# Patient Record
Sex: Female | Born: 1960 | Race: Black or African American | Hispanic: No | Marital: Single | Smoking: Never smoker
Health system: Southern US, Community
[De-identification: ages and names within clinical notes are randomized; demographics above are authoritative.]

## PROBLEM LIST (undated history)

## (undated) DIAGNOSIS — M5090 Cervical disc disorder, unspecified, unspecified cervical region: Secondary | ICD-10-CM

## (undated) DIAGNOSIS — M519 Unspecified thoracic, thoracolumbar and lumbosacral intervertebral disc disorder: Secondary | ICD-10-CM

## (undated) DIAGNOSIS — D649 Anemia, unspecified: Secondary | ICD-10-CM

## (undated) DIAGNOSIS — N185 Chronic kidney disease, stage 5: Secondary | ICD-10-CM

## (undated) DIAGNOSIS — Z992 Dependence on renal dialysis: Secondary | ICD-10-CM

## (undated) DIAGNOSIS — B182 Chronic viral hepatitis C: Secondary | ICD-10-CM

## (undated) DIAGNOSIS — I10 Essential (primary) hypertension: Secondary | ICD-10-CM

## (undated) DIAGNOSIS — N289 Disorder of kidney and ureter, unspecified: Secondary | ICD-10-CM

## (undated) HISTORY — PX: TUBAL LIGATION: SHX77

## (undated) HISTORY — DX: Chronic kidney disease, stage 5: N18.5

## (undated) HISTORY — DX: Cervical disc disorder, unspecified, unspecified cervical region: M50.90

## (undated) HISTORY — DX: Dependence on renal dialysis: Z99.2

## (undated) HISTORY — DX: Unspecified thoracic, thoracolumbar and lumbosacral intervertebral disc disorder: M51.9

---

## 1997-11-03 ENCOUNTER — Other Ambulatory Visit: Admission: RE | Admit: 1997-11-03 | Discharge: 1997-11-03 | Payer: Self-pay | Admitting: *Deleted

## 1998-05-09 ENCOUNTER — Other Ambulatory Visit: Admission: RE | Admit: 1998-05-09 | Discharge: 1998-05-09 | Payer: Self-pay | Admitting: Internal Medicine

## 1999-07-16 ENCOUNTER — Emergency Department (HOSPITAL_COMMUNITY): Admission: EM | Admit: 1999-07-16 | Discharge: 1999-07-16 | Payer: Self-pay | Admitting: Emergency Medicine

## 2000-05-07 ENCOUNTER — Inpatient Hospital Stay (HOSPITAL_COMMUNITY): Admission: AD | Admit: 2000-05-07 | Discharge: 2000-05-07 | Payer: Self-pay | Admitting: *Deleted

## 2001-03-02 ENCOUNTER — Emergency Department (HOSPITAL_COMMUNITY): Admission: EM | Admit: 2001-03-02 | Discharge: 2001-03-02 | Payer: Self-pay | Admitting: Emergency Medicine

## 2003-07-30 ENCOUNTER — Emergency Department (HOSPITAL_COMMUNITY): Admission: AD | Admit: 2003-07-30 | Discharge: 2003-07-30 | Payer: Self-pay | Admitting: Family Medicine

## 2003-08-02 ENCOUNTER — Emergency Department (HOSPITAL_COMMUNITY): Admission: AD | Admit: 2003-08-02 | Discharge: 2003-08-02 | Payer: Self-pay | Admitting: Family Medicine

## 2003-08-23 ENCOUNTER — Emergency Department (HOSPITAL_COMMUNITY): Admission: EM | Admit: 2003-08-23 | Discharge: 2003-08-23 | Payer: Self-pay | Admitting: Family Medicine

## 2004-03-10 ENCOUNTER — Emergency Department (HOSPITAL_COMMUNITY): Admission: EM | Admit: 2004-03-10 | Discharge: 2004-03-10 | Payer: Self-pay | Admitting: Emergency Medicine

## 2005-02-02 ENCOUNTER — Emergency Department (HOSPITAL_COMMUNITY): Admission: EM | Admit: 2005-02-02 | Discharge: 2005-02-02 | Payer: Self-pay | Admitting: Family Medicine

## 2005-03-01 ENCOUNTER — Encounter: Admission: RE | Admit: 2005-03-01 | Discharge: 2005-03-01 | Payer: Self-pay | Admitting: Nephrology

## 2005-03-01 ENCOUNTER — Other Ambulatory Visit: Admission: RE | Admit: 2005-03-01 | Discharge: 2005-03-01 | Payer: Self-pay | Admitting: Nephrology

## 2005-03-01 ENCOUNTER — Other Ambulatory Visit: Admission: RE | Admit: 2005-03-01 | Discharge: 2005-03-01 | Payer: Self-pay | Admitting: Cardiology

## 2005-07-26 ENCOUNTER — Encounter: Admission: RE | Admit: 2005-07-26 | Discharge: 2005-07-26 | Payer: Self-pay | Admitting: Nephrology

## 2005-08-02 ENCOUNTER — Encounter: Admission: RE | Admit: 2005-08-02 | Discharge: 2005-08-02 | Payer: Self-pay | Admitting: Nephrology

## 2006-04-10 ENCOUNTER — Encounter: Admission: RE | Admit: 2006-04-10 | Discharge: 2006-04-10 | Payer: Self-pay | Admitting: Internal Medicine

## 2006-04-24 ENCOUNTER — Encounter: Admission: RE | Admit: 2006-04-24 | Discharge: 2006-04-24 | Payer: Self-pay | Admitting: Internal Medicine

## 2007-02-25 ENCOUNTER — Encounter: Admission: RE | Admit: 2007-02-25 | Discharge: 2007-02-25 | Payer: Self-pay | Admitting: Internal Medicine

## 2007-03-08 ENCOUNTER — Emergency Department (HOSPITAL_COMMUNITY): Admission: EM | Admit: 2007-03-08 | Discharge: 2007-03-08 | Payer: Self-pay | Admitting: Family Medicine

## 2007-03-21 ENCOUNTER — Emergency Department (HOSPITAL_COMMUNITY): Admission: EM | Admit: 2007-03-21 | Discharge: 2007-03-21 | Payer: Self-pay | Admitting: Family Medicine

## 2007-09-22 ENCOUNTER — Emergency Department (HOSPITAL_COMMUNITY): Admission: EM | Admit: 2007-09-22 | Discharge: 2007-09-22 | Payer: Self-pay | Admitting: Family Medicine

## 2008-05-13 ENCOUNTER — Encounter: Admission: RE | Admit: 2008-05-13 | Discharge: 2008-05-13 | Payer: Self-pay | Admitting: Internal Medicine

## 2008-06-29 ENCOUNTER — Ambulatory Visit (HOSPITAL_COMMUNITY): Admission: RE | Admit: 2008-06-29 | Discharge: 2008-06-29 | Payer: Self-pay | Admitting: Obstetrics & Gynecology

## 2008-11-06 ENCOUNTER — Emergency Department (HOSPITAL_COMMUNITY): Admission: EM | Admit: 2008-11-06 | Discharge: 2008-11-06 | Payer: Self-pay | Admitting: Emergency Medicine

## 2008-11-30 ENCOUNTER — Encounter: Admission: RE | Admit: 2008-11-30 | Discharge: 2008-11-30 | Payer: Self-pay | Admitting: Internal Medicine

## 2009-06-03 ENCOUNTER — Encounter: Admission: RE | Admit: 2009-06-03 | Discharge: 2009-06-03 | Payer: Self-pay | Admitting: Internal Medicine

## 2010-04-21 ENCOUNTER — Encounter: Admission: RE | Admit: 2010-04-21 | Discharge: 2010-04-21 | Payer: Self-pay | Admitting: Family Medicine

## 2010-08-11 ENCOUNTER — Encounter
Admission: RE | Admit: 2010-08-11 | Discharge: 2010-08-11 | Payer: Self-pay | Source: Home / Self Care | Attending: Family Medicine | Admitting: Family Medicine

## 2010-08-13 ENCOUNTER — Encounter: Payer: Self-pay | Admitting: Internal Medicine

## 2010-08-31 ENCOUNTER — Ambulatory Visit: Payer: Self-pay | Admitting: Gastroenterology

## 2010-08-31 DIAGNOSIS — B182 Chronic viral hepatitis C: Secondary | ICD-10-CM

## 2010-09-01 ENCOUNTER — Other Ambulatory Visit: Payer: Self-pay | Admitting: Gastroenterology

## 2010-09-01 DIAGNOSIS — B1920 Unspecified viral hepatitis C without hepatic coma: Secondary | ICD-10-CM

## 2010-09-18 ENCOUNTER — Ambulatory Visit (HOSPITAL_COMMUNITY)
Admission: RE | Admit: 2010-09-18 | Discharge: 2010-09-18 | Disposition: A | Discharge status: Home / Self Care | Payer: Medicaid Other | Source: Ambulatory Visit | Attending: Gastroenterology | Admitting: Gastroenterology

## 2010-09-18 ENCOUNTER — Other Ambulatory Visit: Payer: Self-pay | Admitting: Interventional Radiology

## 2010-09-18 ENCOUNTER — Ambulatory Visit (HOSPITAL_COMMUNITY): Payer: Medicaid Other | Attending: Gastroenterology

## 2010-09-18 DIAGNOSIS — B1920 Unspecified viral hepatitis C without hepatic coma: Secondary | ICD-10-CM

## 2010-09-18 DIAGNOSIS — B182 Chronic viral hepatitis C: Secondary | ICD-10-CM | POA: Insufficient documentation

## 2010-09-18 LAB — CBC
HCT: 30.3 % — ABNORMAL LOW (ref 36.0–46.0)
MCH: 25.1 pg — ABNORMAL LOW (ref 26.0–34.0)
Platelets: 491 10*3/uL — ABNORMAL HIGH (ref 150–400)
RBC: 3.71 MIL/uL — ABNORMAL LOW (ref 3.87–5.11)
WBC: 7.5 10*3/uL (ref 4.0–10.5)

## 2010-09-18 LAB — PROTIME-INR: INR: 0.95 (ref 0.00–1.49)

## 2010-11-11 ENCOUNTER — Emergency Department (HOSPITAL_COMMUNITY): Admission: EM | Admit: 2010-11-11 | Payer: Self-pay | Source: Home / Self Care

## 2010-11-12 ENCOUNTER — Emergency Department (HOSPITAL_COMMUNITY)
Admission: EM | Admit: 2010-11-12 | Discharge: 2010-11-12 | Disposition: A | Discharge status: Home / Self Care | Payer: Medicaid Other | Attending: Emergency Medicine | Admitting: Emergency Medicine

## 2010-11-12 DIAGNOSIS — R3589 Other polyuria: Secondary | ICD-10-CM | POA: Insufficient documentation

## 2010-11-12 DIAGNOSIS — R631 Polydipsia: Secondary | ICD-10-CM | POA: Insufficient documentation

## 2010-11-12 DIAGNOSIS — H538 Other visual disturbances: Secondary | ICD-10-CM | POA: Insufficient documentation

## 2010-11-12 DIAGNOSIS — R358 Other polyuria: Secondary | ICD-10-CM | POA: Insufficient documentation

## 2010-11-12 DIAGNOSIS — I10 Essential (primary) hypertension: Secondary | ICD-10-CM | POA: Insufficient documentation

## 2010-11-12 DIAGNOSIS — R5383 Other fatigue: Secondary | ICD-10-CM | POA: Insufficient documentation

## 2010-11-12 DIAGNOSIS — E119 Type 2 diabetes mellitus without complications: Secondary | ICD-10-CM | POA: Insufficient documentation

## 2010-11-12 DIAGNOSIS — B1920 Unspecified viral hepatitis C without hepatic coma: Secondary | ICD-10-CM | POA: Insufficient documentation

## 2010-11-12 DIAGNOSIS — R5381 Other malaise: Secondary | ICD-10-CM | POA: Insufficient documentation

## 2010-11-12 LAB — COMPREHENSIVE METABOLIC PANEL
ALT: 44 U/L — ABNORMAL HIGH (ref 0–35)
AST: 48 U/L — ABNORMAL HIGH (ref 0–37)
Alkaline Phosphatase: 65 U/L (ref 39–117)
CO2: 25 mEq/L (ref 19–32)
Calcium: 9.5 mg/dL (ref 8.4–10.5)
Chloride: 91 mEq/L — ABNORMAL LOW (ref 96–112)
GFR calc Af Amer: 48 mL/min — ABNORMAL LOW (ref >60)
GFR calc non Af Amer: 39 mL/min — ABNORMAL LOW (ref >60)
Glucose, Bld: 734 mg/dL (ref 70–99)
Potassium: 3.8 mEq/L (ref 3.5–5.1)
Sodium: 128 mEq/L — ABNORMAL LOW (ref 135–145)

## 2010-11-12 LAB — URINALYSIS, ROUTINE W REFLEX MICROSCOPIC
Glucose, UA: >1000 mg/dL — AB
Leukocytes, UA: NEGATIVE
Protein, ur: 100 mg/dL — AB
pH: 6 (ref 5.0–8.0)

## 2010-11-12 LAB — DIFFERENTIAL
Basophils Absolute: 0.1 10*3/uL (ref 0.0–0.1)
Basophils Relative: 1 % (ref 0–1)
Lymphocytes Relative: 33 % (ref 12–46)
Monocytes Absolute: 0.5 10*3/uL (ref 0.1–1.0)
Neutro Abs: 3.5 10*3/uL (ref 1.7–7.7)
Neutrophils Relative %: 53 % (ref 43–77)

## 2010-11-12 LAB — GLUCOSE, CAPILLARY
Glucose-Capillary: 384 mg/dL — ABNORMAL HIGH (ref 70–99)
Glucose-Capillary: 303 mg/dL — ABNORMAL HIGH (ref 70–99)

## 2010-11-12 LAB — BLOOD GAS, VENOUS
Drawn by: 33686
O2 Saturation: 72.2 %
TCO2: 23.7 mmol/L (ref 0–100)
pCO2, Ven: 48 mmHg (ref 45.0–50.0)
pH, Ven: 7.348 — ABNORMAL HIGH (ref 7.250–7.300)

## 2010-11-12 LAB — CBC
HCT: 34.5 % — ABNORMAL LOW (ref 36.0–46.0)
Hemoglobin: 11.1 g/dL — ABNORMAL LOW (ref 12.0–15.0)
MCHC: 32.2 g/dL (ref 30.0–36.0)
RBC: 4.32 MIL/uL (ref 3.87–5.11)
WBC: 6.6 10*3/uL (ref 4.0–10.5)

## 2010-11-12 LAB — URINE MICROSCOPIC-ADD ON

## 2010-11-16 ENCOUNTER — Emergency Department (HOSPITAL_COMMUNITY)
Admission: EM | Admit: 2010-11-16 | Discharge: 2010-11-16 | Disposition: A | Discharge status: Home / Self Care | Payer: Medicaid Other | Attending: Emergency Medicine | Admitting: Emergency Medicine

## 2010-11-16 DIAGNOSIS — E119 Type 2 diabetes mellitus without complications: Secondary | ICD-10-CM | POA: Insufficient documentation

## 2010-11-16 DIAGNOSIS — I10 Essential (primary) hypertension: Secondary | ICD-10-CM | POA: Insufficient documentation

## 2010-11-16 DIAGNOSIS — Z8619 Personal history of other infectious and parasitic diseases: Secondary | ICD-10-CM | POA: Insufficient documentation

## 2010-11-16 LAB — BASIC METABOLIC PANEL
CO2: 27 mEq/L (ref 19–32)
Calcium: 9.7 mg/dL (ref 8.4–10.5)
Chloride: 90 mEq/L — ABNORMAL LOW (ref 96–112)
Potassium: 5.1 mEq/L (ref 3.5–5.1)
Sodium: 127 mEq/L — ABNORMAL LOW (ref 135–145)

## 2010-11-16 LAB — HEPATIC FUNCTION PANEL
Albumin: 3.4 g/dL — ABNORMAL LOW (ref 3.5–5.2)
Alkaline Phosphatase: 56 U/L (ref 39–117)
Bilirubin, Direct: <0.1 mg/dL (ref 0.0–0.3)
Total Bilirubin: 0.4 mg/dL (ref 0.3–1.2)

## 2010-11-16 LAB — GLUCOSE, CAPILLARY
Glucose-Capillary: >600 mg/dL (ref 70–99)
Glucose-Capillary: 422 mg/dL — ABNORMAL HIGH (ref 70–99)

## 2010-11-16 LAB — URINALYSIS, ROUTINE W REFLEX MICROSCOPIC
Hgb urine dipstick: NEGATIVE
Specific Gravity, Urine: 1.028 (ref 1.005–1.030)

## 2010-11-16 LAB — URINE MICROSCOPIC-ADD ON

## 2011-03-22 ENCOUNTER — Ambulatory Visit (INDEPENDENT_AMBULATORY_CARE_PROVIDER_SITE_OTHER): Payer: Self-pay | Admitting: Gastroenterology

## 2011-03-22 VITALS — BP 138/94 | HR 89 | Temp 99.5°F | Ht 64.0 in | Wt 166.0 lb

## 2011-03-22 DIAGNOSIS — B182 Chronic viral hepatitis C: Secondary | ICD-10-CM

## 2011-04-05 NOTE — Progress Notes (Signed)
NAMEMarland Kitchen  Maureen, Barnes  MR#:  [id number]      DATE:  03/22/2011  DOB:  10/18/1960    cc: Consulting Physician:  [facility], [address], [place], [place] [address], Fax [phone]  Primary Care Physician:  Wenda Low, MD, [facility] at [place], [address], [address], [place], [place] [address], [place] [phone]  Referring Physician:  Vicenta Aly, [place], [facility], [address], [place], [place], [place] [address], Fax [phone]    REASON FOR VISIT:  Follow up of genotype 1a hepatitis C.   History:  The patient returns today accompanied by her son. It will be recalled that when I saw her on 08/31/2010, she had genotype 1a hepatitis C. Biopsy on 09/18/2010, showed grade 2, stage I disease. Treatment had  been delayed because of a finding of a normocytic but hypochromic anemia. I suggested this be investigated by her primary physician at the time, Vicenta Aly. Today, the patient reports this was never  done. She is now switched to, Dr. Lysle Rubens at [facility]. She brings with her today a patient encounter summary from 03/06/2011, at which time CBC was checked, and she was suppose to be on iron 325 mg p.o. b.i.d. at the time. We do not have that CBC as of yet. In terms of the etiology, the patient acknowledges a history of menorrhagia. She is due to be seen at the [facility] for her gynecologic care at which time she can discuss the menorrhagia with them.  There are currently no symptoms referable to her history of hepatitis C. There are no symptoms to suggest cryoglobulin mediated or decompensated liver disease.   PAST MEDICAL HISTORY:  Other than the anemia, the patient reports that she has been diagnosed with diabetes since last being seen. She has been started on Lantus  insulin. She reports she checks her blood sugars twice daily and they range between 80 and 100 fasting.    CURRENT MEDICATIONS:  Nasonex 2 sprays in each nostril once daily, Clarinex 5 mg p.o. daily, Lantus insulin 25-50 units subcu daily depending on blood sugars, Diovan HCT 320/25 mg p.o. daily, amlodipine 10 mg p.o. daily,  cyclobenzaprine 5 mg p.o. at bedtime, iron 325 mg (365 mg elemental iron) b.i.d., ibuprofen 200 mg p.o. q. 6 hours p.r.n.   ALLERGIES:  Iodine and shellfish with shellfish causing hives.   HABITS:  Smoking, quit over 2-1/2 years ago. Alcohol denies interval consumption.   REVIEW OF SYSTEMS:  All 10 systems reviewed today with the patient and they are negative other which mentioned above. Her CES-D was 34.   PHYSICAL EXAMINATION:  Constitutional: Well-appearing. Vital signs: Height 64 inches, weight 166 pounds, down from 180 pounds on 08/31/2010. Blood pressure 138/94, pulse of 89, temperature 99.5 Fahrenheit.  Ears, nose, mouth and  throat:  Unremarkable oropharynx.  No thyromegaly or neck masses.  Chest:  Resonant to percussion.  Clear to auscultation.  Cardiovascular:  Heart sounds normal S1, S2 without murmurs or rubs.   There is no peripheral edema.  Abdominal:  Normal bowel sounds.  No masses or tenderness.  I could not appreciate a liver edge or spleen tip.  I could not appreciate any hernias. There was slight amount of  right upper quadrant tenderness. There was no rebound or guarding, and Murphy sign was negative.  Lymphatics:  No cervical or inguinal lymphadenopathy.  Central Nervous System:  No asterixis or focal  neurologic findings.  Dermatologic:  Anicteric without palmar erythema or spider angiomata.  Eyes:  Anicteric sclerae.  Pupils are equal and reactive to light.   LABORATORY STUDIES:  From 03/06/2011, faxed after clinic appointment showed a white count of 5.9, hemoglobin 9.6, MCV 87.2, MCH 28, MCHC 32, and a platelet count of 381, creatinine was 1.17, albumin 3.7. ALT 246, AST 57, ALP  41, total bilirubin 0.3. Her triglycerides were 113. Urine  microalbumin to creatinine ratio was 1100.   ASSESSMENT:  The patient is a 50 year old woman with a history of genotype 1a hepatitis C with a biopsy on 09/18/2010, showing grade 2, stage I disease. She is a good candidate for treatment with the exception of  the anemia, which is now more of a normocytic normochromic anemia, though she reports a history of menorrhagia.  I cannot treat her for hepatitis C without  resolution of this because there is a risk of profound anemia from the antivirals that I will be using to treat her hepatitis C particular the protease inhibitor. If the evaluation by gynecology turns out to  be negative she will need a colonoscopy. I do not do colonoscopies in [place] as I only see viral hepatitis patients in [place]. She would need to be referred to [facility] for an endoscopy or I can do  this done in [facility], if requested by her primary physician.  In my discussion today with the patient, I have discussed her previous lab results including her biopsy findings. We discussed the implications of this. We then discussed treatment with triple therapy  of PEG interferon, ribavirin, and a protease inhibitor. I have explained to her why I would need to have her anemia resolved before we can go forward. She understood this. I have also explained to her  that if she sees gynecology and there is a discussion about using hormonal therapy to manipulate her menstrual bleeding, there would be no restriction on what they can choose based on her liver disease.   PLAN:  1. She is to return in approximately 2-4 month's time in follow up to track her progress in terms of her other comorbidities, and see if we can consider treating her for hepatitis C. 2. She will need hepatitis A vaccination through her primary. 3. Hepatitis B immune.            Marty Heck, MD   [phone]  D:  Thu Aug 30 18:30:47 2012 ; T:  Thu Aug 30 21:23:36 2012  Job #:  [id number]

## 2011-05-03 ENCOUNTER — Other Ambulatory Visit (HOSPITAL_COMMUNITY): Payer: Self-pay | Admitting: Internal Medicine

## 2011-05-03 DIAGNOSIS — R51 Headache: Secondary | ICD-10-CM

## 2011-05-03 DIAGNOSIS — J3489 Other specified disorders of nose and nasal sinuses: Secondary | ICD-10-CM

## 2011-05-04 ENCOUNTER — Ambulatory Visit (HOSPITAL_COMMUNITY)
Admission: RE | Admit: 2011-05-04 | Discharge: 2011-05-04 | Disposition: A | Discharge status: Home / Self Care | Payer: Self-pay | Source: Ambulatory Visit | Attending: Internal Medicine | Admitting: Internal Medicine

## 2011-05-04 DIAGNOSIS — R51 Headache: Secondary | ICD-10-CM | POA: Insufficient documentation

## 2011-05-04 DIAGNOSIS — J3489 Other specified disorders of nose and nasal sinuses: Secondary | ICD-10-CM

## 2011-05-04 LAB — WET PREP, GENITAL
Trich, Wet Prep: NONE SEEN
Yeast Wet Prep HPF POC: NONE SEEN

## 2011-05-04 LAB — POCT URINALYSIS DIP (DEVICE)
Glucose, UA: NEGATIVE
Ketones, ur: NEGATIVE
Operator id: 282151
Specific Gravity, Urine: >=1.03

## 2011-05-04 LAB — POCT PREGNANCY, URINE
Operator id: 282151
Preg Test, Ur: NEGATIVE

## 2011-05-04 LAB — URINE CULTURE: Culture: NO GROWTH

## 2011-05-04 LAB — GC/CHLAMYDIA PROBE AMP, GENITAL: GC Probe Amp, Genital: NEGATIVE

## 2011-06-28 ENCOUNTER — Ambulatory Visit (INDEPENDENT_AMBULATORY_CARE_PROVIDER_SITE_OTHER): Payer: Self-pay | Admitting: Gastroenterology

## 2011-06-28 DIAGNOSIS — B182 Chronic viral hepatitis C: Secondary | ICD-10-CM

## 2011-06-28 MED ORDER — BOCEPREVIR 200 MG PO CAPS: 800.0000 mg | ORAL_CAPSULE | Freq: Three times a day (TID) | ORAL | Status: DC

## 2011-06-29 LAB — HEPATIC FUNCTION PANEL
Albumin: 3.6 g/dL (ref 3.5–5.2)
Total Bilirubin: 0.3 mg/dL (ref 0.3–1.2)
Total Protein: 6.9 g/dL (ref 6.0–8.3)

## 2011-06-29 LAB — CBC WITH DIFFERENTIAL/PLATELET
Basophils Absolute: 0.1 10*3/uL (ref 0.0–0.1)
Basophils Relative: 1 % (ref 0–1)
HCT: 36.4 % (ref 36.0–46.0)
Hemoglobin: 11.5 g/dL — ABNORMAL LOW (ref 12.0–15.0)
Lymphocytes Relative: 31 % (ref 12–46)
MCHC: 31.6 g/dL (ref 30.0–36.0)
Monocytes Absolute: 0.5 10*3/uL (ref 0.1–1.0)
Monocytes Relative: 7 % (ref 3–12)
Neutro Abs: 4 10*3/uL (ref 1.7–7.7)
Neutrophils Relative %: 53 % (ref 43–77)
RDW: 15.2 % (ref 11.5–15.5)
WBC: 7.5 10*3/uL (ref 4.0–10.5)

## 2011-07-01 ENCOUNTER — Emergency Department (HOSPITAL_COMMUNITY)
Admission: EM | Admit: 2011-07-01 | Discharge: 2011-07-01 | Disposition: A | Discharge status: Home / Self Care | Payer: Self-pay | Source: Home / Self Care | Attending: Family Medicine | Admitting: Family Medicine

## 2011-07-01 ENCOUNTER — Encounter: Payer: Self-pay | Admitting: Cardiology

## 2011-07-01 DIAGNOSIS — B9689 Other specified bacterial agents as the cause of diseases classified elsewhere: Secondary | ICD-10-CM

## 2011-07-01 DIAGNOSIS — N760 Acute vaginitis: Secondary | ICD-10-CM

## 2011-07-01 DIAGNOSIS — A499 Bacterial infection, unspecified: Secondary | ICD-10-CM

## 2011-07-01 HISTORY — DX: Chronic viral hepatitis C: B18.2

## 2011-07-01 HISTORY — DX: Anemia, unspecified: D64.9

## 2011-07-01 HISTORY — DX: Essential (primary) hypertension: I10

## 2011-07-01 LAB — WET PREP, GENITAL
Trich, Wet Prep: NONE SEEN
Yeast Wet Prep HPF POC: NONE SEEN

## 2011-07-01 LAB — POCT URINALYSIS DIP (DEVICE)
Bilirubin Urine: NEGATIVE
Ketones, ur: NEGATIVE mg/dL
Leukocytes, UA: NEGATIVE
Specific Gravity, Urine: 1.025 (ref 1.005–1.030)

## 2011-07-01 MED ORDER — METRONIDAZOLE 0.75 % VA GEL: VAGINAL | Status: DC

## 2011-07-01 NOTE — ED Notes (Signed)
Pt reports frequency, urgency and burning on urination for the past 3 weeks. Denies fever. Denies nausea/vomiting. Lower abd pain. Vaginal discharge yellow/brown milky type.

## 2011-07-01 NOTE — ED Provider Notes (Signed)
History     CSN: [id number] Arrival date & time: 07/01/2011 12:40 PM   First MD Initiated Contact with Patient 07/01/11 1158      Chief Complaint  Patient presents with  . Urinary Tract Infection    (Consider location/radiation/quality/duration/timing/severity/associated sxs/prior treatment) Patient is a 50 y.o. female presenting with frequency.  Urinary Frequency This is a new problem. The current episode started more than 1 week ago. The problem occurs constantly. The problem has not changed since onset.Associated symptoms comments: Vag disch.    Past Medical History  Diagnosis Date  . Diabetes mellitus   . Hypertension   . Anemia   . Hep C w/o coma, chronic     History reviewed. No pertinent past surgical history.  History reviewed. No pertinent family history.  History  Substance Use Topics  . Smoking status: Never Smoker   . Smokeless tobacco: Not on file  . Alcohol Use: No    OB History    Grav Para Term Preterm Abortions TAB SAB Ect Mult Living                  Review of Systems  Constitutional: Negative.   Gastrointestinal: Negative.   Genitourinary: Positive for dysuria, urgency, frequency and vaginal discharge.    Allergies  Iodine  Home Medications   Current Outpatient Rx  Name Route Sig Dispense Refill  . AMLODIPINE BESYLATE 10 MG PO TABS Oral Take 10 mg by mouth daily.      Marland Kitchen LANTUS SOLOSTAR [place] Subcutaneous Inject 31 Units into the skin daily.     Marland Kitchen LORATADINE 10 MG PO TABS Oral Take 10 mg by mouth daily.      Marland Kitchen VALSARTAN-HYDROCHLOROTHIAZIDE 320-25 MG PO TABS Oral Take 1 tablet by mouth daily.      Marland Kitchen BOCEPREVIR 200 MG PO CAPS Oral Take 800 mg by mouth every 8 (eight) hours. 360 capsule 7  . METRONIDAZOLE A999333 % VA GEL  1 applic intravag qhs for 5 nights 70 g 0    BP 137/90  Pulse 89  Temp(Src) 98.1 F (36.7 C) (Oral)  Resp 18  SpO2 98%  LMP 06/01/2011  Physical Exam  Nursing note and vitals reviewed. Constitutional: She appears  well-developed and well-nourished.  Abdominal: Soft. Bowel sounds are normal.  Genitourinary: Uterus is deviated. Uterus is not tender. Cervix exhibits discharge. Cervix exhibits no motion tenderness. Right adnexum displays no mass and no tenderness. Left adnexum displays no mass and no tenderness. No erythema, tenderness or bleeding around the vagina. No signs of injury around the vagina. Vaginal discharge found.    ED Course  Procedures (including critical care time)  Labs Reviewed  POCT URINALYSIS DIP (DEVICE) - Abnormal; Notable for the following:    Protein, ur 100 (*)    All other components within normal limits  POCT URINALYSIS DIPSTICK   No results found.   1. Bacterial vaginosis       MDM  U/a neg.        Pauline Good, MD 07/01/11 [phone]

## 2011-07-02 LAB — GC/CHLAMYDIA PROBE AMP, GENITAL
Chlamydia, DNA Probe: NEGATIVE
GC Probe Amp, Genital: NEGATIVE

## 2011-07-02 NOTE — ED Notes (Signed)
Labs and medications for 12/9 reviewed. Pt. adequately treated with Metrogel for clue cells. GC/Chlamydia pending. Roselyn Meier 07/02/2011

## 2011-07-03 NOTE — ED Notes (Signed)
GC/Chlamydia neg.  No further action needed. Maureen Barnes 07/03/2011

## 2011-07-04 LAB — HEPATITIS C RNA QUANTITATIVE
HCV Quantitative Log: 5.87 {Log} — ABNORMAL HIGH (ref <1.63)
HCV Quantitative: 736000 IU/mL — ABNORMAL HIGH (ref <43)

## 2011-07-05 NOTE — Progress Notes (Signed)
NAMEMarland Kitchen  Maureen, Barnes  MR#:  [id number]      DATE:  06/28/2011  DOB:  12-28-1960    cc: Consulting Physician:  [facility], [address], [place], [place] [address], Fax [phone] Primary Care Physician:  Namon Cirri, MD, [facility] at [place], [address], [place], [place], [place] [address], [place] [phone] Referring Physician:  Vicenta Aly, [place], [facility], [address], [place], [place], [place] [address], Fax [phone]    REASON FOR VISIT:  Follow up of genotype 1a hepatitis C, IL 28 b CT.   History:  The patient returns today unaccompanied. Since last being seen on 03/22/2011, it will be recalled that treatment for hepatitis C was held up because of the finding of anemia. It was attributed to iron  deficiency from menorrhagia. The patient reports that she is on iron supplement, as well as a multivitamin with improvement in her hemoglobin. She believes that around 06/02/2011, her hemoglobin was  11, although her menorrhagia continues. Otherwise, there are no symptoms referable to her history of hepatitis C nor symptoms to suggest cryoglobulin mediated or decompensated liver disease.   Past MEDICAL HISTORY:  Otherwise significant for type 2 diabetes. She is maintained on Lantus insulin. Checks her blood sugars twice daily. Her blood sugar usually range between 800 to 100 first thing in the morning.   CURRENT MEDICATIONS:  Lantus insulin 31 units subcu a.m., Diovan/hydrochlorothiazide 320/25 mg daily, amlodipine 10 mg daily, cyclobenzaprine 5 mg at bedtime,  iron 325 mg b.i.d., ibuprofen 200 mg q. 6 hours p.r.n., Nasonex to 2 sprays into each nostril daily, Clarinex 5 mg daily.   ALLERGIES:  Iodine and shellfish, which shellfish causes hives.   HABITS:  Smoking, quit over 3 years ago. Alcohol, denies interval consumption.   REVIEW OF SYSTEMS:  All 10 systems reviewed today with the  patient and they are negative other than which is mentioned above. CES-D was 26.   PHYSICAL EXAMINATION:   Constitutional:  Well-appearing without significant peripheral wasting or stigmata of chronic liver disease. Vital signs: Height 64  inches, weight 169 pounds, blood pressure 138/94, pulse of 86, temperature 98.5 Fahrenheit.   Laboratories:  I have not received any lab work since last being seen.   ASSESSMENT:  Patient is a 50 year old woman with history of genotype 1a hepatitis C, IL 28 b CT with a biopsy in February 2005 showing grade 2 stage I disease. I had previously thought she was a good candidate for  treatment with exception of anemia. If this has truly resolved, we can proceed with treatment. Perhaps though if her hemoglobin is around 11, boceprevir would be a better choice because of less anemia.  In my discussion today with the patient, we discussed her previous lab testing, and her reports that the anemia may have resolved to the point we can start her on treatment. We discussed treatment with  pegylated interferon, ribavirin, and telaprevir versus boceprevir weighing the risks and benefit of each. We discussed the possible use of boceprevir. Because of her lack of insurance, I have explained to  her that she must complete the patient assistance forms for PEG interferon, and ribavirin, and boceprevir and could take over month  for reply from accompanies that adjudicate these applications for free medications.   PLAN:  1. Check CBC, as well as HCV RNA and liver enzymes today. 2. Hepatitis A naive, she should be vaccinated through her primary physician,  and hepatitis B immune. 3. She has completed her sections of the applications for medication assistance for boceprevir, Pegasys, and ribavirin. I will complete my section and fax these off. 4. She was instructed that if approved, once medications are delivered to her home she is to contact us for an appointment for teaching.              Marty Heck, MD  ADDENDUM HgB 11.5   HCV RNA 736000 IU/mL Victrelis approved.  Pegasys/RBV pending.   [place]  D:  Thu Dec 06 16:28:23 2012 ; T:  Thu Dec 06 17:08:16 2012  Job #:  [id number]

## 2011-07-12 ENCOUNTER — Telehealth: Payer: Self-pay | Admitting: Gastroenterology

## 2011-07-12 ENCOUNTER — Ambulatory Visit (INDEPENDENT_AMBULATORY_CARE_PROVIDER_SITE_OTHER): Payer: Self-pay | Admitting: Gastroenterology

## 2011-07-12 DIAGNOSIS — B182 Chronic viral hepatitis C: Secondary | ICD-10-CM

## 2011-07-19 NOTE — Progress Notes (Signed)
NAMEMarland Kitchen  Maureen Barnes, Maureen Barnes  MR#:  [id number]      DATE:  07/12/2011  DOB:  20-Nov-1960    cc: Consulting Physician:  [facility], [address], [place], [place] [address], Fax [phone] Primary Care Physician:  Namon Cirri, MD, [facility] at [place],  [place], [address], [place], [address] [address], [place] [phone] Referring Physician:  Nonda Lou, FNP, [facility], [place], [place], [place],  [address], Fax [phone]    REASON FOR VISIT:  Follow up of genotype 1a hepatitis C, IL 28b CT.   History:  The patient returns today accompanied by her family. She comes in today for teaching to commence on Pegasys, ribavirin, and boceprevir. Since last being seen the patient has not had any new symptoms  referable to her history of hepatitis C. There are no symptoms to suggest cryoglobulin mediated or decompensated liver disease. Her  periods are unchanged from before, which is thought to be the source of her anemia.   PAST MEDICAL HISTORY:  Otherwise significant for type 2 diabetes for which she takes Lantus insulin.    Current medications:  Lantus insulin 31 units subcu q.a.m., Diovan/hydrochlorothiazide 320/25 mg daily, amlodipine 10 mg daily, cyclobenzaprine 5 mg at  bedtime, iron 325 mg b.i.d., ibuprofen 200 mg q. 6 hours p.r.n., Nasonex 2 sprays each nostril daily, Clarinex 5 mg daily.   ALLERGIES:  Iodine and shellfish. Shellfish causes hives.   HABITS:  Smoking, quit over 3 years ago. Alcohol, denies interval consumption.   REVIEW OF SYSTEMS:  All 10 systems reviewed today with the patient and they are negative other than which is mentioned in the chart. CES-D was 25.   PHYSICAL EXAMINATION:   Constitutional:  Well appearing. Vital signs: Height 64 inches, weight 169 pounds. She estimates that without clothes her weight would  be 167 pounds, which is  weight of 75.9 kg. Blood  pressure 126/85, pulse of 89, temperature 98.4 Fahrenheit.   ASSESSMENT:  The patient is a 50 year old woman with history of genotype 1a hepatitis C, IL 28B CT, with a biopsy in February 2005 showing grade 2 stage I disease. She comes in today for teaching to commence treatment. Her start date for Pegasys and ribavirin will be 07/13/2011. She will need to commence boceprevir 4 weeks later. It appears that her menorrhagia induced anemia has resolved to a significant degree, so I do think it would be safe to start treating her.  In my discussion today with the patient, I demonstrated the use of the Pegasys preloaded syringe. I also demonstrated the dosing of ribavirin. We discussed the dosing of boceprevir, as well. I have  given her the [employer] information material on the use of boceprevir. We discussed the side effects of all treatment medications and the management thereof. We discussed hydration and careful use of NSAIDs  for around the time of the injections. The importance of followup for lab testing was also discussed.   PLAN:  1. Start Pegasys and ribavirin tomorrow. For the purposes of treatment, her baseline HCV RNA will be 736,000 international units per mL. 2. I have asked her to increase the iron to t.i.d. from b.i.d. 3. She is hepatitis A naive and should be vaccinated through her primary physician. 4. She is hepatitis B immune. 5. Will see her again in 2 weeks at week 2 of therapy.            Marty Heck,  MD   [phone]  D:  Thu Dec 20 12:40:45 2012 ; T:  Thu Dec 20 15:27:04 2012  Job #:  [id number]

## 2011-07-26 ENCOUNTER — Ambulatory Visit (INDEPENDENT_AMBULATORY_CARE_PROVIDER_SITE_OTHER): Payer: Self-pay | Admitting: Gastroenterology

## 2011-07-26 DIAGNOSIS — B182 Chronic viral hepatitis C: Secondary | ICD-10-CM

## 2011-07-26 NOTE — Progress Notes (Signed)
   cc: Consulting Physician:  [facility], [address], [place], [place] [address], Fax [phone]  Primary Care Physician:  Namon Cirri, MD, [facility] Medicine at [place],  [place], [address], [place], [place] [address], [place] [phone]  Referring Physician:  Nonda Lou, FNP, [facility], [place], [place], [place], [place] [address], Fax [phone]    REASON FOR VISIT:  Follow up of genotype 1a hepatitis C, IL 28b CT, week 2 of therapy with Pegasys/RBV/boceprevir.   History:  The patient returns today unaccompanied.  Her start date for Pegasys and ribavirin was 07/13/11.  She will take her third Pegasys injection tomorrow.  She complains of myalgias and fatigue.   She is using ibuprofen for the myalgias.  Ms. Lavey asks for a letter related to Section 8 housing allowing her to have all her living accommodations on one floor so she does not have to climb steps. There are no symptoms to suggest cryoglobulin mediated or decompensated liver disease.  PAST MEDICAL HISTORY:  Otherwise significant for type 2 diabetes.  She is having episodes of hypoglycemia due to not eating well when she takes her Pegasys.    Current medications:  Lantus insulin 31 units subcu q.a.m., Diovan/hydrochlorothiazide 320/25 mg daily, amlodipine 10 mg daily, cyclobenzaprine 5 mg at bedtime, iron 325 mg t.i.d., ibuprofen 200 mg q. 6 hours p.r.n., Nasonex 2 sprays each nostril daily, Clarinex 5 mg daily, Pegasys 180 mcg s.q. weekly,  ribavirin 600 mg b.i.d.     ALLERGIES:  Iodine and shellfish. Shellfish causes hives.   HABITS:  Smoking, quit over 3 years ago. Alcohol, denies interval consumption.   REVIEW OF SYSTEMS:  All 10 systems reviewed today with the patient and they are negative other than which is mentioned in the chart. CES-D was 21.   PHYSICAL EXAMINATION:   Constitutional:  Well appearing. Vital signs: Height 64  inches, weight 169 pounds,  blood pressure 129/93, pulse of 91, temperature 98.0 Fahrenheit.   ASSESSMENT:  The patient is a 51 year old woman with history of genotype 1a hepatitis C, IL 28B CT, with a biopsy in February 2005 showing grade 2 stage I disease.   Her baseline HCV RNA will be 736,000 IU/mL.  Her start date for Pegasys and ribavirin was 07/13/2011. She will commence boceprevir 4 weeks later. Her myalgias are typical of those from Pegasys.  In my discussion today with the patient, I discussed side effect management, timing of her labs, and timing of starting boceprevir.  PLAN:  1. CBC with diff today. 2. She is hepatitis A naive and should be vaccinated through her primary physician. 3. She is hepatitis B immune. 4. Will see her again in 2 weeks at week 4 of therapy, at which time she will need an HCV RNA and start on boceprevir.            Marty Heck, MD

## 2011-07-27 LAB — CBC WITH DIFFERENTIAL/PLATELET
Basophils Relative: 0 % (ref 0–1)
Eosinophils Absolute: 0.2 10*3/uL (ref 0.0–0.7)
Eosinophils Relative: 5 % (ref 0–5)
Lymphs Abs: 1.3 10*3/uL (ref 0.7–4.0)
MCH: 30.1 pg (ref 26.0–34.0)
MCHC: 32.2 g/dL (ref 30.0–36.0)
MCV: 93.5 fL (ref 78.0–100.0)
Neutrophils Relative %: 39 % — ABNORMAL LOW (ref 43–77)
Platelets: 287 10*3/uL (ref 150–400)
RBC: 3.52 MIL/uL — ABNORMAL LOW (ref 3.87–5.11)
RDW: 14.3 % (ref 11.5–15.5)

## 2011-08-09 ENCOUNTER — Ambulatory Visit: Payer: Self-pay | Admitting: Gastroenterology

## 2011-08-16 ENCOUNTER — Ambulatory Visit (INDEPENDENT_AMBULATORY_CARE_PROVIDER_SITE_OTHER): Payer: Self-pay | Admitting: Gastroenterology

## 2011-08-16 DIAGNOSIS — B182 Chronic viral hepatitis C: Secondary | ICD-10-CM

## 2011-08-17 LAB — CBC WITH DIFFERENTIAL/PLATELET
Basophils Absolute: 0 10*3/uL (ref 0.0–0.1)
Eosinophils Absolute: 0.1 10*3/uL (ref 0.0–0.7)
Eosinophils Relative: 3 % (ref 0–5)
Lymphocytes Relative: 43 % (ref 12–46)
MCH: 30.1 pg (ref 26.0–34.0)
MCV: 100.9 fL — ABNORMAL HIGH (ref 78.0–100.0)
Neutrophils Relative %: 35 % — ABNORMAL LOW (ref 43–77)
Platelets: 414 10*3/uL — ABNORMAL HIGH (ref 150–400)
RDW: 14.9 % (ref 11.5–15.5)
WBC: 3.4 10*3/uL — ABNORMAL LOW (ref 4.0–10.5)

## 2011-08-17 LAB — HEPATIC FUNCTION PANEL
ALT: 24 U/L (ref 0–35)
AST: 34 U/L (ref 0–37)
Bilirubin, Direct: 0.1 mg/dL (ref 0.0–0.3)
Total Protein: 6.7 g/dL (ref 6.0–8.3)

## 2011-08-23 NOTE — Progress Notes (Signed)
NAMEMarland Kitchen  [place], [place]  MR#:  [id number]      DATE:  08/16/2011  DOB:  01-Jun-1961    cc: Consulting Physician: [facility], [address], [place], [place] [address], Fax [phone]  Primary Care Physician: Namon Cirri, MD, [facility] Internal Medicine at [place], [place], [address], [place], [place] [address], [place] [phone]   Referring Physician: Nonda Lou, FNP, [facility], [place], [place], [place], [place] [address], Fax [phone]    REASON FOR VISIT:  Follow up of genotype 1a hepatitis C, IL-28B TT, week 4 of therapy with Pegasys/ribavirin/beceprevir.   History:  The patient returns today unaccompanied. Her start date for Pegasys and ribavirin was 07/13/2011. She will take her fifth injection of Pegasys on Monday, having switched her dosing to Monday. She has no  specific complaints today. She has received her boceprevir and will start that when she starts her fifth injection of Pegasys with  ribavirin. There are no symptoms to suggest cryoglobulin mediated or decompensated liver disease.   Past medical history:  Type 2 diabetes. She reports no problems with her blood sugars and they range between 80 and 100. The patient has recently had an upper respiratory tract infection for which she was seen by her primary  physician, treated with a combination of doxycycline. When this did not improve, she was given Mucinex and amoxicillin. She has yet to start amoxicillin.    CURRENT MEDICATIONS:  Lantus insulin 31 units subcu q.a.m., Diovan/hydrochlorothiazide 320/25 mg daily, amlodipine 10 mg daily, cyclobenzaprine 5 mg at bedtime, iron 325 mg t.i.d., ibuprofen 200 mg q. 6 hours p.r.n.,  Nasonex 2 inhalations to each nostril daily, Mucinex currently and amoxicillin due to start soon, Pegasys 180 mcg subcu weekly, ribavirin 600 mg b.i.d.   ALLERGIES:  Iodine and shellfish, with shellfish causing hives.    HABITS:  Smoking, quit over 3 years ago. Alcohol, denies interval consumption.   REVIEW OF SYSTEMS:  All 10 systems reviewed today with the patient and they are negative other than which is mentioned above. CES-D was 28.    PHYSICAL EXAMINATION:   Constitutional:  Well-appearing. Vital signs: Height 64 inches, weight 170 pounds, blood pressure 145/100, pulse of 89, temperature 98.2 Fahrenheit.   ASSESSMENT:  The patient is a 51 year old woman with a history of genotype 1a hepatitis C, IL-28 B CT with a biopsy in February 2005, showing grade 2 stage I disease. Her baseline HCV RNA was 736,000 international  units per mL. Her start date for Pegasys and ribavirin was 07/13/2011. She will commence boceprevir on 08/20/2011. She is due for viral load at 4 weeks into therapy.  Today we discussed the timing of the introduction of boceprevir, and the importance of getting an HCV RNA today. We also discussed side effect management of the boceprevir should she develop the rash that  is usually attributed to this. We discussed the use of topical steroids and over the counter steroids, as well as antihistamines.   plan:  1. CBC and liver enzymes today. 2. Week 4 HCV RNA today. 3. She is hepatitis A naive and should be vaccinated through her primary physician. 4. Hepatitis B immune. 5. She will introduce boceprevir on 08/20/2011. 6. She will return in 2 weeks' time for follow up.            Marty Heck, MD   Addendum: Week 4 HCV RNA [id number] U/mL  Baseline 736000 IU/mL (2.5 log decline)  HgB 9.8 will hold on dose reduction of ribavirin until well into boceprevir.    [place]  D:  Thu Jan 24 18:52:39 2013 ; T:  Thu Jan 24 20:58:03 2013  Job #:  [id number]

## 2011-08-30 ENCOUNTER — Ambulatory Visit (INDEPENDENT_AMBULATORY_CARE_PROVIDER_SITE_OTHER): Payer: Self-pay | Admitting: Gastroenterology

## 2011-08-30 DIAGNOSIS — B182 Chronic viral hepatitis C: Secondary | ICD-10-CM

## 2011-09-06 NOTE — Progress Notes (Signed)
NAME:  Maureen Barnes, Maureen Barnes  MR#:  [id number]      DATE:  08/30/2011  DOB:  11/15/1960    cc: Maureen Cirri, MD, [facility] at [place], [address] [place], [place], [place], [address], fax number [phone]. [facility], [address], [place], [place], [address], fax number [phone]. Nonda Lou FNP, [facility], [address] [place], [place], [place] [address], fax number [phone].    Reason for followup:  Follow up of genotype 1A hepatitis C, IL 28 B TT, week 8 of treatment with Pegasys, ribavirin and boceprevir.   History:  The patient returns today unaccompanied. Her start date for Pegasys ribavirin was 07/13/2011. She started on boceprevir around 08/17/2011. There may be some error in the dates because when I recalculated  today, it matches with the report that she is due to take her 8th injection of Pegasys tomorrow. She is complaining of nausea, headaches, fatigue and pruritus with a slight erythematous eruption.  She reports; however, that she is using peppermint tea for the nausea, using NSAIDs for the headaches and using topical over-the-counter  cortisone for the rash and does not want any further pharmacotherapy for these.   PAST MEDICAL HISTORY:  Significant for type 2 diabetes.   CURRENT MEDICATIONS:  Lantus insulin 31 units subcu  in the a.m., Diovan/hydrochlorothiazide 320/25 mg daily, amlodipine 10 mg daily, cyclobenzaprine 5 mg at bedtime, iron 325 mg t.i.d., ibuprofen 20 mg every 6 hours p.r.n.,  Nasonex 2 inhalations each nostril daily, Pegasys 180 mcg subcu weekly, ribavirin 2 600 mg b.i.d., boceprevir 800 mg every 8h.   ALLERGIES:  Iodine and shellfish with the shellfish causing hives.    habits:  Smoking: Quit over 3 years ago. Alcohol: Denies interval consumption.   REVIEW OF SYSTEMS:  All 10 systems reviewed today with [name] and they are negative other than which  was mentioned above.  Her CES-D was 31.   PHYSICAL examination:  Constitutional:   Well appearing. Vital signs: Height is 64, inches, weight 172 pounds up 2 pounds from previously. Blood pressure 129/95, pulse 96, temperature 98.3 Fahrenheit.  Ears, nose, mouth and throat:   Unremarkable oropharynx.  No thyromegaly or neck masses.  Chest:  Resonant to percussion.  Clear to auscultation.  Cardiovascular:  Heart sounds normal S1, S2 without murmurs or rubs.  There is no  peripheral edema.  Abdominal:  Normal bowel sounds.  No masses or tenderness.  I could not appreciate a liver edge or spleen tip.  I could not appreciate any hernias.  Lymphatics:  No cervical or  inguinal lymphadenopathy.  Central Nervous System:  No asterixis or focal neurologic findings.  Dermatologic:  Anicteric without palmar  erythema or spider angiomata.  Eyes:  Anicteric sclerae.  Pupils are equal and reactive to light.   ASSESSMENT:  Patient is a 51 year old woman with history of genotype 1A hepatitis C, IL 28 B CT with a biopsy in 08/2003 showing grade 2, stage I disease. Baseline HCV RNA was 736,000 international units per mL. Her  week 4 HCV RNA had fallen on just Pegasys and ribavirin to 736,000 international units per mL with 2.5 log decline. Her start date for Pegasys and ribavirin was 07/13/2011. She is due to take her 8th  injection tomorrow and she commenced her boceprevir on 08/20/2011.  Today we discussed the timing of her next HCV RNA which should be done next week rather than this  week so that it will not be done prematurely and if still positive compromise her treatment.  In my discussion today with the patient,  I discussed the nature and natural history of HCV.  We discussed the role of a liver biopsy for genotype 1 HCV, if the patient is genotype 1 HCV.  We discussed  treatment with pegylated interferon and ribavirin.  I discussed response rates and our treatment protocol for our clinic.  I reviewed the  specific systems, constitutional, and psychiatric side effects of  therapy.  I emphasized any potential side effects to reflect the patient's past medical history.  I have told the patient that she  must not share objects exposed to blood such as toothbrushes and  razors.  I told the patient that HCV is so rarely sexually transmitted such that it would not require changing sexual practices.  I have discussed the teratogenicity of the ribavirin.     PLAN:  1. CBC and liver enzymes ordered. 2. Week 8 HCV RNA ordered. 3. She will take these labs to [facility] next Friday or Saturday at week 8 to ensure that she has had the maximum amount of interferon and Ribavirin and boceprevir before her next HCV RNA. 4. She is hepatitis B immune. 5. She is hepatitis A naive and she be vaccinated through her primary physician. 6. She will be seen again in approximately 4 weeks' time.            Marty Heck, MD    [phone]  D:  Thu Feb 07 20:38:39 2013 ; T:  Sat Feb 09 11:01:55 2013  Job #:  [id number]

## 2011-09-27 ENCOUNTER — Ambulatory Visit (INDEPENDENT_AMBULATORY_CARE_PROVIDER_SITE_OTHER): Payer: Self-pay | Admitting: Gastroenterology

## 2011-09-27 DIAGNOSIS — B182 Chronic viral hepatitis C: Secondary | ICD-10-CM

## 2011-09-27 LAB — CBC WITH DIFFERENTIAL/PLATELET
Basophils Absolute: 0 10*3/uL (ref 0.0–0.1)
Basophils Relative: 1 % (ref 0–1)
MCHC: 28.3 g/dL — ABNORMAL LOW (ref 30.0–36.0)
Neutro Abs: 1.5 10*3/uL — ABNORMAL LOW (ref 1.7–7.7)
Neutrophils Relative %: 52 % (ref 43–77)
Platelets: 277 10*3/uL (ref 150–400)
RDW: 15.2 % (ref 11.5–15.5)

## 2011-09-27 LAB — TSH: TSH: 2.196 u[IU]/mL (ref 0.350–4.500)

## 2011-09-27 LAB — HEPATIC FUNCTION PANEL
Albumin: 3.4 g/dL — ABNORMAL LOW (ref 3.5–5.2)
Total Protein: 6.6 g/dL (ref 6.0–8.3)

## 2011-09-27 NOTE — Progress Notes (Signed)
NAME: ALLEYA, SLOWIK  MR#: [id number]      DATE: 09/27/2011  DOB: Dec 26, 1960    cc:  Wenda Low, MD, [facility] at [place], [address] [place], [place], [place], [address], fax number [phone].   [facility], [address], [address], [place], [place], [address], fax number [phone].   Nonda Lou FNP, [facility], [address], [place], [place], [place] [address], fax number [phone].    REASON FOR FOLLOWUP:  Follow up of genotype 1A hepatitis C, IL 28 B TT, week 10 of treatment with Pegasys, ribavirin and boceprevir.   HISTORY:  The patient returns today accompanied by her significant other. Her start date for Pegasys ribavirin was 07/13/2011. She started on boceprevir around 08/17/2011.  She now admits that she may miss half the night doses of boceprevir because she sleeps through the dosing time at least 3 of 7 days each week, compromising her treatment.  She also did not get labs done at week 8 as ordered because her [place] had lapsed and is now renewed.  The labs were done yesterday so I do not have a week 8 viral load.  She complains of pruritis and a rash.  Claritin 10 mg prn is not that helpful for the itching and the topical over-the-counter cortisone is not that helpful for the pruritis.  PAST MEDICAL HISTORY:  Significant for type 2 diabetes.   CURRENT MEDICATIONS:  Lantus insulin 31 units subcu in the a.m., Diovan/hydrochlorothiazide 320/25 mg daily, amlodipine 10 mg daily, cyclobenzaprine 5 mg at bedtime, iron 325 mg t.i.d., ibuprofen 200 mg every 6 hours p.r.n.,  Nasonex 2 inhalations each nostril daily, Pegasys 180 mcg subcu weekly, ribavirin 600 mg b.i.d., boceprevir 800 mg every 8h.   ALLERGIES:  Iodine and shellfish with the shellfish causing hives.   HABITS:  Smoking: Quit over 3 years ago. Alcohol: Denies interval consumption.   REVIEW OF SYSTEMS:  All 10 systems reviewed  today with [name] and they are negative other than which was mentioned above. Her CES-D was 31.   PHYSICAL EXAMINATION:  Constitutional: Well appearing. Vital signs: Height is 64, inches, weight 174. Blood pressure 151/95, pulse 95, temperature 98.0 Fahrenheit.    ASSESSMENT:  Patient is a 51 year old woman with history of genotype 1A hepatitis C, IL 28 B CT with a biopsy in 08/2003 showing grade 2, stage I disease. Baseline HCV RNA was 736,000 international units per mL.  Her start date for Pegasys and ribavirin was 07/13/2011, putting her at week 10.  Her week 4 HCV RNA had fallen on just Pegasys and ribavirin to 736,000 international units per mL with 2.5 log decline.  Her week 8 viral load was not done, but was done yesterday so this is a week 10 viral load.  Furthermore, she is underdosing her boceprevir, which puts her at risk to resistance to protease inhibitors and failure to respond to treatment.  Because she underdosed her boceprevir and there is no week 8 HCV RNA, I think she should not be offered Response Guided Therapy.  Today I emphasized the risk of nonresponse and resistance and warned her that I may need to stop therapy if her viral load at week 12 was over 100 IU/mL.  We discussed taking more Claritin and Benadryl 25 - 50 mg at HS for night time pruritis.  PLAN:  1. Await yesterday's HCV RNA to determine if she can benefit from response guided therapy. 2. No  labs today as just done yesterday. 3. She is hepatitis B immune. 4. She is hepatitis A naive and she will need to be vaccinated through her primary physician. 5. I told her to start dosing her boceprevir at 6 AM so she would not miss her late PM dose. 6. Reduce her ribavirin to 400 mg BID for anemia noted on yesterday's labs 7. She will be seen again in approximately 2 weeks' time for a week 12 HCV RNA.            Marty Heck, MD    ADDENDUM HCV RNA Not Detected (week 10)

## 2011-09-27 NOTE — Patient Instructions (Signed)
First, if the itching is really bad, then take 2 Claritins at a time Second, at night, take a Benadryl 25 to 50 mg before bed for the itching.  This will sedate you. Third, reduce the ribavirin to 800 mg a day (2 pills twice a day), BUT YOU MUST TAKE EACH DOSE OF VICTRELIS before doing so.

## 2011-09-28 LAB — HEPATITIS C RNA QUANTITATIVE

## 2011-10-11 ENCOUNTER — Ambulatory Visit (INDEPENDENT_AMBULATORY_CARE_PROVIDER_SITE_OTHER): Payer: Self-pay | Admitting: Gastroenterology

## 2011-10-11 DIAGNOSIS — B182 Chronic viral hepatitis C: Secondary | ICD-10-CM

## 2011-10-11 MED ORDER — FEXOFENADINE HCL 180 MG PO TABS: 180.0000 mg | ORAL_TABLET | Freq: Every day | ORAL | Status: DC

## 2011-10-11 MED ORDER — TRIAMCINOLONE ACETONIDE 0.5 % EX OINT: TOPICAL_OINTMENT | Freq: Two times a day (BID) | CUTANEOUS | Status: DC

## 2011-10-11 MED ORDER — FEXOFENADINE HCL 180 MG PO TABS: 180.0000 mg | ORAL_TABLET | Freq: Every day | ORAL | Status: AC

## 2011-10-11 NOTE — Progress Notes (Signed)
NAME: Maureen Barnes, Maureen Barnes  MR#: [id number]      DATE: 10/11/2011  DOB: [birth year]    cc:  Maureen Low, MD, [facility] at [place], [address] [place], [place], [place], [address], fax number [phone].   [facility], [address], [address], [place], [place], [address], fax number [phone].   Nonda Lou FNP, [facility], [address] [place], [place], [place] [address], fax number [phone].    REASON FOR FOLLOWUP:  Follow up of genotype 1A hepatitis C, IL 28 B TT, week 12 of treatment with Pegasys, ribavirin and boceprevir.   HISTORY:  The patient returns today accompanied by her significant other. Her start date for Pegasys ribavirin was 07/13/2011. She started on boceprevir around 08/17/2011. At her last visit she admited that she may have missed half the night doses of boceprevir because she slept through the dosing time at least 3 of 7 days each week, compromising her treatment.  Maureen Barnes now reports compliance with all her antivirals. She also did not get labs done at week 8 as ordered because her [place] had lapsed and is now renewed.  So there will not be a week 8 viral load but her week 10 viral load was undetectable.  She continues to complain about pruritis and rash.  She thought the Benadryl was too sedating, though effective.  OTC cortisone worked for some degree for the rash but not for the pruritis.  Claritin had a decongestant that she did not like.  PAST MEDICAL HISTORY:  Significant for type 2 diabetes.  She reports that her fasting blood sugars range between 80 and 100.  CURRENT MEDICATIONS:  Lantus insulin 31 units subcu in the a.m., Diovan/hydrochlorothiazide 320/25 mg daily, amlodipine 10 mg daily, cyclobenzaprine 5 mg at bedtime, iron 325 mg t.i.d., ibuprofen 200 mg every 6 hours p.r.n.,  Nasonex 2 inhalations each nostril daily, Pegasys 180 mcg subcu weekly, ribavirin 400 mg b.i.d.  (dose reduced on 09/27/11, at week 10, for anemia), boceprevir 800 mg every 8h.   ALLERGIES:  Iodine and shellfish with the shellfish causing hives.   HABITS:  Smoking: Quit over 3 years ago. Alcohol: Denies interval consumption.   REVIEW OF SYSTEMS:  All 10 systems reviewed today with Maureen. Wynetta Barnes and they are negative other than which was mentioned above. Her CES-D was 31.   PHYSICAL EXAMINATION:  Constitutional: Well appearing. Vital signs: Height is 64, inches, weight 180. Blood pressure 130/88, pulse 108, temperature 99.4 Fahrenheit.   ASSESSMENT:  Patient is a 51 year old woman with history of genotype 1A hepatitis C, IL 28 B CT with a biopsy in 08/2003 showing grade 2, stage I disease. Baseline HCV RNA was 736,000 international units per mL.  Her start date for Pegasys and ribavirin was 07/13/2011, putting her at week 12.  Her week 4 HCV RNA had fallen on just Pegasys and ribavirin to 736,000 international units per mL with 2.5 log decline. Her week 8 viral load was not done, but her week 10 viral load was undetectable. She underdosed her boceprevir, which puts her at risk to resistance to protease inhibitors and failure to respond to treatment.  I think I may reconsider my last statement about RGT, especially if she is not tolerating therapy, but I would like to keep the ribavirin at as high a dose as possible.   We discussed her treatment results and side effect management. She agreed to try Allegra and triamcinolone 0.5% topically.  PLAN:  1. She is hepatitis B immune. 2. She is hepatitis A naive and she will need to be vaccinated through her primary physician. 3. Continue ribavirin to 400 mg BID for anemia. 4. CBC today 5. Allegra 180 mg daily 30 days 4 refills and Triamcinolone 0.5% topically BID 30 gm 4 refills. 6. She will be seen again in approximately 4 weeks.  Marty Heck, MD  ADDENDUM Week 12 HCV No detectable level of HCV RNA.

## 2011-10-12 LAB — CBC WITH DIFFERENTIAL/PLATELET
Basophils Absolute: 0 10*3/uL (ref 0.0–0.1)
Basophils Relative: 1 % (ref 0–1)
Hemoglobin: 9.4 g/dL — ABNORMAL LOW (ref 12.0–15.0)
MCHC: 30.2 g/dL (ref 30.0–36.0)
Monocytes Relative: 9 % (ref 3–12)
Neutro Abs: 0.9 10*3/uL — ABNORMAL LOW (ref 1.7–7.7)
Neutrophils Relative %: 23 % — ABNORMAL LOW (ref 43–77)

## 2011-10-12 LAB — HEPATIC FUNCTION PANEL
ALT: 23 U/L (ref 0–35)
Bilirubin, Direct: <0.1 mg/dL (ref 0.0–0.3)
Total Bilirubin: 0.4 mg/dL (ref 0.3–1.2)

## 2011-10-16 LAB — HEPATITIS C RNA QUANTITATIVE: HCV Quantitative: NOT DETECTED IU/mL (ref <43)

## 2011-11-01 ENCOUNTER — Ambulatory Visit: Payer: Self-pay | Admitting: Gastroenterology

## 2011-11-08 ENCOUNTER — Ambulatory Visit (INDEPENDENT_AMBULATORY_CARE_PROVIDER_SITE_OTHER): Payer: Self-pay | Admitting: Gastroenterology

## 2011-11-08 DIAGNOSIS — B182 Chronic viral hepatitis C: Secondary | ICD-10-CM

## 2011-11-08 LAB — HEPATIC FUNCTION PANEL
ALT: 26 U/L (ref 0–35)
Albumin: 3.5 g/dL (ref 3.5–5.2)
Alkaline Phosphatase: 66 U/L (ref 39–117)
Total Protein: 6.8 g/dL (ref 6.0–8.3)

## 2011-11-09 LAB — CBC WITH DIFFERENTIAL/PLATELET
Eosinophils Absolute: 0.1 10*3/uL (ref 0.0–0.7)
Eosinophils Relative: 3 % (ref 0–5)
HCT: 33.2 % — ABNORMAL LOW (ref 36.0–46.0)
Lymphocytes Relative: 35 % (ref 12–46)
Lymphs Abs: 0.8 10*3/uL (ref 0.7–4.0)
MCH: 32.3 pg (ref 26.0–34.0)
MCV: 107.1 fL — ABNORMAL HIGH (ref 78.0–100.0)
Monocytes Absolute: 0.4 10*3/uL (ref 0.1–1.0)
RDW: 14.2 % (ref 11.5–15.5)
WBC: 2.4 10*3/uL — ABNORMAL LOW (ref 4.0–10.5)

## 2011-11-15 NOTE — Progress Notes (Signed)
NAMEMarland Barnes  PHILIPPINE, NEWVINE  MR#:  [id number]      DATE:  11/08/2011  DOB:  27-Jan-1961    cc: Consulting Physician: [facility], [address], [address], [place], [place] [address], Fax [phone]  Primary Care Physician: Wenda Low, MD, [facility] at [place], [address], [place], [place], [place] [address], [place] [phone]  Referring Physician: Vicenta Aly, [place], [facility], [address] [place], [place],  [address], Fax [phone]    REASON FOR VISIT:  Follow up of genotype 1a hepatitis C, IL28B TT, week 16 of Pegasys, ribavirin and boceprevir.   HISTORY:  The patient returns accompanied by her significant other. Her start date for Pegasys and ribavirin was 07/13/2011. She started on boceprevir around 08/17/2011. There has been some compromises in the afternoon dosing of boceprevir at times, which she admitted to at the last clinic appointment because she would sleep at least 3 of the 7 days of the week.  Before last appointment, she had slept through some of the doses of her boceprevir earlier on. Today, she reports that 5 out of 7 days a week she has difficulty with fatigue and it is particularly worse the day after each injection of interferon. The rash she experienced in the past is better on triamcinolone.  She thinks the Allegra that she is taking also for the pruritus is causing daily headaches. There are no new symptoms referable to her history of hepatitis C or treatment.   PAST MEDICAL HISTORY:  Significant for type 2 diabetes.   CURRENT MEDICATIONS:  1. Lantus insulin 31 units subcu in the a.m.  2. Diovan/hydrochlorothiazide 320/25 mg daily.  3. Amlodipine 10 mg daily.  4. Cyclobenzaprine 5 mg at bedtime.  5. Iron 325 t.i.d.  6. Ibuprofen 200 mg every 6 hours p.r.n.  7. Nasonex 2 inhalations to each nostril daily.  8. Pegasys 180 mcg subcu weekly. 9. Ribavirin 400 mg b.i.d. 10. Boceprevir  dose reduced on 09/27/2011 at week 10 for anemia, 800 mg q.8 hours.  11. Triamcinolone 0.5% topically b.i.d. 12. Allegra 180 mg daily p.r.n.   ALLERGIES:  Iodine and shellfish with shellfish causing hives.   HABITS:  Smoking, quit over 3 years ago. Alcohol denies interval consumption.   REVIEW OF SYSTEMS:  All 10 systems reviewed today with the patient and they are negative other than which is mentioned above. CES-D was 26.   PHYSICAL EXAMINATION:  Constitutional: Well appearing. Vital Signs: Height 64 inches, weight 176 pounds, blood pressure 139/94, pulse 101, temperature 98.7 Fahrenheit.   ASSESSMENT:  The patient is a 51 year old woman with history of genotype 1a hepatitis C, IL28B CT, with biopsy in February 2005 showing grade 2 stage I disease. Her baseline HCV RNA was 736,000 international units per mL. Her start date for Pegasys and ribavirin was 07/13/2011, putting her at week 16 with her seventeenth injection of Pegasys tomorrow. Her week 4 HCV RNA on Pegasys and ribavirin alone had fallen to 736,000 international units per mL with a 2.45 log decline. Her week 8 viral load was not done on time, but at week 10 was undetectable though under dosing of boceprevir, week 12 HCV RNA was undetectable.   I think given the response, despite dose reduction of boceprevir and the timing of the week 8 HCV RNA, I think it would be reasonable to stop therapy at week 28 provided week 24 HCV RNA is undetectable in keeping with response guided rules.  This is particularly true because she is having a difficult time with therapy. In terms of her side effects, I could reduce the interferon to 135 from 180 mcg weekly, but I do not want to compromise her response. Furthermore, I could reduce her ribavirin to lower than 400 mg b.i.d., but again I do not want to compromise response to therapy if we are going to use shorter duration.   In my discussion today with the patient and her significant other, I  discussed her week 12 HCV RNA and its implications. We discussed possibly doing response guided therapy to stop at week 28. I have warned her of the possibility of relapse, particularly because of her under dosing the boceprevir at times, as well as the timing of the HCV RNA at week 10 rather week 8.   However, I explained to her that the side effects she is experiencing related to her hepatitis C therapy may make it advantageous to stick to the response guided therapy rules. She was very much in favor of stopping treatment if she could.   plan:   1. Hepatitis B immune.  2. Will require hepatitis A vaccination through her primary.  3. Continue with the ribavirin 400 mg b.i.d. for anemia.  4. Continue with Allegra and triamcinolone.  5. CBC and liver enzymes today.  6. She will return in 4 weeks' time.               Marty Heck, MD   [phone]  D:  Thu Apr 18 18:26:55 2013 ; T:  Thu Apr 18 23:11:50 2013  Job #:  [id number]

## 2011-11-29 ENCOUNTER — Encounter (HOSPITAL_COMMUNITY): Payer: Self-pay | Admitting: Family Medicine

## 2011-11-29 ENCOUNTER — Emergency Department (HOSPITAL_COMMUNITY)
Admission: EM | Admit: 2011-11-29 | Discharge: 2011-11-29 | Disposition: A | Discharge status: Home / Self Care | Payer: Self-pay | Attending: Emergency Medicine | Admitting: Emergency Medicine

## 2011-11-29 DIAGNOSIS — E119 Type 2 diabetes mellitus without complications: Secondary | ICD-10-CM | POA: Insufficient documentation

## 2011-11-29 DIAGNOSIS — G501 Atypical facial pain: Secondary | ICD-10-CM | POA: Insufficient documentation

## 2011-11-29 DIAGNOSIS — Z8619 Personal history of other infectious and parasitic diseases: Secondary | ICD-10-CM | POA: Insufficient documentation

## 2011-11-29 DIAGNOSIS — I10 Essential (primary) hypertension: Secondary | ICD-10-CM | POA: Insufficient documentation

## 2011-11-29 DIAGNOSIS — R6884 Jaw pain: Secondary | ICD-10-CM | POA: Insufficient documentation

## 2011-11-29 DIAGNOSIS — R079 Chest pain, unspecified: Secondary | ICD-10-CM | POA: Insufficient documentation

## 2011-11-29 LAB — DIFFERENTIAL
Basophils Absolute: 0 10*3/uL (ref 0.0–0.1)
Eosinophils Absolute: 0 10*3/uL (ref 0.0–0.7)
Lymphocytes Relative: 42 % (ref 12–46)
Monocytes Relative: 17 % — ABNORMAL HIGH (ref 3–12)
Neutro Abs: 0.9 10*3/uL — ABNORMAL LOW (ref 1.7–7.7)
Neutrophils Relative %: 39 % — ABNORMAL LOW (ref 43–77)

## 2011-11-29 LAB — CBC
MCHC: 31.7 g/dL (ref 30.0–36.0)
Platelets: 184 10*3/uL (ref 150–400)
RDW: 13.4 % (ref 11.5–15.5)
WBC: 2.3 10*3/uL — ABNORMAL LOW (ref 4.0–10.5)

## 2011-11-29 LAB — PATHOLOGIST SMEAR REVIEW

## 2011-11-29 MED ORDER — OXYCODONE HCL 5 MG PO TABS
5.0000 mg | ORAL_TABLET | Freq: Once | ORAL | Status: AC
  Administered 2011-11-29: 5 mg via ORAL
  Filled 2011-11-29: qty 1

## 2011-11-29 MED ORDER — OXYCODONE HCL 5 MG PO TABS: 5.0000 mg | ORAL_TABLET | ORAL | Status: AC | PRN

## 2011-11-29 MED ORDER — ACETAMINOPHEN 325 MG PO TABS
650.0000 mg | ORAL_TABLET | Freq: Once | ORAL | Status: AC
  Administered 2011-11-29: 650 mg via ORAL
  Filled 2011-11-29: qty 2

## 2011-11-29 NOTE — ED Notes (Signed)
Patient states she woke up with right facial pain. States pain radiates to right jaw and right shoulder. Denies numbness or tingling.

## 2011-11-29 NOTE — Discharge Instructions (Signed)
Pain of Unknown Etiology (Pain Without a Known Cause) You have come to your caregiver because of pain. Pain can occur in any part of the body. Often there is not a definite cause. If your laboratory (blood or urine) work was normal and x-rays or other studies were normal, your caregiver may treat you without knowing the cause of the pain. An example of this is the headache. Most headaches are diagnosed by taking a history. This means your caregiver asks you questions about your headaches. Your caregiver determines a treatment based on your answers. Usually testing done for headaches is normal. Often testing is not done unless there is no response to medications. Regardless of where your pain is located today, you can be given medications to make you comfortable. If no physical cause of pain can be found, most cases of pain will gradually leave as suddenly as they came.  If you have a painful condition and no reason can be found for the pain, It is importantthat you follow up with your caregiver. If the pain becomes worse or does not go away, it may be necessary to repeat tests and look further for a possible cause.  Only take over-the-counter or prescription medicines for pain, discomfort, or fever as directed by your caregiver.   For the protection of your privacy, test results can not be given over the phone. Make sure you receive the results of your test. Ask as to how these results are to be obtained if you have not been informed. It is your responsibility to obtain your test results.   You may continue all activities unless the activities cause more pain. When the pain lessens, it is important to gradually resume normal activities. Resume activities by beginning slowly and gradually increasing the intensity and duration of the activities or exercise. During periods of severe pain, bed-rest may be helpful. Lay or sit in any position that is comfortable.   Ice used for acute (sudden) conditions may be  effective. Use a large plastic bag filled with ice and wrapped in a towel. This may provide pain relief.   See your caregiver for continued problems. They can help or refer you for exercises or physical therapy if necessary.  If you were given medications for your condition, do not drive, operate machinery or power tools, or sign legal documents for 24 hours. Do not drink alcohol, take sleeping pills, or take other medications that may interfere with treatment. See your caregiver immediately if you have pain that is becoming worse and not relieved by medications. Document Released: 04/03/2001 Document Revised: 06/28/2011 Document Reviewed: 07/09/2005 [facility] Patient Information 2012 [place]. No definitive cause of your pain has been identified today.  Her white count is stable for you at 2.3, indicating no infection, your EKG, is normal.  Please followup with your primary care physician.  You've also been referred to a dentist for further evaluation of your facial/jaw pain

## 2011-11-29 NOTE — ED Notes (Signed)
Significant other states that he has noticed that patient's speech is slurred and that is walking slower than usual.

## 2011-11-29 NOTE — ED Notes (Signed)
Manuela Neptune, NP at bedside.

## 2011-11-29 NOTE — ED Provider Notes (Signed)
History     CSN: [id number]  Arrival date & time 11/29/11  0011   First MD Initiated Contact with Patient 11/29/11 0138      Chief Complaint  Patient presents with  . Facial Pain    (Consider location/radiation/quality/duration/timing/severity/associated sxs/prior treatment) HPI Comments: On May 8.  Patient woke with slight left-sided chest pressure that is worse when she is sitting up.  Results when she lays down.  Is not reproducible by palpation.  It has lasted throughout the day.  Tonight she woke up from sleep with right cheek and jaw pain.  That does not radiate.  Denies toothache ear pain, rhinitis, sinus pressure  The history is provided by the patient.    Past Medical History  Diagnosis Date  . Diabetes mellitus   . Hypertension   . Anemia   . Hep C w/o coma, chronic     Past Surgical History  Procedure Date  . Tubal ligation     No family history on file.  History  Substance Use Topics  . Smoking status: Never Smoker   . Smokeless tobacco: Not on file  . Alcohol Use: No    OB History    Grav Para Term Preterm Abortions TAB SAB Ect Mult Living                  Review of Systems  Constitutional: Negative for fever and chills.  HENT: Negative for neck pain and neck stiffness.   Eyes: Negative for pain and visual disturbance.  Respiratory: Positive for chest tightness.   Cardiovascular: Negative for palpitations and leg swelling.  Gastrointestinal: Negative for nausea and vomiting.    Allergies  Iodine; Peanut-containing drug products; and Shellfish allergy  Home Medications   Current Outpatient Rx  Name Route Sig Dispense Refill  . AMLODIPINE BESYLATE 10 MG PO TABS Oral Take 10 mg by mouth daily.      Marland Kitchen BOCEPREVIR 200 MG PO CAPS Oral Take 800 mg by mouth every 8 (eight) hours. 360 capsule 7  . FEXOFENADINE HCL 180 MG PO TABS Oral Take 1 tablet (180 mg total) by mouth daily. 30 tablet 4  . LANTUS SOLOSTAR [place] Subcutaneous Inject 31 Units into the  skin daily.     . TRIAMCINOLONE ACETONIDE 0.5 % EX OINT Topical Apply topically 2 (two) times daily. 30 g 4  . VALSARTAN-HYDROCHLOROTHIAZIDE 320-25 MG PO TABS Oral Take 1 tablet by mouth daily.      . OXYCODONE HCL 5 MG PO TABS Oral Take 1 tablet (5 mg total) by mouth every 4 (four) hours as needed for pain. 15 tablet 0    BP 131/80  Pulse 100  Temp(Src) 98.2 F (36.8 C) (Oral)  Resp 17  Ht 5\' 3"  (1.6 m)  Wt 176 lb (79.833 kg)  BMI 31.18 kg/m2  SpO2 98%  LMP 07/10/2011  Physical Exam  Constitutional: She appears well-developed.  HENT:  Head: Normocephalic.  Right Ear: Tympanic membrane, external ear and ear canal normal. No swelling or tenderness. No mastoid tenderness.  Left Ear: Tympanic membrane, external ear and ear canal normal. No swelling or tenderness. No mastoid tenderness.       No evidence of and caries gum swelling.  Gingivitis  Eyes: Pupils are equal, round, and reactive to light.  Neck: Normal range of motion.  Cardiovascular: Normal rate.   Pulmonary/Chest: Effort normal.  Abdominal: Soft.  Musculoskeletal: Normal range of motion.  Neurological: She is alert.  Skin: Skin is warm.  ED Course  Procedures (including critical care time)  Labs Reviewed  CBC - Abnormal; Notable for the following:    WBC 2.3 (*)    RBC 2.60 (*)    Hemoglobin 8.5 (*) REPEATED TO VERIFY   HCT 26.8 (*)    MCV 103.1 (*)    All other components within normal limits  DIFFERENTIAL - Abnormal; Notable for the following:    Neutrophils Relative 39 (*)    Monocytes Relative 17 (*)    Neutro Abs 0.9 (*)    All other components within normal limits  PATHOLOGIST SMEAR REVIEW   No results found.   1. Facial pain, atypical     ED ECG REPORT   Date: 11/29/2011  EKG Time: 4:30 AM  Rate: 96  Rhythm: normal sinus rhythm,   there are no previous tracings available for comparison  Axis: normal  Intervals:low voltage in frontal leads, borderline R wave progression   ST&T  Change:none  Narrative Interpretation: borderline EKG   WHITE count is 2.3, but this is been typical for this patient for the past 4 months.  She is followed by ID for hepatitis C            MDM  Dental pain , will refer patient to dentist and follow up with her primary care physician        Garald Balding, NP 11/29/11 0430  Garald Balding, NP 11/29/11 0430

## 2011-11-30 LAB — POCT I-STAT, CHEM 8
Calcium, Ion: 1.17 mmol/L (ref 1.12–1.32)
Chloride: 105 mEq/L (ref 96–112)
Glucose, Bld: 79 mg/dL (ref 70–99)
HCT: 26 % — ABNORMAL LOW (ref 36.0–46.0)
TCO2: 26 mmol/L (ref 0–100)

## 2011-11-30 LAB — POCT I-STAT TROPONIN I: Troponin i, poc: 0.01 ng/mL (ref 0.00–0.08)

## 2011-11-30 NOTE — ED Provider Notes (Signed)
Medical screening examination/treatment/procedure(s) were performed by non-physician practitioner and as supervising physician I was immediately available for consultation/collaboration.  Carmin Muskrat, MD 11/30/11 [phone]

## 2011-12-06 ENCOUNTER — Ambulatory Visit: Payer: Self-pay | Admitting: Gastroenterology

## 2011-12-06 NOTE — Progress Notes (Signed)
Lab draw visit approx week 20.

## 2011-12-13 ENCOUNTER — Ambulatory Visit: Payer: Self-pay | Admitting: Gastroenterology

## 2011-12-13 ENCOUNTER — Ambulatory Visit (INDEPENDENT_AMBULATORY_CARE_PROVIDER_SITE_OTHER): Payer: Self-pay | Admitting: Gastroenterology

## 2011-12-13 DIAGNOSIS — B182 Chronic viral hepatitis C: Secondary | ICD-10-CM

## 2011-12-14 LAB — CBC WITH DIFFERENTIAL/PLATELET
Basophils Absolute: 0 10*3/uL (ref 0.0–0.1)
Eosinophils Absolute: 0.1 10*3/uL (ref 0.0–0.7)
Eosinophils Relative: 3 % (ref 0–5)
MCH: 32.9 pg (ref 26.0–34.0)
MCV: 100.3 fL — ABNORMAL HIGH (ref 78.0–100.0)
Neutrophils Relative %: 32 % — ABNORMAL LOW (ref 43–77)
Platelets: 188 10*3/uL (ref 150–400)
RDW: 13.6 % (ref 11.5–15.5)
WBC: 1.9 10*3/uL — ABNORMAL LOW (ref 4.0–10.5)

## 2011-12-14 LAB — HEPATIC FUNCTION PANEL
AST: 50 U/L — ABNORMAL HIGH (ref 0–37)
Albumin: 3.5 g/dL (ref 3.5–5.2)
Alkaline Phosphatase: 61 U/L (ref 39–117)
Total Bilirubin: 0.3 mg/dL (ref 0.3–1.2)
Total Protein: 7 g/dL (ref 6.0–8.3)

## 2011-12-14 LAB — PATHOLOGIST SMEAR REVIEW

## 2011-12-17 LAB — HEPATITIS C RNA QUANTITATIVE: HCV Quantitative: NOT DETECTED IU/mL (ref <43)

## 2011-12-20 ENCOUNTER — Encounter: Payer: Self-pay | Admitting: Gastroenterology

## 2011-12-20 NOTE — Progress Notes (Signed)
NAMEMarland Kitchen  Maureen Barnes, Maureen Barnes  MR#:  [id number]      DATE:  12/13/2011  DOB:  Jan 03, 1961    cc: Consulting Physician: [facility], [address], [address], [place], [place] [address], Fax [phone]   Primary Care Physician: Wenda Low, MD, [facility] at [place], [place], [place], [place], [place] [address], [place] [phone]   Referring Physician: Vicenta Aly, [place], [facility], [address] [place], [place], [place] [address], Fax [phone]     REASON FOR VISIT:  Follow up genotype 1a hepatitis C, IL28B TT, week 21 of Pegasys, ribavirin, and boceprevir.   HISTORY:  The patient returns today unaccompanied. Her start date for Pegasys and ribavirin was 07/13/2011, her start date for boceprevir was 08/17/2011. This puts her at approximately week 21 of therapy with her twenty-second injection of interferon due tomorrow. The patient  complains of some hayfever and sinus congestion. She also complains of hot and cold flashes and irregular menstrual cycles believing that she is undergoing menopause. Otherwise, there are no symptoms directly referable to her treatment for hepatitis C.   PAST MEDICAL HISTORY:  Significant for type 2 diabetes. She reported a recent episode of facial pain whereupon she was seen in the ED.  She may have been told then to reduce the ibuprofen because of a complaint of dyspepsia, though this was not documented in the ED note.  CURRENT MEDICATIONS:  1. Omeprazole 20 mg p.o. daily for 14 days given by her primary physician for complaints of epigastric discomfort.  2. Lantus insulin 31 units subcu a.m.  3. Diovan/hydrochlorothiazide 320/25 mg daily.  4. Amlodipine 10 mg daily.  5. Cyclobenzaprine 5 mg at bedtime.  6. Iron 325 t.i.d.  7. Ibuprofen 200 mg every 6 hours p.r.n., but she was advised to reduce this because of complaints of gastrointestinal upset recently.  8. Nasonex 2 inhalations to  each nostril daily.  9. Pegasys 180 mcg subcu weekly.  10. Ribavirin 400 mg b.i.d., dose reduced for anemia.   11. Boceprevir 800 mg every 8 hours.  12. Triamcinolone 0.5% topically b.i.d.   ALLERGIES: IODINE, SHELLFISH with SHELLFISH causing HIVES.   HABITS:  Smoking, quit over 3 years ago. Alcohol denies interval consumption.   REVIEW OF SYSTEMS:  All 10 systems reviewed today with the patient and they are negative other than which was mentioned above. CES-D was 37.   PHYSICAL EXAMINATION:  Constitutional: Well appearing. Vital signs: Height 64 inches, weight 175 pounds, blood pressure 142/89, pulse 100, temperature 96.8 Fahrenheit.   ASSESSMENT:  The patient is a 51 year old woman with history of genotype 1a hepatitis C, IL28B CT, with liver biopsy in February 2005, showing grade 2 stage I disease. Her baseline HCV RNA was 736,000 international units per mL. Her start date for Pegasys and ribavirin was 07/13/2011, putting her at week 21 of therapy with her twenty second injection of Pegasys tomorrow. A week 4 HCV RNA on Pegasys and ribavirin alone had fallen by a factor of 2.45 log. Her week 8 viral load was not done on time, but at week 10 was undetectable as was her week 12 HCV RNA. In 2 weeks' time, she will be due for week 24 HCV RNA. If she maintains negative at week 24 HCV RNA, then we can stop at week 28. She is having typical side effects and tolerating therapy reasonably well even with the need for dose reduction in the ribavirin because of anemia.  In my discussion today with the patient, we discussed her progress to date and symptom management.   PLAN:  1. Hepatitis B immune.  2. Requires hepatitis A vaccination through primary.  3. Ribavirin 400 mg b.i.d. for anemia continues.  4. CBC today.  5. Will draw her HCV RNA today at week 22, save her coming back in 2 weeks' time for another appointment.  6. Return in approximately 6 weeks' time, which would then be the end of  treatment, assuming week 22 HCV RNA is negative.              Marty Heck, MD   ADDENDUM Week 22 HCV RNA Not Detected.  HgB 9.9.  [phone]  D:  Thu May 23 19:42:28 2013 ; T:  Thu May 23 22:56:24 2013  Job #:  [id number]

## 2012-01-07 ENCOUNTER — Other Ambulatory Visit (HOSPITAL_COMMUNITY)
Admission: RE | Admit: 2012-01-07 | Discharge: 2012-01-07 | Disposition: A | Discharge status: Home / Self Care | Payer: Self-pay | Source: Ambulatory Visit | Attending: Internal Medicine | Admitting: Internal Medicine

## 2012-01-07 ENCOUNTER — Other Ambulatory Visit (HOSPITAL_COMMUNITY): Payer: Self-pay | Admitting: Internal Medicine

## 2012-01-07 ENCOUNTER — Other Ambulatory Visit: Payer: Self-pay | Admitting: Internal Medicine

## 2012-01-07 DIAGNOSIS — Z1151 Encounter for screening for human papillomavirus (HPV): Secondary | ICD-10-CM | POA: Insufficient documentation

## 2012-01-07 DIAGNOSIS — Z1231 Encounter for screening mammogram for malignant neoplasm of breast: Secondary | ICD-10-CM

## 2012-01-07 DIAGNOSIS — Z01419 Encounter for gynecological examination (general) (routine) without abnormal findings: Secondary | ICD-10-CM | POA: Insufficient documentation

## 2012-01-17 ENCOUNTER — Ambulatory Visit (INDEPENDENT_AMBULATORY_CARE_PROVIDER_SITE_OTHER): Payer: Self-pay | Admitting: Gastroenterology

## 2012-01-17 DIAGNOSIS — B182 Chronic viral hepatitis C: Secondary | ICD-10-CM

## 2012-01-30 ENCOUNTER — Ambulatory Visit (HOSPITAL_COMMUNITY): Payer: Self-pay

## 2012-02-07 NOTE — Progress Notes (Signed)
NAMEMarland Kitchen  Maureen Barnes, HENSEN  MR#:  [id number]      DATE:  01/17/2012  DOB:  Sep 07, 1960    cc: Consulting Physician:  [facility], [address], [place], [place] [address], [place] [phone] Primary Care Physician: Benita Stabile, MD, [facility] at [place], [place], [address], [place], [place] [address], [place] [phone]  Referring Physician: Vicenta Aly, [place], [facility], [address], [place], [place], [place] [address], Fax [phone]    REASON FOR VISIT:  Followup genotype 1a hepatitis C, IL28B TT, week 27 of Pegasys, ribavirin, and boceprevir.   HISTORY:  The patient returns today accompanied by her significant other. Her start date for Pegasys and ribavirin was 07/13/2011, and her start date for boceprevir was 08/17/2011. She is approximately week 27 of therapy with her twenty eighth injection tomorrow. She is feeling well without any complaints of skin rash. There are no psychiatric side effects that she complains about today. She mentions angular stomatitis and asked about what she can place on her lips for this problem.   PAST MEDICAL HISTORY:  Significant for type 2 diabetes. She reports good control of her blood sugars.   CURRENT MEDICATIONS:  1. Lantus insulin 31 units subcutaneously q.a.m.  2. Amlodipine 10 mg daily.  3. Cyclobenzaprine 5 mg at bedtime.  4. Iron 325 t.i.d.  5. Nasonex 2 inhalations to each nostril daily.  6. Pegasys 180 mcg weekly.  7. Ribavirin 400 mg b.i.d. dose reduced for anemia. 8. Boceprevir 800 mg every 8 hours.  9. Triamcinolone 0.5% topically b.i.d. p.r.n.  10. Benicar HCT (olmesartan) dose unknown.   ALLERGIES: 1. IODINE.  2. Shellfish causing hives.   habits:  Smoking, quit over 3 years ago. Alcohol denies interval consumption.    REVIEW OF SYSTEMS:  All 10 systems reviewed today with the patient and they are negative other than which is mentioned above. Her  CES-D was 16.   PHYSICAL EXAMINATION:  Constitutional: Well appearing. Vital Signs: Height 64 inches, weight 172 pounds, blood pressure 107/76, pulse 91, temperature 99.6 Fahrenheit.   assessment:  The patient is a 51 year old woman with history of genotype 1a hepatitis C, IL28B CT, liver biopsy on 09/18/2010, showing grade 2 stage I disease. Her baseline HCV RNA was 736 international units per mL. Her start date for Pegasys, ribavirin was 07/13/2011, putting her at week 27 with her twenty eighth injection of Pegasys tomorrow. Her week 4 HCV RNA on Pegasys and ribavirin had fallen by a factor of 2.45 log. Her week 8 viral load was not done on time, but at week 10 was undetectable. Her week 12, HCV RNA was also undetectable. Her week 22 HCV RNA, as a proxy for week 24, was undetectable. She would qualify for truncated therapy to end at 28 weeks considering her negative RNA and her lack of fibrosis on her biopsy. She is very pleased.   Today we discussed the early discontinuation based on response guided therapy. She is very pleased to do so. We discussed followup. To be more specific, we agreed that she would return in 6 months' time.   PLAN:  1. The patient will discontinue her ribavirin and boceprevir. She will take her twenty eighth shot tomorrow.  2. She will continue with her Pegasys and ribavirin for another 7 days and then discontinue.  3. She will return in 6 months' time for her week 24 off therapy viral load, which will correspond to assessment of  sustained virological response.  4. Hepatitis B immune.  5. Hepatitis A vaccination acquired through her primary.               Marty Heck, MD   [phone]  D:  Thu Jun 27 17:44:38 2013 ; TClaybon Jabs 2013  Job #:  [id number]

## 2012-02-14 ENCOUNTER — Ambulatory Visit (HOSPITAL_COMMUNITY)
Admission: RE | Admit: 2012-02-14 | Discharge: 2012-02-14 | Disposition: A | Discharge status: Home / Self Care | Payer: Self-pay | Source: Ambulatory Visit | Attending: Internal Medicine | Admitting: Internal Medicine

## 2012-02-14 DIAGNOSIS — Z1231 Encounter for screening mammogram for malignant neoplasm of breast: Secondary | ICD-10-CM | POA: Insufficient documentation

## 2012-05-15 ENCOUNTER — Encounter: Payer: Self-pay | Admitting: Family Medicine

## 2012-05-15 ENCOUNTER — Ambulatory Visit (INDEPENDENT_AMBULATORY_CARE_PROVIDER_SITE_OTHER): Payer: Self-pay | Admitting: Family Medicine

## 2012-05-15 VITALS — BP 125/87 | HR 85 | Temp 98.7°F | Ht 62.0 in | Wt 173.6 lb

## 2012-05-15 DIAGNOSIS — I10 Essential (primary) hypertension: Secondary | ICD-10-CM | POA: Insufficient documentation

## 2012-05-15 DIAGNOSIS — D649 Anemia, unspecified: Secondary | ICD-10-CM

## 2012-05-15 DIAGNOSIS — N951 Menopausal and female climacteric states: Secondary | ICD-10-CM

## 2012-05-15 DIAGNOSIS — B182 Chronic viral hepatitis C: Secondary | ICD-10-CM

## 2012-05-15 DIAGNOSIS — E119 Type 2 diabetes mellitus without complications: Secondary | ICD-10-CM

## 2012-05-15 DIAGNOSIS — Z794 Long term (current) use of insulin: Secondary | ICD-10-CM | POA: Insufficient documentation

## 2012-05-15 MED ORDER — GABAPENTIN 300 MG PO CAPS: 300.0000 mg | ORAL_CAPSULE | Freq: Every day | ORAL | Status: DC

## 2012-05-15 NOTE — Patient Instructions (Signed)
Take Gabapentin 300 mg (1 capsule) by mouth at bedtime. If still having symptoms after 4 days, increase dose to 2 capsules (600 mg) at bedtime. Follow up in 1 month.  Menopause Menopause is the normal time of life when menstrual periods stop completely. Menopause is complete when you have missed 12 consecutive menstrual periods. It usually occurs between the ages of 49 to 71, with an average age of 37. Very rarely does a woman develop menopause before 50 years old. At menopause, your ovaries stop producing the female hormones, estrogen and progesterone. This can cause undesirable symptoms and also affect your health. Sometimes the symptoms may occur 4 to 5 years before the menopause begins. There is no relationship between menopause and:  Oral contraceptives.  Number of children you had.  Race.  The age your menstrual periods started (menarche). Heavy smokers and very thin women may develop menopause earlier in life. CAUSES  The ovaries stop producing the female hormones estrogen and progesterone.  Other causes include:  Surgery to remove both ovaries.  The ovaries stop functioning for no known reason.  Tumors of the pituitary gland in the brain.  Medical disease that affects the ovaries and hormone production.  Radiation treatment to the abdomen or pelvis.  Chemotherapy that affects the ovaries. SYMPTOMS   Hot flashes.  Night sweats.  Decrease in sex drive.  Vaginal dryness and thinning of the vagina causing painful intercourse.  Dryness of the skin and developing wrinkles.  Headaches.  Tiredness.  Irritability.  Memory problems.  Weight gain.  Bladder infections.  Hair growth of the face and chest.  Infertility. More serious symptoms include:  Loss of bone (osteoporosis) causing breaks (fractures).  Depression.  Hardening and narrowing of the arteries (atherosclerosis) causing heart attacks and strokes. DIAGNOSIS   When the menstrual periods have  stopped for 12 straight months.  Physical exam.  Hormone studies of the blood. TREATMENT  There are many treatment choices and nearly as many questions about them. The decisions to treat or not to treat menopausal changes is an individual choice made with your caregiver. Your caregiver can discuss the treatments with you. Together, you can decide which treatment will work best for you. Your treatment choices may include:   Hormone therapy (estorgen and progesterone).  Non-hormonal medications.  Treating the individual symptoms with medication (for example antidepressants for depression).  Herbal medications that may help specific symptoms.  Counseling by a psychiatrist or psychologist.  Group therapy.  Lifestyle changes including:  Eating healthy.  Regular exercise.  Limiting caffeine and alcohol.  Stress management and meditation.  No treatment. HOME CARE INSTRUCTIONS   Take the medication your caregiver gives you as directed.  Get plenty of sleep and rest.  Exercise regularly.  Eat a diet that contains calcium (good for the bones) and soy products (acts like estrogen hormone).  Avoid alcoholic beverages.  Do not smoke.  If you have hot flashes, dress in layers.  Take supplements, calcium and vitamin D to strengthen bones.  You can use over-the-counter lubricants or moisturizers for vaginal dryness.  Group therapy is sometimes very helpful.  Acupuncture may be helpful in some cases. SEEK MEDICAL CARE IF:   You are not sure you are in menopause.  You are having menopausal symptoms and need advice and treatment.  You are still having menstrual periods after age 69.  You have pain with intercourse.  Menopause is complete (no menstrual period for 12 months) and you develop vaginal bleeding.  You need  a referral to a specialist (gynecologist, psychiatrist or psychologist) for treatment. SEEK IMMEDIATE MEDICAL CARE IF:   You have severe  depression.  You have excessive vaginal bleeding.  You fell and think you have a broken bone.  You have pain when you urinate.  You develop leg or chest pain.  You have a fast pounding heart beat (palpitations).  You have severe headaches.  You develop vision problems.  You feel a lump in your breast.  You have abdominal pain or severe indigestion. Document Released: 09/29/2003 Document Revised: 10/01/2011 Document Reviewed: 05/06/2008 [facility] Patient Information 2013 [place].   Perimenopause Perimenopause is the time when your body begins to move into the menopause (no menstrual period for 12 straight months). It is a natural process. Perimenopause can begin 2 to 8 years before the menopause and usually lasts for one year after the menopause. During this time, your ovaries may or may not produce an egg. The ovaries vary in their production of estrogen and progesterone hormones each month. This can cause irregular menstrual periods, difficulty in getting pregnant, vaginal bleeding between periods and uncomfortable symptoms. CAUSES  Irregular production of the ovarian hormones, estrogen and progesterone, and not ovulating every month.  Other causes include:  Tumor of the pituitary gland in the brain.  Medical disease that affects the ovaries.  Radiation treatment.  Chemotherapy.  Unknown causes.  Heavy smoking and excessive alcohol intake can bring on perimenopause sooner. SYMPTOMS   Hot flashes.  Night sweats.  Irregular menstrual periods.  Decrease sex drive.  Vaginal dryness.  Headaches.  Mood swings.  Depression.  Memory problems.  Irritability.  Tiredness.  Weight gain.  Trouble getting pregnant.  The beginning of losing bone cells (osteoporosis).  The beginning of hardening of the arteries (atherosclerosis). DIAGNOSIS  Your caregiver will make a diagnosis by analyzing your age, menstrual history and your symptoms. They will do a  physical exam noting any changes in your body, especially your female organs. Female hormone tests may or may not be helpful depending on the amount and when you produce the female hormones. However, other hormone tests may be helpful (ex. thyroid hormone) to rule out other problems. TREATMENT  The decision to treat during the perimenopause should be made by you and your caregiver depending on how the symptoms are affecting you and your life style. There are various treatments available such as:  Treating individual symptoms with a specific medication for that symptom (ex. tranquilizer for depression).  Herbal medications that can help specific symptoms.  Counseling.  Group therapy.  No treatment. HOME CARE INSTRUCTIONS   Before seeing your caregiver, make a list of your menstrual periods (when the occur, how heavy they are, how long between periods and how long they last), your symptoms and when they started.  Take the medication as recommended by your caregiver.  Sleep and rest.  Exercise.  Eat a diet that contains calcium (good for your bones) and soy (acts like estrogen hormone).  Do not smoke.  Avoid alcoholic beverages.  Taking vitamin E may help in certain cases.  Take calcium and vitamin D supplements to help prevent bone loss.  Group therapy is sometimes helpful.  Acupuncture may help in some cases. SEEK MEDICAL CARE IF:   You have any of the above and want to know if it is perimenopause.  You want advice and treatment for any of your symptoms mentioned above.  You need a referral to a specialist (gynecologist, psychiatrist or psychologist). SEEK IMMEDIATE  MEDICAL CARE IF:   You have vaginal bleeding.  Your period lasts longer than 8 days.  You periods are recurring sooner than 21 days.  You have bleeding after intercourse.  You have severe depression.  You have pain when you urinate.  You have severe headaches.  You develop vision  problems. Document Released: 08/16/2004 Document Revised: 10/01/2011 Document Reviewed: 05/06/2008 [facility] Patient Information 2013 [place].

## 2012-05-15 NOTE — Progress Notes (Signed)
Here today referred from [place], c/o no bleeding at all since January, states feels like sometimes period is going to come but doesn't- has cramps, etc. C/o night sweats, hot flashes since January- states can't sleep good because the symptoms wake her up. States before that last period was having monthly periods that lasted 7 -10 days with heavy bleeding.

## 2012-05-15 NOTE — Progress Notes (Signed)
Subjective:    Patient ID: Maureen Barnes, female    DOB: 1961/05/01, 51 y.o.   MRN: [id number]  HPI Pt is a 51 y.o. [id number] who has had amenorrhea for 10 months (LMP December 2012). In January, she started having hot flashes. She wakes up several times a night sweating and is not sleeping well. She has hot flashes during the day too. The lack of sleep and discomfort are interfering with concentration, work function, and general well-being. Last pap smear June of this year, normal. Mammogram in July, normal. Denies mood swings or vaginal dryness.  PMH Past Medical History  Diagnosis Date  . Diabetes mellitus   . Hypertension   . Anemia   . Hep C w/o coma, chronic   Has just finished treatment for HepC with undetectable viral load.  No personal history of cancer.  No history or family history of blood clots.  Past Surgical History  Procedure Date  . Tubal ligation    History   Social History  . Marital Status: Single    Spouse Name: N/A    Number of Children: N/A  . Years of Education: N/A   Occupational History  . Not on file.   Social History Main Topics  . Smoking status: Never Smoker   . Smokeless tobacco: Never Used  . Alcohol Use: No  . Drug Use: No  . Sexually Active: Yes    Birth Control/ Protection: Surgical   Other Topics Concern  . Not on file   Social History Narrative  . No narrative on file    Review of Systems  Constitutional: Negative for fever, chills and unexpected weight change.  Eyes: Negative for visual disturbance.  Respiratory: Negative for cough, chest tightness and shortness of breath.   Cardiovascular: Negative for chest pain and palpitations.  Gastrointestinal: Negative for blood in stool.  Genitourinary: Negative for dysuria, urgency, vaginal bleeding, vaginal discharge, difficulty urinating, vaginal pain, pelvic pain and dyspareunia.  Neurological: Negative for dizziness and headaches.  Psychiatric/Behavioral: Positive for  disturbed wake/sleep cycle and decreased concentration. Negative for behavioral problems, confusion and dysphoric mood.       Objective:   Physical Exam  Constitutional: She is oriented to person, place, and time. She appears well-developed and well-nourished. No distress.  HENT:  Head: Normocephalic and atraumatic.  Eyes: Conjunctivae normal and EOM are normal.  Neck: Normal range of motion. Neck supple. No tracheal deviation present. No thyromegaly present.  Cardiovascular: Normal rate, regular rhythm and normal heart sounds.   No murmur heard. Pulmonary/Chest: Effort normal and breath sounds normal. No respiratory distress. She has no wheezes.  Abdominal: Soft. Bowel sounds are normal. She exhibits no distension. There is no tenderness. There is no rebound and no guarding.  Genitourinary: No vaginal discharge found.       Normal external genitalia, normal vagina, no CMT, no adnexal tenderness or masses, uterus non-tender, normal size, not fixed or deviated.  Musculoskeletal: She exhibits no edema and no tenderness.  Neurological: She is alert and oriented to person, place, and time.  Skin: Skin is warm and dry.  Psychiatric: She has a normal mood and affect.    Filed Vitals:   05/15/12 1258  BP: 125/87  Pulse: 85  Temp: 98.7 F (37.1 C)      Assessment & Plan:  51 y.o. perimenopausal female with hot flashes. - given cardiovascular risk factors, will avoid estrogen for now. - Trial of gabapentin, discussed need to titrate dose and frequency. No dose  adjustment needed for hepatic impairment, not hepatically metabolized. - If not successful after good trial, consider effexor. - Follow up in 1 month.  Martha Clan, MD

## 2012-06-16 ENCOUNTER — Ambulatory Visit (INDEPENDENT_AMBULATORY_CARE_PROVIDER_SITE_OTHER): Payer: Self-pay | Admitting: Obstetrics and Gynecology

## 2012-06-16 VITALS — BP 139/89 | HR 80 | Temp 97.8°F | Ht 63.0 in | Wt 177.0 lb

## 2012-06-16 DIAGNOSIS — N951 Menopausal and female climacteric states: Secondary | ICD-10-CM

## 2012-06-16 NOTE — Patient Instructions (Signed)
Perimenopause Perimenopause is the time when your body begins to move into the menopause (no menstrual period for 12 straight months). It is a natural process. Perimenopause can begin 2 to 8 years before the menopause and usually lasts for one year after the menopause. During this time, your ovaries may or may not produce an egg. The ovaries vary in their production of estrogen and progesterone hormones each month. This can cause irregular menstrual periods, difficulty in getting pregnant, vaginal bleeding between periods and uncomfortable symptoms. CAUSES  Irregular production of the ovarian hormones, estrogen and progesterone, and not ovulating every month.  Other causes include:  Tumor of the pituitary gland in the brain.  Medical disease that affects the ovaries.  Radiation treatment.  Chemotherapy.  Unknown causes.  Heavy smoking and excessive alcohol intake can bring on perimenopause sooner. SYMPTOMS   Hot flashes.  Night sweats.  Irregular menstrual periods.  Decrease sex drive.  Vaginal dryness.  Headaches.  Mood swings.  Depression.  Memory problems.  Irritability.  Tiredness.  Weight gain.  Trouble getting pregnant.  The beginning of losing bone cells (osteoporosis).  The beginning of hardening of the arteries (atherosclerosis). DIAGNOSIS  Your caregiver will make a diagnosis by analyzing your age, menstrual history and your symptoms. They will do a physical exam noting any changes in your body, especially your female organs. Female hormone tests may or may not be helpful depending on the amount and when you produce the female hormones. However, other hormone tests may be helpful (ex. thyroid hormone) to rule out other problems. TREATMENT  The decision to treat during the perimenopause should be made by you and your caregiver depending on how the symptoms are affecting you and your life style. There are various treatments available such as:  Treating  individual symptoms with a specific medication for that symptom (ex. tranquilizer for depression).  Herbal medications that can help specific symptoms.  Counseling.  Group therapy.  No treatment. HOME CARE INSTRUCTIONS   Before seeing your caregiver, make a list of your menstrual periods (when the occur, how heavy they are, how long between periods and how long they last), your symptoms and when they started.  Take the medication as recommended by your caregiver.  Sleep and rest.  Exercise.  Eat a diet that contains calcium (good for your bones) and soy (acts like estrogen hormone).  Do not smoke.  Avoid alcoholic beverages.  Taking vitamin E may help in certain cases.  Take calcium and vitamin D supplements to help prevent bone loss.  Group therapy is sometimes helpful.  Acupuncture may help in some cases. SEEK MEDICAL CARE IF:   You have any of the above and want to know if it is perimenopause.  You want advice and treatment for any of your symptoms mentioned above.  You need a referral to a specialist (gynecologist, psychiatrist or psychologist). SEEK IMMEDIATE MEDICAL CARE IF:   You have vaginal bleeding.  Your period lasts longer than 8 days.  You periods are recurring sooner than 21 days.  You have bleeding after intercourse.  You have severe depression.  You have pain when you urinate.  You have severe headaches.  You develop vision problems. Document Released: 08/16/2004 Document Revised: 10/01/2011 Document Reviewed: 05/06/2008 [facility] Patient Information 2013 [place].

## 2012-06-16 NOTE — Progress Notes (Signed)
CC: Follow-up    None     HPI Maureen Barnes is a 51 y.o. [phone]  Who is here for followup visit after being started on Neurontin 1 month ago for vasomotor symptoms. She did not get the desired effect with 300 mg at at bedtime analysis taking 600 mg at bedtime. This is helping her with her and insomnia and wakefulness however she still is having hot flashes. She is concerned about taking that medication during the day because she has to work and take classes. She has been amenorrheic for almost 11 months.  Past Medical History  Diagnosis Date  . Diabetes mellitus   . Hypertension   . Anemia   . Hep C w/o coma, chronic     OB History    Grav Para Term Preterm Abortions TAB SAB Ect Mult Living   3 3 3       3      # Outc Date GA Lbr Len/2nd Wgt Sex Del Anes PTL Lv   1 TRM 1987     CS      2 TRM 1990     SVD      3 TRM 1993     SVD         Past Surgical History  Procedure Date  . Tubal ligation     History   Social History  . Marital Status: Single    Spouse Name: N/A    Number of Children: N/A  . Years of Education: N/A   Occupational History  . Not on file.   Social History Main Topics  . Smoking status: Never Smoker   . Smokeless tobacco: Never Used  . Alcohol Use: No  . Drug Use: No  . Sexually Active: Yes    Birth Control/ Protection: Surgical   Other Topics Concern  . Not on file   Social History Narrative  . No narrative on file    Current Outpatient Prescriptions on File Prior to Visit  Medication Sig Dispense Refill  . amLODipine (NORVASC) 10 MG tablet Take 10 mg by mouth daily.        . Boceprevir 200 MG CAPS Take 800 mg by mouth every 8 (eight) hours.  360 capsule  7  . gabapentin (NEURONTIN) 300 MG capsule Take 1 capsule (300 mg total) by mouth at bedtime. Take 1 capsule by mouth at bedtime (300 mg) for four days. If still having symptoms, increase to 2 capsules (600 mg) by mouth at bedtime.  60 capsule  2  . Insulin Glargine (LANTUS  SOLOSTAR [place]) Inject 31 Units into the skin daily.       Marland Kitchen triamcinolone ointment (KENALOG) 0.5 % Apply topically 2 (two) times daily.  30 g  4  . valsartan-hydrochlorothiazide (DIOVAN-HCT) 320-25 MG per tablet Take 1 tablet by mouth daily.        . fexofenadine (ALLEGRA) 180 MG tablet Take 1 tablet (180 mg total) by mouth daily.  30 tablet  4  . [DISCONTINUED] loratadine (CLARITIN) 10 MG tablet Take 10 mg by mouth daily.          Allergies  Allergen Reactions  . Iodine Itching and Swelling    Shellfish-lips swell itching.  . Peanut-Containing Drug Products Itching and Swelling  . Shellfish Allergy Itching and Swelling    ROS Pertinent items in HPI. Of note she denies vaginal dryness or mood swings.  PHYSICAL EXAM Filed Vitals:   06/16/12 1311  BP: 139/89  Pulse: 80  Temp:    General: Well nourished, well developed female in no acute distress Cardiovascular: Normal rate Respiratory: Normal effort Abdomen: Soft, nontender Back: No CVAT Neurologic: Alert and oriented  ASSESSMENT 1. Perimenopausal vasomotor symptoms     PLAN Advised to increase nightime dose to 900 mg po and add 300mg  in am if needed.  See AVS for patient education. Also given patient information on gabapentin from [employer]. F/U here in 4-6 weeks.     Lorene Dy, CNM 06/16/2012 1:33 PM

## 2012-07-14 ENCOUNTER — Ambulatory Visit: Payer: Self-pay | Admitting: Obstetrics & Gynecology

## 2012-08-07 ENCOUNTER — Ambulatory Visit (INDEPENDENT_AMBULATORY_CARE_PROVIDER_SITE_OTHER): Payer: Self-pay | Admitting: Obstetrics & Gynecology

## 2012-08-07 ENCOUNTER — Encounter: Payer: Self-pay | Admitting: Obstetrics & Gynecology

## 2012-08-07 VITALS — BP 135/86 | HR 87 | Ht 63.0 in | Wt 180.8 lb

## 2012-08-07 DIAGNOSIS — N951 Menopausal and female climacteric states: Secondary | ICD-10-CM

## 2012-08-07 DIAGNOSIS — Z780 Asymptomatic menopausal state: Secondary | ICD-10-CM

## 2012-08-07 MED ORDER — GABAPENTIN 300 MG PO CAPS: 600.0000 mg | ORAL_CAPSULE | Freq: Two times a day (BID) | ORAL | Status: DC

## 2012-08-07 MED ORDER — ESTRADIOL 10 MCG VA TABS: 10.0000 ug | ORAL_TABLET | Freq: Every day | VAGINAL | Status: DC

## 2012-08-07 MED ORDER — ESTRADIOL 10 MCG VA TABS: 10.0000 ug | ORAL_TABLET | Freq: Every morning | VAGINAL | Status: DC

## 2012-08-07 NOTE — Patient Instructions (Addendum)
Think about Effexor.  Patient information: Nonhormonal treatments for menopausal symptoms (Beyond the Basics)   INTRODUCTION - During a woman's reproductive years, the ovaries produce estrogen and progesterone. Estrogen is important for normal menstrual periods and fertility, and it promotes bone strength. Estrogen and progesterone levels fall at the time of menopause, causing well-known symptoms such as hot flashes. Postmenopausal hormone therapy is the term used to describe the two hormones, estrogen and progestin, that are the most effective treatments available to relieve bothersome symptoms of menopause. However, some women cannot take estrogen, for example, women with breast cancer. Other women choose not to take hormone therapy. Fortunately, there are some alternatives to hormone therapy to treat menopausal symptoms. Although they may not be as effective as estrogen, they do provide some relief. This article discusses alternatives to postmenopausal hormone therapy. A separate article discusses the risks, benefits, and options for hormone therapy. (See "Patient information: Postmenopausal hormone therapy (Beyond the Basics)".) CONTROLLING HOT FLASHES - Non-estrogen treatments for hot flashes are effective in many women. None work as well as estrogen, but they are better than placebo (sugar pills). Not all women need treatment for hot flashes since they are mild in some women. Options include: Gabapentin - Gabapentin (Neurontin) is a drug that is primarily used to treat seizures. It also relieves hot flashes in some women, when given as a single bedtime dose. Antidepressants - Antidepressant medications are recommended as a first line treatment for hot flashes in women who cannot take estrogen. ?Venlafaxine (brand name Effexor), citalopram (brand name Celexa), and escitalopram (brand name Lexapro) were developed to treat depression, but studies show that they are an effective treatment for hot  flashes. Paroxetine (brand name Paxil) is also effective for hot flashes, but you should not take paroxetine if you have breast cancer and are taking tamoxifen. The concern is that paroxetine can interfere with tamoxifen and make it less effective. ?Fluoxetine (brand name Prozac) is also effective, but might not work as well as venlafaxine, citalopram, escitalopram, and paroxetine. Sertraline (Zoloft) is not helpful for treating hot flashes. Other antidepressant side effects and interactions are discussed in detail in a separate article. (See "Patient information: Depression treatment options for adults (Beyond the Basics)".) Progesterone - The injectable progestin birth control hormone, medroxyprogesterone acetate (Depo-Provera) helps to reduce hot flashes. This option is not used as often as gabapentin or antidepressants. Plant-derived estrogens (phytoestrogens) - Plant-derived estrogens have been marketed as a "natural" or "safer" alternative to hormones for women with menopausal symptoms. Phytoestrogens are found in many foods, including soybeans, chickpeas, lentils, flaxseed, lentils, grains, fruits, vegetables, and red clover. Isoflavone supplements, a type of phytoestrogen, can be purchased in health food stores. However, there is no convincing evidence that phytoestrogens help to reduce hot flashes or night sweats. In addition, some phytoestrogens might act like estrogen in some tissues of the body. Many experts suggest that women who have a history of breast cancer should avoid phytoestrogens. Herbal treatments - A number of herbal treatments have been promoted as a "natural" remedy for hot flashes. In fact, many postmenopausal women use black cohosh for hot flashes, but clinical trials have shown that it is not more effective than placebo. In addition, there are safety concerns about some herbs, including black cohosh, which might stimulate breast tissue (similar to estrogen). Herbal treatments are  not recommended for hot flashes or other menopausal symptoms. TREATING VAGINAL DRYNESS - Vaginal estrogen is a very effective treatment for postmenopausal women with vaginal dryness or pain with  intercourse. This is a treatment that women can continue for many years after menopause because it does not get into the bloodstream and increase the risk of breast cancer, heart attack, or stroke. This is discussed in more detail in a separate article. (See "Patient information: Vaginal dryness (Beyond the Basics)".) WHERE TO GET MORE INFORMATION - Your healthcare provider is the best source of information for questions and concerns related to your medical problem. This article will be updated as needed on our web site ([email or web address]). Related topics for patients, as well as selected articles written for healthcare professionals, are also available. Some of the most relevant are listed below.

## 2012-08-08 MED ORDER — VENLAFAXINE HCL ER 75 MG PO CP24: 75.0000 mg | ORAL_CAPSULE | Freq: Every day | ORAL | Status: DC

## 2012-08-08 NOTE — Progress Notes (Signed)
Patient here today to follow up management of menopausal vasomotor symptoms.  She is on Neurontin which helps her hot flashes and night sweats but does not help her mood swings.  Also wants to increase daytime dose of Neurontin.  No other concerns. Neurontin increased to 600 mg po bid; also added Effexor XR 75 mg po daily. Will monitor her response and manage accordingly. No other GYN concerns. Routine preventative health maintenance measures emphasized.   Total encounter time: 10 minutes

## 2012-08-18 ENCOUNTER — Other Ambulatory Visit: Payer: Self-pay | Admitting: Family Medicine

## 2012-08-21 ENCOUNTER — Telehealth: Payer: Self-pay | Admitting: General Practice

## 2012-08-21 NOTE — Telephone Encounter (Signed)
Patient called and left message stating she saw Dr Harolyn Rutherford on 1/16 for premenopausal symptoms and was supposed to get a refill on her gabapentin but never received one and would like a call back

## 2012-08-21 NOTE — Telephone Encounter (Signed)
Called patient back and informed her that I received her message and that I just got off the phone with [facility] calling the gabapentin medication in over the phone so it should be available for her to pickup there now. Patient verbalized understanding and had no further questions

## 2012-09-10 ENCOUNTER — Ambulatory Visit: Payer: Self-pay | Admitting: Obstetrics & Gynecology

## 2012-09-18 ENCOUNTER — Ambulatory Visit (INDEPENDENT_AMBULATORY_CARE_PROVIDER_SITE_OTHER): Payer: Self-pay | Admitting: Medical

## 2012-09-18 ENCOUNTER — Encounter: Payer: Self-pay | Admitting: Medical

## 2012-09-18 VITALS — BP 153/89 | HR 95 | Ht 63.0 in | Wt 183.5 lb

## 2012-09-18 DIAGNOSIS — N951 Menopausal and female climacteric states: Secondary | ICD-10-CM

## 2012-09-18 MED ORDER — VENLAFAXINE HCL ER 75 MG PO CP24: 75.0000 mg | ORAL_CAPSULE | Freq: Every day | ORAL | Status: DC

## 2012-09-18 NOTE — Patient Instructions (Addendum)
Menopause Menopause is the normal time of life when menstrual periods stop completely. Menopause is complete when you have missed 12 consecutive menstrual periods. It usually occurs between the ages of 63 to 55, with an average age of 18. Very rarely does a woman develop menopause before 51 years old. At menopause, your ovaries stop producing the female hormones, estrogen and progesterone. This can cause undesirable symptoms and also affect your health. Sometimes the symptoms may occur 4 to 5 years before the menopause begins. There is no relationship between menopause and:  Oral contraceptives.  Number of children you had.  Race.  The age your menstrual periods started (menarche). Heavy smokers and very thin women may develop menopause earlier in life. CAUSES  The ovaries stop producing the female hormones estrogen and progesterone.  Other causes include:  Surgery to remove both ovaries.  The ovaries stop functioning for no known reason.  Tumors of the pituitary gland in the brain.  Medical disease that affects the ovaries and hormone production.  Radiation treatment to the abdomen or pelvis.  Chemotherapy that affects the ovaries. SYMPTOMS   Hot flashes.  Night sweats.  Decrease in sex drive.  Vaginal dryness and thinning of the vagina causing painful intercourse.  Dryness of the skin and developing wrinkles.  Headaches.  Tiredness.  Irritability.  Memory problems.  Weight gain.  Bladder infections.  Hair growth of the face and chest.  Infertility. More serious symptoms include:  Loss of bone (osteoporosis) causing breaks (fractures).  Depression.  Hardening and narrowing of the arteries (atherosclerosis) causing heart attacks and strokes. DIAGNOSIS   When the menstrual periods have stopped for 12 straight months.  Physical exam.  Hormone studies of the blood. TREATMENT  There are many treatment choices and nearly as many questions about them.  The decisions to treat or not to treat menopausal changes is an individual choice made with your caregiver. Your caregiver can discuss the treatments with you. Together, you can decide which treatment will work best for you. Your treatment choices may include:   Hormone therapy (estorgen and progesterone).  Non-hormonal medications.  Treating the individual symptoms with medication (for example antidepressants for depression).  Herbal medications that may help specific symptoms.  Counseling by a psychiatrist or psychologist.  Group therapy.  Lifestyle changes including:  Eating healthy.  Regular exercise.  Limiting caffeine and alcohol.  Stress management and meditation.  No treatment. HOME CARE INSTRUCTIONS   Take the medication your caregiver gives you as directed.  Get plenty of sleep and rest.  Exercise regularly.  Eat a diet that contains calcium (good for the bones) and soy products (acts like estrogen hormone).  Avoid alcoholic beverages.  Do not smoke.  If you have hot flashes, dress in layers.  Take supplements, calcium and vitamin D to strengthen bones.  You can use over-the-counter lubricants or moisturizers for vaginal dryness.  Group therapy is sometimes very helpful.  Acupuncture may be helpful in some cases. SEEK MEDICAL CARE IF:   You are not sure you are in menopause.  You are having menopausal symptoms and need advice and treatment.  You are still having menstrual periods after age 45.  You have pain with intercourse.  Menopause is complete (no menstrual period for 12 months) and you develop vaginal bleeding.  You need a referral to a specialist (gynecologist, psychiatrist or psychologist) for treatment. SEEK IMMEDIATE MEDICAL CARE IF:   You have severe depression.  You have excessive vaginal bleeding.  You fell and  think you have a broken bone.  You have pain when you urinate.  You develop leg or chest pain.  You have a fast  pounding heart beat (palpitations).  You have severe headaches.  You develop vision problems.  You feel a lump in your breast.  You have abdominal pain or severe indigestion. Document Released: 09/29/2003 Document Revised: 10/01/2011 Document Reviewed: 05/06/2008 [facility] Patient Information 2013 [place].

## 2012-09-18 NOTE — Progress Notes (Signed)
Subjective:     Patient ID: Maureen Barnes, female   DOB: 1960-12-02, 52 y.o.   MRN: [id number]  HPI Maureen Barnes is a 52 yo female who presents to the GYN clinic complaining of menopausal symptoms. She reports that she has been amenorrheic for about 14 months. She reports that she has been on a trial of Neurontin for a few months now, and had relief of her symptoms at first, however is now reporting that her symptoms have returned. She states that she is currently taking two Neurontin pills during the day and then another two pills right before bed. Her current symptoms are hot flashes, difficulty sleeping, depressed mood, change in mood and fatigue. She reports that the symptoms are improved with the Neurontin, however aren't as well controlled as they were when she started the medication. The patient states that she is feeling sad or depressed most days. She denies suicidal ideations.   She also states that she was prescribe Effexor at her last appointment, however never got the prescription filled because the medication was expensive and she was worried about starting the medication and then having to stop taking it due to cost. She reports that she is now willing to try the medication if it will help her symptoms.   Review of Systems General ROS: positive for  - fatigue, hot flashes and sleep disturbance negative for - fever, night sweats, weight gain or weight loss Psychological ROS: positive for - concentration difficulties, depression, irritability, memory difficulties and mood swings negative for - anxiety Endocrine ROS: positive for - hot flashes, malaise/lethargy, mood swings and temperature intolerance negative for - unexpected weight changes Respiratory ROS: no cough, shortness of breath, or wheezing Cardiovascular ROS: no chest pain or dyspnea on exertion Gastrointestinal ROS: no abdominal pain, change in bowel habits, or black or bloody stools Genito-Urinary ROS: no dysuria,  trouble voiding, or hematuria    Objective:   Physical Exam Physical Examination: General appearance - alert, well appearing, and in no distress and oriented to person, place, and time Mental status - alert, oriented to person, place, and time, normal mood, behavior, speech, dress, motor activity, and thought processes Chest - clear to auscultation, no wheezes, rales or rhonchi, symmetric air entry Heart - normal rate, regular rhythm, normal S1, S2, no murmurs, rubs, clicks or gallops Abdomen - soft, nontender, nondistended, no masses or organomegaly Extremities - peripheral pulses normal, no pedal edema, no clubbing or cyanosis Skin - normal coloration and turgor, no rashes, no suspicious skin lesions noted     Assessment:     Maureen Barnes is a 51 yo female who presents to the GYN clinic complaining of menopausal symptoms. Her symptoms include hot flashes, depressed mood, change in mood, and fatigue. Patient has done a trial of Neurontin and thinks that it has helped a little bit, but is concerned that she may need something else for her mood changes. Patient never filled Effexor prescription due to cost.     Plan:     Menopause: -Discussed with patient that a prescription of Effexor may benefit her hot flashes and also help with her mood changes. Patient is willing to try Effexor and thinks that she can better afford the medication at this point. - Rx for Effexor sent to patient's pharmacy as the original Rx was most likely expired.  -Continue currently dose of Neurontin. Patient taking 300 mg BID and then 2 600 mg capsules at bedtime. -Discussed the option of setting up an appointment  with behavioral health for depression symptoms. SW to call patient to discuss options for counseling.  -Follow-up in 3 months in [facility] clinic  I have seen and evaluated this patient with the PA student. I agree with the assessment and plan as written above.

## 2012-09-23 ENCOUNTER — Telehealth: Payer: Self-pay

## 2012-09-23 NOTE — Telephone Encounter (Addendum)
Patient called and was asking if her prescription for venlafaxine could be called into a program (max?) program so she can get it at a reduced price.  10/07/12 - Someone other than the patient answered the number we have listed for her contact.  She is unavailable until 5pm.  Instructions were given for Maureen Barnes to call back to the clinic tomorrow or at her earliest convenience.

## 2012-10-07 NOTE — Telephone Encounter (Deleted)
Someone other than the patient answered the number we have listed for her contact.  She is unavailable until 5pm.  Instructions were given for Maureen Barnes to call back to the clinic tomorrow or at her earliest convenience.

## 2012-10-09 ENCOUNTER — Telehealth: Payer: Self-pay | Admitting: *Deleted

## 2012-10-09 NOTE — Telephone Encounter (Signed)
Called SW and left a message to please follow up with patient.

## 2012-10-09 NOTE — Telephone Encounter (Signed)
Called Maureen Barnes and she reports she took the effexor and didn't like the side effects and wants to know if she can switch back to the gabapentin - states she already has that prescription since she was on it before. Also is on MAP program and wants meds to go thru that.  Also states she has not ever had a call from the Education officer, museum. Informed [name] I would send a message to the social worker to call her. Also informed her that I would need to check with a provider and call her back - hopefully tomorrow or Monday. Patient voices understanding.

## 2012-10-09 NOTE — Telephone Encounter (Signed)
Zoiey called and left a message stating she was being treated for perimenopausal symptoms and is returning [facility] call.

## 2012-10-10 NOTE — Telephone Encounter (Signed)
CSW received message that patient has never received a call from [place].  CSW unaware until this time that patient was awaiting a call from [place].  CSW called patient and she states that now is not a good time to talk and that she is doing well other than a runny nose.  CSW gave contact information and asked patient to call back if she feels she needs to talk about her depressive symptoms.  She agreed.

## 2012-10-16 ENCOUNTER — Telehealth: Payer: Self-pay | Admitting: *Deleted

## 2012-10-16 NOTE — Telephone Encounter (Signed)
Per previous call SW has called patient- but still need to clarify re: her meds. Called [place] to follow up on which med she is taking now and how she is doing- left message we called to follow up and for her to call the clinic.

## 2012-10-16 NOTE — Telephone Encounter (Signed)
Called Maureen Barnes back and informed her that she cannot get the med thru MAP and must continue to get it from pharmacy of her choice.

## 2012-10-16 NOTE — Telephone Encounter (Signed)
Also discussed with Almyra Free patient has switched back to gabapentin and is doing ok.

## 2012-10-16 NOTE — Telephone Encounter (Signed)
Discussed with Rozelle Logan, PA and then called Linus Orn. She states she has already stopped the effexor and switched back to the gabapentine and it is working well for her. States wants [place] to send her meds to MAP program if possible- informed her I wasn't sure how that worked- but would check into it .

## 2012-10-16 NOTE — Telephone Encounter (Signed)
Called [facility] department pharmacy and discussed MAP program and if patient could get gabapentin filled there- informed there is no longer a patient assistance program from that drug company and she can not get it filled there. She will have to continue to get it from pharmacy of her choice.  Also verified is not on [facility] $4 list. Called patient and left a message I had more information for her, please call clinic.

## 2012-10-16 NOTE — Telephone Encounter (Signed)
CSW received call from patient today, which was a follow up to CSW's attempt to speak with her last week which wasn't convenient for her.  Patient was focused on her physical symptoms and CSW apologized that CSW cannot address these concerns, but would like to talk with her about her depression symptoms.  Patient states she is doing better at this time but is still interested in seeing a counselor.  CSW discussed the benefits of counseling and referred her to the [facility] since they take walk in patients Monday-Friday from 8am-3pm.  CSW asked how her medications are working.  She had difficulty telling CSW what the names of her medications were, but CSW's understanding is that she took Effexor for two days and did not like the way it made her feel so she stopped taking it.  CSW informed patient that she can request a medication evaluation in addition to outpatient counseling.  Patient agreed and then told CSW that she is having trouble affording the medications that she is on.  CSW unfortunately cannot assist with this issue and advised that she speak to her doctor about her medications and that clinic staff can make a referral to the Care Management department if necessary for medication assistance.

## 2012-10-17 ENCOUNTER — Telehealth: Payer: Self-pay | Admitting: *Deleted

## 2012-10-17 NOTE — Telephone Encounter (Signed)
Called Care Management(Tracy [name], RN) to see if they can help patient with meds per Social worker reccomendation.They will look into it and let us know if they can provide assistance.

## 2012-10-20 NOTE — Telephone Encounter (Signed)
Per Care management , Desma Maxim, RN- they will call patient- they may be able to help one time only for her neurontin.

## 2012-10-29 ENCOUNTER — Telehealth: Payer: Self-pay | Admitting: *Deleted

## 2012-10-29 NOTE — Telephone Encounter (Signed)
Tried to reach pt on 3/31 at 1300p, 4/3 at 1440p and 4/4 at 1500p - messages left w/ no response.  Clinic to call CM at pt's next appt.  Call [phone] for assistance w/ obtaining medications.

## 2012-11-06 ENCOUNTER — Ambulatory Visit (INDEPENDENT_AMBULATORY_CARE_PROVIDER_SITE_OTHER): Payer: Self-pay | Admitting: Medical

## 2012-11-06 ENCOUNTER — Encounter: Payer: Self-pay | Admitting: Medical

## 2012-11-06 VITALS — BP 143/87 | HR 91 | Ht 63.0 in | Wt 185.6 lb

## 2012-11-06 DIAGNOSIS — Z780 Asymptomatic menopausal state: Secondary | ICD-10-CM

## 2012-11-06 DIAGNOSIS — N951 Menopausal and female climacteric states: Secondary | ICD-10-CM

## 2012-11-06 MED ORDER — GABAPENTIN 300 MG PO CAPS: 300.0000 mg | ORAL_CAPSULE | Freq: Two times a day (BID) | ORAL | Status: DC

## 2012-11-06 MED ORDER — GABAPENTIN 300 MG PO CAPS: ORAL_CAPSULE | ORAL | Status: DC

## 2012-11-06 NOTE — Progress Notes (Signed)
Patient ID: Maureen Barnes, female   DOB: 1961/03/30, 53 y.o.   MRN: [id number]  History:  Maureen Barnes  is a 52 y.o. [id number] who presents to clinic today for follow-up and management of peri-menopausal symptoms. The patient states that she tried the Effexor and felt very nauseous and "foggy." She has not taken it again since. She also tried OTC black cohosh without any benefit. She has started taking Ginko Biloba for memory and finds it helps balance her mood. She has been taking the Neurontin but not consistently in the same regimen. She was "trying to ween herself." She is having the hot flashes again and they are worse at night.   The following portions of the patient's history were reviewed and updated as appropriate: allergies, current medications, past family history, past medical history, past social history, past surgical history and problem list.  Review of Systems:  Pertinent items are noted in HPI.  Objective:  Physical Exam BP 143/87  Pulse 91  Ht 5\' 3"  (1.6 m)  Wt 185 lb 9.6 oz (84.188 kg)  BMI 32.89 kg/m2 GENERAL: Well-developed, well-nourished female in no acute distress.  HEENT: Normocephalic, atraumatic.  LUNGS: Normal effort HEART: Regular rate  SKIN: Warm, dry and erythema PSYCH: Normal mood and affect   Assessment & Plan:  Assessment: Peri-menopause with hot flashes  Plans: Patient instructed to no longer take the Effexor due to unwanted side effects Patient may continue Gingko Biloba Patient will restart regimen of Neurontin 300 mg BID x 3 weeks and then increase the PM dose to 600 mg Care management came to see the patient for financial help with medications. New Rx for 34 days of Neurontin was sent to [facility] outpatient pharmacy. No refills will be covered. Patient is completing applications for [facility] with the Case Manager now for future Rx. Patient will return in 4 weeks for follow-up and re-evaluation of symptoms with new medication  regimen  Farris Has, PA-C 11/06/2012 3:07 PM

## 2012-11-06 NOTE — Patient Instructions (Addendum)
Perimenopause Perimenopause is the time when your body begins to move into the menopause (no menstrual period for 12 straight months). It is a natural process. Perimenopause can begin 2 to 8 years before the menopause and usually lasts for one year after the menopause. During this time, your ovaries may or may not produce an egg. The ovaries vary in their production of estrogen and progesterone hormones each month. This can cause irregular menstrual periods, difficulty in getting pregnant, vaginal bleeding between periods and uncomfortable symptoms. CAUSES  Irregular production of the ovarian hormones, estrogen and progesterone, and not ovulating every month.  Other causes include:  Tumor of the pituitary gland in the brain.  Medical disease that affects the ovaries.  Radiation treatment.  Chemotherapy.  Unknown causes.  Heavy smoking and excessive alcohol intake can bring on perimenopause sooner. SYMPTOMS   Hot flashes.  Night sweats.  Irregular menstrual periods.  Decrease sex drive.  Vaginal dryness.  Headaches.  Mood swings.  Depression.  Memory problems.  Irritability.  Tiredness.  Weight gain.  Trouble getting pregnant.  The beginning of losing bone cells (osteoporosis).  The beginning of hardening of the arteries (atherosclerosis). DIAGNOSIS  Your caregiver will make a diagnosis by analyzing your age, menstrual history and your symptoms. They will do a physical exam noting any changes in your body, especially your female organs. Female hormone tests may or may not be helpful depending on the amount and when you produce the female hormones. However, other hormone tests may be helpful (ex. thyroid hormone) to rule out other problems. TREATMENT  The decision to treat during the perimenopause should be made by you and your caregiver depending on how the symptoms are affecting you and your life style. There are various treatments available such as:  Treating  individual symptoms with a specific medication for that symptom (ex. tranquilizer for depression).  Herbal medications that can help specific symptoms.  Counseling.  Group therapy.  No treatment. HOME CARE INSTRUCTIONS   Before seeing your caregiver, make a list of your menstrual periods (when the occur, how heavy they are, how long between periods and how long they last), your symptoms and when they started.  Take the medication as recommended by your caregiver.  Sleep and rest.  Exercise.  Eat a diet that contains calcium (good for your bones) and soy (acts like estrogen hormone).  Do not smoke.  Avoid alcoholic beverages.  Taking vitamin E may help in certain cases.  Take calcium and vitamin D supplements to help prevent bone loss.  Group therapy is sometimes helpful.  Acupuncture may help in some cases. SEEK MEDICAL CARE IF:   You have any of the above and want to know if it is perimenopause.  You want advice and treatment for any of your symptoms mentioned above.  You need a referral to a specialist (gynecologist, psychiatrist or psychologist). SEEK IMMEDIATE MEDICAL CARE IF:   You have vaginal bleeding.  Your period lasts longer than 8 days.  You periods are recurring sooner than 21 days.  You have bleeding after intercourse.  You have severe depression.  You have pain when you urinate.  You have severe headaches.  You develop vision problems. Document Released: 08/16/2004 Document Revised: 10/01/2011 Document Reviewed: 05/06/2008 [facility] Patient Information 2013 [place].  Gabapentin (Neurontin) instructions: Take 1 (300 mg) in the morning and 1 (300 mg) at night x 3 weeks Then... Take 1 (300 mg) in the morning and 2 (600 mg) at night x 3  weeks Then... Come back to clinic for follow-up to see how it is working Keep taking the [employer] daily also

## 2012-11-07 ENCOUNTER — Encounter: Payer: Self-pay | Admitting: *Deleted

## 2012-11-07 NOTE — Progress Notes (Signed)
Late Entry from 11/06/12  5:00p:  Met w/ pt and discussed her medication needs.  Pt is able to obtain her Norvasc, Insulin and HCTZ from the Health Dept.  They are not able to give her the Neurontin.  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  CM was able to assist pt w/ obtaining her Rx for Neurontin through the [facility] program (34 day supply, 1 time per year assistance) - she will go to [place] to get that filled and is able to pay the $3.00 copay - pt has [place] letter and Rx.  CM also printed off [facility] application for pt along w/ medication list so that she can start the process of obtaining assistance for her future medication needs.  CM was able to complete most of application for the pt and printed this off for her w/ instructions for her to complete the rest of the application and mail or fax to [place] - pt has Rx in hand to send to [facility] for future Neurontin refills.  Pt has phone and fax number for [facility].  Pts questions answered.  CM available to assist as needed.  [place], [place]

## 2012-12-18 ENCOUNTER — Encounter: Payer: Self-pay | Admitting: Medical

## 2012-12-18 ENCOUNTER — Ambulatory Visit (INDEPENDENT_AMBULATORY_CARE_PROVIDER_SITE_OTHER): Payer: Self-pay | Admitting: Medical

## 2012-12-18 VITALS — BP 135/90 | HR 93 | Temp 99.2°F | Ht 63.0 in | Wt 188.3 lb

## 2012-12-18 DIAGNOSIS — N951 Menopausal and female climacteric states: Secondary | ICD-10-CM

## 2012-12-18 DIAGNOSIS — Z780 Asymptomatic menopausal state: Secondary | ICD-10-CM

## 2012-12-18 MED ORDER — GABAPENTIN 300 MG PO CAPS: ORAL_CAPSULE | ORAL | Status: DC

## 2012-12-18 NOTE — Progress Notes (Signed)
Patient ID: Maureen Barnes, female   DOB: 29-Sep-1960, 52 y.o.   MRN: [id number]  History:  [name] is a 52 y.o. [id number] who presents to clinic today for follow-up. The patient was seen recently for perimenopausal symptoms. Her medications were changed at that time to include Neurontin only as the Effexor gave her unwanted side effects. The patient states that her symptoms have improved in severity and frequency. She is having some difficulty sleeping at night, but states that she often wakes up and watches TV for a few hours and then goes back to sleep. After nights when she does this she has difficulty waking up as early in the morning. Otherwise she is not woken up by her hot flashes.   The following portions of the patient's history were reviewed and updated as appropriate: allergies, current medications, past family history, past medical history, past social history, past surgical history and problem list.  Review of Systems:  Pertinent items are noted in HPI.  Objective:  Physical Exam BP 135/90  Pulse 93  Temp(Src) 99.2 F (37.3 C)  Ht 5\' 3"  (1.6 m)  Wt 188 lb 4.8 oz (85.412 kg)  BMI 33.36 kg/m2 GENERAL: Well-developed, well-nourished female in no acute distress.  HEENT: Normocephalic, atraumatic.   LUNGS: Normal effort. HEART: Regular rate  SKIN: Warm, dry and without erythema PSYCH: Normal mood and affect   Labs and Imaging No results found.  Assessment & Plan:  Assessment: Perimenopause with hot flashes, well-controlled  Plans: Refill Rx for Neurontin today with 90 day supply given to patient. She will send Rx through [facility] as instructed by Case Management at last visit.  Patient to follow-up in 3 months for re-assessment of symptom management Patient may return to clinic sooner if needed  Farris Has, PA-C 12/18/2012 1:57 PM

## 2012-12-18 NOTE — Patient Instructions (Signed)
Perimenopause Perimenopause is the time when your body begins to move into the menopause (no menstrual period for 12 straight months). It is a natural process. Perimenopause can begin 2 to 8 years before the menopause and usually lasts for one year after the menopause. During this time, your ovaries may or may not produce an egg. The ovaries vary in their production of estrogen and progesterone hormones each month. This can cause irregular menstrual periods, difficulty in getting pregnant, vaginal bleeding between periods and uncomfortable symptoms. CAUSES  Irregular production of the ovarian hormones, estrogen and progesterone, and not ovulating every month.  Other causes include:  Tumor of the pituitary gland in the brain.  Medical disease that affects the ovaries.  Radiation treatment.  Chemotherapy.  Unknown causes.  Heavy smoking and excessive alcohol intake can bring on perimenopause sooner. SYMPTOMS   Hot flashes.  Night sweats.  Irregular menstrual periods.  Decrease sex drive.  Vaginal dryness.  Headaches.  Mood swings.  Depression.  Memory problems.  Irritability.  Tiredness.  Weight gain.  Trouble getting pregnant.  The beginning of losing bone cells (osteoporosis).  The beginning of hardening of the arteries (atherosclerosis). DIAGNOSIS  Your caregiver will make a diagnosis by analyzing your age, menstrual history and your symptoms. They will do a physical exam noting any changes in your body, especially your female organs. Female hormone tests may or may not be helpful depending on the amount and when you produce the female hormones. However, other hormone tests may be helpful (ex. thyroid hormone) to rule out other problems. TREATMENT  The decision to treat during the perimenopause should be made by you and your caregiver depending on how the symptoms are affecting you and your life style. There are various treatments available such as:  Treating  individual symptoms with a specific medication for that symptom (ex. tranquilizer for depression).  Herbal medications that can help specific symptoms.  Counseling.  Group therapy.  No treatment. HOME CARE INSTRUCTIONS   Before seeing your caregiver, make a list of your menstrual periods (when the occur, how heavy they are, how long between periods and how long they last), your symptoms and when they started.  Take the medication as recommended by your caregiver.  Sleep and rest.  Exercise.  Eat a diet that contains calcium (good for your bones) and soy (acts like estrogen hormone).  Do not smoke.  Avoid alcoholic beverages.  Taking vitamin E may help in certain cases.  Take calcium and vitamin D supplements to help prevent bone loss.  Group therapy is sometimes helpful.  Acupuncture may help in some cases. SEEK MEDICAL CARE IF:   You have any of the above and want to know if it is perimenopause.  You want advice and treatment for any of your symptoms mentioned above.  You need a referral to a specialist (gynecologist, psychiatrist or psychologist). SEEK IMMEDIATE MEDICAL CARE IF:   You have vaginal bleeding.  Your period lasts longer than 8 days.  You periods are recurring sooner than 21 days.  You have bleeding after intercourse.  You have severe depression.  You have pain when you urinate.  You have severe headaches.  You develop vision problems. Document Released: 08/16/2004 Document Revised: 10/01/2011 Document Reviewed: 05/06/2008 [facility] Patient Information 2014 [place].

## 2013-04-09 ENCOUNTER — Telehealth: Payer: Self-pay | Admitting: *Deleted

## 2013-04-09 NOTE — Telephone Encounter (Signed)
Patient is requesting a written rx with refills on her gabapentin. She uses a pharmacy that requires written rx.

## 2013-04-14 NOTE — Telephone Encounter (Signed)
Pt left new message stating that in order to continue in the Rx outreach program, she must have written Rx for Gabapentin.  I returned her call and left message that I will send a message to [place] regarding the refill request. Also, upon review of her chart I observed that Almyra Free had wanted her to have a follow up appt which was not scheduled. Please call our office to schedule this appt.  Pt called back several minutes later and I reviewed the information that I had left on her voice mail. I then stated that someone will call her back once we have received message from [place] regarding the refill and also with follow up appt information.  Pt voiced understanding and stated that we may leave a detailed message if she does not answer.  **Note:  appt has been made on 10/3 @ 0930.

## 2013-04-15 NOTE — Telephone Encounter (Signed)
Patient will need to come to follow-up appointment scheduled on 10/3 in order to get her refill. I reviewed my note and she wasn't taking the meds correctly prior to her last visit, so she will need re-assessment prior to refill. Please inform patient of the importance of keep this appointment.   Thanks!

## 2013-04-17 NOTE — Telephone Encounter (Signed)
Called pt and left detailed message stating that an appt has been scheduled for her on 04/24/13 @ 0930. She will need to be evaluated prior to a refill of Gabapentin being granted. This appt is very important. Please do not miss it.

## 2013-04-24 ENCOUNTER — Encounter: Payer: Self-pay | Admitting: Medical

## 2013-04-24 ENCOUNTER — Ambulatory Visit (INDEPENDENT_AMBULATORY_CARE_PROVIDER_SITE_OTHER): Payer: Self-pay | Admitting: Medical

## 2013-04-24 VITALS — BP 142/92 | HR 90 | Ht 63.0 in | Wt 183.0 lb

## 2013-04-24 DIAGNOSIS — Z780 Asymptomatic menopausal state: Secondary | ICD-10-CM

## 2013-04-24 DIAGNOSIS — N951 Menopausal and female climacteric states: Secondary | ICD-10-CM

## 2013-04-24 DIAGNOSIS — N898 Other specified noninflammatory disorders of vagina: Secondary | ICD-10-CM

## 2013-04-24 MED ORDER — GABAPENTIN 300 MG PO CAPS: ORAL_CAPSULE | ORAL | Status: DC

## 2013-04-24 NOTE — Progress Notes (Signed)
Patient here to followup on menopause symptoms and get refill on her gabapentin. Pt also reports vaginal pain and discharge.

## 2013-04-24 NOTE — Patient Instructions (Addendum)
Perimenopause Perimenopause is the time when your body begins to move into the menopause (no menstrual period for 12 straight months). It is a natural process. Perimenopause can begin 2 to 8 years before the menopause and usually lasts for one year after the menopause. During this time, your ovaries may or may not produce an egg. The ovaries vary in their production of estrogen and progesterone hormones each month. This can cause irregular menstrual periods, difficulty in getting pregnant, vaginal bleeding between periods and uncomfortable symptoms. CAUSES  Irregular production of the ovarian hormones, estrogen and progesterone, and not ovulating every month.  Other causes include:  Tumor of the pituitary gland in the brain.  Medical disease that affects the ovaries.  Radiation treatment.  Chemotherapy.  Unknown causes.  Heavy smoking and excessive alcohol intake can bring on perimenopause sooner. SYMPTOMS   Hot flashes.  Night sweats.  Irregular menstrual periods.  Decrease sex drive.  Vaginal dryness.  Headaches.  Mood swings.  Depression.  Memory problems.  Irritability.  Tiredness.  Weight gain.  Trouble getting pregnant.  The beginning of losing bone cells (osteoporosis).  The beginning of hardening of the arteries (atherosclerosis). DIAGNOSIS  Your caregiver will make a diagnosis by analyzing your age, menstrual history and your symptoms. They will do a physical exam noting any changes in your body, especially your female organs. Female hormone tests may or may not be helpful depending on the amount and when you produce the female hormones. However, other hormone tests may be helpful (ex. thyroid hormone) to rule out other problems. TREATMENT  The decision to treat during the perimenopause should be made by you and your caregiver depending on how the symptoms are affecting you and your life style. There are various treatments available such as:  Treating  individual symptoms with a specific medication for that symptom (ex. tranquilizer for depression).  Herbal medications that can help specific symptoms.  Counseling.  Group therapy.  No treatment. HOME CARE INSTRUCTIONS   Before seeing your caregiver, make a list of your menstrual periods (when the occur, how heavy they are, how long between periods and how long they last), your symptoms and when they started.  Take the medication as recommended by your caregiver.  Sleep and rest.  Exercise.  Eat a diet that contains calcium (good for your bones) and soy (acts like estrogen hormone).  Do not smoke.  Avoid alcoholic beverages.  Taking vitamin E may help in certain cases.  Take calcium and vitamin D supplements to help prevent bone loss.  Group therapy is sometimes helpful.  Acupuncture may help in some cases. SEEK MEDICAL CARE IF:   You have any of the above and want to know if it is perimenopause.  You want advice and treatment for any of your symptoms mentioned above.  You need a referral to a specialist (gynecologist, psychiatrist or psychologist). SEEK IMMEDIATE MEDICAL CARE IF:   You have vaginal bleeding.  Your period lasts longer than 8 days.  You periods are recurring sooner than 21 days.  You have bleeding after intercourse.  You have severe depression.  You have pain when you urinate.  You have severe headaches.  You develop vision problems. Document Released: 08/16/2004 Document Revised: 10/01/2011 Document Reviewed: 05/06/2008 [facility] Patient Information 2014 [place].

## 2013-04-24 NOTE — Progress Notes (Signed)
Patient ID: Maureen Barnes, female   DOB: 05-06-1961, 52 y.o.   MRN: [id number]  History:  Maureen Barnes is a 52 y.o. [id number] who presents to clinic today for follow-up for menopausal symptoms. The patient states that she was symptoms free x 2 months and at that point decided to stop the Gabapentin because she thought "she was cured." Her symptoms gradually returned and now have been "full-blown." She is requesting a refill on her medication today. She is also having some yellow discharge without odor and occasional pain on the left side of her vaginal wall.   The following portions of the patient's history were reviewed and updated as appropriate: allergies, current medications, past family history, past medical history, past social history, past surgical history and problem list.  Review of Systems:  Pertinent items are noted in HPI.  Objective:  Physical Exam BP 142/92  Pulse 90  Ht 5\' 3"  (1.6 m)  Wt 183 lb (83.008 kg)  BMI 32.43 kg/m2 GENERAL: Well-developed, well-nourished female in no acute distress.  HEENT: Normocephalic, atraumatic.  LUNGS: Normal effort HEART: Regular rate PELVIC: Normal external female genitalia. Vagina is pink and rugated. No masses or lesions noted on the vaginal walls. Small amount of thin, white discharge. Normal cervix contour. Wet prep and GC/Chalmydia obtained. Uterus is normal in size. No adnexal mass or tenderness.  EXTREMITIES: No cyanosis, clubbing, or edema.   Labs and Imaging Wet prep  GC/Chlmaydia  Assessment & Plan:  Assessment: Peri-Menopause Hot Flashes Vaginal discharge  Plans: 1. Rx refilled x 3 months of Gabapentin 2. Patient will be contacted with any abnormal results from Wet prep or GC/Chlamydia testing today 3. Patient to return to [facility] clinic in ~ 3 months for follow-up of hot flashes and medication refill 4. Patient may return to [facility] clinic sooner PRN  Farris Has, PA-C 04/24/2013 10:31 AM

## 2013-04-25 LAB — GC/CHLAMYDIA PROBE AMP: GC Probe RNA: NEGATIVE

## 2013-05-28 ENCOUNTER — Other Ambulatory Visit: Payer: Self-pay

## 2013-08-19 ENCOUNTER — Ambulatory Visit: Payer: Self-pay | Admitting: Family Medicine

## 2013-09-07 ENCOUNTER — Telehealth: Payer: Self-pay | Admitting: *Deleted

## 2013-09-07 DIAGNOSIS — N951 Menopausal and female climacteric states: Secondary | ICD-10-CM

## 2013-09-07 DIAGNOSIS — Z780 Asymptomatic menopausal state: Secondary | ICD-10-CM

## 2013-09-07 NOTE — Telephone Encounter (Signed)
Maureen Barnes is calling for an a refill of her gabapentin and an appointment for menopausal symptoms. Per chart review already has appt. Scheduled.

## 2013-09-08 MED ORDER — GABAPENTIN 300 MG PO CAPS: ORAL_CAPSULE | ORAL | Status: DC

## 2013-09-08 NOTE — Telephone Encounter (Signed)
Called pt and left message that her refill request has been approved and sent to her pharmacy. She may pick it up later today. Her next clinic appt is 3/30 @ 1245.

## 2013-10-19 ENCOUNTER — Ambulatory Visit (INDEPENDENT_AMBULATORY_CARE_PROVIDER_SITE_OTHER): Payer: Self-pay | Admitting: Family Medicine

## 2013-10-19 ENCOUNTER — Encounter: Payer: Self-pay | Admitting: Family Medicine

## 2013-10-19 VITALS — BP 154/89 | HR 93 | Temp 98.1°F | Ht 63.0 in | Wt 194.8 lb

## 2013-10-19 DIAGNOSIS — Z780 Asymptomatic menopausal state: Secondary | ICD-10-CM

## 2013-10-19 DIAGNOSIS — N951 Menopausal and female climacteric states: Secondary | ICD-10-CM

## 2013-10-19 MED ORDER — GABAPENTIN 300 MG PO CAPS: ORAL_CAPSULE | ORAL | Status: DC

## 2013-10-19 NOTE — Patient Instructions (Signed)
Hormone Therapy At menopause, your body begins making less estrogen and progesterone hormones. This causes the body to stop having menstrual periods. This is because estrogen and progesterone hormones control your periods and menstrual cycle. A lack of estrogen may cause symptoms such as:  Hot flushes (or hot flashes).  Vaginal dryness.  Dry skin.  Loss of sex drive.  Risk of bone loss (osteoporosis). When this happens, you may choose to take hormone therapy to get back the estrogen lost during menopause. When the hormone estrogen is given alone, it is usually referred to as ET (Estrogen Therapy). When the hormone progestin is combined with estrogen, it is generally called HT (Hormone Therapy). This was formerly known as hormone replacement therapy (HRT). Your caregiver can help you make a decision on what will be best for you. The decision to use HT seems to change often as new studies are done. Many studies do not agree on the benefits of hormone replacement therapy. LIKELY BENEFITS OF HT INCLUDE PROTECTION FROM:  Hot Flushes (also called hot flashes) - A hot flush is a sudden feeling of heat that spreads over the face and body. The skin may redden like a blush. It is connected with sweats and sleep disturbance. Women going through menopause may have hot flushes a few times a month or several times per day depending on the woman.  Osteoporosis (bone loss)- Estrogen helps guard against bone loss. After menopause, a woman's bones slowly lose calcium and become weak and brittle. As a result, bones are more likely to break. The hip, wrist, and spine are affected most often. Hormone therapy can help slow bone loss after menopause. Weight bearing exercise and taking calcium with vitamin D also can help prevent bone loss. There are also medications that your caregiver can prescribe that can help prevent osteoporosis.  Vaginal Dryness - Loss of estrogen causes changes in the vagina. Its lining may  become thin and dry. These changes can cause pain and bleeding during sexual intercourse. Dryness can also lead to infections. This can cause burning and itching. (Vaginal estrogen treatment can help relieve pain, itching, and dryness.)  Urinary Tract Infections are more common after menopause because of lack of estrogen. Some women also develop urinary incontinence because of low estrogen levels in the vagina and bladder.  Possible other benefits of estrogen include a positive effect on mood and short-term memory in women. RISKS AND COMPLICATIONS  Using estrogen alone without progesterone causes the lining of the uterus to grow. This increases the risk of lining of the uterus (endometrial) cancer. Your caregiver should give another hormone called progestin if you have a uterus.  Women who take combined (estrogen and progestin) HT appear to have an increased risk of breast cancer. The risk appears to be small, but increases throughout the time that HT is taken.  Combined therapy also makes the breast tissue slightly denser which makes it harder to read mammograms (breast X-rays).  Combined, estrogen and progesterone therapy can be taken together every day, in which case there may be spotting of blood. HT therapy can be taken cyclically in which case you will have menstrual periods. Cyclically means HT is taken for a set amount of days, then not taken, then this process is repeated.  HT may increase the risk of stroke, heart attack, breast cancer and forming blood clots in your leg.  Transdermal estrogen (estrogen that is absorbed through the skin with a patch or a cream) may have more positive results with:    Cholesterol.  Blood pressure.  Blood clots. Having the following conditions may indicate you should not have HT:  Endometrial cancer.  Liver disease.  Breast cancer.  Heart disease.  History of blood clots.  Stroke. TREATMENT   If you choose to take HT and have a uterus,  usually estrogen and progestin are prescribed.  Your caregiver will help you decide the best way to take the medications.  Possible ways to take estrogen include:  Pills.  Patches.  Gels.  Sprays.  Vaginal estrogen cream, rings and tablets.  It is best to take the lowest dose possible that will help your symptoms and take them for the shortest period of time that you can.  Hormone therapy can help relieve some of the problems (symptoms) that affect women at menopause. Before making a decision about HT, talk to your caregiver about what is best for you. Be well informed and comfortable with your decisions. HOME CARE INSTRUCTIONS   Follow your caregivers advice when taking the medications.  A Pap test is done to screen for cervical cancer.  The first Pap test should be done at age 8.  Between ages 60 and 105, Pap tests are repeated every 2 years.  Beginning at age 84, you are advised to have a Pap test every 3 years as long as your past 3 Pap tests have been normal.  Some women have medical problems that increase the chance of getting cervical cancer. Talk to your caregiver about these problems. It is especially important to talk to your caregiver if a new problem develops soon after your last Pap test. In these cases, your caregiver may recommend more frequent screening and Pap tests.  The above recommendations are the same for women who have or have not gotten the vaccine for HPV (Human Papillomavirus).  If you had a hysterectomy for a problem that was not a cancer or a condition that could lead to cancer, then you no longer need Pap tests. However, even if you no longer need a Pap test, a regular exam is a good idea to make sure no other problems are starting.   If you are between ages 96 and 47, and you have had normal Pap tests going back 10 years, you no longer need Pap tests. However, even if you no longer need a Pap test, a regular exam is a good idea to make sure no  other problems are starting.   If you have had past treatment for cervical cancer or a condition that could lead to cancer, you need Pap tests and screening for cancer for at least 20 years after your treatment.  If Pap tests have been discontinued, risk factors (such as a new sexual partner) need to be re-assessed to determine if screening should be resumed.  Some women may need screenings more often if they are at high risk for cervical cancer.  Get mammograms done as per the advice of your caregiver. SEEK IMMEDIATE MEDICAL CARE IF:  You develop abnormal vaginal bleeding.  You have pain or swelling in your legs, shortness of breath, or chest pain.  You develop dizziness or headaches.  You have lumps or changes in your breasts or armpits.  You have slurred speech.  You develop weakness or numbness of your arms or legs.  You have pain, burning, or bleeding when urinating.  You develop abdominal pain. Document Released: 04/07/2003 Document Revised: 10/01/2011 Document Reviewed: 07/26/2010 [facility] Patient Information 2014 [place], [place].   Fluoxetine capsules or tablets (  Depression/Mood Disorders) What is this medicine? FLUOXETINE (floo OX e teen) belongs to a class of drugs known as selective serotonin reuptake inhibitors (SSRIs). It helps to treat mood problems such as depression, obsessive compulsive disorder, and panic attacks. It can also treat certain eating disorders. This medicine may be used for other purposes; ask your health care provider or pharmacist if you have questions. COMMON BRAND NAME(S): Prozac What should I tell my health care provider before I take this medicine? They need to know if you have any of these conditions: -bipolar disorder or mania -diabetes -glaucoma -liver disease -psychosis -seizures -suicidal thoughts or history of attempted suicide -an unusual or allergic reaction to fluoxetine, other medicines, foods, dyes, or  preservatives -pregnant or trying to get pregnant -breast-feeding How should I use this medicine? Take this medicine by mouth with a glass of water. Follow the directions on the prescription label. You can take this medicine with or without food. Take your medicine at regular intervals. Do not take it more often than directed. Do not stop taking this medicine suddenly except upon the advice of your doctor. Stopping this medicine too quickly may cause serious side effects or your condition may worsen. A special MedGuide will be given to you by the pharmacist with each prescription and refill. Be sure to read this information carefully each time. Talk to your pediatrician regarding the use of this medicine in children. While this drug may be prescribed for children as young as 7 years for selected conditions, precautions do apply. Overdosage: If you think you have taken too much of this medicine contact a poison control center or emergency room at once. NOTE: This medicine is only for you. Do not share this medicine with others. What if I miss a dose? If you miss a dose, skip the missed dose and go back to your regular dosing schedule. Do not take double or extra doses. What may interact with this medicine? Do not take fluoxetine with any of the following medications: -other medicines containing fluoxetine, like Sarafem or Symbyax -cisapride -linezolid -MAOIs like Carbex, Eldepryl, Marplan, Nardil, and Parnate -methylene blue (injected into a vein) -pimozide -thioridazine This medicine may also interact with the following medications: -alcohol -aspirin and aspirin-like medicines -carbamazepine -certain medicines for depression, anxiety, or psychotic disturbances -certain medicines for migraine headaches like almotriptan, eletriptan, frovatriptan, naratriptan, rizatriptan, sumatriptan, zolmitriptan -digoxin -diuretics -fentanyl -flecainide -furazolidone -isoniazid -lithium -medicines  for sleep -medicines that treat or prevent blood clots like warfarin, enoxaparin, and dalteparin -NSAIDs, medicines for pain and inflammation, like ibuprofen or naproxen -phenytoin -procarbazine -propafenone -rasagiline -ritonavir -supplements like St. John's wort, kava kava, valerian -tramadol -tryptophan -vinblastine This list may not describe all possible interactions. Give your health care provider a list of all the medicines, herbs, non-prescription drugs, or dietary supplements you use. Also tell them if you smoke, drink alcohol, or use illegal drugs. Some items may interact with your medicine. What should I watch for while using this medicine? Tell your doctor if your symptoms do not get better or if they get worse. Visit your doctor or health care professional for regular checks on your progress. Because it may take several weeks to see the full effects of this medicine, it is important to continue your treatment as prescribed by your doctor. Patients and their families should watch out for new or worsening thoughts of suicide or depression. Also watch out for sudden changes in feelings such as feeling anxious, agitated, panicky, irritable, hostile, aggressive, impulsive, severely restless,  overly excited and hyperactive, or not being able to sleep. If this happens, especially at the beginning of treatment or after a change in dose, call your health care professional. Dennis Bast may get drowsy or dizzy. Do not drive, use machinery, or do anything that needs mental alertness until you know how this medicine affects you. Do not stand or sit up quickly, especially if you are an older patient. This reduces the risk of dizzy or fainting spells. Alcohol may interfere with the effect of this medicine. Avoid alcoholic drinks. Your mouth may get dry. Chewing sugarless gum or sucking hard candy, and drinking plenty of water may help. Contact your doctor if the problem does not go away or is severe. This  medicine may affect blood sugar levels. If you have diabetes, check with your doctor or health care professional before you change your diet or the dose of your diabetic medicine. What side effects may I notice from receiving this medicine? Side effects that you should report to your doctor or health care professional as soon as possible: -allergic reactions like skin rash, itching or hives, swelling of the face, lips, or tongue -breathing problems -confusion -fast or irregular heart rate, palpitations -flu-like fever, chills, cough, muscle or joint aches and pains -seizures -suicidal thoughts or other mood changes -tremors -trouble sleeping -unusual bleeding or bruising -unusually tired or weak -vomiting Side effects that usually do not require medical attention (report to your doctor or health care professional if they continue or are bothersome): -blurred vision -change in sex drive or performance -diarrhea -dry mouth -flushing -headache -increased or decreased appetite -nausea -sweating This list may not describe all possible side effects. Call your doctor for medical advice about side effects. You may report side effects to FDA at 1-800-FDA-1088. Where should I keep my medicine? Keep out of the reach of children. Store at room temperature between 15 and 30 degrees C (59 and 86 degrees F). Throw away any unused medicine after the expiration date. NOTE: This sheet is a summary. It may not cover all possible information. If you have questions about this medicine, talk to your doctor, pharmacist, or health care provider.  2014, [employer]. (2013-01-30 12:48:36)   Menopause Menopause is the normal time of life when menstrual periods stop completely. Menopause is complete when you have missed 12 consecutive menstrual periods. It usually occurs between the ages of 76 years and 75 years. Very rarely does a woman develop menopause before the age of 44 years. At menopause,  your ovaries stop producing the female hormones estrogen and progesterone. This can cause undesirable symptoms and also affect your health. Sometimes the symptoms may occur 4 5 years before the menopause begins. There is no relationship between menopause and:  Oral contraceptives.  Number of children you had.  Race.  The age your menstrual periods started (menarche). Heavy smokers and very thin women may develop menopause earlier in life. CAUSES  The ovaries stop producing the female hormones estrogen and progesterone.  Other causes include:  Surgery to remove both ovaries.  The ovaries stop functioning for no known reason.  Tumors of the pituitary gland in the brain.  Medical disease that affects the ovaries and hormone production.  Radiation treatment to the abdomen or pelvis.  Chemotherapy that affects the ovaries. SYMPTOMS   Hot flashes.  Night sweats.  Decrease in sex drive.  Vaginal dryness and thinning of the vagina causing painful intercourse.  Dryness of the skin and developing wrinkles.  Headaches.  Tiredness.  Irritability.  Memory problems.  Weight gain.  Bladder infections.  Hair growth of the face and chest.  Infertility. More serious symptoms include:  Loss of bone (osteoporosis) causing breaks (fractures).  Depression.  Hardening and narrowing of the arteries (atherosclerosis) causing heart attacks and strokes. DIAGNOSIS   When the menstrual periods have stopped for 12 straight months.  Physical exam.  Hormone studies of the blood. TREATMENT  There are many treatment choices and nearly as many questions about them. The decisions to treat or not to treat menopausal changes is an individual choice made with your health care provider. Your health care provider can discuss the treatments with you. Together, you can decide which treatment will work best for you. Your treatment choices may include:   Hormone therapy (estrogen and  progesterone).  Non-hormonal medicines.  Treating the individual symptoms with medicine (for example antidepressants for depression).  Herbal medicines that may help specific symptoms.  Counseling by a psychiatrist or psychologist.  Group therapy.  Lifestyle changes including:  Eating healthy.  Regular exercise.  Limiting caffeine and alcohol.  Stress management and meditation.  No treatment. HOME CARE INSTRUCTIONS   Take the medicine your health care provider gives you as directed.  Get plenty of sleep and rest.  Exercise regularly.  Eat a diet that contains calcium (good for the bones) and soy products (acts like estrogen hormone).  Avoid alcoholic beverages.  Do not smoke.  If you have hot flashes, dress in layers.  Take supplements, calcium, and vitamin D to strengthen bones.  You can use over-the-counter lubricants or moisturizers for vaginal dryness.  Group therapy is sometimes very helpful.  Acupuncture may be helpful in some cases. SEEK MEDICAL CARE IF:   You are not sure you are in menopause.  You are having menopausal symptoms and need advice and treatment.  You are still having menstrual periods after age 41 years.  You have pain with intercourse.  Menopause is complete (no menstrual period for 12 months) and you develop vaginal bleeding.  You need a referral to a specialist (gynecologist, psychiatrist, or psychologist) for treatment. SEEK IMMEDIATE MEDICAL CARE IF:   You have severe depression.  You have excessive vaginal bleeding.  You fell and think you have a broken bone.  You have pain when you urinate.  You develop leg or chest pain.  You have a fast pounding heart beat (palpitations).  You have severe headaches.  You develop vision problems.  You feel a lump in your breast.  You have abdominal pain or severe indigestion. Document Released: 09/29/2003 Document Revised: 03/11/2013 Document Reviewed: 02/05/2013 [facility]  Patient Information 2014 [place], [place].

## 2013-10-19 NOTE — Progress Notes (Signed)
States here for follow up from last visit for menopausal symptoms.  States wants to discuss possibly weaning/stopping it/alternatives . States had one episode of withdrawal when she ran out of the gabapentin- called and got a refill.States was having anxiety/depression/sort of suicidal/ nausea/ lack of appetite/ restless sleep- states was all better when restarted.

## 2013-10-19 NOTE — Progress Notes (Signed)
Subjective:     Patient ID: SHEYENNE LAMBERTUS, female   DOB: [birth year], 53 y.o.   MRN: [id number]  HPI 53 y.o. F here for discussion of menopause symptoms. Pt reports last period was 2012. Greater than 12 months. Pt reports hot flashes persistently 5-6x/day. Pt reports she is taking the gabapentin 2-3x/day. Pt reports she has been irritable, anxiety, forgetfulness, difficulty focusing. Pt reports that when she stops taking the gabapentin - pt stopped for 3-4 days. Pt had signfiicant recurrance of symptoms. Possible withdrawal of gabapentin  Review of Systems No periods, no complaints of discharge. Pt reports that she will have cramping and spotting 3/year.     Objective:   Physical Exam Filed Vitals:   10/19/13 1303  BP: 154/89  Pulse: 93  Temp: 98.1 F (36.7 C)   VSS - mildly elevated BP HEENT: normocephalic, atraumatic Lungs: CTAB no wrc Heart: RRR no no mgt Abd: NTTP, no organomegaly. Uterus stable.  Ext: No c/c/e    Assessment:     YAVETTE GUTSCH is a 53 y.o. F with perimenopausal symptoms of hot flashes improved with neurontin  #perimenopause sx: continue neurontin as previously Rx. Pt may titrate down and discontinue if patient desires. Discussed SSRI or HRT and pt will consider. May attempt to ween prior to return visit.   #HTN: f/u PCM not severe range.   Fredrik Rigger, MD OB Fellow

## 2013-12-22 ENCOUNTER — Ambulatory Visit: Payer: No Typology Code available for payment source | Attending: Internal Medicine

## 2013-12-22 DIAGNOSIS — M2560 Stiffness of unspecified joint, not elsewhere classified: Secondary | ICD-10-CM | POA: Insufficient documentation

## 2013-12-22 DIAGNOSIS — IMO0001 Reserved for inherently not codable concepts without codable children: Secondary | ICD-10-CM | POA: Insufficient documentation

## 2013-12-22 DIAGNOSIS — R293 Abnormal posture: Secondary | ICD-10-CM | POA: Insufficient documentation

## 2013-12-22 DIAGNOSIS — M2550 Pain in unspecified joint: Secondary | ICD-10-CM | POA: Insufficient documentation

## 2014-01-04 ENCOUNTER — Ambulatory Visit: Payer: No Typology Code available for payment source | Admitting: Rehabilitation

## 2014-01-06 ENCOUNTER — Ambulatory Visit: Payer: No Typology Code available for payment source | Admitting: Rehabilitation

## 2014-01-11 ENCOUNTER — Ambulatory Visit: Payer: No Typology Code available for payment source | Admitting: Rehabilitation

## 2014-01-13 ENCOUNTER — Ambulatory Visit: Payer: No Typology Code available for payment source | Admitting: Rehabilitation

## 2014-01-20 ENCOUNTER — Encounter: Payer: No Typology Code available for payment source | Admitting: Rehabilitation

## 2014-01-25 ENCOUNTER — Ambulatory Visit: Payer: No Typology Code available for payment source | Attending: Internal Medicine | Admitting: Rehabilitation

## 2014-01-25 DIAGNOSIS — M2550 Pain in unspecified joint: Secondary | ICD-10-CM | POA: Insufficient documentation

## 2014-01-25 DIAGNOSIS — IMO0001 Reserved for inherently not codable concepts without codable children: Secondary | ICD-10-CM | POA: Insufficient documentation

## 2014-01-25 DIAGNOSIS — R293 Abnormal posture: Secondary | ICD-10-CM | POA: Insufficient documentation

## 2014-01-25 DIAGNOSIS — M2560 Stiffness of unspecified joint, not elsewhere classified: Secondary | ICD-10-CM | POA: Insufficient documentation

## 2014-01-28 ENCOUNTER — Ambulatory Visit: Payer: No Typology Code available for payment source

## 2014-02-03 ENCOUNTER — Ambulatory Visit (INDEPENDENT_AMBULATORY_CARE_PROVIDER_SITE_OTHER): Payer: No Typology Code available for payment source | Admitting: Obstetrics & Gynecology

## 2014-02-03 ENCOUNTER — Encounter: Payer: Self-pay | Admitting: Obstetrics & Gynecology

## 2014-02-03 VITALS — BP 148/88 | HR 99 | Temp 97.1°F | Ht 65.0 in | Wt 190.6 lb

## 2014-02-03 DIAGNOSIS — N951 Menopausal and female climacteric states: Secondary | ICD-10-CM

## 2014-02-03 DIAGNOSIS — Z780 Asymptomatic menopausal state: Secondary | ICD-10-CM

## 2014-02-03 MED ORDER — GABAPENTIN 100 MG PO CAPS: ORAL_CAPSULE | ORAL | Status: DC

## 2014-02-03 NOTE — Progress Notes (Signed)
   CLINIC ENCOUNTER NOTE  History:  53 y.o. [id number] here today for discussion about weaning of Gabapentin that she uses for vasomotor symptoms.  On 300 mg daily, wants lower dose and wants to taper down slowly over 2-3 months. She is interested in homeopathic remedies, declines any further prescribed medications.  The following portions of the patient's history were reviewed and updated as appropriate: allergies, current medications, past family history, past medical history, past social history, past surgical history and problem list.  Last mammogram was in 02/18/2012 and was normal. Normal pap smear and negative HRHPV in 01/07/2012.   Review of Systems:  Pertinent items are noted in HPI.  Objective:  BP 148/88  Pulse 99  Temp(Src) 97.1 F (36.2 C) (Oral)  Ht 5\' 5"  (1.651 m)  Wt 190 lb 9.6 oz (86.456 kg)  BMI 31.72 kg/m2 Physical Exam deferred until next visit  Assessment & Plan:  Neurontin taper prescribed as requested (200 mg daily x 1 month, then 100 mg daily for one month then off) Cautioned about use of homeopathic remedies. Follow up in 3 months; annual exam to be done then.  Verita Schneiders, MD, [place] Attending [place] for [employer], [place]

## 2014-02-03 NOTE — Patient Instructions (Addendum)
Mammogram was in 02/18/2012 and was normal.  Normal pap smear and negative HRHPV in 01/07/2012.    Menopause and Herbal Products Menopause is the normal time of life when menstrual periods stop completely. Menopause is complete when you have missed 12 consecutive menstrual periods. It usually occurs between the ages of 54 to 108, with an average age of 26. Very rarely does a woman develop menopause before 52 years old. At menopause, your ovaries stop producing the female hormones, estrogen and progesterone. This can cause undesirable symptoms and also affect your health. Sometimes the symptoms can occur 4 to 5 years before the menopause begins. There is no relationship between menopause and:  Oral contraceptives.  Number of children you had.  Race.  The age your menstrual periods started (menarche). Heavy smokers and very thin women may develop menopause earlier in life. Estrogen and progesterone hormone treatment is the usual method of treating menopausal symptoms. However, there are women who should not take hormone treatment. This is true of:   Women that have breast or uterine cancer.  Women who prefer not to take hormones because of certain side effects (abnormal uterine bleeding).  Women who are afraid that hormones may cause breast cancer.  Women who have a history of liver disease, heart disease, stroke, or blood clots. For these women, there are other medications that may help treat their menopausal symptoms. These medications are found in plants and botanical products. They can be found in the form of herbs, teas, oils, tinctures, and pills.  CAUSES:  The ovaries stop producing the female hormones estrogen and progesterone.  Other causes include:  Surgery to remove both ovaries.  The ovaries stop functioning for no know reason.  Tumors of the pituitary gland in the brain.  Medical disease that affects the ovaries and hormone production.  Radiation treatment to the  abdomen or pelvis.  Chemotherapy that affects the ovaries. PHYTOESTROGENS: Phytoestrogens occur naturally in plants and plant products. They act like estrogen in the body. Herbal medications are made from these plants and botanical steroids. There are 3 types of phytoestrogens:  Isoflavones (genistein and daidzein) are found in soy, garbanzo beans, miso and tofu foods.  Ligins are found in the shell of seeds. They are used to make oils like flaxseed oil. The bacteria in your intestine act on these foods to produce the estrogen-like hormones.  Coumestans are estrogen-like. Some of the foods they are found in include sunflower seeds and bean sprouts. CONDITIONS AND THEIR POSSIBLE HERBAL TREATMENT:  Hot flashes and night sweats.  Soy, black cohosh and evening primrose.  Irritability, insomnia, depression and memory problems.  Chasteberry, ginseng, and soy.  St. John's wort may be helpful for depression. However, there is a concern of it causing cataracts of the eye and may have bad effects on other medications. St. John's wort should not be taken for long time and without your caregiver's advice.  Loss of libido and vaginal and skin dryness.  Wild yam and soy.  Prevention of coronary heart disease and osteoporosis.  Soy and Isoflavones. Several studies have shown that some women benefit from herbal medications, but most of the studies have not consistently shown that these supplements are much better than placebo. Other forms of treatment to help women with menopausal symptoms include a balanced diet, rest, exercise, vitamin and calcium (with vitamin D) supplements, acupuncture, and group therapy when necessary. THOSE WHO SHOULD NOT TAKE HERBAL MEDICATIONS INCLUDE:  Women who are planning on getting pregnant unless told  by your caregiver.  Women who are breastfeeding unless told by your caregiver.  Women who are taking other prescription medications unless told by your  caregiver.  Infants, children, and elderly women unless told by your caregiver. Different herbal medications have different and unmeasured amounts of the herbal ingredients. There are no regulations, quality control, and standardization of the ingredients in herbal medications. Therefore, the amount of the ingredient in the medication may vary from one herb, pill, tea, oil or tincture to another. Many herbal medications can cause serious problems and can even have poisonous effects if taken too much or too long. If problems develop, the medication should be stopped and recorded by your caregiver. HOME CARE INSTRUCTIONS  Do not take or give children herbal medications without your caregiver's advice.  Let your caregiver know all the medications you are taking. This includes prescription, over-the-counter, eye drops, and creams.  Do not take herbal medications longer or more than recommended.  Tell your caregiver about any side effects from the medication. SEEK MEDICAL CARE IF:  You develop a fever of 102 F (38.9 C), or as directed by your caregiver.  You feel sick to your stomach (nauseous), vomit, or have diarrhea.  You develop a rash.  You develop abdominal pain.  You develop severe headaches.  You start to have vision problems.  You feel dizzy or faint.  You start to feel numbness in any part of your body.  You start shaking (have convulsions). Document Released: 12/26/2007 Document Revised: 06/25/2012 Document Reviewed: 07/25/2010 [facility] Patient Information 2015 [place], [place]. This information is not intended to replace advice given to you by your health care provider. Make sure you discuss any questions you have with your health care provider.  Menopause Menopause is the normal time of life when menstrual periods stop completely. Menopause is complete when you have missed 12 consecutive menstrual periods. It usually occurs between the ages of 48 years and 61 years. Very  rarely does a woman develop menopause before the age of 62 years. At menopause, your ovaries stop producing the female hormones estrogen and progesterone. This can cause undesirable symptoms and also affect your health. Sometimes the symptoms may occur 4-5 years before the menopause begins. There is no relationship between menopause and:  Oral contraceptives.  Number of children you had.  Race.  The age your menstrual periods started (menarche). Heavy smokers and very thin women may develop menopause earlier in life. CAUSES  The ovaries stop producing the female hormones estrogen and progesterone.  Other causes include:  Surgery to remove both ovaries.  The ovaries stop functioning for no known reason.  Tumors of the pituitary gland in the brain.  Medical disease that affects the ovaries and hormone production.  Radiation treatment to the abdomen or pelvis.  Chemotherapy that affects the ovaries. SYMPTOMS   Hot flashes.  Night sweats.  Decrease in sex drive.  Vaginal dryness and thinning of the vagina causing painful intercourse.  Dryness of the skin and developing wrinkles.  Headaches.  Tiredness.  Irritability.  Memory problems.  Weight gain.  Bladder infections.  Hair growth of the face and chest.  Infertility. More serious symptoms include:  Loss of bone (osteoporosis) causing breaks (fractures).  Depression.  Hardening and narrowing of the arteries (atherosclerosis) causing heart attacks and strokes. DIAGNOSIS   When the menstrual periods have stopped for 12 straight months.  Physical exam.  Hormone studies of the blood. TREATMENT  There are many treatment choices and nearly as many  questions about them. The decisions to treat or not to treat menopausal changes is an individual choice made with your health care provider. Your health care provider can discuss the treatments with you. Together, you can decide which treatment will work best for  you. Your treatment choices may include:   Hormone therapy (estrogen and progesterone).  Non-hormonal medicines.  Treating the individual symptoms with medicine (for example antidepressants for depression).  Herbal medicines that may help specific symptoms.  Counseling by a psychiatrist or psychologist.  Group therapy.  Lifestyle changes including:  Eating healthy.  Regular exercise.  Limiting caffeine and alcohol.  Stress management and meditation.  No treatment. HOME CARE INSTRUCTIONS   Take the medicine your health care provider gives you as directed.  Get plenty of sleep and rest.  Exercise regularly.  Eat a diet that contains calcium (good for the bones) and soy products (acts like estrogen hormone).  Avoid alcoholic beverages.  Do not smoke.  If you have hot flashes, dress in layers.  Take supplements, calcium, and vitamin D to strengthen bones.  You can use over-the-counter lubricants or moisturizers for vaginal dryness.  Group therapy is sometimes very helpful.  Acupuncture may be helpful in some cases. SEEK MEDICAL CARE IF:   You are not sure you are in menopause.  You are having menopausal symptoms and need advice and treatment.  You are still having menstrual periods after age 42 years.  You have pain with intercourse.  Menopause is complete (no menstrual period for 12 months) and you develop vaginal bleeding.  You need a referral to a specialist (gynecologist, psychiatrist, or psychologist) for treatment. SEEK IMMEDIATE MEDICAL CARE IF:   You have severe depression.  You have excessive vaginal bleeding.  You fell and think you have a broken bone.  You have pain when you urinate.  You develop leg or chest pain.  You have a fast pounding heart beat (palpitations).  You have severe headaches.  You develop vision problems.  You feel a lump in your breast.  You have abdominal pain or severe indigestion. Document Released:  09/29/2003 Document Revised: 03/11/2013 Document Reviewed: 02/05/2013 [facility] Patient Information 2015 [place], [place]. This information is not intended to replace advice given to you by your health care provider. Make sure you discuss any questions you have with your health care provider.

## 2014-02-09 ENCOUNTER — Ambulatory Visit: Payer: No Typology Code available for payment source | Admitting: Physical Therapy

## 2014-02-11 ENCOUNTER — Ambulatory Visit: Payer: No Typology Code available for payment source | Admitting: Physical Therapy

## 2014-02-15 ENCOUNTER — Ambulatory Visit: Payer: No Typology Code available for payment source

## 2014-02-17 ENCOUNTER — Ambulatory Visit: Payer: No Typology Code available for payment source

## 2014-02-22 ENCOUNTER — Ambulatory Visit: Payer: No Typology Code available for payment source | Attending: Internal Medicine | Admitting: Physical Therapy

## 2014-02-22 DIAGNOSIS — M2560 Stiffness of unspecified joint, not elsewhere classified: Secondary | ICD-10-CM | POA: Insufficient documentation

## 2014-02-22 DIAGNOSIS — M2550 Pain in unspecified joint: Secondary | ICD-10-CM | POA: Insufficient documentation

## 2014-02-22 DIAGNOSIS — IMO0001 Reserved for inherently not codable concepts without codable children: Secondary | ICD-10-CM | POA: Insufficient documentation

## 2014-02-22 DIAGNOSIS — R293 Abnormal posture: Secondary | ICD-10-CM | POA: Insufficient documentation

## 2014-02-24 ENCOUNTER — Other Ambulatory Visit: Payer: Self-pay | Admitting: Obstetrics & Gynecology

## 2014-02-24 ENCOUNTER — Ambulatory Visit: Payer: No Typology Code available for payment source | Admitting: Rehabilitation

## 2014-02-24 DIAGNOSIS — Z1231 Encounter for screening mammogram for malignant neoplasm of breast: Secondary | ICD-10-CM

## 2014-03-03 ENCOUNTER — Ambulatory Visit: Payer: No Typology Code available for payment source | Admitting: Rehabilitation

## 2014-03-08 ENCOUNTER — Ambulatory Visit: Payer: No Typology Code available for payment source | Admitting: Physical Therapy

## 2014-03-11 ENCOUNTER — Ambulatory Visit (HOSPITAL_COMMUNITY)
Admission: RE | Admit: 2014-03-11 | Discharge: 2014-03-11 | Disposition: A | Discharge status: Home / Self Care | Payer: No Typology Code available for payment source | Source: Ambulatory Visit | Attending: Obstetrics & Gynecology | Admitting: Obstetrics & Gynecology

## 2014-03-11 ENCOUNTER — Ambulatory Visit: Payer: No Typology Code available for payment source | Admitting: Physical Therapy

## 2014-03-11 DIAGNOSIS — Z1231 Encounter for screening mammogram for malignant neoplasm of breast: Secondary | ICD-10-CM

## 2014-03-16 ENCOUNTER — Ambulatory Visit: Payer: No Typology Code available for payment source | Admitting: Physical Therapy

## 2014-03-18 ENCOUNTER — Ambulatory Visit: Payer: No Typology Code available for payment source | Admitting: Physical Therapy

## 2014-03-27 ENCOUNTER — Encounter (HOSPITAL_COMMUNITY): Payer: Self-pay | Admitting: Emergency Medicine

## 2014-03-27 ENCOUNTER — Emergency Department (HOSPITAL_COMMUNITY)
Admission: EM | Admit: 2014-03-27 | Discharge: 2014-03-27 | Disposition: A | Discharge status: Home / Self Care | Payer: No Typology Code available for payment source | Attending: Emergency Medicine | Admitting: Emergency Medicine

## 2014-03-27 DIAGNOSIS — J3489 Other specified disorders of nose and nasal sinuses: Secondary | ICD-10-CM | POA: Insufficient documentation

## 2014-03-27 DIAGNOSIS — I10 Essential (primary) hypertension: Secondary | ICD-10-CM | POA: Insufficient documentation

## 2014-03-27 DIAGNOSIS — E119 Type 2 diabetes mellitus without complications: Secondary | ICD-10-CM | POA: Insufficient documentation

## 2014-03-27 DIAGNOSIS — Z862 Personal history of diseases of the blood and blood-forming organs and certain disorders involving the immune mechanism: Secondary | ICD-10-CM | POA: Insufficient documentation

## 2014-03-27 DIAGNOSIS — B182 Chronic viral hepatitis C: Secondary | ICD-10-CM | POA: Insufficient documentation

## 2014-03-27 DIAGNOSIS — Z79899 Other long term (current) drug therapy: Secondary | ICD-10-CM | POA: Insufficient documentation

## 2014-03-27 MED ORDER — KETOROLAC TROMETHAMINE 60 MG/2ML IM SOLN
60.0000 mg | Freq: Once | INTRAMUSCULAR | Status: AC
  Administered 2014-03-27: 60 mg via INTRAMUSCULAR
  Filled 2014-03-27: qty 2

## 2014-03-27 MED ORDER — NAPROXEN 500 MG PO TABS: 500.0000 mg | ORAL_TABLET | Freq: Two times a day (BID) | ORAL | Status: DC

## 2014-03-27 MED ORDER — OXYCODONE-ACETAMINOPHEN 5-325 MG PO TABS
1.0000 | ORAL_TABLET | Freq: Once | ORAL | Status: AC
  Administered 2014-03-27: 1 via ORAL
  Filled 2014-03-27: qty 1

## 2014-03-27 MED ORDER — HYDROCODONE-ACETAMINOPHEN 5-325 MG PO TABS: 1.0000 | ORAL_TABLET | Freq: Four times a day (QID) | ORAL | Status: DC | PRN

## 2014-03-27 NOTE — ED Notes (Signed)
MD Campos at bedside.  

## 2014-03-27 NOTE — ED Provider Notes (Signed)
CSN: [id number]     Arrival date & time 03/27/14  0906 History   First MD Initiated Contact with Patient 03/27/14 [phone]     Chief Complaint  Patient presents with  . sinus pressure   . Nasal Congestion      The history is provided by the patient.   patient reports sinus pressure the past 2 weeks.  She started on amoxicillin by her doctor approximately 3 days ago.  She states no resolution of her symptoms.  She presents with ongoing sinus pressure and headache.  She denies weakness of her arms or legs.  She tried over-the-counter medications for headache without improvement in her symptoms.  No fevers or chills.  No dental pain.  No difficulty swallowing or breathing.  No change in her vision.    Past Medical History  Diagnosis Date  . Diabetes mellitus   . Hypertension   . Anemia   . Hep C w/o coma, chronic    Past Surgical History  Procedure Laterality Date  . Tubal ligation     Family History  Problem Relation Age of Onset  . Hypertension Mother   . Cancer Mother   . Diabetes Father   . Hypertension Father    History  Substance Use Topics  . Smoking status: Never Smoker   . Smokeless tobacco: Never Used  . Alcohol Use: No   OB History   Grav Para Term Preterm Abortions TAB SAB Ect Mult Living   3 3 3       3      Review of Systems  All other systems reviewed and are negative.     Allergies  Iodine; Peanut-containing drug products; and Shellfish allergy  Home Medications   Prior to Admission medications   Medication Sig Start Date End Date Taking? Authorizing Provider  amLODipine (NORVASC) 10 MG tablet Take 10 mg by mouth daily.      Historical Provider, MD  gabapentin (NEURONTIN) 100 MG capsule Take 1 capsule by mouth two times daily for one month, then one capsule daily for one month, then discontinue 02/03/14   Osborne Oman, MD  HYDROcodone-acetaminophen (NORCO/VICODIN) 5-325 MG per tablet Take 1 tablet by mouth every 6 (six) hours as needed for moderate  pain. 03/27/14   Hoy Morn, MD  Insulin Glargine (LANTUS SOLOSTAR [place]) Inject 31 Units into the skin daily.     Historical Provider, MD  naproxen (NAPROSYN) 500 MG tablet Take 1 tablet (500 mg total) by mouth 2 (two) times daily with a meal. 03/27/14   Hoy Morn, MD  valsartan-hydrochlorothiazide (DIOVAN-HCT) 320-25 MG per tablet Take 1 tablet by mouth daily.      Historical Provider, MD   BP 145/109  Pulse 85  Temp(Src) 97.5 F (36.4 C) (Oral)  Resp 17  SpO2 100% Physical Exam  Nursing note and vitals reviewed. Constitutional: She is oriented to person, place, and time. She appears well-developed and well-nourished. No distress.  HENT:  Head: Normocephalic and atraumatic.  Posterior pharynx is normal. Uvula midnight.  The tonsillar swelling or exudate.  Dentition without significant abnormality  Eyes: EOM are normal.  Neck: Normal range of motion.  Cardiovascular: Normal rate, regular rhythm and normal heart sounds.   Pulmonary/Chest: Effort normal and breath sounds normal.  Abdominal: Soft. She exhibits no distension. There is no tenderness.  Musculoskeletal: Normal range of motion.  Neurological: She is alert and oriented to person, place, and time.  Skin: Skin is warm and dry.  Psychiatric:  She has a normal mood and affect. Judgment normal.    ED Course  Procedures (including critical care time) Labs Review Labs Reviewed - No data to display  Imaging Review No results found.   EKG Interpretation None      MDM   Final diagnoses:  Sinus pressure    May represent sinusitis.  Patient is on antibiotics.  Am not convinced that she's had enough time to say this is treatment failure.  I am not convinced at this time that she is having a secondary complication from sinus disease such as intracranial abscess.  I do not think she needs imaging at this time.  I have asked that she follow up closely with her primary care physician or return to the ER for new or worsening  symptoms.    Hoy Morn, MD 03/27/14 [phone]

## 2014-03-27 NOTE — ED Notes (Addendum)
Pt reports a headache x 2 weeks with what she describes as "nasal passage congestion". Her PCP told her to use nasal spray to help with congestion and she has been taking imitrex and tylenol without relief. She denies cough.

## 2014-03-27 NOTE — ED Notes (Signed)
Per pt, states sinus issues for 2 weeks-pressure and headache-saw PCP and received injection which has not helped

## 2014-03-27 NOTE — Discharge Instructions (Signed)

## 2014-04-14 ENCOUNTER — Telehealth: Payer: Self-pay | Admitting: General Practice

## 2014-04-14 NOTE — Telephone Encounter (Signed)
Patient called and left message stating she would like a refill on her gabapentin 100mg . She was told by Dr Harolyn Rutherford that she can stop her medication on the 17th but since stopping she has had some problems and would like a refill on the medication to last until her November appt with [place]. Called patient, no answer- left message that I am trying to return your phone call, please call us back at the clinics

## 2014-04-15 NOTE — Telephone Encounter (Signed)
Patient returned call. Attempted to call patient back at number left [phone]). No answer. Left message stating we are returning your call, please call clinic.

## 2014-04-19 NOTE — Telephone Encounter (Signed)
Called patient, no answer- left message stating I am trying to return your phone call, please call us back at the clinics if you still need assistance.

## 2014-05-24 ENCOUNTER — Encounter (HOSPITAL_COMMUNITY): Payer: Self-pay | Admitting: Emergency Medicine

## 2014-05-26 ENCOUNTER — Ambulatory Visit (INDEPENDENT_AMBULATORY_CARE_PROVIDER_SITE_OTHER): Payer: Self-pay | Admitting: Obstetrics & Gynecology

## 2014-05-26 ENCOUNTER — Encounter: Payer: Self-pay | Admitting: Obstetrics & Gynecology

## 2014-05-26 VITALS — BP 141/88 | HR 79 | Temp 98.3°F | Resp 20 | Ht 63.0 in | Wt 190.6 lb

## 2014-05-26 DIAGNOSIS — Z1151 Encounter for screening for human papillomavirus (HPV): Secondary | ICD-10-CM

## 2014-05-26 DIAGNOSIS — N959 Unspecified menopausal and perimenopausal disorder: Secondary | ICD-10-CM

## 2014-05-26 DIAGNOSIS — Z01419 Encounter for gynecological examination (general) (routine) without abnormal findings: Secondary | ICD-10-CM

## 2014-05-26 DIAGNOSIS — Z124 Encounter for screening for malignant neoplasm of cervix: Secondary | ICD-10-CM

## 2014-05-26 DIAGNOSIS — N951 Menopausal and female climacteric states: Secondary | ICD-10-CM

## 2014-05-26 DIAGNOSIS — Z780 Asymptomatic menopausal state: Secondary | ICD-10-CM

## 2014-05-26 MED ORDER — GABAPENTIN 100 MG PO CAPS: ORAL_CAPSULE | ORAL | Status: DC

## 2014-05-26 NOTE — Patient Instructions (Signed)
Menopause Menopause is the normal time of life when menstrual periods stop completely. Menopause is complete when you have missed 12 consecutive menstrual periods. It usually occurs between the ages of 48 years and 55 years. Very rarely does a woman develop menopause before the age of 40 years. At menopause, your ovaries stop producing the female hormones estrogen and progesterone. This can cause undesirable symptoms and also affect your health. Sometimes the symptoms may occur 4-5 years before the menopause begins. There is no relationship between menopause and:  Oral contraceptives.  Number of children you had.  Race.  The age your menstrual periods started (menarche). Heavy smokers and very thin women may develop menopause earlier in life. CAUSES  The ovaries stop producing the female hormones estrogen and progesterone.  Other causes include:  Surgery to remove both ovaries.  The ovaries stop functioning for no known reason.  Tumors of the pituitary gland in the brain.  Medical disease that affects the ovaries and hormone production.  Radiation treatment to the abdomen or pelvis.  Chemotherapy that affects the ovaries. SYMPTOMS   Hot flashes.  Night sweats.  Decrease in sex drive.  Vaginal dryness and thinning of the vagina causing painful intercourse.  Dryness of the skin and developing wrinkles.  Headaches.  Tiredness.  Irritability.  Memory problems.  Weight gain.  Bladder infections.  Hair growth of the face and chest.  Infertility. More serious symptoms include:  Loss of bone (osteoporosis) causing breaks (fractures).  Depression.  Hardening and narrowing of the arteries (atherosclerosis) causing heart attacks and strokes. DIAGNOSIS   When the menstrual periods have stopped for 12 straight months.  Physical exam.  Hormone studies of the blood. TREATMENT  There are many treatment choices and nearly as many questions about them. The  decisions to treat or not to treat menopausal changes is an individual choice made with your health care provider. Your health care provider can discuss the treatments with you. Together, you can decide which treatment will work best for you. Your treatment choices may include:   Hormone therapy (estrogen and progesterone).  Non-hormonal medicines.  Treating the individual symptoms with medicine (for example antidepressants for depression).  Herbal medicines that may help specific symptoms.  Counseling by a psychiatrist or psychologist.  Group therapy.  Lifestyle changes including:  Eating healthy.  Regular exercise.  Limiting caffeine and alcohol.  Stress management and meditation.  No treatment. HOME CARE INSTRUCTIONS   Take the medicine your health care provider gives you as directed.  Get plenty of sleep and rest.  Exercise regularly.  Eat a diet that contains calcium (good for the bones) and soy products (acts like estrogen hormone).  Avoid alcoholic beverages.  Do not smoke.  If you have hot flashes, dress in layers.  Take supplements, calcium, and vitamin D to strengthen bones.  You can use over-the-counter lubricants or moisturizers for vaginal dryness.  Group therapy is sometimes very helpful.  Acupuncture may be helpful in some cases. SEEK MEDICAL CARE IF:   You are not sure you are in menopause.  You are having menopausal symptoms and need advice and treatment.  You are still having menstrual periods after age 55 years.  You have pain with intercourse.  Menopause is complete (no menstrual period for 12 months) and you develop vaginal bleeding.  You need a referral to a specialist (gynecologist, psychiatrist, or psychologist) for treatment. SEEK IMMEDIATE MEDICAL CARE IF:   You have severe depression.  You have excessive vaginal bleeding.    You fell and think you have a broken bone.  You have pain when you urinate.  You develop leg or  chest pain.  You have a fast pounding heart beat (palpitations).  You have severe headaches.  You develop vision problems.  You feel a lump in your breast.  You have abdominal pain or severe indigestion. Document Released: 09/29/2003 Document Revised: 03/11/2013 Document Reviewed: 02/05/2013 [employer] Patient Information 2015 [employer]. This information is not intended to replace advice given to you by your health care provider. Make sure you discuss any questions you have with your health care provider.  

## 2014-05-26 NOTE — Progress Notes (Signed)
Subjective:     Patient ID: Maureen Barnes, female   DOB: 09-06-1960, 53 y.o.   MRN: [id number]  HPI Pt presents to discuss weaning Gabapentin and to get her annual GYN exam.  She had a mammogram in 03/11/2014.  She reports that she was weaning Gabapetin but, began to have sx and her primary care physician said she was having a side effects from the wean.  She was previously on 300mg  tid and now is on 100mg  daily.       Past Medical History  Diagnosis Date  . Diabetes mellitus   . Hypertension   . Anemia   . Hep C w/o coma, chronic    Past Surgical History  Procedure Laterality Date  . Tubal ligation     Current Outpatient Prescriptions on File Prior to Visit  Medication Sig Dispense Refill  . amLODipine (NORVASC) 10 MG tablet Take 10 mg by mouth daily.      . Insulin Glargine (LANTUS SOLOSTAR [place]) Inject 31 Units into the skin daily.     Marland Kitchen HYDROcodone-acetaminophen (NORCO/VICODIN) 5-325 MG per tablet Take 1 tablet by mouth every 6 (six) hours as needed for moderate pain. 10 tablet 0  . naproxen (NAPROSYN) 500 MG tablet Take 1 tablet (500 mg total) by mouth 2 (two) times daily with a meal. 10 tablet 0  . valsartan-hydrochlorothiazide (DIOVAN-HCT) 320-25 MG per tablet Take 1 tablet by mouth daily.      . [DISCONTINUED] loratadine (CLARITIN) 10 MG tablet Take 10 mg by mouth daily.       No current facility-administered medications on file prior to visit.   Allergies  Allergen Reactions  . Iodine Itching and Swelling    Shellfish-lips swell itching.  . Peanut-Containing Drug Products Itching and Swelling  . Shellfish Allergy Itching and Swelling       Review of Systems     Objective:   Physical Exam BP 141/88 mmHg  Pulse 79  Temp(Src) 98.3 F (36.8 C) (Oral)  Resp 20  Ht 5\' 3"  (1.6 m)  Wt 190 lb 9.6 oz (86.456 kg)  BMI 33.77 kg/m2  LMP 06/26/2011 Lungs: CTA CV: RRR Abd; obese, NT, ND.  Well healed vertical incision GU: EGBUS: no lesions Vagina: no blood in  vault Cervix: no lesion; no mucopurulent d/c Uterus: small, mobile Adnexa: no masses; non tender          Assessment:     Routine GYN exam     Plan:     Pt currently weaning from Gabapentin.  Now at 100mg  daily.  Will try to wean to 100mg  every other day    F/u PAP   Pt given info on Paxil- will read and consider that as an option for treatment

## 2014-05-26 NOTE — Progress Notes (Signed)
Pt states that her LMP was in 2012.  She also states she is still taking Gabapentin and the Rx was renewed by her [facility] physician because of withdrawal symptoms.

## 2014-05-28 LAB — CYTOLOGY - PAP

## 2014-11-02 ENCOUNTER — Other Ambulatory Visit: Payer: Self-pay | Admitting: Nephrology

## 2014-11-02 DIAGNOSIS — N183 Chronic kidney disease, stage 3 (moderate): Secondary | ICD-10-CM

## 2014-11-08 ENCOUNTER — Ambulatory Visit
Admission: RE | Admit: 2014-11-08 | Discharge: 2014-11-08 | Disposition: A | Discharge status: Home / Self Care | Payer: No Typology Code available for payment source | Source: Ambulatory Visit | Attending: Nephrology | Admitting: Nephrology

## 2014-11-08 DIAGNOSIS — N183 Chronic kidney disease, stage 3 (moderate): Secondary | ICD-10-CM

## 2014-11-16 ENCOUNTER — Ambulatory Visit
Admission: RE | Admit: 2014-11-16 | Discharge: 2014-11-16 | Disposition: A | Discharge status: Home / Self Care | Payer: No Typology Code available for payment source | Source: Ambulatory Visit | Attending: Internal Medicine | Admitting: Internal Medicine

## 2014-11-16 ENCOUNTER — Other Ambulatory Visit: Payer: Self-pay | Admitting: Internal Medicine

## 2014-11-16 DIAGNOSIS — M542 Cervicalgia: Secondary | ICD-10-CM

## 2014-11-23 ENCOUNTER — Encounter (HOSPITAL_COMMUNITY): Payer: Self-pay | Admitting: Emergency Medicine

## 2014-11-23 ENCOUNTER — Emergency Department (HOSPITAL_COMMUNITY)
Admission: EM | Admit: 2014-11-23 | Discharge: 2014-11-23 | Disposition: A | Discharge status: Home / Self Care | Payer: No Typology Code available for payment source | Attending: Emergency Medicine | Admitting: Emergency Medicine

## 2014-11-23 DIAGNOSIS — Z794 Long term (current) use of insulin: Secondary | ICD-10-CM | POA: Insufficient documentation

## 2014-11-23 DIAGNOSIS — S46812A Strain of other muscles, fascia and tendons at shoulder and upper arm level, left arm, initial encounter: Secondary | ICD-10-CM

## 2014-11-23 DIAGNOSIS — Z8619 Personal history of other infectious and parasitic diseases: Secondary | ICD-10-CM | POA: Insufficient documentation

## 2014-11-23 DIAGNOSIS — Y92410 Unspecified street and highway as the place of occurrence of the external cause: Secondary | ICD-10-CM | POA: Diagnosis not present

## 2014-11-23 DIAGNOSIS — I10 Essential (primary) hypertension: Secondary | ICD-10-CM | POA: Insufficient documentation

## 2014-11-23 DIAGNOSIS — Z79899 Other long term (current) drug therapy: Secondary | ICD-10-CM | POA: Insufficient documentation

## 2014-11-23 DIAGNOSIS — Z862 Personal history of diseases of the blood and blood-forming organs and certain disorders involving the immune mechanism: Secondary | ICD-10-CM | POA: Insufficient documentation

## 2014-11-23 DIAGNOSIS — Y998 Other external cause status: Secondary | ICD-10-CM | POA: Insufficient documentation

## 2014-11-23 DIAGNOSIS — S4992XA Unspecified injury of left shoulder and upper arm, initial encounter: Secondary | ICD-10-CM | POA: Diagnosis present

## 2014-11-23 DIAGNOSIS — Y9389 Activity, other specified: Secondary | ICD-10-CM | POA: Insufficient documentation

## 2014-11-23 DIAGNOSIS — S199XXA Unspecified injury of neck, initial encounter: Secondary | ICD-10-CM | POA: Insufficient documentation

## 2014-11-23 DIAGNOSIS — E119 Type 2 diabetes mellitus without complications: Secondary | ICD-10-CM | POA: Diagnosis not present

## 2014-11-23 DIAGNOSIS — M25512 Pain in left shoulder: Secondary | ICD-10-CM

## 2014-11-23 DIAGNOSIS — S46912A Strain of unspecified muscle, fascia and tendon at shoulder and upper arm level, left arm, initial encounter: Secondary | ICD-10-CM | POA: Diagnosis not present

## 2014-11-23 MED ORDER — CYCLOBENZAPRINE HCL 10 MG PO TABS: 10.0000 mg | ORAL_TABLET | Freq: Two times a day (BID) | ORAL | Status: DC | PRN

## 2014-11-23 MED ORDER — NAPROXEN 500 MG PO TABS: 500.0000 mg | ORAL_TABLET | Freq: Two times a day (BID) | ORAL | Status: DC

## 2014-11-23 NOTE — Discharge Instructions (Signed)
Naproxen as prescribed for pain. Flexeril for muscle spasms. Ice and heat to the left shoulder and trapezius muscle. Make sure to stretch her shoulder to avoid worsening spasms and stiffness. Follow-up with your primary care doctor for recheck in one week if symptoms continue.  Shoulder Sprain A shoulder sprain is the result of damage to the tough, fiber-like tissues (ligaments) that help hold your shoulder in place. The ligaments may be stretched or torn. Besides the main shoulder joint (the ball and socket), there are several smaller joints that connect the bones in this area. A sprain usually involves one of those joints. Most often it is the acromioclavicular (or AC) joint. That is the joint that connects the collarbone (clavicle) and the shoulder blade (scapula) at the top point of the shoulder blade (acromion). A shoulder sprain is a mild form of what is called a shoulder separation. Recovering from a shoulder sprain may take some time. For some, pain lingers for several months. Most people recover without long term problems. CAUSES   A shoulder sprain is usually caused by some kind of trauma. This might be:  Falling on an outstretched arm.  Being hit hard on the shoulder.  Twisting the arm.  Shoulder sprains are more likely to occur in people who:  Play sports.  Have balance or coordination problems. SYMPTOMS   Pain when you move your shoulder.  Limited ability to move the shoulder.  Swelling and tenderness on top of the shoulder.  Redness or warmth in the shoulder.  Bruising.  A change in the shape of the shoulder. DIAGNOSIS  Your healthcare provider may:  Ask about your symptoms.  Ask about recent activity that might have caused those symptoms.  Examine your shoulder. You may be asked to do simple exercises to test movement. The other shoulder will be examined for comparison.  Order some tests that provide a look inside the body. They can show the extent of the  injury. The tests could include:  X-rays.  CT (computed tomography) scan.  MRI (magnetic resonance imaging) scan. RISKS AND COMPLICATIONS  Loss of full shoulder motion.  Ongoing shoulder pain. TREATMENT  How long it takes to recover from a shoulder sprain depends on how severe it was. Treatment options may include:  Rest. You should not use the arm or shoulder until it heals.  Ice. For 2 or 3 days after the injury, put an ice pack on the shoulder up to 4 times a day. It should stay on for 15 to 20 minutes each time. Wrap the ice in a towel so it does not touch your skin.  Over-the-counter medicine to relieve pain.  A sling or brace. This will keep the arm still while the shoulder is healing.  Physical therapy or rehabilitation exercises. These will help you regain strength and motion. Ask your healthcare provider when it is OK to begin these exercises.  Surgery. The need for surgery is rare with a sprained shoulder, but some people may need surgery to keep the joint in place and reduce pain. HOME CARE INSTRUCTIONS   Ask your healthcare provider about what you should and should not do while your shoulder heals.  Make sure you know how to apply ice to the correct area of your shoulder.  Talk with your healthcare provider about which medications should be used for pain and swelling.  If rehabilitation therapy will be needed, ask your healthcare provider to refer you to a therapist. If it is not recommended, then ask  about at-home exercises. Find out when exercise should begin. SEEK MEDICAL CARE IF:  Your pain, swelling, or redness at the joint increases. SEEK IMMEDIATE MEDICAL CARE IF:   You have a fever.  You cannot move your arm or shoulder. Document Released: 11/25/2008 Document Revised: 10/01/2011 Document Reviewed: 11/25/2008 [facility] Patient Information 2015 [place], [place]. This information is not intended to replace advice given to you by your health care provider.  Make sure you discuss any questions you have with your health care provider.
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
## 2014-12-14 ENCOUNTER — Ambulatory Visit: Payer: No Typology Code available for payment source | Attending: Internal Medicine | Admitting: Physical Therapy

## 2014-12-14 DIAGNOSIS — M5090 Cervical disc disorder, unspecified, unspecified cervical region: Secondary | ICD-10-CM | POA: Insufficient documentation

## 2014-12-14 DIAGNOSIS — M6281 Muscle weakness (generalized): Secondary | ICD-10-CM | POA: Insufficient documentation

## 2014-12-14 DIAGNOSIS — M436 Torticollis: Secondary | ICD-10-CM

## 2014-12-14 NOTE — Patient Instructions (Signed)
Extensors, Supine   Lie supine, head on small, rolled towel. Gently tuck chin and bring toward chest. Hold _5__ seconds. Repeat _5__ times per session. Do __5_ sessions per day.  Copyright  VHI. All rights reserved.  Flexibility: Neck Retraction   Pull head straight back, keeping eyes and jaw level. Repeat __5__ times per set. Do ___1_ sets per session. Do _5___ sessions per day.  http://orth.exer.us/344   Copyright  VHI. All rights reserved.
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
## 2014-12-25 ENCOUNTER — Encounter (HOSPITAL_COMMUNITY): Payer: Self-pay | Admitting: *Deleted

## 2014-12-25 ENCOUNTER — Emergency Department (INDEPENDENT_AMBULATORY_CARE_PROVIDER_SITE_OTHER)
Admission: EM | Admit: 2014-12-25 | Discharge: 2014-12-25 | Disposition: A | Discharge status: Home / Self Care | Payer: No Typology Code available for payment source | Source: Home / Self Care | Attending: Emergency Medicine | Admitting: Emergency Medicine

## 2014-12-25 DIAGNOSIS — S46811A Strain of other muscles, fascia and tendons at shoulder and upper arm level, right arm, initial encounter: Secondary | ICD-10-CM

## 2014-12-25 DIAGNOSIS — S29011A Strain of muscle and tendon of front wall of thorax, initial encounter: Secondary | ICD-10-CM

## 2014-12-25 MED ORDER — TRAMADOL HCL 50 MG PO TABS: 50.0000 mg | ORAL_TABLET | Freq: Four times a day (QID) | ORAL | Status: DC | PRN

## 2014-12-25 MED ORDER — PREDNISONE 20 MG PO TABS: 40.0000 mg | ORAL_TABLET | Freq: Every day | ORAL | Status: DC

## 2014-12-25 NOTE — ED Provider Notes (Signed)
CSN: [id number]     Arrival date & time 12/25/14  [id number] History   First MD Initiated Contact with Patient 12/25/14 0932     Chief Complaint  Patient presents with  . Chest Pain   (Consider location/radiation/quality/duration/timing/severity/associated sxs/prior Treatment) HPI  She is a 54 year old woman here for evaluation of chest pain. Her chest pain started Thursday morning. It is located in the right chest and right shoulder blade. It goes into her shoulder. She denies any pain radiating down her arms. No numbness, tingling, weakness of the upper extremities. She denies any associated shortness of breath, dizziness, diaphoresis. The pain is constant. She also reports feeling stiff. This started after seeing her chiropractor on Wednesday and having to lift a heavy weight. She has tried a Flexeril, Tylenol, and heat without improvement. She states she cannot take NSAIDs due to kidney problems.  Past Medical History  Diagnosis Date  . Diabetes mellitus   . Hypertension   . Anemia   . Hep C w/o coma, chronic    Past Surgical History  Procedure Laterality Date  . Tubal ligation     Family History  Problem Relation Age of Onset  . Hypertension Mother   . Cancer Mother   . Diabetes Father   . Hypertension Father    History  Substance Use Topics  . Smoking status: Never Smoker   . Smokeless tobacco: Never Used  . Alcohol Use: No   OB History    Gravida Para Term Preterm AB TAB SAB Ectopic Multiple Living   3 3 3       3      Review of Systems As in history of present illness Allergies  Iodine; Peanut-containing drug products; and Shellfish allergy  Home Medications   Prior to Admission medications   Medication Sig Start Date End Date Taking? Authorizing Provider  amLODipine (NORVASC) 10 MG tablet Take 10 mg by mouth daily.      Historical Provider, MD  cyclobenzaprine (FLEXERIL) 10 MG tablet Take 1 tablet (10 mg total) by mouth 2 (two) times daily as needed for muscle  spasms. 11/23/14   Tatyana Kirichenko, PA-C  gabapentin (NEURONTIN) 100 MG capsule Take 1 capsule by mouth two times daily for one month, then one capsule daily for one month, then discontinue 05/26/14   Lavonia Drafts, MD  HYDROcodone-acetaminophen (NORCO/VICODIN) 5-325 MG per tablet Take 1 tablet by mouth every 6 (six) hours as needed for moderate pain. 03/27/14   Jola Schmidt, MD  Insulin Glargine (LANTUS SOLOSTAR [place]) Inject 31 Units into the skin daily.     Historical Provider, MD  naproxen (NAPROSYN) 500 MG tablet Take 1 tablet (500 mg total) by mouth 2 (two) times daily. Patient not taking: Reported on 12/14/2014 11/23/14   Jeannett Senior, PA-C  predniSONE (DELTASONE) 20 MG tablet Take 2 tablets (40 mg total) by mouth daily. 12/25/14   Melony Overly, MD  traMADol (ULTRAM) 50 MG tablet Take 1 tablet (50 mg total) by mouth every 6 (six) hours as needed. 12/25/14   Melony Overly, MD  valsartan-hydrochlorothiazide (DIOVAN-HCT) 320-25 MG per tablet Take 1 tablet by mouth daily.      Historical Provider, MD   BP 130/70 mmHg  Pulse 78  Temp(Src) 98.6 F (37 C) (Oral)  Resp 18  SpO2 100%  LMP 06/26/2011 Physical Exam  Constitutional: She is oriented to person, place, and time. She appears well-developed and well-nourished. No distress.  Neck: Normal range of motion.  Cardiovascular: Normal rate, regular  rhythm and normal heart sounds.   No murmur heard. Pulmonary/Chest: Effort normal and breath sounds normal. No respiratory distress. She has no wheezes. She has no rales.   She exhibits tenderness.    Areas of tenderness outlined  Musculoskeletal:  Right shoulder: No erythema or edema. No point tenderness. She has full active range of motion. Positive Hawkins and empty can test.  Neurological: She is alert and oriented to person, place, and time.    ED Course  Procedures (including critical care time) ED ECG REPORT   Date: 12/25/2014  Rate: 81  Rhythm: normal sinus rhythm  QRS  Axis: normal  Intervals: normal  ST/T Wave abnormalities: nonspecific T wave changes  Conduction Disutrbances:none  Narrative Interpretation: NSR with flattened t-waves in V2-V5, unchanged from previous  Old EKG Reviewed: unchanged  I have personally reviewed the EKG tracing and agree with the computerized printout as noted.  Labs Review Labs Reviewed - No data to display  Imaging Review No results found.   MDM   1. Trapezius strain, right, initial encounter   2. Strain of right pectoralis muscle, initial encounter    EKG reviewed and nonischemic. Given muscular tenderness, she likely has some muscular strain secondary to lifting the weights at the chiropractor's office. Conservative management with ice/heat, rest, Flexeril. We'll treat with a short course of prednisone given her report of kidney disease. Discussed that this may temporarily elevate her blood sugars. Tramadol given to use as needed for pain. Follow-up with PCP if no improvement in 1 week.    Melony Overly, MD 12/25/14 1017

## 2014-12-25 NOTE — Discharge Instructions (Signed)
Your pain is likely coming from muscle strain. Keep moving your shoulder, but no lifting. Put ice on the sore areas for 20 minutes, followed by heat for 20 minutes. Do this 3 times a day. Take Flexeril up to 3 times a day as needed for muscle aches. Take prednisone 2 tablets daily for the next 5 days. This is to help with inflammation. Watch your blood sugar while taking this medicine. Use the tramadol every 6-8 hours as needed for pain. Do not drive while taking this medicine. You should start to see improvement in the next week, but this will likely take at least 2 weeks to fully heal. As you start to feel better, you can start doing more with the right arm. Follow-up with your doctor in 1 week if no improvement.

## 2014-12-25 NOTE — ED Notes (Signed)
Pt  Reports   Chest  Pain    That   Developed         2  Days  Ago     Pain is   r  Upper  Chest   And  Is  Worse  On movement   And  posistion         Pt   Is    Sitting  Upright  On  The  Exam table  Speaking in   Complete   sentances      Skin  Is  Warm  And  Dry

## 2014-12-27 ENCOUNTER — Ambulatory Visit: Payer: No Typology Code available for payment source | Attending: Internal Medicine | Admitting: Physical Therapy

## 2014-12-27 DIAGNOSIS — M436 Torticollis: Secondary | ICD-10-CM | POA: Insufficient documentation

## 2014-12-27 DIAGNOSIS — M5090 Cervical disc disorder, unspecified, unspecified cervical region: Secondary | ICD-10-CM | POA: Insufficient documentation

## 2014-12-27 DIAGNOSIS — M6281 Muscle weakness (generalized): Secondary | ICD-10-CM

## 2014-12-27 NOTE — Patient Instructions (Signed)
Head Rotation   Slowly turn head to one side, then the other. Hold _5 10__ seconds.  Use fingers to help support head. Repeat __5_ times each side, alternating. Do 1- 2SHOULDER: Flexion - Supine (Cane)   Hold cane in both hands. Raise arms up overhead. Do not allow back to arch. Hold 0-5___ seconds. 5-10___ reps per set, _1-2__ sets per day, __7Cane Overhead - Supine   Cane series added to home, flexion, horizontal abduction , ER/IR, chest press.      _

## 2014-12-27 NOTE — Therapy (Signed)
[place], [place], [address] Phone: [phone]   Fax:  [phone]  Physical Therapy Treatment  Patient Details  Name: JOICE DONZE MRN: [id number] Date of Birth: 1961/05/07 Referring Provider:  Wenda Low, MD  Encounter Date: 12/27/2014      PT End of Session - 12/27/14 1657    Visit Number 2   Number of Visits 8   Date for PT Re-Evaluation 02/08/15   PT Start Time 1504   PT Stop Time 1620   PT Time Calculation (min) 76 min   Activity Tolerance Patient tolerated treatment well;Patient limited by pain      Past Medical History  Diagnosis Date  . Diabetes mellitus   . Hypertension   . Anemia   . Hep C w/o coma, chronic     Past Surgical History  Procedure Laterality Date  . Tubal ligation      There were no vitals filed for this visit.  Visit Diagnosis:  Stiffness of cervical spine  Muscle weakness      Subjective Assessment - 12/27/14 1505    Subjective pain unchanges, has been doing her home exercises.  Fell asleep doing her home exercises.    Strained Rt pec / scapula after moving a weight at the chiropractor The next day.  Had a EKG ruled out pneumonia. .  Has a new medication PRN for inflamation and pain not sure what the name of it is.     Currently in Pain? Yes   Pain Score 7    Pain Location Chest   Pain Orientation Right   Pain Descriptors / Indicators Stabbing;Aching  tense   Pain Radiating Towards neck, shoulder blades, chest.   Aggravating Factors  looking down,    Pain Relieving Factors hot cold , salonpaa patches ,                         [facility] Adult PT Treatment/Exercise - 12/27/14 1520    Neck Exercises: Seated   Neck Retraction 5 reps  5 second holds.  cues needed for technique.   Cervical Rotation 5 reps  using fingers ,   Neck Exercises: Supine   Cervical Isometrics 5 reps;5 secs  rolled towel behind head   Shoulder Exercises:  Supine   Other Supine Exercises Supine cane series 5 reps each   Moist Heat Therapy   Number Minutes Moist Heat 15 Minutes   Moist Heat Location Cervical   Electrical Stimulation   Electrical Stimulation Location cervical   Electrical Stimulation Action IFC   Electrical Stimulation Parameters 6   Electrical Stimulation Goals Pain      Extra time required for exercises due to patient's guarding and extra care to technique and questions asked.            PT Short Term Goals - 12/14/14 1818    PT SHORT TERM GOAL #1   Title "Demonstrate understanding of proper sitting posture, body mechanics for home chores and be more conscious of position and posture throughout the day.    Time 4   Period Weeks   Status New   PT SHORT TERM GOAL #2   Title Cervical rotation left improved to 30 degrees needed for driving   Time 4   Period Weeks   Status New           PT Long Term Goals - 12/14/14 1819    PT LONG TERM GOAL #1  Title Engineer, mining and verbalize techniques to reduce the risk of re-injury including: lifting, posture, body mechanics   Time 8   Period Weeks   Status New   PT LONG TERM GOAL #2   Title Cervical flexion and extension ROM improved to 45 degrees needed for household chores.   Time 8   Period Weeks   Status New   PT LONG TERM GOAL #3   Title Bilateral shoulder flexion/abduction improved to 158 degrees needed for combing hair and other grooming/dressing tasks.   Time 8   Period Weeks   Status New   PT LONG TERM GOAL #4   Title Patient will be independent in a progressive ROM and strengthening home ex program needed to prevent a further decline in function   Time 8   Period Weeks   Status New   PT LONG TERM GOAL #5   Title Neck Disability Index with a moderate self perceived disability rather than severe.(< or= 48%)   Time 8   Period Weeks   Status New               Plan - 12/27/14 1701    PT Next Visit Plan NDI, Review cane, Neck AROM ,  shoulder table slides   Consulted and Agree with Plan of Care Patient        Problem List Patient Active Problem List   Diagnosis Date Noted  . Hepatitis C, chronic 05/15/2012  . Diabetes mellitus type 2, insulin dependent 05/15/2012  . Hypertension 05/15/2012  . Anemia 05/15/2012  . Hot flushes, perimenopausal 05/15/2012    [facility] 12/27/2014, 5:05 PM  [facility] [address] [place], [place], [address] Phone: [phone]   Fax:  [phone]  Melvenia Needles, PTA 12/27/2014 5:05 PM Phone: [phone] Fax: [phone]

## 2015-01-03 ENCOUNTER — Ambulatory Visit: Payer: No Typology Code available for payment source | Admitting: Physical Therapy

## 2015-01-03 DIAGNOSIS — M6281 Muscle weakness (generalized): Secondary | ICD-10-CM

## 2015-01-03 DIAGNOSIS — M436 Torticollis: Secondary | ICD-10-CM

## 2015-01-03 NOTE — Therapy (Signed)
[place], [place], [address] Phone: [phone]   Fax:  [phone]  Physical Therapy Treatment  Patient Details  Name: Maureen Barnes MRN: [id number] Date of Birth: May 09, 1961 Referring Provider:  Wenda Low, MD  Encounter Date: 01/03/2015      PT End of Session - 01/03/15 1822    Visit Number 3   Date for PT Re-Evaluation 02/08/15   PT Start Time 1430   PT Stop Time 1520   PT Time Calculation (min) 50 min   Activity Tolerance Patient limited by pain;Patient tolerated treatment well   Behavior During Therapy [facility] for tasks assessed/performed      Past Medical History  Diagnosis Date  . Diabetes mellitus   . Hypertension   . Anemia   . Hep C w/o coma, chronic     Past Surgical History  Procedure Laterality Date  . Tubal ligation      There were no vitals filed for this visit.  Visit Diagnosis:  Stiffness of cervical spine  Muscle weakness      Subjective Assessment - 01/03/15 1430    Subjective Motion improved side to side.  Has good and bad days was able to walk for exercise anbd felt better.     Currently in Pain? Yes   Pain Score 6    Pain Location Neck   Pain Orientation Right;Left;Posterior   Pain Descriptors / Indicators Aching  will vary,   Pain Type Chronic pain   Pain Radiating Towards chest   Aggravating Factors  looking down or up   Pain Relieving Factors heat, patches                         [facility] Adult PT Treatment/Exercise - 01/03/15 0001    Neck Exercises: Seated   Other Seated Exercise Neck ROM extension with towel for support 3 reps, painful,  neck flexion AA 3 reps painful.    Rotations more motion  measured.   Other Seated Exercise scapular retractions   Neck Exercises: Supine   Shoulder Flexion 5 reps  narrow grip yellow band. added to home   Moist Heat Therapy   Number Minutes Moist Heat 15 Minutes   Moist Heat Location Cervical    Electrical Stimulation   Electrical Stimulation Location c   Electrical Stimulation Action IFC   Electrical Stimulation Parameters 6   Electrical Stimulation Goals Pain    AROM Neck 15 degrees flexion, 30 degrees extension,  Rotation RT 55, LT 70.            PT Education - 01/03/15 1821    Education provided Yes   Education Details yellow band er   [employer]) Educated Patient   Methods Explanation;Demonstration;Tactile cues;Verbal cues;Handout   Comprehension Verbalized understanding          PT Short Term Goals - 12/14/14 1818    PT SHORT TERM GOAL #1   Title "Demonstrate understanding of proper sitting posture, body mechanics for home chores and be more conscious of position and posture throughout the day.    Time 4   Period Weeks   Status New   PT SHORT TERM GOAL #2   Title Cervical rotation left improved to 30 degrees needed for driving   Time 4   Period Weeks   Status New           PT Long Term Goals - 12/14/14 1819    PT LONG TERM GOAL #  1   Title Engineer, mining and verbalize techniques to reduce the risk of re-injury including: lifting, posture, body mechanics   Time 8   Period Weeks   Status New   PT LONG TERM GOAL #2   Title Cervical flexion and extension ROM improved to 45 degrees needed for household chores.   Time 8   Period Weeks   Status New   PT LONG TERM GOAL #3   Title Bilateral shoulder flexion/abduction improved to 158 degrees needed for combing hair and other grooming/dressing tasks.   Time 8   Period Weeks   Status New   PT LONG TERM GOAL #4   Title Patient will be independent in a progressive ROM and strengthening home ex program needed to prevent a further decline in function   Time 8   Period Weeks   Status New   PT LONG TERM GOAL #5   Title Neck Disability Index with a moderate self perceived disability rather than severe.(< or= 48%)   Time 8   Period Weeks   Status New               Plan - 01/03/15 1823     Clinical Impression Statement Progress toward Rotation goals  Shorter session patient late.   PT Next Visit Plan NDI, Review cane, Neck AROM , shoulder table slides  Try Ranger?   Consulted and Agree with Plan of Care Patient        Problem List Patient Active Problem List   Diagnosis Date Noted  . Hepatitis C, chronic 05/15/2012  . Diabetes mellitus type 2, insulin dependent 05/15/2012  . Hypertension 05/15/2012  . Anemia 05/15/2012  . Hot flushes, perimenopausal 05/15/2012    [facility] 01/03/2015, 6:26 PM  [facility] [address] [place], [place], [address] Phone: [phone]   Fax:  [phone]  Melvenia Needles, PTA 01/03/2015 6:26 PM Phone: [phone] Fax: [phone]

## 2015-01-11 ENCOUNTER — Ambulatory Visit: Payer: No Typology Code available for payment source | Admitting: Physical Therapy

## 2015-01-11 DIAGNOSIS — M6281 Muscle weakness (generalized): Secondary | ICD-10-CM

## 2015-01-11 DIAGNOSIS — M5090 Cervical disc disorder, unspecified, unspecified cervical region: Secondary | ICD-10-CM

## 2015-01-11 DIAGNOSIS — M436 Torticollis: Secondary | ICD-10-CM

## 2015-01-11 NOTE — Therapy (Signed)
[place], [place], [address] Phone: [phone]   Fax:  [phone]  Physical Therapy Treatment  Patient Details  Name: Maureen Barnes MRN: [id number] Date of Birth: 05-24-1961 Referring Provider:  Wenda Low, MD  Encounter Date: 01/11/2015      PT End of Session - 01/11/15 1514    Visit Number 4   Number of Visits 8   Date for PT Re-Evaluation 02/08/15   Authorization Type GCC[employer] 100% expires 03/07/15   PT Start Time 1330   PT Stop Time 1430   PT Time Calculation (min) 60 min   Activity Tolerance Patient limited by pain      Past Medical History  Diagnosis Date  . Diabetes mellitus   . Hypertension   . Anemia   . Hep C w/o coma, chronic     Past Surgical History  Procedure Laterality Date  . Tubal ligation      There were no vitals filed for this visit.  Visit Diagnosis:  Stiffness of cervical spine  Muscle weakness  Cervical disc disease      Subjective Assessment - 01/11/15 1342    Subjective I'm doing terrible today.  Woke up feeling bad.  It hurts to lift my head up off the pillow.  Better with neck pillow and heat.  I like the exercise she showed me but later that night I start feeling bad all over again.     Currently in Pain? Yes   Pain Score 6    Pain Location Neck  headache   Pain Orientation Right;Left   Pain Type Chronic pain   Pain Onset More than a month ago   Pain Frequency Constant   Aggravating Factors  lifting head off pillow, lying down and trying to watch TV   Pain Relieving Factors heat            [facility] PT Assessment - 01/11/15 1406    AROM   Right Shoulder Flexion 145 Degrees   Right Shoulder ABduction 145 Degrees   Left Shoulder Flexion 145 Degrees   Left Shoulder ABduction 145 Degrees   Cervical Flexion 25   Cervical Extension 43   Cervical - Right Side Bend 44   Cervical - Left Side Bend 40   Cervical - Right Rotation 38   Cervical -  Left Rotation 41                     [facility] Adult PT Treatment/Exercise - 01/11/15 1353    Neck Exercises: Supine   Neck Retraction 5 reps   Capital Flexion 5 reps   Shoulder Flexion 5 reps;Right;Left  UE Ranger   Moist Heat Therapy   Number Minutes Moist Heat 15 Minutes   Moist Heat Location Cervical   Electrical Stimulation   Electrical Stimulation Location cervical   Electrical Stimulation Action IFC   Electrical Stimulation Parameters 6   Electrical Stimulation Goals Pain   Manual Therapy   Manual Therapy Soft tissue mobilization;Myofascial release;Manual Traction   Myofascial Release very light to bilateral upper traps, rhomboids and subscapularis;  post treatment applied Biofreeze for pain control   Manual Traction attempted but to painful to tolerate                  PT Short Term Goals - 01/11/15 1520    PT SHORT TERM GOAL #1   Title "Demonstrate understanding of proper sitting posture, body mechanics for home chores and be  more conscious of position and posture throughout the day.    Time 4   Status On-going   PT SHORT TERM GOAL #2   Title Cervical rotation left improved to 30 degrees needed for driving   Time 4   Period Weeks   Status Partially Met           PT Long Term Goals - 01/11/15 1520    PT LONG TERM GOAL #1   Title "Demonstrate and verbalize techniques to reduce the risk of re-injury including: lifting, posture, body mechanics   Time 8   Period Weeks   Status On-going   PT LONG TERM GOAL #2   Title Cervical flexion and extension ROM improved to 45 degrees needed for household chores.   Time 8   Period Weeks   Status On-going   PT LONG TERM GOAL #3   Title Bilateral shoulder flexion/abduction improved to 158 degrees needed for combing hair and other grooming/dressing tasks.   Time 8   Status On-going   PT LONG TERM GOAL #4   Title Patient will be independent in a progressive ROM and strengthening home ex program needed to  prevent a further decline in function   Time 8   Period Weeks   Status On-going   PT LONG TERM GOAL #5   Title Neck Disability Index with a moderate self perceived disability rather than severe.(< or= 48%)   Time 8   Period Weeks   Status On-going               Plan - 01/11/15 1515    Clinical Impression Statement Patient very painful with all attempted AROM despite being very low intensity.  She is unable to tolerate gentle manual cervical distraction of suboccipital release.  Some improvement in cervical AROM since eval.  Very slow progress toward goals.  Continue on weekly basis.  If no progress in next 3 visits, will discharge from PT.  Patient given dry needling info and she will consider although patient may not be the best candidate secondary to sensitivity to even light manual techniques.     PT Next Visit Plan ? dry needling; seated for standing UE Ranger; light yellow TB scapular strengthening        Problem List Patient Active Problem List   Diagnosis Date Noted  . Hepatitis C, chronic 05/15/2012  . Diabetes mellitus type 2, insulin dependent 05/15/2012  . Hypertension 05/15/2012  . Anemia 05/15/2012  . Hot flushes, perimenopausal 05/15/2012    Alvera Singh 01/11/2015, 3:22 PM  [facility] [address] [place], [place], [address] Phone: [phone]   Fax:  [phone]   Ruben Im, PT 01/11/2015 3:23 PM Phone: [phone] Fax: [phone]

## 2015-01-17 ENCOUNTER — Other Ambulatory Visit: Payer: Self-pay

## 2015-01-18 ENCOUNTER — Ambulatory Visit: Payer: No Typology Code available for payment source | Admitting: Physical Therapy

## 2015-01-18 DIAGNOSIS — M436 Torticollis: Secondary | ICD-10-CM

## 2015-01-18 DIAGNOSIS — M5090 Cervical disc disorder, unspecified, unspecified cervical region: Secondary | ICD-10-CM

## 2015-01-18 DIAGNOSIS — M6281 Muscle weakness (generalized): Secondary | ICD-10-CM

## 2015-01-18 NOTE — Therapy (Signed)
[place], [place], [address] Phone: [phone]   Fax:  [phone]  Physical Therapy Treatment  Patient Details  Name: Maureen Barnes MRN: [id number] Date of Birth: 16-Aug-1960 Referring Provider:  Wenda Low, MD  Encounter Date: 01/18/2015      PT End of Session - 01/18/15 1051    Visit Number 5   Number of Visits 8   Date for PT Re-Evaluation 02/08/15   Authorization Type GCCN 100% expires 03/07/15   PT Start Time 1035   PT Stop Time 1120   PT Time Calculation (min) 45 min   Activity Tolerance Patient tolerated treatment well   Behavior During Therapy [facility] for tasks assessed/performed      Past Medical History  Diagnosis Date  . Diabetes mellitus   . Hypertension   . Anemia   . Hep C w/o coma, chronic     Past Surgical History  Procedure Laterality Date  . Tubal ligation      There were no vitals filed for this visit.  Visit Diagnosis:  Stiffness of cervical spine  Muscle weakness  Cervical disc disease      Subjective Assessment - 01/18/15 1035    Subjective I am sorry I am late today.   I am hurting 6/10.      Currently in Pain? Yes   Pain Score 6    Pain Location Neck   Pain Orientation Right;Left   Pain Descriptors / Indicators Aching   Pain Type Chronic pain                         [facility] Adult PT Treatment/Exercise - 01/18/15 1100    Neck Exercises: Supine   Other Supine Exercise supine scap stabilizers x 10 each with yellow t band, flexion, horiz abd, diagonals and ER all bil   Moist Heat Therapy   Number Minutes Moist Heat 15 Minutes   Moist Heat Location Cervical   Electrical Stimulation   Electrical Stimulation Location cervical   Electrical Stimulation Action IFC   Electrical Stimulation Parameters 8   Electrical Stimulation Goals Pain                PT Education - 01/18/15 1051    Education provided Yes   Education Details  scap stabilizer HEP in supine with yellow T band   Person(s) Educated Patient   Methods Explanation;Demonstration;Verbal cues;Tactile cues;Handout   Comprehension Verbalized understanding;Returned demonstration;Need further instruction          PT Short Term Goals - 01/18/15 1212    PT SHORT TERM GOAL #1   Title "Demonstrate understanding of proper sitting posture, body mechanics for home chores and be more conscious of position and posture throughout the day.    Time 4   Period Weeks   Status On-going   PT SHORT TERM GOAL #2   Title Cervical rotation left improved to 30 degrees needed for driving   Time 4   Period Weeks   Status Partially Met           PT Long Term Goals - 01/18/15 1213    PT LONG TERM GOAL #1   Title "Demonstrate and verbalize techniques to reduce the risk of re-injury including: lifting, posture, body mechanics   Time 8   Period Weeks   Status On-going   PT LONG TERM GOAL #2   Title Cervical flexion and extension ROM improved to 45 degrees needed for  household chores.   Time 8   Period Weeks   Status On-going   PT LONG TERM GOAL #3   Title Bilateral shoulder flexion/abduction improved to 158 degrees needed for combing hair and other grooming/dressing tasks.   Time 8   Period Weeks   Status On-going   PT LONG TERM GOAL #4   Title Patient will be independent in a progressive ROM and strengthening home ex program needed to prevent a further decline in function   Time 8   Period Weeks   Status On-going   PT LONG TERM GOAL #5   Title Neck Disability Index with a moderate self perceived disability rather than severe.(< or= 48%)   Time 8   Period Weeks   Status On-going               Plan - 01/18/15 1052    Clinical Impression Statement Pt arrives to clinic 20 minutes late for appt.  Pt given brief HEP for scapular stabiilzation and handout.  Pt also instructed about attendance policy.  Pt initially 6/10 and 4/10 post treatment and exercise    PT Next Visit Plan review scapular stabilizer with yellow T band and progress as needed, possible dry needling next visit. Assess goals next visit   Consulted and Agree with Plan of Care Patient        Problem List Patient Active Problem List   Diagnosis Date Noted  . Hepatitis C, chronic 05/15/2012  . Diabetes mellitus type 2, insulin dependent 05/15/2012  . Hypertension 05/15/2012  . Anemia 05/15/2012  . Hot flushes, perimenopausal 05/15/2012    Voncille Lo, PT 01/18/2015 12:18 PM Phone: [phone] Fax: [facility] [address] [place], [place], [address] Phone: [phone]   Fax:  [phone]

## 2015-01-18 NOTE — Patient Instructions (Signed)
Over Head Pull: Narrow Grip       On back, knees bent, feet flat, band across thighs, elbows straight but relaxed. Pull hands apart (start). Keeping elbows straight, bring arms up and over head, hands toward floor. Keep pull steady on band. Hold momentarily. Return slowly, keeping pull steady, back to start. Repeat __10 _ times. Band color ___yellow___   Side Pull: Double Arm   On back, knees bent, feet flat. Arms perpendicular to body, shoulder level, elbows straight but relaxed. Pull arms out to sides, elbows straight. Resistance band comes across collarbones, hands toward floor. Hold momentarily. Slowly return to starting position. Repeat _10__ times. Band color ___yellow__   Sash   On back, knees bent, feet flat, left hand on left hip, right hand above left. Pull right arm DIAGONALLY (hip to shoulder) across chest. Bring right arm along head toward floor. Hold momentarily. Slowly return to starting position. Repeat _10__ times. Do with left arm. Band color __yellow____  Can to with Left arm as well  Shoulder Rotation: Double Arm   On back, knees bent, feet flat, elbows tucked at sides, bent 90, hands palms up. Pull hands apart and down toward floor, keeping elbows near sides. Hold momentarily. Slowly return to starting position. Repeat _10__ times. Band color _yellow_____   Voncille Lo, PT 01/18/2015 10:48 AM Phone: [phone] Fax: [phone]

## 2015-01-27 ENCOUNTER — Ambulatory Visit: Payer: No Typology Code available for payment source | Attending: Internal Medicine | Admitting: Physical Therapy

## 2015-01-27 DIAGNOSIS — M6281 Muscle weakness (generalized): Secondary | ICD-10-CM | POA: Insufficient documentation

## 2015-01-27 DIAGNOSIS — M436 Torticollis: Secondary | ICD-10-CM | POA: Insufficient documentation

## 2015-01-27 DIAGNOSIS — M5090 Cervical disc disorder, unspecified, unspecified cervical region: Secondary | ICD-10-CM

## 2015-01-27 NOTE — Therapy (Signed)
[place], [place], [address] Phone: [phone]   Fax:  [phone]  Physical Therapy Treatment  Patient Details  Name: Maureen Barnes MRN: [id number] Date of Birth: 27-Sep-1960 Referring Provider:  Wenda Low, MD  Encounter Date: 01/27/2015      PT End of Session - 01/27/15 1150    Visit Number 6   Number of Visits 8   Date for PT Re-Evaluation 02/08/15   Authorization Type GCCN 100% expires 03/07/15   PT Start Time 1100   PT Stop Time 1200   PT Time Calculation (min) 60 min   Activity Tolerance Patient tolerated treatment well   Behavior During Therapy [facility] for tasks assessed/performed      Past Medical History  Diagnosis Date  . Diabetes mellitus   . Hypertension   . Anemia   . Hep C w/o coma, chronic     Past Surgical History  Procedure Laterality Date  . Tubal ligation      There were no vitals filed for this visit.  Visit Diagnosis:  Stiffness of cervical spine  Muscle weakness  Cervical disc disease      Subjective Assessment - 01/27/15 1109    Subjective "I am doing ok, I can tell that I am doing better and the tension is getting easier" today pain is 5/10    Currently in Pain? Yes   Pain Score 5    Pain Location Neck   Pain Orientation Right;Left   Pain Descriptors / Indicators Aching   Pain Type Chronic pain   Pain Onset More than a month ago                         [facility] Adult PT Treatment/Exercise - 01/27/15 0001    Neck Exercises: Machines for Strengthening   UBE (Upper Arm Bike) L1 x 6 min  alt dir every 2 min   Neck Exercises: Supine   Neck Retraction 10 reps  with 5 sec, VC for chin tuck   Capital Flexion 5 reps   Other Supine Exercise supine scap stabilizers x 10 each with yellow t band, flexion, horiz abd, diagonals and ER all bil   Moist Heat Therapy   Number Minutes Moist Heat 10 Minutes   Moist Heat Location Cervical   Electrical Stimulation   Electrical Stimulation Location cervical/ Left upper trap   Electrical Stimulation Action IFC   Electrical Stimulation Parameters 10 min, 100%scan, to tolerance   Electrical Stimulation Goals Pain   Manual Therapy   Manual Therapy Myofascial release;Joint mobilization   Joint Mobilization thoracic mobilizations grade 3 P/A   Myofascial Release sub-occipital release, and trigger point releaes of bil upper traps and levator                 PT Education - 01/27/15 1150    Education provided Yes   Education Details education on forward head posture, and importance of chin tuck/ retractions   Person(s) Educated Patient   Methods Explanation   Comprehension Verbalized understanding          PT Short Term Goals - 01/27/15 1152    PT SHORT TERM GOAL #1   Title "Demonstrate understanding of proper sitting posture, body mechanics for home chores and be more conscious of position and posture throughout the day.    Time 4   Period Weeks   Status On-going   PT SHORT TERM GOAL #2   Title  Cervical rotation left improved to 30 degrees needed for driving   Time 4   Period Weeks   Status Partially Met           PT Long Term Goals - 01/27/15 1152    PT LONG TERM GOAL #1   Title "Demonstrate and verbalize techniques to reduce the risk of re-injury including: lifting, posture, body mechanics   Time 8   Period Weeks   Status On-going   PT LONG TERM GOAL #2   Title Cervical flexion and extension ROM improved to 45 degrees needed for household chores.   Time 8   Period Weeks   PT LONG TERM GOAL #3   Title Bilateral shoulder flexion/abduction improved to 158 degrees needed for combing hair and other grooming/dressing tasks.   Time 8   Period Weeks   Status On-going   PT LONG TERM GOAL #4   Title Patient will be independent in a progressive ROM and strengthening home ex program needed to prevent a further decline in function   Time 8   Period Weeks    Status On-going   PT LONG TERM GOAL #5   Title Neck Disability Index with a moderate self perceived disability rather than severe.(< or= 48%)   Time 8   Period Weeks   Status On-going               Plan - 01/27/15 1151    Clinical Impression Statement Kameren presents to therapy with report of 5/10 pain in the neck. No new goals met this visit. Reports she has been consistent with her HEP which has helped. Educated about posture and forward head posture and benefit of good posture to alleviate abnormal tissue pull especially while sitting at her computer. She tolerated treatment well today and reported pain dropped to 4/10. Educated benefit of setting changing position periodically to avoid sitting for long periods of time.    PT Next Visit Plan possible dry needling next visit. modalities PRN, scapular stabilization and manual for shoulder/neck muscularature release   PT Home Exercise Plan posture education, and deep neck flexors   Consulted and Agree with Plan of Care Patient        Problem List Patient Active Problem List   Diagnosis Date Noted  . Hepatitis C, chronic 05/15/2012  . Diabetes mellitus type 2, insulin dependent 05/15/2012  . Hypertension 05/15/2012  . Anemia 05/15/2012  . Hot flushes, perimenopausal 05/15/2012   Starr Lake PT, DPT, LAT, ATC  01/27/2015  12:07 PM      [facility] [address] [place], [place], [address] Phone: [phone]   Fax:  [phone]

## 2015-02-02 ENCOUNTER — Ambulatory Visit: Payer: No Typology Code available for payment source | Admitting: Physical Therapy

## 2015-02-07 ENCOUNTER — Ambulatory Visit: Payer: No Typology Code available for payment source | Admitting: Physical Therapy

## 2015-02-07 DIAGNOSIS — M6281 Muscle weakness (generalized): Secondary | ICD-10-CM

## 2015-02-07 DIAGNOSIS — M5090 Cervical disc disorder, unspecified, unspecified cervical region: Secondary | ICD-10-CM

## 2015-02-07 DIAGNOSIS — M436 Torticollis: Secondary | ICD-10-CM

## 2015-02-07 NOTE — Therapy (Signed)
[place], [place], [address] Phone: [phone]   Fax:  [phone]  Physical Therapy Treatment  Patient Details  Name: Maureen Barnes MRN: [id number] Date of Birth: Apr 13, 1961 Referring Provider:  Wenda Low, MD  Encounter Date: 02/07/2015      PT End of Session - 02/07/15 1423    Visit Number 7   Number of Visits 8   Date for PT Re-Evaluation 02/08/15   PT Start Time 1330   PT Stop Time 1433   PT Time Calculation (min) 63 min   Activity Tolerance Patient tolerated treatment well;Patient limited by pain   Behavior During Therapy [facility] for tasks assessed/performed      Past Medical History  Diagnosis Date  . Diabetes mellitus   . Hypertension   . Anemia   . Hep C w/o coma, chronic     Past Surgical History  Procedure Laterality Date  . Tubal ligation      There were no vitals filed for this visit.  Visit Diagnosis:  Stiffness of cervical spine  Muscle weakness  Cervical disc disease      Subjective Assessment - 02/07/15 1336    Subjective Strained Lt neck stretching yesterday morning arms were overhead a working into flexion.  felt strain into shoulder Lt     Currently in Pain? Yes   Pain Score 6    Pain Location Neck   Pain Orientation Left;Posterior   Pain Descriptors / Indicators Aching  was shooting pain when it happened.  stabbing   Pain Radiating Towards shoulder   Aggravating Factors  stretching end range   Pain Relieving Factors 1/2 muscle relaxer, tylenol, [facility] Adult PT Treatment/Exercise - 02/07/15 1342    Neck Exercises: Seated   Cervical Rotation Limitations RT 40. Lt 37 limited by pain 5-6/10  AROM   Lateral Flexion Limitations 20 degrees both limited by pain  AROM   Other Seated Exercise Neck flexion 5 degrees, extension 25 degrees.  AROM    Shoulder Exercises: Seated   Flexion Limitations  108 degrees AROM, limited by pain.   Moist Heat Therapy   Number Minutes Moist Heat 15 Minutes   Moist Heat Location Cervical   Electrical Stimulation   Electrical Stimulation Location cervical. upper trap   Electrical Stimulation Action IFC   Electrical Stimulation Parameters 7   Electrical Stimulation Goals Pain   Manual Therapy   Manual Therapy Soft tissue mobilization   Manual therapy comments neck, Lt upper traps, levator SCM,  care taken to aviod sub occipital area at patient's request.                  PT Short Term Goals - 01/27/15 1152    PT SHORT TERM GOAL #1   Title "Demonstrate understanding of proper sitting posture, body mechanics for home chores and be more conscious of position and posture throughout the day.    Time 4   Period Weeks   Status On-going   PT SHORT TERM GOAL #2   Title Cervical rotation left improved to 30 degrees needed for driving   Time 4   Period Weeks   Status Partially Met           PT Long Term Goals - 02/07/15 1425    PT LONG TERM GOAL #2  Title Cervical flexion and extension ROM improved to 45 degrees needed for household chores.   Baseline 5 degrees   Time 8   Period Weeks   Status On-going   PT LONG TERM GOAL #3   Title Bilateral shoulder flexion/abduction improved to 158 degrees needed for combing hair and other grooming/dressing tasks.   Baseline see flow sheet   Time 8   Period Weeks   Status On-going               Plan - 02/07/15 1423    Clinical Impression Statement pain flare with stretching arms overhead over the weekend.  Neck and shoulder ROM limited. see flow sheet.  No new goals reported.   PT Next Visit Plan back to scapular exercises. DN?  work toward goals.        Problem List Patient Active Problem List   Diagnosis Date Noted  . Hepatitis C, chronic 05/15/2012  . Diabetes mellitus type 2, insulin dependent 05/15/2012  . Hypertension 05/15/2012  . Anemia 05/15/2012  . Hot flushes,  perimenopausal 05/15/2012    [facility] 02/07/2015, 3:44 PM  [facility] [address] [place], [place], [address] Phone: [phone]   Fax:  [phone]     Melvenia Needles, PTA 02/07/2015 3:44 PM Phone: [phone] Fax: [phone]

## 2015-02-15 ENCOUNTER — Ambulatory Visit: Payer: No Typology Code available for payment source | Admitting: Rehabilitative and Restorative Service Providers"

## 2015-02-15 DIAGNOSIS — M6281 Muscle weakness (generalized): Secondary | ICD-10-CM

## 2015-02-15 DIAGNOSIS — M436 Torticollis: Secondary | ICD-10-CM

## 2015-02-15 DIAGNOSIS — M5090 Cervical disc disorder, unspecified, unspecified cervical region: Secondary | ICD-10-CM

## 2015-02-15 NOTE — Therapy (Signed)
[place], [place], [address] Phone: [phone]   Fax:  [phone]  Physical Therapy Treatment/Renewal  Patient Details  Name: Maureen Barnes MRN: [id number] Date of Birth: March 30, 1961 Referring Provider:  Wenda Low, MD  Encounter Date: 02/15/2015      PT End of Session - 02/15/15 1502    Visit Number 8   Number of Visits 8   Date for PT Re-Evaluation 03/15/15   Authorization Type GCCN 100% expires 03/07/15   PT Start Time 1421   PT Stop Time 1511   PT Time Calculation (min) 50 min   Activity Tolerance Patient tolerated treatment well   Behavior During Therapy [facility] for tasks assessed/performed      Past Medical History  Diagnosis Date  . Diabetes mellitus   . Hypertension   . Anemia   . Hep C w/o coma, chronic     Past Surgical History  Procedure Laterality Date  . Tubal ligation      There were no vitals filed for this visit.  Visit Diagnosis:  Stiffness of cervical spine  Muscle weakness  Cervical disc disease      Subjective Assessment - 02/15/15 1433    Subjective 50% improvement reported; 3/10 pain primarily L Levator/Upper Trap   Pertinent History DM, Hep C   Limitations House hold activities;Sitting   How long can you sit comfortably? 15-30 min   How long can you stand comfortably? 15-20 min   How long can you walk comfortably? 10-15 min   Diagnostic tests x-ray of neck    Patient Stated Goals get better, feel better, get rid of the pain   Currently in Pain? Yes   Pain Score 3    Pain Location Neck   Pain Orientation Left;Posterior   Pain Descriptors / Indicators Aching;Sore   Pain Type Chronic pain   Pain Radiating Towards L shoulder   Pain Onset More than a month ago   Pain Frequency Intermittent   Aggravating Factors  mobility   Pain Relieving Factors meds, heat   Effect of Pain on Daily Activities activities at home   Multiple Pain Sites No       Cervical AROM flex 17, ext 26, R lateral flexion 24, L lateral flex 31, bil rot 30-35 with pain with all movements but especially along L Upper Trap.                   [place] Adult PT Treatment/Exercise - 02/15/15 0001    Neck Exercises: Seated   Other Seated Exercise Arm bike posterior level 1 x 4 min with PT verbal cues for scapular retract and posture; levator stretch 2x30 sec each side x 3 sets at different times during tx   Neck Exercises: Supine   Other Supine Exercise chin tucks x 20, chin tucks with bil scapular depression x 20, chin tucks with bil shoulder ext x 20, chin tuck with bil shoulder ER x 20   Moist Heat Therapy   Number Minutes Moist Heat 15 Minutes   Moist Heat Location Cervical   Manual Therapy   Manual Therapy --  soft tissue work/deep tissue work L Upper Trap seated                  PT Short Term Goals - 02/15/15 1454    PT SHORT TERM GOAL #1   Title "Demonstrate understanding of proper sitting posture, body mechanics for home chores and be more conscious of  position and posture throughout the day.    Time 4   Period Weeks   Status On-going   PT SHORT TERM GOAL #2   Title Cervical rotation bil improved to 45 degrees needed for driving   Time 4   Period Weeks   Status Revised   PT SHORT TERM GOAL #3   Title Pt will increase cervical flexion to 45 degrees to assist with reading   Baseline 17 degrees   Time 4   Period Weeks   Status New           PT Long Term Goals - 02/15/15 1500    PT LONG TERM GOAL #1   Title "Demonstrate and verbalize techniques to reduce the risk of re-injury including: lifting, posture, body mechanics   Time 8   Period Weeks   Status On-going   PT LONG TERM GOAL #2   Title Cervical flexion, ext, lateral flexion ROM improved to 45-50 degrees needed for household chores.   Time 8   Period Weeks   PT LONG TERM GOAL #3   Title Bilateral shoulder flexion/abduction improved to 158 degrees needed for  combing hair and other grooming/dressing tasks.   Time 8   Period Weeks   Status Revised   PT LONG TERM GOAL #4   Title Patient will be independent in a progressive ROM and strengthening home ex program needed to prevent a further decline in function   Time 8   Period Weeks   Status On-going               Plan - 02/15/15 1452    Clinical Impression Statement pt reports pain to be 50% improved; however, she continues to exhibit bil Upper Trap tightnesss L > R with primary c/o L Upper Trap pain; posture is poor   Pt will benefit from skilled therapeutic intervention in order to improve on the following deficits Pain;Hypomobility;Decreased strength;Increased muscle spasms;Decreased range of motion   Rehab Potential Good   Clinical Impairments Affecting Rehab Potential 3 risk factors for  developing adhesive capsulitis   PT Frequency 2x / week   PT Duration 4 weeks   PT Treatment/Interventions ADLs/Self Care Home Management;Electrical Stimulation;Cryotherapy;Moist Heat;Ultrasound;Therapeutic exercise;Patient/family education;Manual techniques;Dry needling;Taping   PT Next Visit Plan DN, increased scapular therex, postural awareness education, manual therapy L Upper Trap, first rib mob   PT Home Exercise Plan no new HEP issued   Consulted and Agree with Plan of Care Patient        Problem List Patient Active Problem List   Diagnosis Date Noted  . Hepatitis C, chronic 05/15/2012  . Diabetes mellitus type 2, insulin dependent 05/15/2012  . Hypertension 05/15/2012  . Anemia 05/15/2012  . Hot flushes, perimenopausal 05/15/2012    Myra Rude, PT 02/15/2015, 3:13 PM  [facility] [address] [place], [place], [address] Phone: [phone]   Fax:  [phone]   .

## 2015-02-15 NOTE — Patient Instructions (Signed)
Advised pt to perform HEP at least 2-3x/day to gain most benefit

## 2015-03-02 ENCOUNTER — Ambulatory Visit: Payer: No Typology Code available for payment source | Attending: Internal Medicine | Admitting: Physical Therapy

## 2015-03-02 DIAGNOSIS — M6281 Muscle weakness (generalized): Secondary | ICD-10-CM | POA: Insufficient documentation

## 2015-03-02 DIAGNOSIS — M5090 Cervical disc disorder, unspecified, unspecified cervical region: Secondary | ICD-10-CM

## 2015-03-02 DIAGNOSIS — M436 Torticollis: Secondary | ICD-10-CM

## 2015-03-02 NOTE — Patient Instructions (Signed)
Over Head Pull: Narrow Grip       On back, knees bent, feet flat, band across thighs, elbows straight but relaxed. Pull hands apart (start). Keeping elbows straight, bring arms up and over head, hands toward floor. Keep pull steady on band. Hold momentarily. Return slowly, keeping pull steady, back to start. Repeat _10-20__ times. Band color _yellow_____   Webb Laws wide grip 10-20 reps.  Side Pull: Double Arm   On back, knees bent, feet flat. Arms perpendicular to body, shoulder level, elbows straight but relaxed. Pull arms out to sides, elbows straight. Resistance band comes across collarbones, hands toward floor. Hold momentarily. Slowly return to starting position. Repeat 10-20___ times. Band coloryellow _____   Sash   On back, knees bent, feet flat, left hand on left hip, right hand above left. Pull right arm DIAGONALLY (hip to shoulder) across chest. Bring right arm along head toward floor. Hold momentarily. Slowly return to starting position. Repeat _10-20__ times. Do with left arm. Band color ___yellow___   Shoulder Rotation: Double Arm   On back, knees bent, feet flat, elbows tucked at sides, bent 90, hands palms up. Pull hands apart and down toward floor, keeping elbows near sides. Hold momentarily. Slowly return to starting position. Repeat _10-20__ times. Band color __Yellow____

## 2015-03-02 NOTE — Therapy (Signed)
[place], [place], [address] Phone: [phone]   Fax:  [phone]  Physical Therapy Treatment  Patient Details  Name: Maureen Barnes MRN: [id number] Date of Birth: 05-25-1961 Referring Provider:  Wenda Low, MD  Encounter Date: 03/02/2015      PT End of Session - 03/02/15 1737    Visit Number 9   Number of Visits 8   Date for PT Re-Evaluation 03/15/15   PT Start Time 1330   PT Stop Time 1430   PT Time Calculation (min) 60 min   Activity Tolerance Patient tolerated treatment well   Behavior During Therapy [facility] for tasks assessed/performed      Past Medical History  Diagnosis Date  . Diabetes mellitus   . Hypertension   . Anemia   . Hep C w/o coma, chronic     Past Surgical History  Procedure Laterality Date  . Tubal ligation      There were no vitals filed for this visit.  Visit Diagnosis:  Stiffness of cervical spine  Muscle weakness  Cervical disc disease      Subjective Assessment - 03/02/15 1337    Subjective 5/10,  Massage helped.   Currently in Pain? Yes   Pain Score 5    Pain Location Neck   Pain Orientation Left;Posterior   Pain Radiating Towards Lt shoulder, Rt shoulder   Pain Frequency Intermittent   Aggravating Factors  reading,   Computer    Pain Relieving Factors she copies the book and reads the chapters , it it a lot lighter,  She has positioned the laptop higher   Multiple Pain Sites Yes  Has low bach pain                         [facility] Adult PT Treatment/Exercise - 03/02/15 1351    Neck Exercises: Supine   Other Supine Exercise 5 reps, cues   Shoulder Exercises: Supine   Theraband Level (Shoulder External Rotation) Level 1 (Yellow)  10   Theraband Level (Shoulder Flexion) Level 1 (Yellow)  Cues 7 reps   Other Supine Exercises head press, shoulder press, towel roll used,    Other Supine Exercises supine scapular stabilization6  different   Moist Heat Therapy   Number Minutes Moist Heat 15 Minutes   Moist Heat Location Cervical   Manual Therapy   Manual Therapy Soft tissue mobilization   Soft tissue mobilization Levator, scaleens, uppertrap, paraspinals.  followed by stretching upper traps and levator                PT Education - 03/02/15 1404    Education provided Yes   Education Details supine scap band   Person(s) Educated Patient   Methods Explanation;Demonstration;Tactile cues;Verbal cues;Handout   Comprehension Verbalized understanding;Returned demonstration          PT Short Term Goals - 02/15/15 1454    PT SHORT TERM GOAL #1   Title "Demonstrate understanding of proper sitting posture, body mechanics for home chores and be more conscious of position and posture throughout the day.    Time 4   Period Weeks   Status On-going   PT SHORT TERM GOAL #2   Title Cervical rotation bil improved to 45 degrees needed for driving   Time 4   Period Weeks   Status Revised   PT SHORT TERM GOAL #3   Title Pt will increase cervical flexion to 45 degrees to assist  with reading   Baseline 17 degrees   Time 4   Period Weeks   Status New           PT Long Term Goals - 02/15/15 1500    PT LONG TERM GOAL #1   Title "Demonstrate and verbalize techniques to reduce the risk of re-injury including: lifting, posture, body mechanics   Time 8   Period Weeks   Status On-going   PT LONG TERM GOAL #2   Title Cervical flexion, ext, lateral flexion ROM improved to 45-50 degrees needed for household chores.   Time 8   Period Weeks   PT LONG TERM GOAL #3   Title Bilateral shoulder flexion/abduction improved to 158 degrees needed for combing hair and other grooming/dressing tasks.   Time 8   Period Weeks   Status Revised   PT LONG TERM GOAL #4   Title Patient will be independent in a progressive ROM and strengthening home ex program needed to prevent a further decline in function   Time 8   Period  Weeks   Status On-going               Plan - 03/02/15 1738    Clinical Impression Statement Has used 9 visits, 8 in plan  POC 03/15/2015  .  Will need visit extension?  Still in POC.  Progress toward home exercise goals.   PT Next Visit Plan Review Bands supine for scapula   PT Home Exercise Plan supine bands   Consulted and Agree with Plan of Care Patient    FOTO Next visit    Problem List Patient Active Problem List   Diagnosis Date Noted  . Hepatitis C, chronic 05/15/2012  . Diabetes mellitus type 2, insulin dependent 05/15/2012  . Hypertension 05/15/2012  . Anemia 05/15/2012  . Hot flushes, perimenopausal 05/15/2012    [facility] 03/02/2015, 5:42 PM  [facility] [address] [place], [place], [address] Phone: [phone]   Fax:  [phone]     Melvenia Needles, PTA 03/02/2015 5:42 PM Phone: [phone] Fax: [phone]

## 2015-03-03 ENCOUNTER — Emergency Department (HOSPITAL_COMMUNITY)
Admission: EM | Admit: 2015-03-03 | Discharge: 2015-03-03 | Disposition: A | Discharge status: Home / Self Care | Payer: No Typology Code available for payment source | Source: Home / Self Care | Attending: Family Medicine | Admitting: Family Medicine

## 2015-03-03 ENCOUNTER — Encounter (HOSPITAL_COMMUNITY): Payer: Self-pay | Admitting: Emergency Medicine

## 2015-03-03 DIAGNOSIS — R102 Pelvic and perineal pain: Secondary | ICD-10-CM

## 2015-03-03 DIAGNOSIS — A499 Bacterial infection, unspecified: Secondary | ICD-10-CM

## 2015-03-03 DIAGNOSIS — N952 Postmenopausal atrophic vaginitis: Secondary | ICD-10-CM

## 2015-03-03 DIAGNOSIS — N760 Acute vaginitis: Secondary | ICD-10-CM

## 2015-03-03 DIAGNOSIS — B9689 Other specified bacterial agents as the cause of diseases classified elsewhere: Secondary | ICD-10-CM

## 2015-03-03 LAB — POCT URINALYSIS DIP (DEVICE)
Bilirubin Urine: NEGATIVE
GLUCOSE, UA: NEGATIVE mg/dL
Hgb urine dipstick: NEGATIVE
Ketones, ur: NEGATIVE mg/dL
Leukocytes, UA: NEGATIVE
NITRITE: NEGATIVE
PH: 6.5 (ref 5.0–8.0)
Protein, ur: >=300 mg/dL — AB
Specific Gravity, Urine: 1.025 (ref 1.005–1.030)
UROBILINOGEN UA: 0.2 mg/dL (ref 0.0–1.0)

## 2015-03-03 MED ORDER — METRONIDAZOLE 500 MG PO TABS: 500.0000 mg | ORAL_TABLET | Freq: Two times a day (BID) | ORAL | Status: DC

## 2015-03-03 NOTE — ED Provider Notes (Signed)
CSN: [id number]     Arrival date & time 03/03/15  1305 History   First MD Initiated Contact with Patient 03/03/15 1354     Chief Complaint  Patient presents with  . Abdominal Pain   (Consider location/radiation/quality/duration/timing/severity/associated sxs/prior Treatment) HPI Comments: 54 year old female while pointing to her mid pelvis is complaining of abdominal pain. She says the pain began approximate 7 days ago and describes it as a sharp pain and often crampy. She denies associated vaginal discharge. Denies upper abdominal pain, vomiting, nausea or diarrhea. Denies fever or chills. Denies dysuria, frequency or urinary urgency. Post menopausal with last menses 2013.   Past Medical History  Diagnosis Date  . Diabetes mellitus   . Hypertension   . Anemia   . Hep C w/o coma, chronic    Past Surgical History  Procedure Laterality Date  . Tubal ligation     Family History  Problem Relation Age of Onset  . Hypertension Mother   . Cancer Mother   . Diabetes Father   . Hypertension Father    Social History  Substance Use Topics  . Smoking status: Never Smoker   . Smokeless tobacco: Never Used  . Alcohol Use: No   OB History    Gravida Para Term Preterm AB TAB SAB Ectopic Multiple Living   3 3 3       3      Review of Systems  Constitutional: Negative for fever, activity change and fatigue.  HENT: Negative.   Respiratory: Negative for cough and shortness of breath.   Cardiovascular: Negative for chest pain and leg swelling.  Gastrointestinal: Negative for nausea, vomiting, abdominal pain and diarrhea.  Genitourinary: Positive for pelvic pain. Negative for dysuria, frequency, hematuria, flank pain, vaginal bleeding, vaginal discharge and menstrual problem.  Musculoskeletal: Negative.   Neurological: Negative.     Allergies  Iodine; Peanut-containing drug products; and Shellfish allergy  Home Medications   Prior to Admission medications   Medication Sig Start  Date End Date Taking? Authorizing Provider  amLODipine (NORVASC) 10 MG tablet Take 10 mg by mouth daily.      Historical Provider, MD  cyclobenzaprine (FLEXERIL) 10 MG tablet Take 1 tablet (10 mg total) by mouth 2 (two) times daily as needed for muscle spasms. 11/23/14   Tatyana Kirichenko, PA-C  gabapentin (NEURONTIN) 100 MG capsule Take 1 capsule by mouth two times daily for one month, then one capsule daily for one month, then discontinue 05/26/14   Lavonia Drafts, MD  HYDROcodone-acetaminophen (NORCO/VICODIN) 5-325 MG per tablet Take 1 tablet by mouth every 6 (six) hours as needed for moderate pain. 03/27/14   Jola Schmidt, MD  Insulin Glargine (LANTUS SOLOSTAR [place]) Inject 31 Units into the skin daily.     Historical Provider, MD  metroNIDAZOLE (FLAGYL) 500 MG tablet Take 1 tablet (500 mg total) by mouth 2 (two) times daily. X 7 days 03/03/15   Janne Napoleon, NP  predniSONE (DELTASONE) 20 MG tablet Take 2 tablets (40 mg total) by mouth daily. 12/25/14   Melony Overly, MD  traMADol (ULTRAM) 50 MG tablet Take 1 tablet (50 mg total) by mouth every 6 (six) hours as needed. 12/25/14   Melony Overly, MD  valsartan-hydrochlorothiazide (DIOVAN-HCT) 320-25 MG per tablet Take 1 tablet by mouth daily.      Historical Provider, MD   BP 147/92 mmHg  Pulse 90  Temp(Src) 98.1 F (36.7 C) (Oral)  Resp 18  SpO2 95%  LMP 06/26/2011 Physical Exam  Constitutional: She  is oriented to person, place, and time. She appears well-developed and well-nourished. No distress.  Neck: Normal range of motion. Neck supple.  Cardiovascular: Normal rate.   Pulmonary/Chest: Effort normal. No respiratory distress.  Genitourinary:  Normal external female genitalia. There is a small to moderate amount of thick white discharge coating the vaginal walls and the cervix. Cervix is somewhat elusive and atrophied. It is position to the right and slightly posterior. The vaginal walls and cervix with pallor. There are few areas to the  ectocervix with erythema. Bimanual mild CMT and bilateral adnexal tenderness.  Neurological: She is alert and oriented to person, place, and time. She exhibits normal muscle tone.  Skin: Skin is warm and dry.  Psychiatric: She has a normal mood and affect.  Nursing note and vitals reviewed.   ED Course  Procedures (including critical care time) Labs Review Labs Reviewed  POCT URINALYSIS DIP (DEVICE) - Abnormal; Notable for the following:    Protein, ur >=300 (*)    All other components within normal limits  CERVICOVAGINAL ANCILLARY ONLY   Results for orders placed or performed during the hospital encounter of 03/03/15  POCT urinalysis dip (device)  Result Value Ref Range   Glucose, UA NEGATIVE NEGATIVE mg/dL   Bilirubin Urine NEGATIVE NEGATIVE   Ketones, ur NEGATIVE NEGATIVE mg/dL   Specific Gravity, Urine 1.025 1.005 - 1.030   Hgb urine dipstick NEGATIVE NEGATIVE   pH 6.5 5.0 - 8.0   Protein, ur >=300 (A) NEGATIVE mg/dL   Urobilinogen, UA 0.2 0.0 - 1.0 mg/dL   Nitrite NEGATIVE NEGATIVE   Leukocytes, UA NEGATIVE NEGATIVE     Imaging Review No results found.   MDM   1. Pelvic pain in female   2. Vaginitis   3. BV (bacterial vaginosis)   4. Vaginal atrophy    Ask your doctor about Estrogen vaginal cream Flagyl as dir Cytology pending     Janne Napoleon, NP 03/03/15 1424

## 2015-03-03 NOTE — ED Notes (Signed)
C/o abd pain States she is bloated States she has brown vaginal discharge. Was having cramps Bowel movements are normal

## 2015-03-03 NOTE — Discharge Instructions (Signed)
Bacterial Vaginosis Bacterial vaginosis is a vaginal infection that occurs when the normal balance of bacteria in the vagina is disrupted. It results from an overgrowth of certain bacteria. This is the most common vaginal infection in women of childbearing age. Treatment is important to prevent complications, especially in pregnant women, as it can cause a premature delivery. CAUSES  Bacterial vaginosis is caused by an increase in harmful bacteria that are normally present in smaller amounts in the vagina. Several different kinds of bacteria can cause bacterial vaginosis. However, the reason that the condition develops is not fully understood. RISK FACTORS Certain activities or behaviors can put you at an increased risk of developing bacterial vaginosis, including:  Having a new sex partner or multiple sex partners.  Douching.  Using an intrauterine device (IUD) for contraception. Women do not get bacterial vaginosis from toilet seats, bedding, swimming pools, or contact with objects around them. SIGNS AND SYMPTOMS  Some women with bacterial vaginosis have no signs or symptoms. Common symptoms include:  Grey vaginal discharge.  A fishlike odor with discharge, especially after sexual intercourse.  Itching or burning of the vagina and vulva.  Burning or pain with urination. DIAGNOSIS  Your health care provider will take a medical history and examine the vagina for signs of bacterial vaginosis. A sample of vaginal fluid may be taken. Your health care provider will look at this sample under a microscope to check for bacteria and abnormal cells. A vaginal pH test may also be done.  TREATMENT  Bacterial vaginosis may be treated with antibiotic medicines. These may be given in the form of a pill or a vaginal cream. A second round of antibiotics may be prescribed if the condition comes back after treatment.  HOME CARE INSTRUCTIONS   Only take over-the-counter or prescription medicines as  directed by your health care provider.  If antibiotic medicine was prescribed, take it as directed. Make sure you finish it even if you start to feel better.  Do not have sex until treatment is completed.  Tell all sexual partners that you have a vaginal infection. They should see their health care provider and be treated if they have problems, such as a mild rash or itching.  Practice safe sex by using condoms and only having one sex partner. SEEK MEDICAL CARE IF:   Your symptoms are not improving after 3 days of treatment.  You have increased discharge or pain.  You have a fever. MAKE SURE YOU:   Understand these instructions.  Will watch your condition.  Will get help right away if you are not doing well or get worse. FOR MORE INFORMATION  Centers for Disease Control and Prevention, Division of STD Prevention: [employer] American Sexual Health Association (ASHA): www.ashastd.org  Document Released: 07/09/2005 Document Revised: 04/29/2013 Document Reviewed: 02/18/2013 [facility] Patient Information 2015 [place], [place]. This information is not intended to replace advice given to you by your health care provider. Make sure you discuss any questions you have with your health care provider.  Pelvic Pain Pelvic pain is pain felt below the belly button and between your hips. It can be caused by many different things. It is important to get help right away. This is especially true for severe, sharp, or unusual pain that comes on suddenly.  HOME CARE  Only take medicine as told by your doctor.  Rest as told by your doctor.  Eat a healthy diet, such as fruits, vegetables, and lean meats.  Drink enough fluids to keep your pee (  urine) clear or pale yellow, or as told.  Avoid sex (intercourse) if it causes pain.  Apply warm or cold packs to your lower belly (abdomen). Use the type of pack that helps the pain.  Avoid situations that cause you stress.  Keep a journal to track  your pain. Write down:  When the pain started.  Where it is located.  If there are things that seem to be related to the pain, such as food or your period.  Follow up with your doctor as told. GET HELP RIGHT AWAY IF:   You have heavy bleeding from the vagina.  You have more pelvic pain.  You feel lightheaded or pass out (faint).  You have chills.  You have pain when you pee or have blood in your pee.  You cannot stop having watery poop (diarrhea).  You cannot stop throwing up (vomiting).  You have a fever or lasting symptoms for more than 3 days.  You have a fever and your symptoms suddenly get worse.  You are being physically or sexually abused.  Your medicine does not help your pain.  You have fluid (discharge) coming from your vagina that is not normal. MAKE SURE YOU:  Understand these instructions.  Will watch your condition.  Will get help if you are not doing well or get worse. Document Released: 12/26/2007 Document Revised: 01/08/2012 Document Reviewed: 10/29/2011 [facility] Patient Information 2015 [place], [place]. This information is not intended to replace advice given to you by your health care provider. Make sure you discuss any questions you have with your health care provider.  Atrophic Vaginitis Ask your doctor about Estrogen vaginal cream Atrophic vaginitis is a problem of low levels of estrogen in women. This problem can happen at any age. It is most common in women who have gone through menopause ("the change").  HOW WILL I KNOW IF I HAVE THIS PROBLEM? You may have:  Trouble with peeing (urinating), such as:  Going to the bathroom often.  A hard time holding your pee until you reach a bathroom.  Leaking pee.  Having pain when you pee.  Itching or a burning feeling.  Vaginal bleeding and spotting.  Pain during sex.  Dryness of the vagina.  A yellow, bad-smelling fluid (discharge) coming from the vagina. HOW WILL MY DOCTOR CHECK FOR  THIS PROBLEM?  During your exam, your doctor will likely find the problem.  If there is a vaginal fluid, it may be checked for infection. HOW WILL THIS PROBLEM BE TREATED? Keep the vulvar skin as clean as possible. Moisturizers and lubricants can help with some of the symptoms. Estrogen replacement can help. There are 2 ways to take estrogen:  Systemic estrogen gets estrogen to your whole body. It takes many weeks or months before the symptoms get better.  You take an estrogen pill.  You use a skin patch. This is a patch that you put on your skin.  If you still have your uterus, your doctor may ask you to take a hormone. Talk to your doctor about the right medicine for you.  Estrogen cream.  This puts estrogen only at the part of your body where you apply it. The cream is put into the vagina or put on the vulvar skin. For some women, estrogen cream works faster than pills or the patch. CAN ALL WOMEN WITH THIS PROBLEM USE ESTROGEN? No. Women with certain types of cancer, liver problems, or problems with blood clots should not take estrogen. Your doctor can  help you decide the best treatment for your symptoms. Document Released: 12/26/2007 Document Revised: 07/14/2013 Document Reviewed: 12/26/2007 [facility] Patient Information 2015 [place], [place]. This information is not intended to replace advice given to you by your health care provider. Make sure you discuss any questions you have with your health care provider.  Vaginitis Vaginitis is an inflammation of the vagina. It is most often caused by a change in the normal balance of the bacteria and yeast that live in the vagina. This change in balance causes an overgrowth of certain bacteria or yeast, which causes the inflammation. There are different types of vaginitis, but the most common types are:  Bacterial vaginosis.  Yeast infection (candidiasis).  Trichomoniasis vaginitis. This is a sexually transmitted infection (STI).  Viral  vaginitis.  Atropic vaginitis.  Allergic vaginitis. CAUSES  The cause depends on the type of vaginitis. Vaginitis can be caused by:  Bacteria (bacterial vaginosis).  Yeast (yeast infection).  A parasite (trichomoniasis vaginitis)  A virus (viral vaginitis).  Low hormone levels (atrophic vaginitis). Low hormone levels can occur during pregnancy, breastfeeding, or after menopause.  Irritants, such as bubble baths, scented tampons, and feminine sprays (allergic vaginitis). Other factors can change the normal balance of the yeast and bacteria that live in the vagina. These include:  Antibiotic medicines.  Poor hygiene.  Diaphragms, vaginal sponges, spermicides, birth control pills, and intrauterine devices (IUD).  Sexual intercourse.  Infection.  Uncontrolled diabetes.  A weakened immune system. SYMPTOMS  Symptoms can vary depending on the cause of the vaginitis. Common symptoms include:  Abnormal vaginal discharge.  The discharge is white, gray, or yellow with bacterial vaginosis.  The discharge is thick, white, and cheesy with a yeast infection.  The discharge is frothy and yellow or greenish with trichomoniasis.  A bad vaginal odor.  The odor is fishy with bacterial vaginosis.  Vaginal itching, pain, or swelling.  Painful intercourse.  Pain or burning when urinating. Sometimes, there are no symptoms. TREATMENT  Treatment will vary depending on the type of infection.   Bacterial vaginosis and trichomoniasis are often treated with antibiotic creams or pills.  Yeast infections are often treated with antifungal medicines, such as vaginal creams or suppositories.  Viral vaginitis has no cure, but symptoms can be treated with medicines that relieve discomfort. Your sexual partner should be treated as well.  Atrophic vaginitis may be treated with an estrogen cream, pill, suppository, or vaginal ring. If vaginal dryness occurs, lubricants and moisturizing creams  may help. You may be told to avoid scented soaps, sprays, or douches.  Allergic vaginitis treatment involves quitting the use of the product that is causing the problem. Vaginal creams can be used to treat the symptoms. HOME CARE INSTRUCTIONS   Take all medicines as directed by your caregiver.  Keep your genital area clean and dry. Avoid soap and only rinse the area with water.  Avoid douching. It can remove the healthy bacteria in the vagina.  Do not use tampons or have sexual intercourse until your vaginitis has been treated. Use sanitary pads while you have vaginitis.  Wipe from front to back. This avoids the spread of bacteria from the rectum to the vagina.  Let air reach your genital area.  Wear cotton underwear to decrease moisture buildup.  Avoid wearing underwear while you sleep until your vaginitis is gone.  Avoid tight pants and underwear or nylons without a cotton panel.  Take off wet clothing (especially bathing suits) as soon as possible.  Use mild,  non-scented products. Avoid using irritants, such as:  Scented feminine sprays.  Fabric softeners.  Scented detergents.  Scented tampons.  Scented soaps or bubble baths.  Practice safe sex and use condoms. Condoms may prevent the spread of trichomoniasis and viral vaginitis. SEEK MEDICAL CARE IF:   You have abdominal pain.  You have a fever or persistent symptoms for more than 2-3 days.  You have a fever and your symptoms suddenly get worse. Document Released: 05/06/2007 Document Revised: 04/02/2012 Document Reviewed: 12/20/2011 [facility] Patient Information 2015 [place], [place]. This information is not intended to replace advice given to you by your health care provider. Make sure you discuss any questions you have with your health care provider.

## 2015-03-04 ENCOUNTER — Encounter: Payer: No Typology Code available for payment source | Admitting: Rehabilitative and Restorative Service Providers"

## 2015-03-04 LAB — CERVICOVAGINAL ANCILLARY ONLY
Chlamydia: NEGATIVE
Neisseria Gonorrhea: NEGATIVE
Wet Prep (BD Affirm): NEGATIVE

## 2015-03-04 NOTE — ED Notes (Signed)
Final report negative for GC, chlamydia, gardnerella, yeast, trichomonas

## 2015-03-07 ENCOUNTER — Encounter: Payer: No Typology Code available for payment source | Admitting: Physical Therapy

## 2015-03-10 ENCOUNTER — Encounter: Payer: No Typology Code available for payment source | Admitting: Physical Therapy

## 2015-03-10 ENCOUNTER — Ambulatory Visit: Payer: No Typology Code available for payment source | Admitting: Physical Therapy

## 2015-03-10 DIAGNOSIS — M6281 Muscle weakness (generalized): Secondary | ICD-10-CM

## 2015-03-10 DIAGNOSIS — M5090 Cervical disc disorder, unspecified, unspecified cervical region: Secondary | ICD-10-CM

## 2015-03-10 DIAGNOSIS — M436 Torticollis: Secondary | ICD-10-CM

## 2015-03-10 NOTE — Therapy (Signed)
[place], [place], [address] Phone: [phone]   Fax:  [phone]  Physical Therapy Treatment / Renewal   Patient Details  Name: Maureen Barnes MRN: [id number] Date of Birth: Jun 25, 1961 Referring Provider:  Wenda Low, MD  Encounter Date: 03/10/2015      PT End of Session - 03/10/15 1712    Visit Number 10   Number of Visits 14   Date for PT Re-Evaluation 04/07/15   Authorization Type GCC[employer] 100% renewed NEED NEW CARD SCANNED   PT Start Time [id number]   PT Stop Time 1500   PT Time Calculation (min) 45 min   Activity Tolerance Patient tolerated treatment well   Behavior During Therapy [facility] for tasks assessed/performed      Past Medical History  Diagnosis Date  . Diabetes mellitus   . Hypertension   . Anemia   . Hep C w/o coma, chronic     Past Surgical History  Procedure Laterality Date  . Tubal ligation      There were no vitals filed for this visit.  Visit Diagnosis:  Stiffness of cervical spine - Plan: PT plan of care cert/re-cert  Muscle weakness - Plan: PT plan of care cert/re-cert  Cervical disc disease - Plan: PT plan of care cert/re-cert      Subjective Assessment - 03/10/15 1425    Subjective "somedays I am doing well, other days I am doing worse it greatly depends on if I am taking a muscle relaxer/ pain medication.   Currently in Pain? Yes   Pain Score 5    Pain Location Neck   Pain Orientation Left;Mid;Posterior   Pain Descriptors / Indicators Aching   Pain Type Chronic pain   Pain Onset More than a month ago   Pain Frequency Intermittent   Aggravating Factors  reading, computer            [facility] PT Assessment - 03/10/15 0001    Assessment   Medical Diagnosis cervical disc disease   Onset Date/Surgical Date 11/15/14   Hand Dominance Right   Next MD Visit August   Prior Therapy had PT for back here last summer "went good" exercises for the back but then  things went back to the way they were   Precautions   Precautions None   Restrictions   Weight Bearing Restrictions No   Balance Screen   Has the patient fallen in the past 6 months No   Has the patient had a decrease in activity level because of a fear of falling?  Yes   Is the patient reluctant to leave their home because of a fear of falling?  Yes   [place] Private residence   Living Arrangements Children   Type of [place] Access Stairs to enter   Entrance Stairs-Number of Steps flight   Prior Function   Level of Independence Independent with basic ADLs   AROM   Right Shoulder Flexion 140 Degrees   Right Shoulder ABduction 148 Degrees   Left Shoulder Flexion 142 Degrees   Left Shoulder ABduction 138 Degrees   Cervical Flexion 28   Cervical Extension 44   Cervical - Right Side Bend 42   Cervical - Left Side Bend 38   Cervical - Right Rotation 36   Cervical - Left Rotation 40   Strength   Right Shoulder Flexion 3+/5   Right Shoulder Extension 3+/5   Right Shoulder ABduction  3+/5   Left Shoulder Flexion 3+/5   Left Shoulder Extension 3+/5   Left Shoulder ABduction 3+/5   Cervical Flexion 3/5   Cervical Extension 3/5                     [facility] Adult PT Treatment/Exercise - 03/10/15 1427    Self-Care   Self-Care Posture;Other Self-Care Comments   Posture proper posture to avoid hiking of the shoullder inorder avoid over use of the upper traps   Other Self-Care Comments  education regarding trigger points and causes and benefits with trigger point release activities.    Neck Exercises: Machines for Strengthening   UBE (Upper Arm Bike) L1 x 4 min   Neck Exercises: Seated   Neck Retraction 15 reps;5 secs  multi-angle isometrics 5 ea   Shoulder Exercises: Supine   External Rotation AROM;Strengthening;Both;10 reps   Theraband Level (Shoulder External Rotation) Level 1 (Yellow)   Flexion AROM;Strengthening;Both;10 reps    Theraband Level (Shoulder Flexion) Level 1 (Yellow)   Other Supine Exercises supine scapular stabilization6 different   Moist Heat Therapy   Number Minutes Moist Heat 15 Minutes   Moist Heat Location Cervical   Manual Therapy   Joint Mobilization cervical and thoracic mobs grade 3 P>A    Soft tissue mobilization trigger point rlease of levator, upper trap                PT Education - 03/10/15 1457    Education provided Yes   Education Details trigger point education, and dry needling education   Person(s) Educated Patient   Methods Explanation   Comprehension Verbalized understanding          PT Short Term Goals - 03/10/15 1717    PT SHORT TERM GOAL #1   Title "Demonstrate understanding of proper sitting posture, body mechanics for home chores and be more conscious of position and posture throughout the day.    Time 4   Period Weeks   Status On-going   PT SHORT TERM GOAL #2   Title Cervical rotation bil improved to 45 degrees needed for driving   Time 4   Period Weeks   Status Revised   PT SHORT TERM GOAL #3   Title Pt will increase cervical flexion to 45 degrees to assist with reading   Baseline 17 degrees   Time 4   Period Weeks   Status On-going           PT Long Term Goals - 03/10/15 1717    PT LONG TERM GOAL #1   Title "Demonstrate and verbalize techniques to reduce the risk of re-injury including: lifting, posture, body mechanics   Time 8   Period Weeks   Status On-going   PT LONG TERM GOAL #2   Title Cervical flexion, ext, lateral flexion ROM improved to 45-50 degrees needed for household chores.   Baseline 5 degrees   Time 8   Period Weeks   Status On-going   PT LONG TERM GOAL #3   Title Bilateral shoulder flexion/abduction improved to 158 degrees needed for combing hair and other grooming/dressing tasks.   Time 8   Period Weeks   Status On-going   PT LONG TERM GOAL #4   Title Patient will be independent in a progressive ROM and  strengthening home ex program needed to prevent a further decline in function   Time 8   Period Weeks   Status On-going   PT LONG TERM GOAL #5  Title Neck Disability Index with a moderate self perceived disability rather than severe.(< or= 48%)   Time 8   Period Weeks   Status On-going               Plan - 03/10/15 1714    Clinical Impression Statement Olivia Mackie continues to demonstrate pain in the neck and bil upper traps limited cervical mobility as well as shoulder AROM and strength. Educated the benefits of good posture and what trigger points were and causes as well as treatment techniques including dry needling which pt reported she understood  much better. She reported  mild reduction in pain following todays visit. Plan to continue with current POC to transition to independent exercises.    Pt will benefit from skilled therapeutic intervention in order to improve on the following deficits Pain;Hypomobility;Decreased strength;Increased muscle spasms;Decreased range of motion   Rehab Potential Good   PT Frequency 1x / week   PT Duration 3 weeks   PT Treatment/Interventions ADLs/Self Care Home Management;Electrical Stimulation;Cryotherapy;Moist Heat;Ultrasound;Therapeutic exercise;Patient/family education;Manual techniques;Dry needling;Taping   PT Next Visit Plan Review Bands supine for scapula, posture education/ control, modalities PRN   PT Home Exercise Plan HEP review   Consulted and Agree with Plan of Care Patient        Problem List Patient Active Problem List   Diagnosis Date Noted  . Hepatitis C, chronic 05/15/2012  . Diabetes mellitus type 2, insulin dependent 05/15/2012  . Hypertension 05/15/2012  . Anemia 05/15/2012  . Hot flushes, perimenopausal 05/15/2012   Starr [place] PT, DPT, LAT, ATC  03/10/2015  5:21 PM    [facility] [address] [place], [place], [address] Phone: [phone]   Fax:   [phone]

## 2015-03-14 ENCOUNTER — Encounter: Payer: No Typology Code available for payment source | Admitting: Physical Therapy

## 2015-03-16 ENCOUNTER — Ambulatory Visit: Payer: No Typology Code available for payment source | Admitting: Physical Therapy

## 2015-03-16 DIAGNOSIS — M436 Torticollis: Secondary | ICD-10-CM

## 2015-03-16 DIAGNOSIS — M6281 Muscle weakness (generalized): Secondary | ICD-10-CM

## 2015-03-16 NOTE — Therapy (Signed)
[place], [place], [address] Phone: [phone]   Fax:  [phone]  Physical Therapy Treatment  Patient Details  Name: Maureen Barnes MRN: [id number] Date of Birth: 1961-01-21 Referring Provider:  Wenda Low, MD  Encounter Date: 03/16/2015      PT End of Session - 03/16/15 1519    Visit Number 11   Number of Visits 14   Date for PT Re-Evaluation 04/07/15   PT Start Time [id number]   PT Stop Time 1525   PT Time Calculation (min) 65 min   Activity Tolerance Patient tolerated treatment well   Behavior During Therapy [facility] for tasks assessed/performed      Past Medical History  Diagnosis Date  . Diabetes mellitus   . Hypertension   . Anemia   . Hep C w/o coma, chronic     Past Surgical History  Procedure Laterality Date  . Tubal ligation      There were no vitals filed for this visit.  Visit Diagnosis:  Stiffness of cervical spine  Muscle weakness      Subjective Assessment - 03/16/15 1421    Subjective 4/10.   PT really helps pain a lot better.  Especially the following day.   Currently in Pain? Yes   Pain Score 4    Pain Location Neck   Pain Orientation Left;Mid;Posterior   Pain Descriptors / Indicators --  stiffen.  it tries to get stuck   Pain Radiating Towards shoulders   Aggravating Factors  reading, computer   Pain Relieving Factors PT                         [facility] Adult PT Treatment/Exercise - 03/16/15 1430    Neck Exercises: Seated   Neck Retraction --  3 reps 5 seconds combined with shoudler retration and depres   Shoulder Exercises: Supine   Other Supine Exercises supine scapular stabilization.  yellow band 10 reps each Of   Narrow and wide grip shoulder flexion,ER,  Horizontal pulls.,   Land IFC   Electrical Stimulation Parameters 6   Electrical  Stimulation Goals Pain   Manual Therapy   Manual Therapy Soft tissue mobilization   Joint Mobilization cervical, upper traps   Soft tissue mobilization trigger point rlease of levator, upper trap                  PT Short Term Goals - 03/16/15 1523    PT SHORT TERM GOAL #1   Title "Demonstrate understanding of proper sitting posture, body mechanics for home chores and be more conscious of position and posture throughout the day.    Baseline needed cues today with sitting   Time 4   Period Weeks   Status On-going   PT SHORT TERM GOAL #2   Title Cervical rotation bil improved to 45 degrees needed for driving   Baseline not measured   Time 4   Period Weeks   PT SHORT TERM GOAL #3   Title Pt will increase cervical flexion to 45 degrees to assist with reading   Baseline not measured   Time 4   Period Weeks   Status On-going           PT Long Term Goals - 03/10/15 1717    PT LONG TERM GOAL #1   Title "Demonstrate and verbalize techniques to reduce the risk  of re-injury including: lifting, posture, body mechanics   Time 8   Period Weeks   Status On-going   PT LONG TERM GOAL #2   Title Cervical flexion, ext, lateral flexion ROM improved to 45-50 degrees needed for household chores.   Baseline 5 degrees   Time 8   Period Weeks   Status On-going   PT LONG TERM GOAL #3   Title Bilateral shoulder flexion/abduction improved to 158 degrees needed for combing hair and other grooming/dressing tasks.   Time 8   Period Weeks   Status On-going   PT LONG TERM GOAL #4   Title Patient will be independent in a progressive ROM and strengthening home ex program needed to prevent a further decline in function   Time 8   Period Weeks   Status On-going   PT LONG TERM GOAL #5   Title Neck Disability Index with a moderate self perceived disability rather than severe.(< or= 48%)   Time 8   Period Weeks   Status On-going               Plan - 03/16/15 1522    Clinical  Impression Statement mild shoulder pain Lt post exercises/  pain graduallt improving.     PT Next Visit Plan Review Bands supine for scapula, posture education/ control, modalities PRN   PT Home Exercise Plan Scapular band   Consulted and Agree with Plan of Care Patient        Problem List Patient Active Problem List   Diagnosis Date Noted  . Hepatitis C, chronic 05/15/2012  . Diabetes mellitus type 2, insulin dependent 05/15/2012  . Hypertension 05/15/2012  . Anemia 05/15/2012  . Hot flushes, perimenopausal 05/15/2012    [facility] 03/16/2015, 3:24 PM  [facility] [address] [place], [place], [address] Phone: [phone]   Fax:  [phone]     Melvenia Needles, PTA 03/16/2015 3:24 PM Phone: [phone] Fax: [phone]

## 2015-03-21 ENCOUNTER — Encounter: Payer: No Typology Code available for payment source | Admitting: Physical Therapy

## 2015-03-23 ENCOUNTER — Ambulatory Visit: Payer: No Typology Code available for payment source | Admitting: Physical Therapy

## 2015-03-23 DIAGNOSIS — M436 Torticollis: Secondary | ICD-10-CM

## 2015-03-23 DIAGNOSIS — M5090 Cervical disc disorder, unspecified, unspecified cervical region: Secondary | ICD-10-CM

## 2015-03-23 DIAGNOSIS — M6281 Muscle weakness (generalized): Secondary | ICD-10-CM

## 2015-03-23 NOTE — Patient Instructions (Signed)
EXTENSION: Standing - Resistance Band: Stable (Active)   Stand, right arm at side. Against yellow resistance band, draw arm backward to hips, keeping elbow straight. Complete __2_ sets of _10__ repetitions. Perform _2__ sessions per day.   Copyright  VHI. All rights reserved.  Resistive Band Rowing   With resistive band anchored in door, grasp both ends. Keeping elbows bent, pull back, squeezing shoulder blades together. Hold ___5_ seconds. Repeat _10___ times. Repeat 2 sets. Do _2___ sessions per day.  http://gt2.exer.us/97   Copyright  VHI. All rights reserved.

## 2015-03-23 NOTE — Therapy (Signed)
[place], [place], [address] Phone: [phone]   Fax:  [phone]  Physical Therapy Treatment  Patient Details  Name: Maureen Barnes MRN: [id number] Date of Birth: Jan 28, 1961 Referring Provider:  Wenda Low, MD  Encounter Date: 03/23/2015      PT End of Session - 03/23/15 1605    Visit Number 12   Number of Visits 14   Date for PT Re-Evaluation 04/07/15   Authorization Type GCCN 100% renewed NEED NEW CARD SCANNED   PT Start Time 0307   PT Stop Time 0400   PT Time Calculation (min) 53 min      Past Medical History  Diagnosis Date  . Diabetes mellitus   . Hypertension   . Anemia   . Hep C w/o coma, chronic     Past Surgical History  Procedure Laterality Date  . Tubal ligation      There were no vitals filed for this visit.  Visit Diagnosis:  Stiffness of cervical spine  Muscle weakness  Cervical disc disease      Subjective Assessment - 03/23/15 1510    Subjective Neck locks with sudden neck movements. Stiff in the morning and sometimes throbbs on right side of neck throughout the day.    Currently in Pain? Yes   Pain Score 4    Pain Location Neck   Pain Orientation Left;Mid;Posterior   Pain Descriptors / Indicators Aching   Aggravating Factors  reading, computer   Pain Relieving Factors PT            [facility] PT Assessment - 03/23/15 1533    AROM   Cervical Flexion 30   Cervical Extension 45   Cervical - Right Side Bend 30   Cervical - Left Side Bend 25   Cervical - Right Rotation 60   Cervical - Left Rotation 70                     [facility] Adult PT Treatment/Exercise - 03/23/15 1516    Self-Care   Self-Care Posture;Other Self-Care Comments  discussed seated posture while reading; resolution adjust   Neck Exercises: Seated   Other Seated Exercise Scap stab exercises progressed to sitting x 10 each focus on neutral spine/chin tuck throughout   Shoulder Exercises: Standing   Extension 12 reps;Theraband   Theraband Level (Shoulder Extension) Level 2 (Red)   Row 15 reps;Theraband   Theraband Level (Shoulder Row) Level 2 (Red)   Moist Heat Therapy   Number Minutes Moist Heat 15 Minutes   Moist Heat Location Cervical   Electrical Stimulation   Electrical Stimulation Location cervical   Electrical Stimulation Action IFC   Electrical Stimulation Parameters 5   Electrical Stimulation Goals Pain                  PT Short Term Goals - 03/23/15 1534    PT SHORT TERM GOAL #1   Title "Demonstrate understanding of proper sitting posture, body mechanics for home chores and be more conscious of position and posture throughout the day.    Time 4   Period Weeks   Status Achieved   PT SHORT TERM GOAL #2   Title Cervical rotation bil improved to 45 degrees needed for driving   Time 4   Period Weeks   Status Achieved   PT SHORT TERM GOAL #3   Title Pt will increase cervical flexion to 45 degrees to assist with reading   Time  4   Period Weeks   Status On-going           PT Long Term Goals - 03/23/15 1538    PT LONG TERM GOAL #1   Title "Demonstrate and verbalize techniques to reduce the risk of re-injury including: lifting, posture, body mechanics   Time 8   Period Weeks   Status On-going   PT LONG TERM GOAL #2   Title Cervical flexion, ext, lateral flexion ROM improved to 45-50 degrees needed for household chores.   Time 8   Period Weeks   Status Partially Met   PT LONG TERM GOAL #3   Title Bilateral shoulder flexion/abduction improved to 158 degrees needed for combing hair and other grooming/dressing tasks.   Time 8   Period Weeks   Status On-going   PT LONG TERM GOAL #4   Title Patient will be independent in a progressive ROM and strengthening home ex program needed to prevent a further decline in function   Time 8   Period Weeks   Status On-going   PT LONG TERM GOAL #5   Title Neck Disability Index with  a moderate self perceived disability rather than severe.(< or= 48%)   Time 8   Period Weeks   Status On-going               Plan - 03/23/15 1600    Clinical Impression Statement Cervical AROM improving. STG# 1,2 MET. Instructed pt in current HEP in sitting to progress shoulder and scap strengtheing. Pt tolerated well. Instructed pt in standing rows and extension with red band and issued for HEP.     PT Next Visit Plan Review new HEP, perform scap stab in sitting and standing. modalities/ manual as needed., shoulder strengthening        Problem List Patient Active Problem List   Diagnosis Date Noted  . Hepatitis C, chronic 05/15/2012  . Diabetes mellitus type 2, insulin dependent 05/15/2012  . Hypertension 05/15/2012  . Anemia 05/15/2012  . Hot flushes, perimenopausal 05/15/2012    Dorene Ar, PTA 03/23/2015, 4:07 PM  [facility] [address] [place], [place], [address] Phone: [phone]   Fax:  [phone]

## 2015-03-30 ENCOUNTER — Ambulatory Visit: Payer: No Typology Code available for payment source | Attending: Internal Medicine | Admitting: Physical Therapy

## 2015-03-30 DIAGNOSIS — M436 Torticollis: Secondary | ICD-10-CM | POA: Insufficient documentation

## 2015-03-30 DIAGNOSIS — M6281 Muscle weakness (generalized): Secondary | ICD-10-CM

## 2015-03-30 DIAGNOSIS — M5090 Cervical disc disorder, unspecified, unspecified cervical region: Secondary | ICD-10-CM | POA: Insufficient documentation

## 2015-03-30 NOTE — Therapy (Signed)
[place], [place], [address] Phone: [phone]   Fax:  [phone]  Physical Therapy Treatment  Patient Details  Name: Maureen Barnes MRN: [id number] Date of Birth: [birth year] Referring Provider:  Wenda Low, MD  Encounter Date: 03/30/2015      PT End of Session - 03/30/15 1742    Visit Number 13   Number of Visits 14   Date for PT Re-Evaluation 04/07/15   PT Start Time [id number]   PT Stop Time 1647   PT Time Calculation (min) 60 min   Activity Tolerance Patient tolerated treatment well   Behavior During Therapy [facility] for tasks assessed/performed      Past Medical History  Diagnosis Date  . Diabetes mellitus   . Hypertension   . Anemia   . Hep C w/o coma, chronic     Past Surgical History  Procedure Laterality Date  . Tubal ligation      There were no vitals filed for this visit.  Visit Diagnosis:  Stiffness of cervical spine  Muscle weakness      Subjective Assessment - 03/30/15 1548    Subjective Has been doing her home exercises. 3/10  Lt neck stif pain.  Better with massage   Currently in Pain? Yes   Pain Score 3    Pain Location Neck   Pain Orientation Right;Left;Posterior   Pain Descriptors / Indicators Aching   Pain Type Chronic pain   Pain Radiating Towards uppertraps   Pain Frequency Intermittent   Aggravating Factors  reading and working at computer, sudden movements, cold air,   Pain Relieving Factors scarf around neck                         [facility] Adult PT Treatment/Exercise - 03/30/15 1547    Self-Care   Posture posture/shoulder education.  anatomy   Neck Exercises: Machines for Strengthening   UBE (Upper Arm Bike) L1.5  3 minutes each direction   Shoulder Exercises: Seated   Retraction 10 reps  warmup 10 x   Row 10 reps   Theraband Level (Shoulder Row) Level 2 (Red)   Row Limitations Both    External Rotation 10 reps   Theraband Level  (Shoulder External Rotation) Level 2 (Red)   External Rotation Limitations single and double arms    Internal Rotation Strengthening;Both;10 reps;Theraband   Theraband Level (Shoulder Internal Rotation) Level 2 (Red)   Shoulder Exercises: Standing   Extension 10 reps   Theraband Level (Shoulder Extension) Level 2 (Red)   Row 15 reps;Theraband   Theraband Level (Shoulder Row) Level 2 (Red)   Moist Heat Therapy   Number Minutes Moist Heat 15 Minutes   Moist Heat Location Cervical                PT Education - 03/30/15 1741    Education provided Yes   Education Details shoulder/posture education and anatomy, importance   Person(s) Educated Patient   Methods Explanation;Demonstration   Comprehension Verbalized understanding;Returned demonstration          PT Short Term Goals - 03/30/15 1746    PT SHORT TERM GOAL #1   Title "Demonstrate understanding of proper sitting posture, body mechanics for home chores and be more conscious of position and posture throughout the day.    Status Achieved   PT SHORT TERM GOAL #2   Title Cervical rotation bil improved to 45 degrees needed for driving  Status Achieved   PT SHORT TERM GOAL #3   Title Pt will increase cervical flexion to 45 degrees to assist with reading   Time 4   Period Weeks   Status Unable to assess           PT Long Term Goals - 03/30/15 1748    PT LONG TERM GOAL #1   Title "Demonstrate and verbalize techniques to reduce the risk of re-injury including: lifting, posture, body mechanics   Baseline posture sitting, demonstrates , not yet a habit   Time 8   Period Weeks   Status Partially Met   PT LONG TERM GOAL #2   Title Cervical flexion, ext, lateral flexion ROM improved to 45-50 degrees needed for household chores.   Time 8   Period Weeks   Status Unable to assess   PT LONG TERM GOAL #3   Title Bilateral shoulder flexion/abduction improved to 158 degrees needed for combing hair and other  grooming/dressing tasks.   Time 8   Period Weeks   Status On-going   PT LONG TERM GOAL #4   Title Patient will be independent in a progressive ROM and strengthening home ex program needed to prevent a further decline in function   Baseline independent with those issued sofar   Time 8   Period Weeks   Status On-going   PT LONG TERM GOAL #5   Time 8   Period Weeks   Status Unable to assess               Plan - 03/30/15 1747    PT Next Visit Plan , perform scap stab in sitting and standing. modalities/ manual as needed., shoulder strengthening.  Practice lifting   PT Home Exercise Plan work on posture often   Consulted and Agree with Plan of Care Patient     POC 04/07/15    Problem List Patient Active Problem List   Diagnosis Date Noted  . Hepatitis C, chronic 05/15/2012  . Diabetes mellitus type 2, insulin dependent 05/15/2012  . Hypertension 05/15/2012  . Anemia 05/15/2012  . Hot flushes, perimenopausal 05/15/2012    [facility] 03/30/2015, 5:50 PM  [facility] [address] [place], [place], [address] Phone: [phone]   Fax:  [phone]     Melvenia Needles, PTA 03/30/2015 5:50 PM Phone: [phone] Fax: [phone]

## 2015-04-04 ENCOUNTER — Ambulatory Visit: Payer: No Typology Code available for payment source | Admitting: Physical Therapy

## 2015-04-04 DIAGNOSIS — M5090 Cervical disc disorder, unspecified, unspecified cervical region: Secondary | ICD-10-CM

## 2015-04-04 DIAGNOSIS — M6281 Muscle weakness (generalized): Secondary | ICD-10-CM

## 2015-04-04 DIAGNOSIS — M436 Torticollis: Secondary | ICD-10-CM

## 2015-04-04 NOTE — Therapy (Addendum)
[place] [employer], [place], [address] Phone: [phone]   Fax:  [phone]  Physical Therapy Treatment/Discharge summary  Patient Details  Name: Maureen Barnes MRN: [id number] Date of Birth: Dec 17, 1960 Referring Provider:  Wenda Low, MD  Encounter Date: 04/04/2015      PT End of Session - 04/04/15 1108    Visit Number 14   Number of Visits 14   Date for PT Re-Evaluation 04/07/15   Authorization Type GCCN 100% renewed NEED NEW CARD SCANNED   PT Start Time 1105   Activity Tolerance Patient tolerated treatment well   Behavior During Therapy [facility] for tasks assessed/performed      Past Medical History  Diagnosis Date  . Diabetes mellitus   . Hypertension   . Anemia   . Hep C w/o coma, chronic     Past Surgical History  Procedure Laterality Date  . Tubal ligation      There were no vitals filed for this visit.  Visit Diagnosis:  Stiffness of cervical spine  Muscle weakness  Cervical disc disease      Subjective Assessment - 04/04/15 1110    Subjective in chronic pain always.    Pertinent History DM, Hep C   Limitations House hold activities;Sitting   How long can you sit comfortably? 15-30 min   How long can you stand comfortably? 15-20 min   How long can you walk comfortably? 10-15 min   Diagnostic tests x-ray of neck    Patient Stated Goals get better, feel better, get rid of the pain   Currently in Pain? Yes   Pain Score 4    Pain Location Neck   Pain Orientation Left   Pain Descriptors / Indicators Sharp   Pain Onset More than a month ago   Pain Frequency Constant   Aggravating Factors  reading and working at computer.   Pain Relieving Factors meds, lumber support   Multiple Pain Sites No                                 PT Education - 04/04/15 1156    Education provided Yes   Education Details Posture in sleeping/sitting at computer/ reading/ cell  phone/ walking/ standing   Person(s) Educated Patient   Methods Explanation;Demonstration   Comprehension Verbalized understanding          PT Short Term Goals - 04/04/15 1143    PT SHORT TERM GOAL #3   Title Pt will increase cervical flexion to 45 degrees to assist with reading   Baseline not measured   Time 0   Period Weeks   Status Not Met           PT Long Term Goals - 04/04/15 1143    PT LONG TERM GOAL #1   Title "Demonstrate and verbalize techniques to reduce the risk of re-injury including: lifting, posture, body mechanics   Baseline posture sitting, demonstrates , not yet a habit   Period Weeks   Status Achieved   PT LONG TERM GOAL #2   Title Cervical flexion, ext, lateral flexion ROM improved to 45-50 degrees needed for household chores.   Baseline 5 degrees   Period Weeks   Status On-going   PT LONG TERM GOAL #3   Baseline see flow sheet   Period Weeks   Status On-going   PT LONG TERM GOAL #4   Title Patient will  be independent in a progressive ROM and strengthening home ex program needed to prevent a further decline in function   Baseline independent with those issued sofar   Time 8   Period Weeks   Status Achieved   PT LONG TERM GOAL #5   Title Neck Disability Index with a moderate self perceived disability rather than severe.(< or= 48%)   Baseline 56% today   Time 8   Period Weeks   Status Not Met               Plan - 04/04/15 1154    Clinical Impression Statement pt was surprised she is D/C today but understands that she must continue to work on posture and ADL's. She was presenting with Percing Posture sitting. She is still using 2+ pillows during sleep. She will up her HEP and begin to decrease pillows under head at night gradually. She will be more aware of sitting posture at work and make nesseccary changes.    Pt will benefit from skilled therapeutic intervention in order to improve on the following deficits Pain;Hypomobility;Decreased  strength;Increased muscle spasms;Decreased range of motion   Rehab Potential Good   Clinical Impairments Affecting Rehab Potential 3 risk factors for  developing adhesive capsulitis   PT Frequency 1x / week   PT Duration 3 weeks   PT Treatment/Interventions ADLs/Self Care Home Management;Electrical Stimulation;Cryotherapy;Moist Heat;Ultrasound;Therapeutic exercise;Patient/family education;Manual techniques;Dry needling;Taping   PT Next Visit Plan , perform scap stab in sitting and standing. modalities/ manual as needed., shoulder strengthening.  Practice lifting   PT Home Exercise Plan work on posture often   Consulted and Agree with Plan of Care Patient        Problem List Patient Active Problem List   Diagnosis Date Noted  . Hepatitis C, chronic 05/15/2012  . Diabetes mellitus type 2, insulin dependent 05/15/2012  . Hypertension 05/15/2012  . Anemia 05/15/2012  . Hot flushes, perimenopausal 05/15/2012     Natividad Brood, PTA  04/05/2015, 10:04 AM  [facility] [address] [place], [place], [address] Phone: [phone]   Fax:  [phone]  PHYSICAL THERAPY DISCHARGE SUMMARY  Visits from Start of Care: 14  Current functional level related to goals / functional outcomes: The patient made partial progress with PT over 14 visits.  Continues to have pain and moderate limitations with Neck Disability Index at 56%.  Her progress has plateaued at this time.  Recommend discharge from PT.   Remaining deficits: See above   Education / Equipment: HEP, postural education Plan: Patient agrees to discharge.  Patient goals were partially met. Patient is being discharged due to lack of progress.  ?????  Ruben Im, PT 04/05/2015 10:07 AM Phone: [phone] Fax: [phone]

## 2015-04-11 ENCOUNTER — Encounter: Payer: No Typology Code available for payment source | Admitting: Physical Therapy

## 2015-04-13 ENCOUNTER — Encounter: Payer: No Typology Code available for payment source | Admitting: Physical Therapy

## 2015-04-18 ENCOUNTER — Encounter: Payer: No Typology Code available for payment source | Admitting: Physical Therapy

## 2015-04-20 ENCOUNTER — Encounter: Payer: No Typology Code available for payment source | Admitting: Physical Therapy

## 2015-04-26 ENCOUNTER — Encounter: Payer: No Typology Code available for payment source | Admitting: Physical Therapy

## 2015-04-28 ENCOUNTER — Encounter: Payer: No Typology Code available for payment source | Admitting: Physical Therapy

## 2015-05-09 ENCOUNTER — Other Ambulatory Visit: Payer: Self-pay | Admitting: Nurse Practitioner

## 2015-05-09 ENCOUNTER — Ambulatory Visit
Admission: RE | Admit: 2015-05-09 | Discharge: 2015-05-09 | Disposition: A | Discharge status: Home / Self Care | Payer: No Typology Code available for payment source | Source: Ambulatory Visit | Attending: Nurse Practitioner | Admitting: Nurse Practitioner

## 2015-05-09 DIAGNOSIS — W19XXXA Unspecified fall, initial encounter: Secondary | ICD-10-CM

## 2015-09-30 ENCOUNTER — Encounter (HOSPITAL_COMMUNITY): Payer: Self-pay

## 2015-09-30 ENCOUNTER — Emergency Department (HOSPITAL_COMMUNITY)
Admission: EM | Admit: 2015-09-30 | Discharge: 2015-09-30 | Disposition: A | Discharge status: Home / Self Care | Payer: No Typology Code available for payment source | Source: Home / Self Care | Attending: Emergency Medicine | Admitting: Emergency Medicine

## 2015-09-30 DIAGNOSIS — S39011A Strain of muscle, fascia and tendon of abdomen, initial encounter: Secondary | ICD-10-CM

## 2015-09-30 DIAGNOSIS — S76219A Strain of adductor muscle, fascia and tendon of unspecified thigh, initial encounter: Secondary | ICD-10-CM

## 2015-09-30 DIAGNOSIS — R1031 Right lower quadrant pain: Secondary | ICD-10-CM

## 2015-09-30 NOTE — Discharge Instructions (Signed)
Groin Strain A groin strain (also called a groin pull) is an injury to the muscles or tendon on the upper inner part of the thigh. These muscles are called the adductor muscles or groin muscles. They are responsible for moving the leg across the body. A muscle strain occurs when a muscle is overstretched and some muscle fibers are torn. A groin strain can range from mild to severe depending on how many muscle fibers are affected and whether the muscle fibers are partially or completely torn.  Groin strains usually occur during exercise or participation in sports. The injury often happens when a sudden, violent force is placed on a muscle, stretching the muscle too far. A strain is more likely to occur when your muscles are not warmed up or if you are not properly conditioned. Depending on the severity of the groin strain, recovery time may vary from a few weeks to several weeks. Severe injuries often require 4-6 weeks for recovery. In these cases, complete healing can take 4-5 months.  CAUSES   Stretching the groin muscles too far or too suddenly, often during side-to-side motion with an abrupt change in direction.  Putting repeated stress on the groin muscles over a long period of time.  Performing vigorous activity without properly stretching the groin muscles beforehand. SYMPTOMS   Pain and tenderness in the groin area. This begins as sharp pain and persists as a dull ache.  Popping or snapping feeling when the injury occurs (for severe strains).  Swelling or bruising.  Muscle spasms.  Weakness in the leg.  Stiffness in the groin area with decreased ability to move the affected muscles. DIAGNOSIS  Your caregiver will perform a physical exam to diagnose a groin strain. You will be asked about your symptoms and how the injury occurred. X-rays are sometimes needed to rule out a broken bone or cartilage problems. Your caregiver may order a CT scan or MRI if a complete muscle tear is  suspected. TREATMENT  A groin strain will often heal on its own. Your caregiver may prescribe medicines to help manage pain and swelling (anti-inflammatory medicine). You may be told to use crutches for the first few days to minimize your pain. HOME CARE INSTRUCTIONS   Rest. Do not use the strained muscle if it causes pain.  Put ice on the injured area.  Put ice in a plastic bag.  Place a towel between your skin and the bag.  Leave the ice on for 15-20 minutes, every 2-3 hours. Do this for the first 2 days after the injury.  Only take over-the-counter or prescription medicines as directed by your caregiver.  Wrap the injured area with an elastic bandage as directed by your caregiver.  Keep the injured leg raised (elevated).  Walk, stretch, and perform range-of-motion exercises to improve blood flow to the injured area. Only perform these activities if you can do so without any pain. To prevent muscle strains:  Warm up before exercise.  Develop proper conditioning and strength in the groin muscles. SEEK IMMEDIATE MEDICAL CARE IF:   You have increased pain or swelling in the affected area.   Your symptoms are not improving or are getting worse. MAKE SURE YOU:   Understand these instructions.  Will watch your condition.  Will get help right away if you are not doing well or get worse.   This information is not intended to replace advice given to you by your health care provider. Make sure you discuss any questions you have  with your health care provider.   Document Released: 03/06/2004 Document Revised: 06/25/2012 Document Reviewed: 03/12/2012 Elsevier Interactive Patient Education [employer].

## 2015-09-30 NOTE — ED Provider Notes (Signed)
CSN: [id number]     Arrival date & time 09/30/15  1342 History   First MD Initiated Contact with Patient 09/30/15 1500     Chief Complaint  Patient presents with  . Groin Pain   (Consider location/radiation/quality/duration/timing/severity/associated sxs/prior Treatment) HPI  pt with  Vague complaint of pain in  The right groin, radiating into right side of her vagina. She states that she recalls no injury, vaginal discharge.  States pain hurts more when she is ambulating. She has had no vaginal discharge.  Ibuprofen does help ease pain a bit.                                     Symptoms get worse when she is standing on the leg                                                                                                                                                                                     Past Medical History  Diagnosis Date  . Diabetes mellitus   . Hypertension   . Anemia   . Hep C w/o coma, chronic (HCC)    Past Surgical History  Procedure Laterality Date  . Tubal ligation     Family History  Problem Relation Age of Onset  . Hypertension Mother   . Cancer Mother   . Diabetes Father   . Hypertension Father    Social History  Substance Use Topics  . Smoking status: Never Smoker   . Smokeless tobacco: Never Used  . Alcohol Use: No   OB History    Gravida Para Term Preterm AB TAB SAB Ectopic Multiple Living   3 3 3       3      Review of Systems Right groin pain Allergies  Iodine; Peanut-containing drug products; and Shellfish allergy  Home Medications   Prior to Admission medications   Medication Sig Start Date End Date Taking? Authorizing Provider  amLODipine (NORVASC) 10 MG tablet Take 10 mg by mouth daily.     Yes Historical Provider, MD  gabapentin (NEURONTIN) 100 MG capsule Take 1 capsule by mouth two times daily for one month, then one capsule daily for one month, then discontinue 05/26/14  Yes Lavonia Drafts, MD  Insulin Glargine  (LANTUS SOLOSTAR [place]) Inject 31 Units into the skin daily.    Yes Historical Provider, MD  cyclobenzaprine (FLEXERIL) 10 MG tablet Take 1 tablet (10 mg total) by mouth 2 (two) times daily as needed for muscle spasms. 11/23/14   Tatyana Kirichenko, PA-C  HYDROcodone-acetaminophen (NORCO/VICODIN) 5-325 MG per tablet Take  1 tablet by mouth every 6 (six) hours as needed for moderate pain. 03/27/14   Jola Schmidt, MD  metroNIDAZOLE (FLAGYL) 500 MG tablet Take 1 tablet (500 mg total) by mouth 2 (two) times daily. X 7 days 03/03/15   Janne Napoleon, NP  predniSONE (DELTASONE) 20 MG tablet Take 2 tablets (40 mg total) by mouth daily. 12/25/14   Melony Overly, MD  traMADol (ULTRAM) 50 MG tablet Take 1 tablet (50 mg total) by mouth every 6 (six) hours as needed. 12/25/14   Melony Overly, MD  valsartan-hydrochlorothiazide (DIOVAN-HCT) 320-25 MG per tablet Take 1 tablet by mouth daily.      Historical Provider, MD   Meds Ordered and Administered this Visit  Medications - No data to display  BP 152/90 mmHg  Pulse 74  Temp(Src) 98.3 F (36.8 C) (Oral)  Resp 16  SpO2 99%  LMP 06/26/2011 No data found.   Physical Exam  Constitutional: She is oriented to person, place, and time. She appears well-developed and well-nourished.  HENT:  Head: Normocephalic and atraumatic.  Eyes: Conjunctivae are normal.  Pulmonary/Chest: Effort normal.  Genitourinary: Vagina normal. No vaginal discharge found.  Musculoskeletal: Normal range of motion. She exhibits tenderness.  Neurological: She is alert and oriented to person, place, and time.  Skin: Skin is warm and dry.  Psychiatric: She has a normal mood and affect. Her behavior is normal.  Nursing note and vitals reviewed.   ED Course  Procedures (including critical care time)  Labs Review Labs Reviewed - No data to display  Imaging Review No results found.   Visual Acuity Review  Right Eye Distance:   Left Eye Distance:   Bilateral Distance:    Right Eye Near:    Left Eye Near:    Bilateral Near:         MDM   1. Groin pain, right     Patient is reassured that there are no issues that require transfer to higher level of care at this time or additional tests. Patient is advised to continue home symptomatic treatment. Patient is advised that if there are new or worsening symptoms to attend the emergency department, contact primary care provider, or return to UC. Instructions of care provided discharged home in stable condition. Return to work/school note provided.   THIS NOTE WAS GENERATED USING A VOICE RECOGNITION SOFTWARE PROGRAM. ALL REASONABLE EFFORTS  WERE MADE TO PROOFREAD THIS DOCUMENT FOR ACCURACY.  I have verbally reviewed the discharge instructions with the patient. A printed AVS was given to the patient.  All questions were answered prior to discharge.      Konrad Felix, [place] 09/30/15 1753

## 2015-09-30 NOTE — ED Notes (Signed)
54 y.o./female present with pain on the right side of her groin x2 weeks Patient has treated the pain with a heating pad and tylenol with no relief No acute distress

## 2015-10-05 ENCOUNTER — Ambulatory Visit
Admission: RE | Admit: 2015-10-05 | Discharge: 2015-10-05 | Disposition: A | Discharge status: Home / Self Care | Payer: No Typology Code available for payment source | Source: Ambulatory Visit | Attending: Internal Medicine | Admitting: Internal Medicine

## 2015-10-05 ENCOUNTER — Other Ambulatory Visit: Payer: Self-pay | Admitting: Internal Medicine

## 2015-10-05 DIAGNOSIS — R1031 Right lower quadrant pain: Secondary | ICD-10-CM

## 2015-10-10 ENCOUNTER — Ambulatory Visit (INDEPENDENT_AMBULATORY_CARE_PROVIDER_SITE_OTHER): Payer: Self-pay | Admitting: Licensed Clinical Social Worker

## 2015-10-10 DIAGNOSIS — F419 Anxiety disorder, unspecified: Secondary | ICD-10-CM

## 2015-10-10 NOTE — Progress Notes (Signed)
   THERAPY PROGRESS NOTE  Session Time: 13min  Participation Level: Active  Behavioral Response: Well GroomedAlertEuthymic  Type of Therapy: Individual Therapy  Treatment Goals addressed: Diagnosis: pending  Interventions: Motivational Interviewing  Summary: Maureen Barnes is a 55 y.o. female who presents with a positive mood and appropriate affect. Maureen Barnes shared that she is seeking counseling at this time due to anxiety symptoms that first surfaced several years ago when she was in school. She reported that she has felt the most severe anxiety symptoms when trying to take a test, but also more recently when doing a pre-employment screening for a potential job. Maureen Barnes shared about fears and anxieties that she sometimes has with social relationships, in wondering what others think of her or stressing about why some people don't seem to like her. She also shared about her family of origin and her nuclear family. She reported that she had a mostly positive childhood. She reported that she has good relationships with her three grown children, one of whom lives with her. She shared that she recently left a job because it "did not seem like a good place" because she could not make friendships with the other employees. She shared about other jobs that she lost or was fired from, mostly due to conflicts with supervisors or co-workers. She shared concerns about her medication gabapentin and requested LCSW assistance in learning more about it.   Suicidal/Homicidal: Nowithout intent/plan  Therapist Response: LCSW utilized supportive counseling techniques throughout the session in order to validate emotions and encourage open expression of emotion.LCSW began the clinical assessment but was unable to finish due to time constraints. LCSW inquired about Maureen Barnes's childhood experiences. LCSW asked about Maureen Barnes's relationship with her adult children.  Plan: Return again in 2 weeks.  Diagnosis: Axis I: See  current hospital problem list    Axis II: No diagnosis    Metta Clines, LCSW 10/10/2015

## 2015-10-24 ENCOUNTER — Ambulatory Visit (INDEPENDENT_AMBULATORY_CARE_PROVIDER_SITE_OTHER): Payer: Self-pay | Admitting: Licensed Clinical Social Worker

## 2015-10-24 DIAGNOSIS — F329 Major depressive disorder, single episode, unspecified: Secondary | ICD-10-CM

## 2015-10-24 DIAGNOSIS — F32A Depression, unspecified: Secondary | ICD-10-CM
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
## 2015-11-07 ENCOUNTER — Ambulatory Visit (INDEPENDENT_AMBULATORY_CARE_PROVIDER_SITE_OTHER): Payer: Self-pay | Admitting: Licensed Clinical Social Worker

## 2015-11-07 DIAGNOSIS — F410 Panic disorder [episodic paroxysmal anxiety] without agoraphobia: Secondary | ICD-10-CM

## 2015-11-07 DIAGNOSIS — F330 Major depressive disorder, recurrent, mild: Secondary | ICD-10-CM
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
## 2015-11-21 ENCOUNTER — Ambulatory Visit (INDEPENDENT_AMBULATORY_CARE_PROVIDER_SITE_OTHER): Payer: Self-pay | Admitting: Licensed Clinical Social Worker

## 2015-11-21 DIAGNOSIS — F330 Major depressive disorder, recurrent, mild: Secondary | ICD-10-CM

## 2015-11-21 DIAGNOSIS — F410 Panic disorder [episodic paroxysmal anxiety] without agoraphobia: Secondary | ICD-10-CM

## 2015-11-22 NOTE — Progress Notes (Signed)
   THERAPY PROGRESS NOTE  Session Time: 56min  Participation Level: Active  Behavioral Response: Neat and Well GroomedAlertEuthymic  Type of Therapy: Individual Therapy  Treatment Goals addressed: Coping  Interventions: CBT and DBT  Summary: Maureen Barnes is a 55 y.o. female who presents with a euthymic mood and appropriate affect. [name] reported that she has been feeling pretty good over the past few weeks. She engaged in the guided imagery activity and shared that it made her feel relaxed. She reported that during the guided imagery, which was focused on rejection/failure, she had imagined a time when she thought she was going to fail a class. She described how she was able to pass the class by working hard and advocating for herself to the professor. She was able to identify her first automatic thought as, "I am going to fail this class and I should give up." She shared about how she was able to reframe that thought into, "I might fail but I am going to try as hard as I can." [name] and LCSW discussed how reframing the thought to something more rational and positive gave her the motivation to keep trying. She appeared receptive to LCSW feedback about how to continue identifying automatic thoughts and examining them critically for rationality. [name] shared about a recent experience when she sent a message to her family members and only received responses from a few. She expressed her frustration and hurt to be ignored. She was able to identify her automatic thoughts and subsequent feelings.    Suicidal/Homicidal: Nowithout intent/plan  Therapist Response: LCSW utilized supportive counseling techniques throughout the session in order to validate emotions and encourage open expression of emotion. LCSW assisted in identifying an automatic thought that has been causing distress recently. LCSW helped examine the evidence that supports the thought and the evidence that does not  support the thought. LCSW guided in reframing the irrational thought into a more positive, rational thought. LCSW used Dialectical Behavioral Therapy technique of guided imagery with a theme of rejection/failure in order to teach relaxation skills and address [name]'s feelings of rejection.  Plan: Return again in 2 weeks.  Diagnosis: Axis I: See current hospital problem list    Axis II: No diagnosis    Metta Clines, LCSW 11/22/2015

## 2015-11-30 ENCOUNTER — Other Ambulatory Visit: Payer: Self-pay | Admitting: Internal Medicine

## 2015-11-30 DIAGNOSIS — Z1231 Encounter for screening mammogram for malignant neoplasm of breast: Secondary | ICD-10-CM

## 2015-12-05 ENCOUNTER — Ambulatory Visit (INDEPENDENT_AMBULATORY_CARE_PROVIDER_SITE_OTHER): Payer: Self-pay | Admitting: Licensed Clinical Social Worker

## 2015-12-05 DIAGNOSIS — F330 Major depressive disorder, recurrent, mild: Secondary | ICD-10-CM

## 2015-12-05 DIAGNOSIS — F410 Panic disorder [episodic paroxysmal anxiety] without agoraphobia: Secondary | ICD-10-CM

## 2015-12-05 NOTE — Progress Notes (Signed)
   THERAPY PROGRESS NOTE  Session Time: 53min  Participation Level: Active  Behavioral Response: Casual and Well GroomedAlertEuthymic  Type of Therapy: Individual Therapy  Treatment Goals addressed: Coping  Interventions: CBT  Summary: Maureen Barnes is a 55 y.o. female who presents with a positive mood and appropriate affect. [name] shared that she had a good mother's day despite not being able to spend the day with her children. She expressed sadness that her mother died, so she could not celebrate the day with her own mother. [name] participated in an intervention called Thought Detective in order to practice identifying negative thoughts and reframing them into more positive and rational thoughts. She identified her negative thought on mother's day as "Everyone will be celebrating with their families and I won't." She was able to examine the evidence that supported or did not support that thought and determined that it was an irrational thought. 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 She shared that she feels worried that she will grow old and that her children won't want to care for her. She shared about the importance of meeting her goals and staying active.   Suicidal/Homicidal: Nowithout intent/plan  Therapist Response: LCSW utilized supportive counseling techniques throughout the session in order to validate emotions and encourage open expression of emotion. LCSW assisted in identifying an automatic thought that has been causing distress recently. LCSW helped examine the evidence that supports the thought and the evidence that does not support the thought. LCSW guided in reframing the irrational thought into a more positive, rational thought. LCSW reframed [name]'s worries about growing  old as an opportunity to focus on herself and her life goals during a transition time in her life.  Plan: Return again in 2 weeks.  Diagnosis: Axis I: See current hospital problem list    Axis II: No diagnosis    Metta Clines, LCSW 12/05/2015

## 2015-12-13 ENCOUNTER — Ambulatory Visit
Admission: RE | Admit: 2015-12-13 | Discharge: 2015-12-13 | Disposition: A | Discharge status: Home / Self Care | Payer: No Typology Code available for payment source | Source: Ambulatory Visit | Attending: Internal Medicine | Admitting: Internal Medicine

## 2015-12-13 DIAGNOSIS — Z1231 Encounter for screening mammogram for malignant neoplasm of breast: Secondary | ICD-10-CM

## 2015-12-16 ENCOUNTER — Ambulatory Visit (INDEPENDENT_AMBULATORY_CARE_PROVIDER_SITE_OTHER): Payer: Self-pay | Admitting: Licensed Clinical Social Worker

## 2015-12-16 DIAGNOSIS — F410 Panic disorder [episodic paroxysmal anxiety] without agoraphobia: Secondary | ICD-10-CM

## 2015-12-16 DIAGNOSIS — F330 Major depressive disorder, recurrent, mild: Secondary | ICD-10-CM

## 2015-12-20 NOTE — Progress Notes (Signed)
   THERAPY PROGRESS NOTE  Session Time: 18min  Participation Level: Active  Behavioral Response: Neat and Well GroomedAlertEuthymic  Type of Therapy: Individual Therapy  Treatment Goals addressed: Coping  Interventions: Supportive  Summary: Maureen Barnes is a 55 y.o. female who presents with a euthymic mood and appropriate affect. She reported that she has been doing well over the past few weeks. She shared that she is working towards feeling ready to work again. Maureen Barnes and LCSW discussed what obstacles she feels that she has in regards to finding work. She reviewed her life circumstances during the last time that she worked and compared to her current circumstances. She reported that she worries that her health is not good enough to work long-term and is trying to live a healthier life. She reviewed the steps that she is currently taking for her health as well as identified the steps that she needs to take. She committed to being more active on a daily basis. She shared about feelings of happiness that some of her family members are including her more.   Suicidal/Homicidal: Nowithout intent/plan  Therapist Response: LCSW utilized supportive counseling techniques throughout the session in order to validate emotions and encourage open expression of emotion. LCSW and Maureen Barnes processed about her current health challenges and the small steps that she can take to improve her health.  Plan: Return again in 2 weeks.  Diagnosis: Axis I: See current hospital problem list    Axis II: No diagnosis    Metta Clines, LCSW 12/20/2015

## 2015-12-30 ENCOUNTER — Other Ambulatory Visit: Payer: Self-pay | Admitting: Licensed Clinical Social Worker

## 2016-01-04 ENCOUNTER — Ambulatory Visit (INDEPENDENT_AMBULATORY_CARE_PROVIDER_SITE_OTHER): Payer: Self-pay | Admitting: Licensed Clinical Social Worker

## 2016-01-04 DIAGNOSIS — F410 Panic disorder [episodic paroxysmal anxiety] without agoraphobia: Secondary | ICD-10-CM

## 2016-01-04 DIAGNOSIS — F330 Major depressive disorder, recurrent, mild: Secondary | ICD-10-CM

## 2016-01-05 NOTE — Progress Notes (Signed)
   THERAPY PROGRESS NOTE  Session Time: 22min  Participation Level: Active  Behavioral Response: Neat and Well GroomedAlertEuthymic  Type of Therapy: Individual Therapy  Treatment Goals addressed: Coping  Interventions: CBT and Supportive  Summary: Maureen Barnes is a 55 y.o. female who presents with a euthymic mood and appropriate affect. She reported that she has been doing well lately except that she has had more pain than usual. She shared that she was coping with her pain by resting and using heat when needed. She reported that she was trying hard to make healthy eating habits but her weak spot is craving sweets. She shared that she has not done a lot of physical activity recently because of her pain. [name] reported that she has had just one panic attack over the past several weeks. She reported that the attack may have been a hot flash. [name] participated in CBT workbook activity called Ways to Raise My Mood. She identified cognitive, interpersonal, and action strategies to help herself when she has a distressing day. She identified a positive thought that she tells herself as, "I can get through this." She reviewed the strategy for how to identify automatic thoughts, assess the evidence for rationality, and reframe the thought more positively and rationally.She stated that she talks to her good friend, her brother, and her daughter when she needs support. She identified additional coping skills such as taking a walk, hot shower, take a rest, stretch, and do arts and crafts.  Suicidal/Homicidal: Nowithout intent/plan  Therapist Response: LCSW assisted in identifying an automatic thought that has been causing distress recently. LCSW helped examine the evidence that supports the thought and the evidence that does not support the thought. LCSW guided in reframing the irrational thought into a more positive, rational thought. LCSW utilized supportive counseling techniques  throughout the session in order to validate emotions and encourage open expression of emotion. LCSW and [name] completed an activity called Ways to Raise My Mood in order to identify existing coping skills and develop new ones.  Plan: Return again in 2 weeks.  Diagnosis: Axis I: See current hospital problem list    Axis II: No diagnosis    Metta Clines, LCSW 01/05/2016

## 2016-01-20 ENCOUNTER — Ambulatory Visit (INDEPENDENT_AMBULATORY_CARE_PROVIDER_SITE_OTHER): Payer: Self-pay | Admitting: Licensed Clinical Social Worker

## 2016-01-20 DIAGNOSIS — F330 Major depressive disorder, recurrent, mild: Secondary | ICD-10-CM

## 2016-01-20 DIAGNOSIS — F410 Panic disorder [episodic paroxysmal anxiety] without agoraphobia: Secondary | ICD-10-CM

## 2016-01-20 NOTE — Progress Notes (Signed)
   THERAPY PROGRESS NOTE  Session Time: 54min  Participation Level: Active  Behavioral Response: Neat and Well GroomedAlertEuthymic  Type of Therapy: Individual Therapy  Treatment Goals addressed: Coping  Interventions: Strength-based and Supportive  Summary: Maureen Barnes is a 55 y.o. female who presents with a positive mood and appropriate affect. [name] reported that she has been feeling very good over the past few weeks. She reported that she has started talking to a man who she is interested in, which has helped to boost her self-esteem. She shared that she has focused on keeping positive thoughts in regards to motivation to return to work, and feels nearly ready. She reported that she has started working out again. She expressed pride in herself that she has been taking concrete steps to meet her goals. [name] shared about a recent experience where she felt on the verge of a panic attack in [employer] due to stress and uncertainty; she was able to recognize her mood and intervene to calm herself down. She stated that she has more confidence that she can calm herself down and "get back on track."   Suicidal/Homicidal: Nowithout intent/plan  Therapist Response: LCSW utilized supportive counseling techniques throughout the session in order to validate emotions and encourage open expression of emotion. LCSW highlighted and summarized the many accomplishments that [name] had shared in the session; LCSW emphasized that many small steps are turning into big change. LCSW provided affirmations to [name] for working hard on self-regulation.  Plan: Return again in 2 weeks.  Diagnosis: Axis I: See current hospital problem list    Axis II: No diagnosis    Metta Clines, LCSW 01/20/2016

## 2016-06-28 ENCOUNTER — Other Ambulatory Visit: Payer: Self-pay | Admitting: Internal Medicine

## 2016-06-28 ENCOUNTER — Ambulatory Visit
Admission: RE | Admit: 2016-06-28 | Discharge: 2016-06-28 | Disposition: A | Discharge status: Home / Self Care | Payer: Self-pay | Source: Ambulatory Visit | Attending: Internal Medicine | Admitting: Internal Medicine

## 2016-06-28 DIAGNOSIS — M25551 Pain in right hip: Secondary | ICD-10-CM

## 2016-06-28 DIAGNOSIS — M25552 Pain in left hip: Principal | ICD-10-CM

## 2016-10-12 ENCOUNTER — Ambulatory Visit (INDEPENDENT_AMBULATORY_CARE_PROVIDER_SITE_OTHER): Payer: Self-pay | Admitting: Physician Assistant

## 2016-10-12 ENCOUNTER — Encounter (INDEPENDENT_AMBULATORY_CARE_PROVIDER_SITE_OTHER): Payer: Self-pay | Admitting: Physician Assistant

## 2016-10-12 VITALS — BP 158/95 | HR 94 | Temp 98.0°F | Ht 63.0 in | Wt 172.8 lb

## 2016-10-12 DIAGNOSIS — I10 Essential (primary) hypertension: Secondary | ICD-10-CM

## 2016-10-12 DIAGNOSIS — D649 Anemia, unspecified: Secondary | ICD-10-CM

## 2016-10-12 DIAGNOSIS — L819 Disorder of pigmentation, unspecified: Secondary | ICD-10-CM

## 2016-10-12 DIAGNOSIS — E119 Type 2 diabetes mellitus without complications: Secondary | ICD-10-CM

## 2016-10-12 DIAGNOSIS — Z794 Long term (current) use of insulin: Secondary | ICD-10-CM

## 2016-10-12 LAB — GLUCOSE, POCT (MANUAL RESULT ENTRY): POC Glucose: 138 mg/dl — AB (ref 70–99)

## 2016-10-12 LAB — POCT GLYCOSYLATED HEMOGLOBIN (HGB A1C): Hemoglobin A1C: 5.6

## 2016-10-12 MED ORDER — CARVEDILOL 12.5 MG PO TABS: 12.5000 mg | ORAL_TABLET | Freq: Two times a day (BID) | ORAL | 1 refills | Status: DC

## 2016-10-12 NOTE — Patient Instructions (Signed)
DASH Eating Plan DASH stands for "Dietary Approaches to Stop Hypertension." The DASH eating plan is a healthy eating plan that has been shown to reduce high blood pressure (hypertension). It may also reduce your risk for type 2 diabetes, heart disease, and stroke. The DASH eating plan may also help with weight loss. What are tips for following this plan? General guidelines  Avoid eating more than 2,300 mg (milligrams) of salt (sodium) a day. If you have hypertension, you may need to reduce your sodium intake to 1,500 mg a day.  Limit alcohol intake to no more than 1 drink a day for nonpregnant women and 2 drinks a day for men. One drink equals 12 oz of beer, 5 oz of wine, or 1 oz of hard liquor.  Work with your health care provider to maintain a healthy body weight or to lose weight. Ask what an ideal weight is for you.  Get at least 30 minutes of exercise that causes your heart to beat faster (aerobic exercise) most days of the week. Activities may include walking, swimming, or biking.  Work with your health care provider or diet and nutrition specialist (dietitian) to adjust your eating plan to your individual calorie needs. Reading food labels  Check food labels for the amount of sodium per serving. Choose foods with less than 5 percent of the Daily Value of sodium. Generally, foods with less than 300 mg of sodium per serving fit into this eating plan.  To find whole grains, look for the word "whole" as the first word in the ingredient list. Shopping  Buy products labeled as "low-sodium" or "no salt added."  Buy fresh foods. Avoid canned foods and premade or frozen meals. Cooking  Avoid adding salt when cooking. Use salt-free seasonings or herbs instead of table salt or sea salt. Check with your health care provider or pharmacist before using salt substitutes.  Do not fry foods. Cook foods using healthy methods such as baking, boiling, grilling, and broiling instead.  Cook with  heart-healthy oils, such as olive, canola, soybean, or sunflower oil. Meal planning   Eat a balanced diet that includes: ? 5 or more servings of fruits and vegetables each day. At each meal, try to fill half of your plate with fruits and vegetables. ? Up to 6-8 servings of whole grains each day. ? Less than 6 oz of lean meat, poultry, or fish each day. A 3-oz serving of meat is about the same size as a deck of cards. One egg equals 1 oz. ? 2 servings of low-fat dairy each day. ? A serving of nuts, seeds, or beans 5 times each week. ? Heart-healthy fats. Healthy fats called Omega-3 fatty acids are found in foods such as flaxseeds and coldwater fish, like sardines, salmon, and mackerel.  Limit how much you eat of the following: ? Canned or prepackaged foods. ? Food that is high in trans fat, such as fried foods. ? Food that is high in saturated fat, such as fatty meat. ? Sweets, desserts, sugary drinks, and other foods with added sugar. ? Full-fat dairy products.  Do not salt foods before eating.  Try to eat at least 2 vegetarian meals each week.  Eat more home-cooked food and less restaurant, buffet, and fast food.  When eating at a restaurant, ask that your food be prepared with less salt or no salt, if possible. What foods are recommended? The items listed may not be a complete list. Talk with your dietitian about what   dietary choices are best for you. Grains Whole-grain or whole-wheat bread. Whole-grain or whole-wheat pasta. Brown rice. Oatmeal. Quinoa. Bulgur. Whole-grain and low-sodium cereals. Pita bread. Low-fat, low-sodium crackers. Whole-wheat flour tortillas. Vegetables Fresh or frozen vegetables (raw, steamed, roasted, or grilled). Low-sodium or reduced-sodium tomato and vegetable juice. Low-sodium or reduced-sodium tomato sauce and tomato paste. Low-sodium or reduced-sodium canned vegetables. Fruits All fresh, dried, or frozen fruit. Canned fruit in natural juice (without  added sugar). Meat and other protein foods Skinless chicken or turkey. Ground chicken or turkey. Pork with fat trimmed off. Fish and seafood. Egg whites. Dried beans, peas, or lentils. Unsalted nuts, nut butters, and seeds. Unsalted canned beans. Lean cuts of beef with fat trimmed off. Low-sodium, lean deli meat. Dairy Low-fat (1%) or fat-free (skim) milk. Fat-free, low-fat, or reduced-fat cheeses. Nonfat, low-sodium ricotta or cottage cheese. Low-fat or nonfat yogurt. Low-fat, low-sodium cheese. Fats and oils Soft margarine without trans fats. Vegetable oil. Low-fat, reduced-fat, or light mayonnaise and salad dressings (reduced-sodium). Canola, safflower, olive, soybean, and sunflower oils. Avocado. Seasoning and other foods Herbs. Spices. Seasoning mixes without salt. Unsalted popcorn and pretzels. Fat-free sweets. What foods are not recommended? The items listed may not be a complete list. Talk with your dietitian about what dietary choices are best for you. Grains Baked goods made with fat, such as croissants, muffins, or some breads. Dry pasta or rice meal packs. Vegetables Creamed or fried vegetables. Vegetables in a cheese sauce. Regular canned vegetables (not low-sodium or reduced-sodium). Regular canned tomato sauce and paste (not low-sodium or reduced-sodium). Regular tomato and vegetable juice (not low-sodium or reduced-sodium). Pickles. Olives. Fruits Canned fruit in a light or heavy syrup. Fried fruit. Fruit in cream or butter sauce. Meat and other protein foods Fatty cuts of meat. Ribs. Fried meat. Bacon. Sausage. Bologna and other processed lunch meats. Salami. Fatback. Hotdogs. Bratwurst. Salted nuts and seeds. Canned beans with added salt. Canned or smoked fish. Whole eggs or egg yolks. Chicken or turkey with skin. Dairy Whole or 2% milk, cream, and half-and-half. Whole or full-fat cream cheese. Whole-fat or sweetened yogurt. Full-fat cheese. Nondairy creamers. Whipped toppings.  Processed cheese and cheese spreads. Fats and oils Butter. Stick margarine. Lard. Shortening. Ghee. Bacon fat. Tropical oils, such as coconut, palm kernel, or palm oil. Seasoning and other foods Salted popcorn and pretzels. Onion salt, garlic salt, seasoned salt, table salt, and sea salt. Worcestershire sauce. Tartar sauce. Barbecue sauce. Teriyaki sauce. Soy sauce, including reduced-sodium. Steak sauce. Canned and packaged gravies. Fish sauce. Oyster sauce. Cocktail sauce. Horseradish that you find on the shelf. Ketchup. Mustard. Meat flavorings and tenderizers. Bouillon cubes. Hot sauce and Tabasco sauce. Premade or packaged marinades. Premade or packaged taco seasonings. Relishes. Regular salad dressings. Where to find more information:  National Heart, Lung, and Blood Institute: www.nhlbi.nih.gov  American Heart Association: www.heart.org Summary  The DASH eating plan is a healthy eating plan that has been shown to reduce high blood pressure (hypertension). It may also reduce your risk for type 2 diabetes, heart disease, and stroke.  With the DASH eating plan, you should limit salt (sodium) intake to 2,300 mg a day. If you have hypertension, you may need to reduce your sodium intake to 1,500 mg a day.  When on the DASH eating plan, aim to eat more fresh fruits and vegetables, whole grains, lean proteins, low-fat dairy, and heart-healthy fats.  Work with your health care provider or diet and nutrition specialist (dietitian) to adjust your eating plan to your individual   calorie needs. This information is not intended to replace advice given to you by your health care provider. Make sure you discuss any questions you have with your health care provider. Document Released: 06/28/2011 Document Revised: 07/02/2016 Document Reviewed: 07/02/2016 Elsevier Interactive Patient Education  2017 Elsevier Inc.  

## 2016-10-12 NOTE — Progress Notes (Signed)
Subjective:  Patient ID: Maureen Barnes, female    DOB: 1961/05/30  Age: 56 y.o. MRN: [id number]  CC: discuss care  HPI [name] is a 56 y.o. female with a PMH of DM2, HTN, and anemia presents to discuss care. She is an established pt with [facility] and reports having all medications and treatment up to date. She simply wants to see if this office is a better and lower cost alternative to [facility]. She denies any complaints or symptoms.   ROS Review of Systems  Constitutional: Negative for chills, fever and malaise/fatigue.  Eyes: Negative for blurred vision.  Respiratory: Negative for shortness of breath.   Cardiovascular: Negative for chest pain and palpitations.  Gastrointestinal: Negative for abdominal pain and nausea.  Genitourinary: Negative for dysuria and hematuria.  Musculoskeletal: Negative for joint pain and myalgias.  Skin: Negative for rash.  Neurological: Negative for tingling and headaches.  Endo/Heme/Allergies: Negative for polydipsia.  Psychiatric/Behavioral: Negative for depression. The patient is not nervous/anxious.     Objective:  BP (!) 158/95 (BP Location: Left Arm, Patient Position: Sitting, Cuff Size: Normal)   Pulse 94   Temp 98 F (36.7 C) (Oral)   Ht 5\' 3"  (1.6 m)   Wt 172 lb 12.8 oz (78.4 kg)   LMP 07/10/2011   SpO2 98%   BMI 30.61 kg/m   BP/Weight 10/12/2016 09/30/2015 8/85/0277  Systolic BP 412 878 676  Diastolic BP 95 90 92  Wt. (Lbs) 172.8 - -  BMI 30.61 - -      Physical Exam  Constitutional: She is oriented to person, place, and time.  Well developed, well nourished, NAD, polite  HENT:  Head: Normocephalic and atraumatic.  Eyes: No scleral icterus.  Neck: Normal range of motion. Neck supple. No thyromegaly present.  Cardiovascular: Normal rate, regular rhythm and normal heart sounds.   Pulmonary/Chest: Effort normal and breath sounds normal.  Abdominal: Soft. Bowel sounds are normal. There is no  tenderness.  Musculoskeletal: She exhibits no edema.  Neurological: She is alert and oriented to person, place, and time. No cranial nerve deficit. Coordination normal.  No loss of protective sensation of the feet with monofilament  Skin: Skin is warm and dry. No rash noted. No erythema. No pallor.  Psychiatric: She has a normal mood and affect. Thought content normal.  Vitals reviewed.    Assessment & Plan:   1. Hypertension, unspecified type - Uncontrolled. It was found through discussion that she was advised to take Carvedilol by her PCP but she did not fill. I recommended Carvedilol also and wrote rx for pt.  - Comprehensive metabolic panel - TSH  2. Diabetes mellitus type 2, insulin dependent ([place]) - Not clear if pt has completed her annual requirements for DM. Diabetic foot exam conducted today. - HgB A1c 5.6% in clinic today - Glucose (CBG) 138 in clinic today - Microalbumin/Creatinine Ratio, Urine - Ambulatory referral to Ophthalmology  3. Anemia, unspecified type - Remote hx of anemia. Recommend testing today. Pt asymptomatic. - CBC with Differential/Platelet  4. Hyperpigmentation - Noted in the palms and soles - RPR - HIV antibody (with reflex)  * Pt to bring medical records from [facility] for review. She did not want to draw blood for all labs today because, "I am in trouble with [facility]".    Meds ordered this encounter  Medications  . carvedilol (COREG) 12.5 MG tablet    Sig: Take 1 tablet (12.5 mg total) by mouth 2 (two)  times daily with a meal.    Dispense:  90 tablet    Refill:  1    Order Specific Question:   Supervising Provider    Answer:   Tresa Garter [[id number]]    Follow-up: Return in about 4 weeks (around 11/09/2016), or if symptoms worsen or fail to improve, for HTN.   Clent Demark PA

## 2016-10-13 LAB — HIV ANTIBODY (ROUTINE TESTING W REFLEX): HIV Screen 4th Generation wRfx: NONREACTIVE

## 2016-10-13 LAB — RPR: RPR Ser Ql: NONREACTIVE

## 2016-10-15 ENCOUNTER — Encounter (INDEPENDENT_AMBULATORY_CARE_PROVIDER_SITE_OTHER): Payer: Self-pay | Admitting: Physician Assistant

## 2016-10-18 ENCOUNTER — Ambulatory Visit (INDEPENDENT_AMBULATORY_CARE_PROVIDER_SITE_OTHER): Payer: Self-pay | Admitting: Physician Assistant

## 2016-10-18 ENCOUNTER — Ambulatory Visit (INDEPENDENT_AMBULATORY_CARE_PROVIDER_SITE_OTHER): Payer: No Typology Code available for payment source | Admitting: Physician Assistant

## 2016-10-18 ENCOUNTER — Telehealth (INDEPENDENT_AMBULATORY_CARE_PROVIDER_SITE_OTHER): Payer: Self-pay | Admitting: Physician Assistant

## 2016-10-18 ENCOUNTER — Encounter (INDEPENDENT_AMBULATORY_CARE_PROVIDER_SITE_OTHER): Payer: Self-pay | Admitting: Physician Assistant

## 2016-10-18 VITALS — BP 163/96 | HR 82 | Temp 98.1°F | Ht 63.0 in | Wt 172.4 lb

## 2016-10-18 DIAGNOSIS — J322 Chronic ethmoidal sinusitis: Secondary | ICD-10-CM | POA: Insufficient documentation

## 2016-10-18 DIAGNOSIS — Z9119 Patient's noncompliance with other medical treatment and regimen: Secondary | ICD-10-CM

## 2016-10-18 DIAGNOSIS — I10 Essential (primary) hypertension: Secondary | ICD-10-CM

## 2016-10-18 DIAGNOSIS — N183 Chronic kidney disease, stage 3 unspecified: Secondary | ICD-10-CM

## 2016-10-18 DIAGNOSIS — L299 Pruritus, unspecified: Secondary | ICD-10-CM

## 2016-10-18 DIAGNOSIS — Z91199 Patient's noncompliance with other medical treatment and regimen due to unspecified reason: Secondary | ICD-10-CM | POA: Insufficient documentation

## 2016-10-18 MED ORDER — SALINE SPRAY 0.65 % NA SOLN: 1.0000 | NASAL | 0 refills | Status: DC | PRN

## 2016-10-18 MED ORDER — PHENYLEPHRINE HCL 0.5 % NA SOLN: 1.0000 [drp] | Freq: Four times a day (QID) | NASAL | 0 refills | Status: DC | PRN

## 2016-10-18 MED ORDER — ACETIC ACID 2 % OT SOLN: 4.0000 [drp] | Freq: Three times a day (TID) | OTIC | 0 refills | Status: DC

## 2016-10-18 MED ORDER — AMOXICILLIN-POT CLAVULANATE 875-125 MG PO TABS: 1.0000 | ORAL_TABLET | Freq: Two times a day (BID) | ORAL | 0 refills | Status: DC

## 2016-10-18 MED ORDER — GUAIFENESIN ER 1200 MG PO TB12: 1.0000 | ORAL_TABLET | Freq: Two times a day (BID) | ORAL | 0 refills | Status: DC

## 2016-10-18 MED ORDER — LOSARTAN POTASSIUM-HCTZ 100-25 MG PO TABS: 1.0000 | ORAL_TABLET | Freq: Every day | ORAL | 3 refills | Status: DC

## 2016-10-18 NOTE — Patient Instructions (Signed)

## 2016-10-18 NOTE — Progress Notes (Signed)
Subjective:  Patient ID: Maureen Barnes, female    DOB: 1961-04-27  Age: 56 y.o. MRN: [id number]  CC:  sinusitis  HPI Maureen Barnes is a 56 y.o. female with a PMH of HTN, DM2, and CKD3 presents with one month history of frontal sinus pain and right sided eye pressure. Previously took Doxycycline for the same at [facility] without relief. She has taken several OTC products with little to no relief. Does not know if she has experienced fever or chills. Also complains of itching in the right ear canal.     BP is noted to be higher today than at last visit despite having been prescribed another anti-hypertensive. Says that she has not yet taken carvedilol because she did not know if carvedilol would interact with her other antihypertensives.     Pt has CKD stage 3 and was followed by [facility]. She was due for f/u approximately 2-3 months ago but did not show. Says she will make an appointment to see them soon. She does not endorse any urinary symptoms or back pain.    Review of Systems  Constitutional: Negative for chills, fever and malaise/fatigue.  HENT: Positive for sinus pain.   Eyes: Positive for pain. Negative for blurred vision.  Respiratory: Negative for shortness of breath.   Cardiovascular: Negative for chest pain and palpitations.  Gastrointestinal: Negative for abdominal pain and nausea.  Genitourinary: Negative for dysuria and hematuria.  Musculoskeletal: Negative for joint pain and myalgias.  Skin: Positive for itching. Negative for rash.  Neurological: Negative for tingling and headaches.  Psychiatric/Behavioral: Negative for depression. The patient is not nervous/anxious.     Objective:  BP (!) 163/96 (BP Location: Left Arm, Patient Position: Sitting)   Pulse 82   Temp 98.1 F (36.7 C) (Oral)   Ht 5\' 3"  (1.6 m)   Wt 172 lb 6.4 oz (78.2 kg)   LMP 07/10/2011   SpO2 100%   BMI 30.54 kg/m   BP/Weight 10/18/2016 10/12/2016 6/31/4970   Systolic BP 263 785 885  Diastolic BP 96 95 90  Wt. (Lbs) 172.4 172.8 -  BMI 30.54 30.61 -      Physical Exam  Constitutional: She is oriented to person, place, and time.  Well developed, obese, NAD, polite  HENT:  Head: Normocephalic and atraumatic.  Turbinates hypertrophic and erythematous. TMs normal, right ear canal with mild erythema. Oropharynx unremarkable.  Eyes: No scleral icterus.  Neck: Normal range of motion. Neck supple. No thyromegaly present.  Cardiovascular: Normal rate, regular rhythm and normal heart sounds.   Pulmonary/Chest: Effort normal and breath sounds normal.  Abdominal: Soft. Bowel sounds are normal. There is no tenderness.  Musculoskeletal: She exhibits no edema.  Lymphadenopathy:    She has no cervical adenopathy.  Neurological: She is alert and oriented to person, place, and time. No cranial nerve deficit. Coordination normal.  Skin: Skin is warm and dry. No rash noted. No erythema. No pallor.  Psychiatric: She has a normal mood and affect. Her behavior is normal. Thought content normal.  Vitals reviewed.    Assessment & Plan:   1. Chronic ethmoidal sinusitis - Will consider CT sinus should patient's symptoms persist. However, patient has financial barriers and does not know how testing will be paid. Still has not completed [facility] application. - amoxicillin-clavulanate (AUGMENTIN) 875-125 MG tablet; Take 1 tablet by mouth 2 (two) times daily.  Dispense: 20 tablet; Refill: 0 - sodium chloride (OCEAN) 0.65 % SOLN nasal spray;  Place 1 spray into both nostrils as needed for congestion.  Dispense: 1 Bottle; Refill: 0 - Guaifenesin (MUCINEX MAXIMUM STRENGTH) 1200 MG TB12; Take 1 tablet (1,200 mg total) by mouth 2 (two) times daily.  Dispense: 10 tablet; Refill: 0 - phenylephrine (NEO-SYNEPHRINE) 0.5 % nasal solution; Place 1 drop into both nostrils every 6 (six) hours as needed for congestion.  Dispense: 15 mL; Refill: 0  2.  Pruritus - acetic acid (VOSOL) 2 % otic solution; Place 4 drops into both ears 3 (three) times daily.  Dispense: 15 mL; Refill: 0  3. CKD (chronic kidney disease) stage 3, GFR 30-59 ml/min - Pt to f/u with [place]  4. Hypertension, unspecified type - Take carvedilol as directed - return for f/u of HTN. Will consider renal US/ secondary HTN if BP still elevated despite taking all medications.  5. Non-compliance    Follow-up: Return for already has appt.   Clent Demark PA

## 2016-10-18 NOTE — Telephone Encounter (Signed)
Sent all prescriptions to [facility] pharmacy, patient notified. Nat Christen, CMA

## 2016-10-18 NOTE — Telephone Encounter (Signed)
Patient left voicemail stated Rx too expensive at [facility] if they could be sent to [place],  Stated [facility] could not reroute Rx to another pharm

## 2016-10-22 ENCOUNTER — Telehealth (INDEPENDENT_AMBULATORY_CARE_PROVIDER_SITE_OTHER): Payer: Self-pay | Admitting: Physician Assistant

## 2016-10-22 DIAGNOSIS — J322 Chronic ethmoidal sinusitis: Secondary | ICD-10-CM

## 2016-10-22 MED ORDER — AMOXICILLIN-POT CLAVULANATE 875-125 MG PO TABS: 1.0000 | ORAL_TABLET | Freq: Two times a day (BID) | ORAL | 0 refills | Status: DC

## 2016-10-22 MED ORDER — GUAIFENESIN ER 1200 MG PO TB12: 1.0000 | ORAL_TABLET | Freq: Two times a day (BID) | ORAL | 0 refills | Status: DC

## 2016-10-22 NOTE — Telephone Encounter (Signed)
Resent to [facility] pharmacy economical purposes for patient.

## 2016-10-22 NOTE — Telephone Encounter (Signed)
Patient came in stated Rx was not sent to [facility], please resend to [facility] pharm.  Confirmed with [place] that RX was not received last week.

## 2016-10-23 ENCOUNTER — Other Ambulatory Visit (INDEPENDENT_AMBULATORY_CARE_PROVIDER_SITE_OTHER): Payer: Self-pay | Admitting: Physician Assistant

## 2016-10-23 DIAGNOSIS — L299 Pruritus, unspecified: Secondary | ICD-10-CM

## 2016-10-23 DIAGNOSIS — J322 Chronic ethmoidal sinusitis: Secondary | ICD-10-CM

## 2016-10-23 MED ORDER — PHENYLEPHRINE HCL 0.5 % NA SOLN: 1.0000 [drp] | Freq: Four times a day (QID) | NASAL | 0 refills | Status: DC | PRN

## 2016-10-23 MED ORDER — AMOXICILLIN-POT CLAVULANATE 875-125 MG PO TABS: 1.0000 | ORAL_TABLET | Freq: Two times a day (BID) | ORAL | 0 refills | Status: AC

## 2016-10-23 MED ORDER — SALINE SPRAY 0.65 % NA SOLN: 1.0000 | NASAL | 0 refills | Status: DC | PRN

## 2016-10-23 MED ORDER — ACETIC ACID 2 % OT SOLN: 4.0000 [drp] | Freq: Three times a day (TID) | OTIC | 0 refills | Status: DC

## 2016-10-23 MED ORDER — GUAIFENESIN ER 1200 MG PO TB12: 1.0000 | ORAL_TABLET | Freq: Two times a day (BID) | ORAL | 0 refills | Status: AC

## 2016-10-23 MED FILL — AMOX-CLAV 875-125 MG TABLET: 875-125 | 10 days supply | Qty: 20 | Fill #0

## 2016-10-23 NOTE — Progress Notes (Signed)
Requests other medications to be printed.

## 2016-10-23 NOTE — Progress Notes (Signed)
Pt having difficulty obtaining already eprescribed Augmentin. I have printed rx for her to take to pharmacy of choice.

## 2016-11-12 ENCOUNTER — Ambulatory Visit (INDEPENDENT_AMBULATORY_CARE_PROVIDER_SITE_OTHER): Payer: Self-pay | Admitting: Physician Assistant

## 2016-11-12 ENCOUNTER — Encounter (INDEPENDENT_AMBULATORY_CARE_PROVIDER_SITE_OTHER): Payer: Self-pay | Admitting: Physician Assistant

## 2016-11-12 VITALS — BP 133/87 | HR 81 | Temp 98.1°F | Ht 63.0 in | Wt 170.2 lb

## 2016-11-12 DIAGNOSIS — I10 Essential (primary) hypertension: Secondary | ICD-10-CM

## 2016-11-12 DIAGNOSIS — J301 Allergic rhinitis due to pollen: Secondary | ICD-10-CM

## 2016-11-12 DIAGNOSIS — Z1231 Encounter for screening mammogram for malignant neoplasm of breast: Secondary | ICD-10-CM

## 2016-11-12 MED ORDER — DIPHENHYDRAMINE HCL 25 MG PO TABS: 25.0000 mg | ORAL_TABLET | Freq: Every evening | ORAL | 0 refills | Status: AC | PRN

## 2016-11-12 MED ORDER — SALINE SPRAY 0.65 % NA SOLN: 1.0000 | NASAL | 0 refills | Status: DC | PRN

## 2016-11-12 NOTE — Patient Instructions (Signed)
Managing Your Hypertension Hypertension is commonly called high blood pressure. This is when the force of your blood pressing against the walls of your arteries is too strong. Arteries are blood vessels that carry blood from your heart throughout your body. Hypertension forces the heart to work harder to pump blood, and may cause the arteries to become narrow or stiff. Having untreated or uncontrolled hypertension can cause heart attack, stroke, kidney disease, and other problems. What are blood pressure readings? A blood pressure reading consists of a higher number over a lower number. Ideally, your blood pressure should be below 120/80. The first ("top") number is called the systolic pressure. It is a measure of the pressure in your arteries as your heart beats. The second ("bottom") number is called the diastolic pressure. It is a measure of the pressure in your arteries as the heart relaxes. What does my blood pressure reading mean? Blood pressure is classified into four stages. Based on your blood pressure reading, your health care provider may use the following stages to determine what type of treatment you need, if any. Systolic pressure and diastolic pressure are measured in a unit called mm Hg. Normal   Systolic pressure: below 120.  Diastolic pressure: below 80. Elevated   Systolic pressure: 120-129.  Diastolic pressure: below 80. Hypertension stage 1     Diastolic pressure: 80-89. Hypertension stage 2   Systolic pressure: 140 or above.  Diastolic pressure: 90 or above. What health risks are associated with hypertension? Managing your hypertension is an important responsibility. Uncontrolled hypertension can lead to:  A heart attack.  A stroke.  A weakened blood vessel (aneurysm).  Heart failure.  Kidney damage.  Eye damage.  Metabolic syndrome.  Memory and concentration problems. What changes can I make to manage my hypertension? Eating and drinking   Eat a  diet that is high in fiber and potassium, and low in salt (sodium), added sugar, and fat. An example eating plan is called the DASH (Dietary Approaches to Stop Hypertension) diet. To eat this way:  Eat plenty of fresh fruits and vegetables. Try to fill half of your plate at each meal with fruits and vegetables.  Eat whole grains, such as whole wheat pasta, brown rice, or whole grain bread. Fill about one quarter of your plate with whole grains.  Eat low-fat diary products.  Avoid fatty cuts of meat, processed or cured meats, and poultry with skin. Fill about one quarter of your plate with lean proteins such as fish, chicken without skin, beans, eggs, and tofu.  Avoid premade and processed foods. These tend to be higher in sodium, added sugar, and fat.     Lifestyle   Work with your health care provider to maintain a healthy body weight, or to lose weight. Ask what an ideal weight is for you.  Get at least 30 minutes of exercise that causes your heart to beat faster (aerobic exercise) most days of the week. Activities may include walking, swimming, or biking.       Monitoring   Monitor your blood pressure at home as told by your health care provider. Your personal target blood pressure may vary depending on your medical conditions, your age, and other factors.  Have your blood pressure checked regularly, as often as told by your health care provider. Working with your health care provider   Review all the medicines you take with your health care provider because there may be side effects or interactions.  Talk with your health   care provider about your diet, exercise habits, and other lifestyle factors that may be contributing to hypertension.  Visit your health care provider regularly. Your health care provider can help you create and adjust your plan for managing hypertension. Will I need medicine to control my blood pressure? Your health care provider may prescribe medicine if  lifestyle changes are not enough to get your blood pressure under control, and if:  Your systolic blood pressure is 130 or higher.  Your diastolic blood pressure is 80 or higher. Take medicines only as told by your health care provider. Follow the directions carefully. Blood pressure medicines must be taken as prescribed. The medicine does not work as well when you skip doses. Skipping doses also puts you at risk for problems. Contact a health care provider if:  You think you are having a reaction to medicines you have taken.  You have repeated (recurrent) headaches.  You feel dizzy.  You have swelling in your ankles.  You have trouble with your vision. Get help right away if:  You develop a severe headache or confusion.  You have unusual weakness or numbness, or you feel faint.  You have severe pain in your chest or abdomen.  You vomit repeatedly.  You have trouble breathing. Summary  Hypertension is when the force of blood pumping through your arteries is too strong. If this condition is not controlled, it may put you at risk for serious complications.  Your personal target blood pressure may vary depending on your medical conditions, your age, and other factors. For most people, a normal blood pressure is less than 120/80.  Hypertension is managed by lifestyle changes, medicines, or both. Lifestyle changes include weight loss, eating a healthy, low-sodium diet, exercising more, and limiting alcohol. This information is not intended to replace advice given to you by your health care provider. Make sure you discuss any questions you have with your health care provider. Document Released: 04/02/2012 Document Revised: 06/06/2016 Document Reviewed: 06/06/2016 Elsevier Interactive Patient Education  2017 Else[employer] Inc.  

## 2016-11-12 NOTE — Progress Notes (Signed)
Subjective:  Patient ID: Maureen Barnes, female    DOB: May 02, 1961  Age: 56 y.o. MRN: [id number]  CC:   HPI Maureen Barnes is a 56 y.o. female with a PMH of HTN, DM2, and Hep C presents for BP f/u. Patient has been taking anti-hypertensives as directed. Blood pressure is reported well controlled. Only complaint now is of a mild frontal headache attributed to allergies or carvedilol. Says she used to take metoprolol and carvedilol before without headaches. Not taking anything for allergy relief. BP has been doing well since she is began taking all her anti-hypertensives. Otherwise doing well and does not have any other complaints/symptoms.     Outpatient Medications Prior to Visit  Medication Sig Dispense Refill  . acetic acid (VOSOL) 2 % otic solution Place 4 drops into both ears 3 (three) times daily. 15 mL 0  . amLODipine (NORVASC) 10 MG tablet Take 10 mg by mouth daily.      . carvedilol (COREG) 12.5 MG tablet Take 1 tablet (12.5 mg total) by mouth 2 (two) times daily with a meal. 90 tablet 1  . Insulin Glargine (LANTUS SOLOSTAR [place]) Inject 26 Units into the skin daily.    Marland Kitchen losartan-hydrochlorothiazide (HYZAAR) 100-25 MG tablet Take 1 tablet by mouth daily. 90 tablet 3  . phenylephrine (NEO-SYNEPHRINE) 0.5 % nasal solution Place 1 drop into both nostrils every 6 (six) hours as needed for congestion. 15 mL 0  . sodium chloride (OCEAN) 0.65 % SOLN nasal spray Place 1 spray into both nostrils as needed for congestion. 1 Bottle 0   No facility-administered medications prior to visit.      ROS Review of Systems  Constitutional: Negative for chills, fever and malaise/fatigue.  Eyes: Negative for blurred vision.  Respiratory: Negative for shortness of breath.   Cardiovascular: Negative for chest pain and palpitations.  Gastrointestinal: Negative for abdominal pain and nausea.  Genitourinary: Negative for dysuria and hematuria.  Musculoskeletal: Negative for joint pain and myalgias.   Skin: Negative for rash.  Neurological: Negative for tingling and headaches.  Psychiatric/Behavioral: Negative for depression. The patient is not nervous/anxious.     Objective:  BP 133/87 (BP Location: Left Arm, Patient Position: Sitting, Cuff Size: Large)   Pulse 81   Temp 98.1 F (36.7 C) (Oral)   Ht 5\' 3"  (1.6 m)   Wt 170 lb 3.2 oz (77.2 kg)   LMP 07/10/2011   SpO2 100%   BMI 30.15 kg/m   BP/Weight 11/12/2016 10/18/2016 1/75/1025  Systolic BP 852 778 242  Diastolic BP 87 96 95  Wt. (Lbs) 170.2 172.4 172.8  BMI 30.15 30.54 30.61      Physical Exam  Constitutional: She is oriented to person, place, and time.  Well developed, well nourished, NAD, polite  HENT:  Head: Normocephalic and atraumatic.  Eyes: No scleral icterus.  Neck: Normal range of motion. Neck supple. No thyromegaly present.  Cardiovascular: Normal rate, regular rhythm and normal heart sounds.   Pulmonary/Chest: Effort normal and breath sounds normal.  Musculoskeletal: She exhibits no edema.  Neurological: She is alert and oriented to person, place, and time.  Skin: Skin is warm and dry. No rash noted. No erythema. No pallor.  Psychiatric: She has a normal mood and affect. Her behavior is normal. Thought content normal.  Vitals reviewed.    Assessment & Plan:   1. Hypertension, unspecified type -Controlled today on Hyzaar 100-25, Carvedilol 12.5 BID, and Amlodipine 10mg   2. Screening mammogram, encounter for - MM DIGITAL  SCREENING BILATERAL; Future  3. Seasonal allergic rhinitis due to pollen - diphenhydrAMINE (BENADRYL) 25 MG tablet; Take 1 tablet (25 mg total) by mouth at bedtime as needed.  Dispense: 30 tablet; Refill: 0 - sodium chloride (OCEAN) 0.65 % SOLN nasal spray; Place 1 spray into both nostrils as needed for congestion.  Dispense: 1 Bottle; Refill: 0   Meds ordered this encounter  Medications  . diphenhydrAMINE (BENADRYL) 25 MG tablet    Sig: Take 1 tablet (25 mg total) by mouth  at bedtime as needed.    Dispense:  30 tablet    Refill:  0    Order Specific Question:   Supervising Provider    Answer:   Tresa Garter [id number]  . sodium chloride (OCEAN) 0.65 % SOLN nasal spray    Sig: Place 1 spray into both nostrils as needed for congestion.    Dispense:  1 Bottle    Refill:  0    Order Specific Question:   Supervising Provider    Answer:   Tresa Garter [id number]    Follow-up: Return in about 4 months (around 03/14/2017) for HTN.   Clent Demark PA

## 2016-12-28 ENCOUNTER — Telehealth (INDEPENDENT_AMBULATORY_CARE_PROVIDER_SITE_OTHER): Payer: Self-pay | Admitting: Physician Assistant

## 2016-12-28 MED FILL — ONDANSETRON ODT 4 MG TABLET: 4 | 10 days supply | Qty: 20 | Fill #0

## 2016-12-28 MED FILL — ?OMEPRAZOLE DR 20 MG CAPSUL: 20 | 30 days supply | Qty: 30 | Fill #0

## 2016-12-28 NOTE — Telephone Encounter (Signed)
FWD to PCP. Maureen Barnes Maureen Barnes, CMA  

## 2016-12-28 NOTE — Telephone Encounter (Signed)
Please advise to go to urgent care or ED.

## 2016-12-28 NOTE — Telephone Encounter (Signed)
Patient left voicemail this am wants to know if can be seen as walkin today.  Stated has headaches, racing heartbeat, tremors.  Please follow up with patient.

## 2017-01-07 ENCOUNTER — Other Ambulatory Visit: Payer: Self-pay

## 2017-01-07 DIAGNOSIS — N185 Chronic kidney disease, stage 5: Secondary | ICD-10-CM

## 2017-01-22 ENCOUNTER — Encounter: Payer: Self-pay | Admitting: Cardiovascular Disease

## 2017-01-22 ENCOUNTER — Ambulatory Visit (INDEPENDENT_AMBULATORY_CARE_PROVIDER_SITE_OTHER): Payer: No Typology Code available for payment source | Admitting: Cardiovascular Disease

## 2017-01-22 DIAGNOSIS — E785 Hyperlipidemia, unspecified: Secondary | ICD-10-CM | POA: Insufficient documentation

## 2017-01-22 DIAGNOSIS — I10 Essential (primary) hypertension: Secondary | ICD-10-CM

## 2017-01-22 DIAGNOSIS — R0609 Other forms of dyspnea: Secondary | ICD-10-CM

## 2017-01-22 DIAGNOSIS — R002 Palpitations: Secondary | ICD-10-CM | POA: Insufficient documentation

## 2017-01-22 DIAGNOSIS — E7800 Pure hypercholesterolemia, unspecified: Secondary | ICD-10-CM

## 2017-01-22 NOTE — Progress Notes (Signed)
01/22/2017 Maureen Barnes   Jan 19, 1961  [id number]  Primary Physician Wenda Low, MD Primary Cardiologist: Lorretta Harp MD Renae Gloss  HPI:  Maureen Barnes is a 56 year old moderately overweight single African-American female mother of 3 children referred by Dr. Lysle Rubens  for cardiovascular evaluation because of symptomatic palpitations. She has a history of true hypertension, hyperlipidemia and diabetes. She has never smoked. She currently does not work but previously worked as a Cytogeneticist. She's had chronic renal insufficiency for years with anticipation of needing an AV fistula in the near future. She does complain of dyspnea on exertion and some atypical chest pain as well as symptomatic palpitations. She has chronic hepatitis C.   Current Outpatient Prescriptions  Medication Sig Dispense Refill  . amLODipine (NORVASC) 10 MG tablet Take 10 mg by mouth daily.      Marland Kitchen aspirin EC 81 MG tablet Take 81 mg by mouth daily.    . carvedilol (COREG) 12.5 MG tablet Take 1 tablet (12.5 mg total) by mouth 2 (two) times daily with a meal. 90 tablet 1  . cyclobenzaprine (FLEXERIL) 10 MG tablet Take 1 tablet by mouth as directed.    . diphenhydrAMINE (BENADRYL) 25 MG tablet Take 1 tablet (25 mg total) by mouth at bedtime as needed. 30 tablet 0  . ferrous sulfate 325 (65 FE) MG EC tablet Take 325 mg by mouth 2 (two) times daily.    . Insulin Glargine (LANTUS SOLOSTAR [place]) Inject 26 Units into the skin daily.    Marland Kitchen loratadine (CLARITIN) 10 MG tablet Take 10 mg by mouth as directed.    Marland Kitchen losartan-hydrochlorothiazide (HYZAAR) 100-25 MG tablet Take 1 tablet by mouth daily. 90 tablet 3  . pravastatin (PRAVACHOL) 40 MG tablet Take 40 mg by mouth daily.    . SUMAtriptan (IMITREX) 25 MG tablet Take 25 mg by mouth every 2 (two) hours as needed for migraine. May repeat in 2 hours if headache persists or recurs.    . traMADol (ULTRAM) 50 MG tablet Take 50 mg by mouth every 6 (six)  hours as needed.     No current facility-administered medications for this visit.     Allergies  Allergen Reactions  . Iodine Itching and Swelling    Shellfish-lips swell itching.  . Peanut-Containing Drug Products Itching and Swelling  . Shellfish Allergy Itching and Swelling    Social History   Social History  . Marital status: Single    Spouse name: N/A  . Number of children: N/A  . Years of education: N/A   Occupational History  . Not on file.   Social History Main Topics  . Smoking status: Never Smoker  . Smokeless tobacco: Never Used  . Alcohol use No  . Drug use: No  . Sexual activity: Not Currently    Birth control/ protection: Surgical   Other Topics Concern  . Not on file   Social History Narrative  . No narrative on file     Review of Systems: General: negative for chills, fever, night sweats or weight changes.  Cardiovascular: negative for chest pain, dyspnea on exertion, edema, orthopnea, palpitations, paroxysmal nocturnal dyspnea or shortness of breath Dermatological: negative for rash Respiratory: negative for cough or wheezing Urologic: negative for hematuria Abdominal: negative for nausea, vomiting, diarrhea, bright red blood per rectum, melena, or hematemesis Neurologic: negative for visual changes, syncope, or dizziness All other systems reviewed and are otherwise negative except as noted above.  Blood pressure (!) 146/88, pulse 75, height 5\' 3"  (1.6 m), weight 170 lb (77.1 kg), last menstrual period 07/10/2011.  General appearance: alert and no distress Neck: no adenopathy, no carotid bruit, no JVD, supple, symmetrical, trachea midline and thyroid not enlarged, symmetric, no tenderness/mass/nodules Lungs: clear to auscultation bilaterally Heart: regular rate and rhythm, S1, S2 normal, no murmur, click, rub or gallop Extremities: extremities normal, atraumatic, no cyanosis or edema  EKG sinus rhythm at 75 with nonspecific ST-T wave  changes. I personally reviewed this EKG.  ASSESSMENT AND PLAN:   Dyspnea on exertion History of dyspnea on exertion. No history of prior tobacco abuse. Chronic renal insufficiency. We will obtain a 2-D echo to further evaluate  Palpitations History of palpitations last for 5 years occurring on a daily basis which she is symptomatic from. We will obtain a two-week event monitor.  Hyperlipidemia History of hyperlipidemia on statin therapy followed by her PCP  Hypertension History of essential hypertension on carvedilol, amlodipine, losartan and hydrochlorothiazide. Continue current meds at current dosing. Her blood pressure today is 146/88.      Lorretta Harp MD FACP,FACC,FAHA, [facility] 01/22/2017 2:29 PM

## 2017-01-22 NOTE — Assessment & Plan Note (Signed)
History of palpitations last for 5 years occurring on a daily basis which she is symptomatic from. We will obtain a two-week event monitor.

## 2017-01-22 NOTE — Assessment & Plan Note (Signed)
History of essential hypertension on carvedilol, amlodipine, losartan and hydrochlorothiazide. Continue current meds at current dosing. Her blood pressure today is 146/88.

## 2017-01-22 NOTE — Assessment & Plan Note (Signed)
History of hyperlipidemia on statin therapy followed by her PCP. 

## 2017-01-22 NOTE — Patient Instructions (Signed)
Medication Instructions: Your physician recommends that you continue on your current medications as directed. Please refer to the Current Medication list given to you today.   Testing/Procedures: Your physician has requested that you have an echocardiogram. Echocardiography is a painless test that uses sound waves to create images of your heart. It provides your doctor with information about the size and shape of your heart and how well your heart's chambers and valves are working. This procedure takes approximately one hour. There are no restrictions for this procedure.  Your physician has recommended that you wear a 14 day event monitor. Event monitors are medical devices that record the heart's electrical activity. Doctors most often [place] these monitors to diagnose arrhythmias. Arrhythmias are problems with the speed or rhythm of the heartbeat. The monitor is a small, portable device. You can wear one while you do your normal daily activities. This is usually used to diagnose what is causing palpitations/syncope (passing out).  Follow-Up: Your physician recommends that you schedule a follow-up appointment in: 1 month with Dr. Gwenlyn Found.

## 2017-01-22 NOTE — Assessment & Plan Note (Signed)
History of dyspnea on exertion. No history of prior tobacco abuse. Chronic renal insufficiency. We will obtain a 2-D echo to further evaluate

## 2017-01-29 ENCOUNTER — Telehealth: Payer: Self-pay | Admitting: Licensed Clinical Social Worker

## 2017-01-29 NOTE — Telephone Encounter (Addendum)
FYI patient called [employer] to request counseling records for SSI application. [employer] printed counseling records per Ms. Dowda's request. Records did not come out properly. [employer] was told by office manger that [facility] policy was to charge a $5.00 fee for records. [employer] spoke with supervisor after printing records and per instruction called [employer] on maternity leave as well as  double checking on policy and procedures with office manager.  [employer] was told to print records to give to Ms. Hargrove but [employer] was not able to print records through [employer]. [employer] told Ms. Frese that Dr. Amil Amen would print the records for her and someone from the office would call her by next Friday to let her know they were ready for pickup.

## 2017-01-31 ENCOUNTER — Encounter: Payer: Self-pay | Admitting: *Deleted

## 2017-01-31 ENCOUNTER — Ambulatory Visit: Payer: No Typology Code available for payment source

## 2017-01-31 NOTE — Progress Notes (Signed)
Patient ID: Maureen Barnes, female   DOB: 24-Jan-1961, 56 y.o.   MRN: [id number] Patient enrolled for [employer] to mail a 14 day cardiac event monitor to her home pending approval of the [employer]/ [place] Program application.

## 2017-02-04 ENCOUNTER — Other Ambulatory Visit: Payer: Self-pay

## 2017-02-04 ENCOUNTER — Ambulatory Visit (HOSPITAL_COMMUNITY): Payer: No Typology Code available for payment source | Attending: Cardiovascular Disease

## 2017-02-04 DIAGNOSIS — R002 Palpitations: Secondary | ICD-10-CM

## 2017-02-04 DIAGNOSIS — I340 Nonrheumatic mitral (valve) insufficiency: Secondary | ICD-10-CM | POA: Insufficient documentation

## 2017-02-04 DIAGNOSIS — R0609 Other forms of dyspnea: Secondary | ICD-10-CM

## 2017-02-11 ENCOUNTER — Encounter: Payer: Self-pay | Admitting: Vascular Surgery

## 2017-02-11 ENCOUNTER — Other Ambulatory Visit: Payer: Self-pay | Admitting: Obstetrics and Gynecology

## 2017-02-11 DIAGNOSIS — Z1231 Encounter for screening mammogram for malignant neoplasm of breast: Secondary | ICD-10-CM

## 2017-02-20 ENCOUNTER — Ambulatory Visit (INDEPENDENT_AMBULATORY_CARE_PROVIDER_SITE_OTHER): Payer: No Typology Code available for payment source

## 2017-02-20 DIAGNOSIS — R0609 Other forms of dyspnea: Secondary | ICD-10-CM

## 2017-02-20 DIAGNOSIS — R002 Palpitations: Secondary | ICD-10-CM

## 2017-02-21 ENCOUNTER — Ambulatory Visit: Payer: No Typology Code available for payment source

## 2017-02-21 ENCOUNTER — Encounter (HOSPITAL_COMMUNITY): Payer: Self-pay

## 2017-02-21 ENCOUNTER — Ambulatory Visit
Admission: RE | Admit: 2017-02-21 | Discharge: 2017-02-21 | Disposition: A | Discharge status: Home / Self Care | Payer: Self-pay | Source: Ambulatory Visit | Attending: Obstetrics and Gynecology | Admitting: Obstetrics and Gynecology

## 2017-02-21 ENCOUNTER — Ambulatory Visit (HOSPITAL_COMMUNITY)
Admission: RE | Admit: 2017-02-21 | Discharge: 2017-02-21 | Disposition: A | Discharge status: Home / Self Care | Payer: No Typology Code available for payment source | Source: Ambulatory Visit | Attending: Obstetrics and Gynecology | Admitting: Obstetrics and Gynecology

## 2017-02-21 VITALS — BP 153/91 | HR 68 | Temp 98.2°F | Ht 64.0 in | Wt 170.0 lb

## 2017-02-21 DIAGNOSIS — Z1231 Encounter for screening mammogram for malignant neoplasm of breast: Secondary | ICD-10-CM

## 2017-02-21 DIAGNOSIS — Z1239 Encounter for other screening for malignant neoplasm of breast: Secondary | ICD-10-CM

## 2017-02-21 NOTE — Progress Notes (Signed)
No complaints today.   Pap Smear: Pap smear not completed today. Last Pap smear was 05/26/2014 at [facility] and normal with negative HPV. Per patient has a history of an abnormal Pap smear 15 years ago and unsure what follow up was completed. Patient stated she has had more than three normal Pap smears since abnormal. Last Pap smear result is in EPIC.  Physical exam: Breasts Breasts symmetrical. No skin abnormalities bilateral breasts. No nipple retraction bilateral breasts. No nipple discharge bilateral breasts. No lymphadenopathy. No lumps palpated bilateral breasts. No complaints of pain or tenderness on exam. Referred patient to the [facility] for a screening mammogram. Appointment scheduled for Thursday, February 21, 2017 at 1440.      Pelvic/Bimanual No Pap smear completed today since last Pap smear and HPV typing was 05/26/2014. Pap smear not indicated per BCCCP guidelines.   Smoking History: Patient has never smoked.  Patient Navigation: Patient education provided. Access to services provided for patient through [facility] program.   Colorectal Cancer Screening: Per patient had a colonoscopy completed in 2016. No complaints today. FIT Test given to patient to complete and return to B[facility]CP.

## 2017-02-21 NOTE — Patient Instructions (Signed)
Explained breast self awareness with Lajuana Ripple. Patient did not need a Pap smear today due to last Pap smear and HPV typing was 05/26/2014. Let her know BCCCP will cover Pap smears and HPV typing every 5 years unless has a history of abnormal Pap smears. Referred patient to the [facility] for a screening mammogram. Appointment scheduled for Thursday, February 21, 2017 at 1440. Let patient know the [facility] will follow up with her within the next couple weeks with results of mammogram by letter or phone. Lajuana Ripple verbalized understanding.  Jennessy Sandridge, Arvil Chaco, RN 4:06 PM

## 2017-02-22 ENCOUNTER — Encounter: Payer: Self-pay | Admitting: Cardiovascular Disease

## 2017-02-22 ENCOUNTER — Ambulatory Visit (INDEPENDENT_AMBULATORY_CARE_PROVIDER_SITE_OTHER): Payer: No Typology Code available for payment source | Admitting: Cardiovascular Disease

## 2017-02-22 DIAGNOSIS — R0609 Other forms of dyspnea: Secondary | ICD-10-CM

## 2017-02-22 DIAGNOSIS — R002 Palpitations: Secondary | ICD-10-CM

## 2017-02-22 NOTE — Assessment & Plan Note (Signed)
History of dyspnea on exertion with recent 2-D echo performed 02/04/17 which was entirely normal other than grade 2 diastolic dysfunction.

## 2017-02-22 NOTE — Patient Instructions (Signed)
Medication Instructions:  Your physician recommends that you continue on your current medications as directed. Please refer to the Current Medication list given to you today.  Labwork: None   Testing/Procedures: None   Follow-Up: Your physician recommends that you schedule a follow-up appointment in: 2 MONTHS WITH DR Gwenlyn Found AFTER MONITOR COMPLETION   Any Other Special Instructions Will Be Listed Below (If Applicable).  If you need a refill on your cardiac medications before your next appointment, please call your pharmacy.

## 2017-02-22 NOTE — Progress Notes (Signed)
Maureen Barnes returns today for follow-up of her 2-D echo which was entirely normal other than grade 2 diastolic dysfunction. She continues to complain of palpitations. She is currently wearing an event monitor which is several days into a 30 day monitor. I will see her back in 2 months to review the results and make further recommendations.

## 2017-02-22 NOTE — Assessment & Plan Note (Signed)
History of ongoing palpitations still wearing an event monitor. She does not drink caffeine.

## 2017-02-26 ENCOUNTER — Ambulatory Visit (INDEPENDENT_AMBULATORY_CARE_PROVIDER_SITE_OTHER)
Admission: RE | Admit: 2017-02-26 | Discharge: 2017-02-26 | Disposition: A | Discharge status: Home / Self Care | Payer: No Typology Code available for payment source | Source: Ambulatory Visit | Attending: Vascular Surgery | Admitting: Vascular Surgery

## 2017-02-26 ENCOUNTER — Ambulatory Visit (HOSPITAL_COMMUNITY)
Admission: RE | Admit: 2017-02-26 | Discharge: 2017-02-26 | Disposition: A | Discharge status: Home / Self Care | Payer: No Typology Code available for payment source | Source: Ambulatory Visit | Attending: Vascular Surgery | Admitting: Vascular Surgery

## 2017-02-26 ENCOUNTER — Encounter: Payer: Self-pay | Admitting: Vascular Surgery

## 2017-02-26 ENCOUNTER — Ambulatory Visit (INDEPENDENT_AMBULATORY_CARE_PROVIDER_SITE_OTHER): Payer: No Typology Code available for payment source | Admitting: Vascular Surgery

## 2017-02-26 VITALS — BP 142/89 | HR 71 | Temp 97.9°F | Resp 16 | Ht 64.0 in | Wt 169.0 lb

## 2017-02-26 DIAGNOSIS — N185 Chronic kidney disease, stage 5: Secondary | ICD-10-CM | POA: Insufficient documentation

## 2017-02-26 DIAGNOSIS — N184 Chronic kidney disease, stage 4 (severe): Secondary | ICD-10-CM

## 2017-02-26 DIAGNOSIS — Z01818 Encounter for other preprocedural examination: Secondary | ICD-10-CM | POA: Insufficient documentation

## 2017-02-26 NOTE — Progress Notes (Signed)
HISTORY AND PHYSICAL     CC:  Dialysis access Requesting Provider:  Clent Demark, PA-C  HPI: [name] with CKD 4 referred by Dr. Arty Baumgartner for evaluation for dialysis access.  She has a hx of diabetes, which she takes insulin.  She has hypertension and is treated with a beta blocker, ARB, and CCB.    She states that she has had some chest pain/pressure recently as well as shortness of breath lying flat and with activity and is followed by Dr. Gwenlyn Found for this.  She states that she saw him Friday and has been placed on a heart monitor for 15 days.  She states she had an echocardiogram and the results were okay.   She is on a statin for cholesterol management.  She states she does have some sudden numbness/weakness on either side of the body (different sides at different times).  She denies any speech difficulty.   She does have a hx of hepatitis.  Per Dr. Birdie Riddle note, she was treated for 28 weeks with the viral load becoming undetectable and liver bx without any fibrosis and that her therapy was completed in 2012.   She states that she does get some occasional leg cramping.  She states that she sometimes has pain in her feet and her right hip when she walks.  There is no cramping in her calves associated with this or a particular distance.    Past Medical History:  Diagnosis Date  . Anemia   . Diabetes mellitus   . Hep C w/o coma, chronic ([place])   . Hypertension     Past Surgical History:  Procedure Laterality Date  . TUBAL LIGATION      Allergies  Allergen Reactions  . Iodine Itching and Swelling    Shellfish-lips swell itching.  . Peanut-Containing Drug Products Itching and Swelling  . Shellfish Allergy Itching and Swelling    Current Outpatient Prescriptions  Medication Sig Dispense Refill  . amLODipine (NORVASC) 10 MG tablet Take 10 mg by mouth daily.      Marland Kitchen aspirin EC 81 MG tablet Take 81 mg by mouth daily.    . carvedilol (COREG) 12.5 MG tablet  Take 1 tablet (12.5 mg total) by mouth 2 (two) times daily with a meal. 90 tablet 1  . cyclobenzaprine (FLEXERIL) 10 MG tablet Take 1 tablet by mouth as directed.    . diphenhydrAMINE (BENADRYL) 25 MG tablet Take 1 tablet (25 mg total) by mouth at bedtime as needed. 30 tablet 0  . ferrous sulfate 325 (65 FE) MG EC tablet Take 325 mg by mouth 2 (two) times daily.    . Insulin Glargine (LANTUS SOLOSTAR ) Inject 26 Units into the skin daily.    Marland Kitchen loratadine (CLARITIN) 10 MG tablet Take 10 mg by mouth as directed.    Marland Kitchen losartan-hydrochlorothiazide (HYZAAR) 100-25 MG tablet Take 1 tablet by mouth daily. 90 tablet 3  . pravastatin (PRAVACHOL) 40 MG tablet Take 40 mg by mouth daily.    . SUMAtriptan (IMITREX) 25 MG tablet Take 25 mg by mouth every 2 (two) hours as needed for migraine. May repeat in 2 hours if headache persists or recurs.    . traMADol (ULTRAM) 50 MG tablet Take 50 mg by mouth every 6 (six) hours as needed.     No current facility-administered medications for this visit.     Family History  Problem Relation Age of Onset  . Hypertension Mother   . Cancer  Mother   . Diabetes Father   . Hypertension Father     Social History   Social History  . Marital status: Single    Spouse name: N/A  . Number of children: N/A  . Years of education: N/A   Occupational History  . Not on file.   Social History Main Topics  . Smoking status: Never Smoker  . Smokeless tobacco: Never Used  . Alcohol use No  . Drug use: No  . Sexual activity: Not Currently    Birth control/ protection: Surgical   Other Topics Concern  . Not on file   Social History Narrative  . No narrative on file     ROS: [x]  Positive   [ ]  Negative   [ ]  All sytems reviewed and are negative  Cardiac: [x]  chest pain/pressure [x]  palpitations [x]  SOB lying flat [x]  DOE  Vascular: []  pain in legs while walking [x]  pain in feet that wake you at night []  hx of DVT []  hx of phlebitis []  swelling in  legs []  varicose veins  Pulmonary: []  productive cough []  asthma []  wheezing  Neurologic: [x]  weakness in bilateral legs & arms [x]  numbness in bilateral legs and arms [] difficulty speaking or slurred speech []  temporary loss of vision in one eye [x]  dizziness  Hematologic: []  bleeding problems []  problems with blood clotting easily  GI []  vomiting blood []  blood in stool [x]  hx hepatitis  GU: [x]  CKD/renal failure  []  HD---[]  M/W/F []  T/T/F []  burning with urination []  blood in urine  Psychiatric: []  hx of major depression  Integumentary: []  rashes []  ulcers  Constitutional: []  fever []  chills  PHYSICAL EXAMINATION:  Vitals:   02/26/17 1305  BP: (!) 142/89  Pulse: 71  Resp: 16  Temp: 97.9 F (36.6 C)   Body mass index is 29.01 kg/m.  General:  WDWN in NAD Gait: Not observed HENT: WNL Pulmonary: normal non-labored breathing , without Rales, rhonchi,  wheezing Cardiac: regular, without  Murmurs, rubs or gallops; without carotid bruits Skin: without rashes, without ulcers  Vascular Exam/Pulses:   Right Left  Radial 2+ (normal) 2+ (normal)  Ulnar 1+ (weak) 1+ (weak)  Popliteal Unable to palpate  Unable to palpate   DP 2+ (normal) 2+ (normal)  PT Unable to palpate  Unable to palpate    Extremities:  without ischemic changes, without Gangrene, without cellulitis; without open wounds; +leg swelling bilaterally Musculoskeletal: no muscle wasting or atrophy  Neurologic: A&O X 3; Moving all extremities equally;  Speech is fluent/normal  Non-Invasive Vascular Imaging:   Upper Extremity Vein Mapping on 03/27/61: Right Cephalic:  1.30QM-5.78IO Right basilic:  9.62XB-2.84XL  Left Cephalic:  2.44WN-0.27OZ Left basilic:  3.66YQ-0.34VQ  Upper extremity arterial duplex evaluation 02/26/17: Right brachial:  0.41 (T) Right radial:  0.27 (T) Right ulnar 0.15cm (T) Allen's test--decreased with ulnar compression  Left brachial:  0.45 (T) Left Radial:   0.24 (T) Left ulnar:  0.16 (T)  Allen's test:  WNL   ASSESSMENT/PLAN: 56 y.o. female with CKD 4 here for evaluation for hemodialysis access   - pt's veins are small on vein mapping today.  Dr. Donnetta Hutching looked again with the sonosite and confirmed that her veins are too small for a fistula.  She would need an AV graft.  She will see Dr. Arty Baumgartner on 8/23 for follow up.   Since she is not on HD at this time and needs a graft, will defer to DR. Colodonato for timing of placement of  graft.   -pt also wants consultation for PD options as well.  -she is also wearing a heart monitor that was placed on Friday and will wear for 2 weeks.  Will defer surgery until after she is cleared from cardiology.   Leontine Locket, PA-C [facility] [phone]  Clinic MD:   Pt seen and examined with Dr. Donnetta Hutching.    I have examined the patient, reviewed and agree with above. I reimaged her veins with SonoSite ultrasound as well as reviewed the official noninvasive studies. She has extremely small surface veins bilaterally. She is right-handed. Have recommended left arm AV graft. Will defer this until further evaluation with Dr. Marval Regal since she is not a fistula candidate.  Curt Jews, MD 02/26/2017 2:20 PM

## 2017-03-11 ENCOUNTER — Ambulatory Visit (INDEPENDENT_AMBULATORY_CARE_PROVIDER_SITE_OTHER): Payer: No Typology Code available for payment source | Admitting: Physician Assistant

## 2017-03-21 NOTE — Discharge Instructions (Signed)

## 2017-03-22 ENCOUNTER — Ambulatory Visit (HOSPITAL_COMMUNITY)
Admission: RE | Admit: 2017-03-22 | Discharge: 2017-03-22 | Disposition: A | Discharge status: Home / Self Care | Payer: Medicaid Other | Source: Ambulatory Visit | Attending: Nephrology | Admitting: Nephrology

## 2017-03-22 DIAGNOSIS — N183 Chronic kidney disease, stage 3 unspecified: Secondary | ICD-10-CM

## 2017-03-22 DIAGNOSIS — N185 Chronic kidney disease, stage 5: Secondary | ICD-10-CM | POA: Diagnosis not present

## 2017-03-22 DIAGNOSIS — D631 Anemia in chronic kidney disease: Secondary | ICD-10-CM | POA: Insufficient documentation

## 2017-03-22 LAB — POCT HEMOGLOBIN-HEMACUE: Hemoglobin: 9.3 g/dL — ABNORMAL LOW (ref 12.0–15.0)

## 2017-03-22 MED ORDER — DARBEPOETIN ALFA 60 MCG/0.3ML IJ SOSY
PREFILLED_SYRINGE | INTRAMUSCULAR | Status: AC
  Administered 2017-03-22: 60 ug via SUBCUTANEOUS
  Filled 2017-03-22: qty 0.3

## 2017-03-22 MED ORDER — DARBEPOETIN ALFA 60 MCG/0.3ML IJ SOSY
60.0000 ug | PREFILLED_SYRINGE | INTRAMUSCULAR | Status: DC
  Administered 2017-03-22: 60 ug via SUBCUTANEOUS

## 2017-04-19 ENCOUNTER — Ambulatory Visit (HOSPITAL_COMMUNITY)
Admission: RE | Admit: 2017-04-19 | Discharge: 2017-04-19 | Disposition: A | Discharge status: Home / Self Care | Payer: Medicaid Other | Source: Ambulatory Visit | Attending: Nephrology | Admitting: Nephrology

## 2017-04-19 DIAGNOSIS — N185 Chronic kidney disease, stage 5: Secondary | ICD-10-CM | POA: Insufficient documentation

## 2017-04-19 DIAGNOSIS — N183 Chronic kidney disease, stage 3 unspecified: Secondary | ICD-10-CM

## 2017-04-19 DIAGNOSIS — D631 Anemia in chronic kidney disease: Secondary | ICD-10-CM | POA: Diagnosis not present

## 2017-04-19 LAB — CBC
HCT: 29.7 % — ABNORMAL LOW (ref 36.0–46.0)
HEMOGLOBIN: 9.6 g/dL — AB (ref 12.0–15.0)
MCH: 29.8 pg (ref 26.0–34.0)
MCHC: 32.3 g/dL (ref 30.0–36.0)
MCV: 92.2 fL (ref 78.0–100.0)
Platelets: 290 10*3/uL (ref 150–400)
RBC: 3.22 MIL/uL — ABNORMAL LOW (ref 3.87–5.11)
RDW: 13.2 % (ref 11.5–15.5)
WBC: 5.9 10*3/uL (ref 4.0–10.5)

## 2017-04-19 LAB — COMPREHENSIVE METABOLIC PANEL
ALT: 12 U/L — AB (ref 14–54)
ANION GAP: 9 (ref 5–15)
AST: 15 U/L (ref 15–41)
Albumin: 4 g/dL (ref 3.5–5.0)
Alkaline Phosphatase: 64 U/L (ref 38–126)
BUN: 48 mg/dL — ABNORMAL HIGH (ref 6–20)
CALCIUM: 9.6 mg/dL (ref 8.9–10.3)
CO2: 27 mmol/L (ref 22–32)
CREATININE: 4.52 mg/dL — AB (ref 0.44–1.00)
Chloride: 103 mmol/L (ref 101–111)
GFR, EST AFRICAN AMERICAN: 12 mL/min — AB (ref >60)
GFR, EST NON AFRICAN AMERICAN: 10 mL/min — AB (ref >60)
Glucose, Bld: 103 mg/dL — ABNORMAL HIGH (ref 65–99)
Potassium: 5 mmol/L (ref 3.5–5.1)
SODIUM: 139 mmol/L (ref 135–145)
Total Bilirubin: 0.6 mg/dL (ref 0.3–1.2)
Total Protein: 7.5 g/dL (ref 6.5–8.1)

## 2017-04-19 LAB — PHOSPHORUS: PHOSPHORUS: 4.7 mg/dL — AB (ref 2.5–4.6)

## 2017-04-19 MED ORDER — DARBEPOETIN ALFA 60 MCG/0.3ML IJ SOSY
60.0000 ug | PREFILLED_SYRINGE | INTRAMUSCULAR | Status: DC
  Administered 2017-04-19: 60 ug via SUBCUTANEOUS

## 2017-04-19 MED ORDER — DARBEPOETIN ALFA 60 MCG/0.3ML IJ SOSY
PREFILLED_SYRINGE | INTRAMUSCULAR | Status: AC
  Filled 2017-04-19: qty 0.3

## 2017-04-24 ENCOUNTER — Ambulatory Visit (INDEPENDENT_AMBULATORY_CARE_PROVIDER_SITE_OTHER): Payer: No Typology Code available for payment source | Admitting: Cardiovascular Disease

## 2017-04-24 ENCOUNTER — Encounter: Payer: Self-pay | Admitting: Cardiovascular Disease

## 2017-04-24 DIAGNOSIS — R002 Palpitations: Secondary | ICD-10-CM

## 2017-04-24 MED ORDER — CARVEDILOL 12.5 MG PO TABS: 18.7500 mg | ORAL_TABLET | Freq: Two times a day (BID) | ORAL | 6 refills | Status: DC

## 2017-04-24 NOTE — Patient Instructions (Signed)
Medication Instructions: Increase Carvedilol to 18.75 mg (1 & 1/2 tablets) twice daily.   Follow-Up: We request that you follow-up in: 3 months with an extender and in 6 months with Dr Andria Rhein will receive a reminder letter in the mail two months in advance. If you don't receive a letter, please call our office to schedule the follow-up appointment.  If you need a refill on your cardiac medications before your next appointment, please call your pharmacy.

## 2017-04-24 NOTE — Assessment & Plan Note (Signed)
Maureen Barnes returns for follow-up of her event monitor performed in evaluation of palpitations. It did show PACs. She is on carvedilol 12.5 mg by mouth twice a day which under Dr. titrate 18.75 mg twice a day and will have her seen back in 3 months by mid-level provider and by me in 6 months. She could potentially be titrated up to 25 mg by mouth twice a day for treatment of PACs if her blood pressure allowed.

## 2017-04-24 NOTE — Progress Notes (Signed)
Maureen Barnes returns for follow-up of her event monitor performed in evaluation of palpitations. It did show PACs. She is on carvedilol 12.5 mg by mouth twice a day which under Dr. titrate 18.75 mg twice a day and will have her seen back in 3 months by mid-level provider and by me in 6 months. She could potentially be titrated up to 25 mg by mouth twice a day for treatment of PACs if her blood pressure allowed.  Lorretta Harp, M.D., [place], [facility], [place] [address] [place], [place]  [address]  [phone] 04/24/2017 2:04 PM

## 2017-04-26 DIAGNOSIS — N19 Unspecified kidney failure: Secondary | ICD-10-CM | POA: Diagnosis not present

## 2017-04-26 DIAGNOSIS — N189 Chronic kidney disease, unspecified: Secondary | ICD-10-CM | POA: Diagnosis not present

## 2017-04-26 DIAGNOSIS — Z992 Dependence on renal dialysis: Secondary | ICD-10-CM | POA: Diagnosis not present

## 2017-04-26 DIAGNOSIS — E8770 Fluid overload, unspecified: Secondary | ICD-10-CM | POA: Diagnosis not present

## 2017-05-16 DIAGNOSIS — E1129 Type 2 diabetes mellitus with other diabetic kidney complication: Secondary | ICD-10-CM | POA: Diagnosis not present

## 2017-05-16 DIAGNOSIS — D631 Anemia in chronic kidney disease: Secondary | ICD-10-CM | POA: Diagnosis not present

## 2017-05-16 DIAGNOSIS — R801 Persistent proteinuria, unspecified: Secondary | ICD-10-CM | POA: Diagnosis not present

## 2017-05-16 DIAGNOSIS — B182 Chronic viral hepatitis C: Secondary | ICD-10-CM | POA: Diagnosis not present

## 2017-05-16 DIAGNOSIS — N185 Chronic kidney disease, stage 5: Secondary | ICD-10-CM | POA: Diagnosis not present

## 2017-05-16 DIAGNOSIS — I10 Essential (primary) hypertension: Secondary | ICD-10-CM | POA: Diagnosis not present

## 2017-05-16 DIAGNOSIS — E669 Obesity, unspecified: Secondary | ICD-10-CM | POA: Diagnosis not present

## 2017-05-16 DIAGNOSIS — N2581 Secondary hyperparathyroidism of renal origin: Secondary | ICD-10-CM | POA: Diagnosis not present

## 2017-05-16 DIAGNOSIS — R002 Palpitations: Secondary | ICD-10-CM | POA: Diagnosis not present

## 2017-05-17 ENCOUNTER — Ambulatory Visit (HOSPITAL_COMMUNITY)
Admission: RE | Admit: 2017-05-17 | Discharge: 2017-05-17 | Disposition: A | Discharge status: Home / Self Care | Payer: No Typology Code available for payment source | Source: Ambulatory Visit | Attending: Nephrology | Admitting: Nephrology

## 2017-05-17 DIAGNOSIS — N183 Chronic kidney disease, stage 3 unspecified: Secondary | ICD-10-CM

## 2017-05-17 DIAGNOSIS — N185 Chronic kidney disease, stage 5: Secondary | ICD-10-CM | POA: Insufficient documentation

## 2017-05-17 DIAGNOSIS — D631 Anemia in chronic kidney disease: Secondary | ICD-10-CM

## 2017-05-17 LAB — COMPREHENSIVE METABOLIC PANEL
ALT: 10 U/L — AB (ref 14–54)
AST: 18 U/L (ref 15–41)
Albumin: 3.9 g/dL (ref 3.5–5.0)
Alkaline Phosphatase: 76 U/L (ref 38–126)
Anion gap: 9 (ref 5–15)
BILIRUBIN TOTAL: 0.4 mg/dL (ref 0.3–1.2)
BUN: 43 mg/dL — AB (ref 6–20)
CO2: 25 mmol/L (ref 22–32)
CREATININE: 4.19 mg/dL — AB (ref 0.44–1.00)
Calcium: 9.4 mg/dL (ref 8.9–10.3)
Chloride: 107 mmol/L (ref 101–111)
GFR, EST AFRICAN AMERICAN: 13 mL/min — AB (ref >60)
GFR, EST NON AFRICAN AMERICAN: 11 mL/min — AB (ref >60)
Glucose, Bld: 105 mg/dL — ABNORMAL HIGH (ref 65–99)
POTASSIUM: 3.8 mmol/L (ref 3.5–5.1)
Sodium: 141 mmol/L (ref 135–145)
TOTAL PROTEIN: 7.7 g/dL (ref 6.5–8.1)

## 2017-05-17 LAB — CBC
HCT: 31.3 % — ABNORMAL LOW (ref 36.0–46.0)
Hemoglobin: 10.1 g/dL — ABNORMAL LOW (ref 12.0–15.0)
MCH: 29.8 pg (ref 26.0–34.0)
MCHC: 32.3 g/dL (ref 30.0–36.0)
MCV: 92.3 fL (ref 78.0–100.0)
PLATELETS: 278 10*3/uL (ref 150–400)
RBC: 3.39 MIL/uL — ABNORMAL LOW (ref 3.87–5.11)
RDW: 13.3 % (ref 11.5–15.5)
WBC: 4.6 10*3/uL (ref 4.0–10.5)

## 2017-05-17 LAB — FERRITIN: FERRITIN: 77 ng/mL (ref 11–307)

## 2017-05-17 LAB — PHOSPHORUS: PHOSPHORUS: 4.2 mg/dL (ref 2.5–4.6)

## 2017-05-17 LAB — IRON AND TIBC
IRON: 68 ug/dL (ref 28–170)
Saturation Ratios: 27 % (ref 10.4–31.8)
TIBC: 252 ug/dL (ref 250–450)
UIBC: 184 ug/dL

## 2017-05-17 MED ORDER — DARBEPOETIN ALFA 60 MCG/0.3ML IJ SOSY
60.0000 ug | PREFILLED_SYRINGE | INTRAMUSCULAR | Status: DC
  Administered 2017-05-17: 60 ug via SUBCUTANEOUS

## 2017-05-17 MED ORDER — DARBEPOETIN ALFA 60 MCG/0.3ML IJ SOSY
PREFILLED_SYRINGE | INTRAMUSCULAR | Status: AC
  Administered 2017-05-17: 60 ug via SUBCUTANEOUS
  Filled 2017-05-17: qty 0.3

## 2017-05-18 LAB — HEPATITIS B SURFACE ANTIGEN: HEP B S AG: NEGATIVE

## 2017-05-20 DIAGNOSIS — D509 Iron deficiency anemia, unspecified: Secondary | ICD-10-CM | POA: Diagnosis not present

## 2017-05-20 DIAGNOSIS — N2589 Other disorders resulting from impaired renal tubular function: Secondary | ICD-10-CM | POA: Diagnosis not present

## 2017-05-20 DIAGNOSIS — R17 Unspecified jaundice: Secondary | ICD-10-CM | POA: Diagnosis not present

## 2017-05-20 DIAGNOSIS — N2581 Secondary hyperparathyroidism of renal origin: Secondary | ICD-10-CM | POA: Diagnosis not present

## 2017-05-20 DIAGNOSIS — E440 Moderate protein-calorie malnutrition: Secondary | ICD-10-CM | POA: Diagnosis not present

## 2017-05-20 DIAGNOSIS — K769 Liver disease, unspecified: Secondary | ICD-10-CM | POA: Diagnosis not present

## 2017-05-20 DIAGNOSIS — R82994 Hypercalciuria: Secondary | ICD-10-CM | POA: Diagnosis not present

## 2017-05-20 DIAGNOSIS — D513 Other dietary vitamin B12 deficiency anemia: Secondary | ICD-10-CM | POA: Diagnosis not present

## 2017-05-20 DIAGNOSIS — Z79899 Other long term (current) drug therapy: Secondary | ICD-10-CM | POA: Diagnosis not present

## 2017-05-20 DIAGNOSIS — Z23 Encounter for immunization: Secondary | ICD-10-CM | POA: Diagnosis not present

## 2017-05-20 DIAGNOSIS — N186 End stage renal disease: Secondary | ICD-10-CM | POA: Diagnosis not present

## 2017-05-21 DIAGNOSIS — N186 End stage renal disease: Secondary | ICD-10-CM | POA: Diagnosis not present

## 2017-05-21 DIAGNOSIS — D509 Iron deficiency anemia, unspecified: Secondary | ICD-10-CM | POA: Diagnosis not present

## 2017-05-21 DIAGNOSIS — E440 Moderate protein-calorie malnutrition: Secondary | ICD-10-CM | POA: Diagnosis not present

## 2017-05-21 DIAGNOSIS — N2581 Secondary hyperparathyroidism of renal origin: Secondary | ICD-10-CM | POA: Diagnosis not present

## 2017-05-21 DIAGNOSIS — R17 Unspecified jaundice: Secondary | ICD-10-CM | POA: Diagnosis not present

## 2017-05-21 DIAGNOSIS — Z79899 Other long term (current) drug therapy: Secondary | ICD-10-CM | POA: Diagnosis not present

## 2017-05-22 DIAGNOSIS — D509 Iron deficiency anemia, unspecified: Secondary | ICD-10-CM | POA: Diagnosis not present

## 2017-05-22 DIAGNOSIS — E440 Moderate protein-calorie malnutrition: Secondary | ICD-10-CM | POA: Diagnosis not present

## 2017-05-22 DIAGNOSIS — N186 End stage renal disease: Secondary | ICD-10-CM | POA: Diagnosis not present

## 2017-05-22 DIAGNOSIS — N2581 Secondary hyperparathyroidism of renal origin: Secondary | ICD-10-CM | POA: Diagnosis not present

## 2017-05-22 DIAGNOSIS — R17 Unspecified jaundice: Secondary | ICD-10-CM | POA: Diagnosis not present

## 2017-05-22 DIAGNOSIS — Z79899 Other long term (current) drug therapy: Secondary | ICD-10-CM | POA: Diagnosis not present

## 2017-05-24 DIAGNOSIS — K769 Liver disease, unspecified: Secondary | ICD-10-CM | POA: Diagnosis not present

## 2017-05-24 DIAGNOSIS — D631 Anemia in chronic kidney disease: Secondary | ICD-10-CM | POA: Diagnosis not present

## 2017-05-24 DIAGNOSIS — D509 Iron deficiency anemia, unspecified: Secondary | ICD-10-CM | POA: Diagnosis not present

## 2017-05-24 DIAGNOSIS — E440 Moderate protein-calorie malnutrition: Secondary | ICD-10-CM | POA: Diagnosis not present

## 2017-05-24 DIAGNOSIS — R17 Unspecified jaundice: Secondary | ICD-10-CM | POA: Diagnosis not present

## 2017-05-24 DIAGNOSIS — N2581 Secondary hyperparathyroidism of renal origin: Secondary | ICD-10-CM | POA: Diagnosis not present

## 2017-05-24 DIAGNOSIS — Z4932 Encounter for adequacy testing for peritoneal dialysis: Secondary | ICD-10-CM | POA: Diagnosis not present

## 2017-05-24 DIAGNOSIS — N186 End stage renal disease: Secondary | ICD-10-CM | POA: Diagnosis not present

## 2017-05-24 DIAGNOSIS — Z79899 Other long term (current) drug therapy: Secondary | ICD-10-CM | POA: Diagnosis not present

## 2017-05-27 DIAGNOSIS — N2581 Secondary hyperparathyroidism of renal origin: Secondary | ICD-10-CM | POA: Diagnosis not present

## 2017-05-27 DIAGNOSIS — D631 Anemia in chronic kidney disease: Secondary | ICD-10-CM | POA: Diagnosis not present

## 2017-05-27 DIAGNOSIS — R17 Unspecified jaundice: Secondary | ICD-10-CM | POA: Diagnosis not present

## 2017-05-27 DIAGNOSIS — R82994 Hypercalciuria: Secondary | ICD-10-CM | POA: Diagnosis not present

## 2017-05-27 DIAGNOSIS — N186 End stage renal disease: Secondary | ICD-10-CM | POA: Diagnosis not present

## 2017-05-27 DIAGNOSIS — D509 Iron deficiency anemia, unspecified: Secondary | ICD-10-CM | POA: Diagnosis not present

## 2017-05-27 DIAGNOSIS — Z79899 Other long term (current) drug therapy: Secondary | ICD-10-CM | POA: Diagnosis not present

## 2017-05-28 DIAGNOSIS — Z79899 Other long term (current) drug therapy: Secondary | ICD-10-CM | POA: Diagnosis not present

## 2017-05-28 DIAGNOSIS — D631 Anemia in chronic kidney disease: Secondary | ICD-10-CM | POA: Diagnosis not present

## 2017-05-28 DIAGNOSIS — D509 Iron deficiency anemia, unspecified: Secondary | ICD-10-CM | POA: Diagnosis not present

## 2017-05-28 DIAGNOSIS — N186 End stage renal disease: Secondary | ICD-10-CM | POA: Diagnosis not present

## 2017-05-28 DIAGNOSIS — N2581 Secondary hyperparathyroidism of renal origin: Secondary | ICD-10-CM | POA: Diagnosis not present

## 2017-05-28 DIAGNOSIS — R17 Unspecified jaundice: Secondary | ICD-10-CM | POA: Diagnosis not present

## 2017-05-29 ENCOUNTER — Ambulatory Visit
Admission: RE | Admit: 2017-05-29 | Discharge: 2017-05-29 | Disposition: A | Discharge status: Home / Self Care | Payer: No Typology Code available for payment source | Source: Ambulatory Visit | Attending: Nephrology | Admitting: Nephrology

## 2017-05-29 ENCOUNTER — Other Ambulatory Visit: Payer: Self-pay | Admitting: Nephrology

## 2017-05-29 DIAGNOSIS — R52 Pain, unspecified: Secondary | ICD-10-CM

## 2017-05-30 DIAGNOSIS — Z79899 Other long term (current) drug therapy: Secondary | ICD-10-CM | POA: Diagnosis not present

## 2017-05-30 DIAGNOSIS — R17 Unspecified jaundice: Secondary | ICD-10-CM | POA: Diagnosis not present

## 2017-05-30 DIAGNOSIS — N186 End stage renal disease: Secondary | ICD-10-CM | POA: Diagnosis not present

## 2017-05-30 DIAGNOSIS — D509 Iron deficiency anemia, unspecified: Secondary | ICD-10-CM | POA: Diagnosis not present

## 2017-05-30 DIAGNOSIS — D631 Anemia in chronic kidney disease: Secondary | ICD-10-CM | POA: Diagnosis not present

## 2017-05-30 DIAGNOSIS — N2581 Secondary hyperparathyroidism of renal origin: Secondary | ICD-10-CM | POA: Diagnosis not present

## 2017-06-03 DIAGNOSIS — D509 Iron deficiency anemia, unspecified: Secondary | ICD-10-CM | POA: Diagnosis not present

## 2017-06-03 DIAGNOSIS — D631 Anemia in chronic kidney disease: Secondary | ICD-10-CM | POA: Diagnosis not present

## 2017-06-03 DIAGNOSIS — N2581 Secondary hyperparathyroidism of renal origin: Secondary | ICD-10-CM | POA: Diagnosis not present

## 2017-06-03 DIAGNOSIS — Z79899 Other long term (current) drug therapy: Secondary | ICD-10-CM | POA: Diagnosis not present

## 2017-06-03 DIAGNOSIS — N186 End stage renal disease: Secondary | ICD-10-CM | POA: Diagnosis not present

## 2017-06-03 DIAGNOSIS — R17 Unspecified jaundice: Secondary | ICD-10-CM | POA: Diagnosis not present

## 2017-06-04 DIAGNOSIS — D509 Iron deficiency anemia, unspecified: Secondary | ICD-10-CM | POA: Diagnosis not present

## 2017-06-04 DIAGNOSIS — N186 End stage renal disease: Secondary | ICD-10-CM | POA: Diagnosis not present

## 2017-06-04 DIAGNOSIS — N2581 Secondary hyperparathyroidism of renal origin: Secondary | ICD-10-CM | POA: Diagnosis not present

## 2017-06-04 DIAGNOSIS — Z4932 Encounter for adequacy testing for peritoneal dialysis: Secondary | ICD-10-CM | POA: Diagnosis not present

## 2017-06-04 DIAGNOSIS — D631 Anemia in chronic kidney disease: Secondary | ICD-10-CM | POA: Diagnosis not present

## 2017-06-05 DIAGNOSIS — N186 End stage renal disease: Secondary | ICD-10-CM | POA: Diagnosis not present

## 2017-06-05 DIAGNOSIS — Z4932 Encounter for adequacy testing for peritoneal dialysis: Secondary | ICD-10-CM | POA: Diagnosis not present

## 2017-06-05 DIAGNOSIS — D509 Iron deficiency anemia, unspecified: Secondary | ICD-10-CM | POA: Diagnosis not present

## 2017-06-05 DIAGNOSIS — D631 Anemia in chronic kidney disease: Secondary | ICD-10-CM | POA: Diagnosis not present

## 2017-06-05 DIAGNOSIS — N2581 Secondary hyperparathyroidism of renal origin: Secondary | ICD-10-CM | POA: Diagnosis not present

## 2017-06-06 DIAGNOSIS — N186 End stage renal disease: Secondary | ICD-10-CM | POA: Diagnosis not present

## 2017-06-06 DIAGNOSIS — D631 Anemia in chronic kidney disease: Secondary | ICD-10-CM | POA: Diagnosis not present

## 2017-06-06 DIAGNOSIS — N2581 Secondary hyperparathyroidism of renal origin: Secondary | ICD-10-CM | POA: Diagnosis not present

## 2017-06-06 DIAGNOSIS — Z4932 Encounter for adequacy testing for peritoneal dialysis: Secondary | ICD-10-CM | POA: Diagnosis not present

## 2017-06-06 DIAGNOSIS — D509 Iron deficiency anemia, unspecified: Secondary | ICD-10-CM | POA: Diagnosis not present

## 2017-06-07 DIAGNOSIS — D509 Iron deficiency anemia, unspecified: Secondary | ICD-10-CM | POA: Diagnosis not present

## 2017-06-07 DIAGNOSIS — Z4932 Encounter for adequacy testing for peritoneal dialysis: Secondary | ICD-10-CM | POA: Diagnosis not present

## 2017-06-07 DIAGNOSIS — D631 Anemia in chronic kidney disease: Secondary | ICD-10-CM | POA: Diagnosis not present

## 2017-06-07 DIAGNOSIS — N2581 Secondary hyperparathyroidism of renal origin: Secondary | ICD-10-CM | POA: Diagnosis not present

## 2017-06-07 DIAGNOSIS — N186 End stage renal disease: Secondary | ICD-10-CM | POA: Diagnosis not present

## 2017-06-08 DIAGNOSIS — Z4932 Encounter for adequacy testing for peritoneal dialysis: Secondary | ICD-10-CM | POA: Diagnosis not present

## 2017-06-08 DIAGNOSIS — N186 End stage renal disease: Secondary | ICD-10-CM | POA: Diagnosis not present

## 2017-06-08 DIAGNOSIS — D631 Anemia in chronic kidney disease: Secondary | ICD-10-CM | POA: Diagnosis not present

## 2017-06-08 DIAGNOSIS — D509 Iron deficiency anemia, unspecified: Secondary | ICD-10-CM | POA: Diagnosis not present

## 2017-06-08 DIAGNOSIS — N2581 Secondary hyperparathyroidism of renal origin: Secondary | ICD-10-CM | POA: Diagnosis not present

## 2017-06-09 DIAGNOSIS — D509 Iron deficiency anemia, unspecified: Secondary | ICD-10-CM | POA: Diagnosis not present

## 2017-06-09 DIAGNOSIS — N186 End stage renal disease: Secondary | ICD-10-CM | POA: Diagnosis not present

## 2017-06-09 DIAGNOSIS — D631 Anemia in chronic kidney disease: Secondary | ICD-10-CM | POA: Diagnosis not present

## 2017-06-09 DIAGNOSIS — N2581 Secondary hyperparathyroidism of renal origin: Secondary | ICD-10-CM | POA: Diagnosis not present

## 2017-06-09 DIAGNOSIS — Z4932 Encounter for adequacy testing for peritoneal dialysis: Secondary | ICD-10-CM | POA: Diagnosis not present

## 2017-06-10 ENCOUNTER — Ambulatory Visit
Admission: RE | Admit: 2017-06-10 | Discharge: 2017-06-10 | Disposition: A | Discharge status: Home / Self Care | Payer: No Typology Code available for payment source | Source: Ambulatory Visit | Attending: Nephrology | Admitting: Nephrology

## 2017-06-10 ENCOUNTER — Other Ambulatory Visit: Payer: Self-pay | Admitting: Nephrology

## 2017-06-10 DIAGNOSIS — N2581 Secondary hyperparathyroidism of renal origin: Secondary | ICD-10-CM | POA: Diagnosis not present

## 2017-06-10 DIAGNOSIS — N186 End stage renal disease: Secondary | ICD-10-CM | POA: Diagnosis not present

## 2017-06-10 DIAGNOSIS — Z4932 Encounter for adequacy testing for peritoneal dialysis: Secondary | ICD-10-CM | POA: Diagnosis not present

## 2017-06-10 DIAGNOSIS — Z466 Encounter for fitting and adjustment of urinary device: Secondary | ICD-10-CM

## 2017-06-10 DIAGNOSIS — D509 Iron deficiency anemia, unspecified: Secondary | ICD-10-CM | POA: Diagnosis not present

## 2017-06-10 DIAGNOSIS — D631 Anemia in chronic kidney disease: Secondary | ICD-10-CM | POA: Diagnosis not present

## 2017-06-11 DIAGNOSIS — N186 End stage renal disease: Secondary | ICD-10-CM | POA: Diagnosis not present

## 2017-06-11 DIAGNOSIS — Z4932 Encounter for adequacy testing for peritoneal dialysis: Secondary | ICD-10-CM | POA: Diagnosis not present

## 2017-06-11 DIAGNOSIS — D509 Iron deficiency anemia, unspecified: Secondary | ICD-10-CM | POA: Diagnosis not present

## 2017-06-11 DIAGNOSIS — F322 Major depressive disorder, single episode, severe without psychotic features: Secondary | ICD-10-CM | POA: Diagnosis not present

## 2017-06-11 DIAGNOSIS — I10 Essential (primary) hypertension: Secondary | ICD-10-CM | POA: Diagnosis not present

## 2017-06-11 DIAGNOSIS — B182 Chronic viral hepatitis C: Secondary | ICD-10-CM | POA: Diagnosis not present

## 2017-06-11 DIAGNOSIS — D631 Anemia in chronic kidney disease: Secondary | ICD-10-CM | POA: Diagnosis not present

## 2017-06-11 DIAGNOSIS — Z992 Dependence on renal dialysis: Secondary | ICD-10-CM | POA: Diagnosis not present

## 2017-06-11 DIAGNOSIS — E782 Mixed hyperlipidemia: Secondary | ICD-10-CM | POA: Diagnosis not present

## 2017-06-11 DIAGNOSIS — E1122 Type 2 diabetes mellitus with diabetic chronic kidney disease: Secondary | ICD-10-CM | POA: Diagnosis not present

## 2017-06-11 DIAGNOSIS — N2581 Secondary hyperparathyroidism of renal origin: Secondary | ICD-10-CM | POA: Diagnosis not present

## 2017-06-12 DIAGNOSIS — D631 Anemia in chronic kidney disease: Secondary | ICD-10-CM | POA: Diagnosis not present

## 2017-06-12 DIAGNOSIS — D509 Iron deficiency anemia, unspecified: Secondary | ICD-10-CM | POA: Diagnosis not present

## 2017-06-12 DIAGNOSIS — N186 End stage renal disease: Secondary | ICD-10-CM | POA: Diagnosis not present

## 2017-06-12 DIAGNOSIS — N2581 Secondary hyperparathyroidism of renal origin: Secondary | ICD-10-CM | POA: Diagnosis not present

## 2017-06-12 DIAGNOSIS — Z4932 Encounter for adequacy testing for peritoneal dialysis: Secondary | ICD-10-CM | POA: Diagnosis not present

## 2017-06-13 DIAGNOSIS — N2581 Secondary hyperparathyroidism of renal origin: Secondary | ICD-10-CM | POA: Diagnosis not present

## 2017-06-13 DIAGNOSIS — Z4932 Encounter for adequacy testing for peritoneal dialysis: Secondary | ICD-10-CM | POA: Diagnosis not present

## 2017-06-13 DIAGNOSIS — D631 Anemia in chronic kidney disease: Secondary | ICD-10-CM | POA: Diagnosis not present

## 2017-06-13 DIAGNOSIS — N186 End stage renal disease: Secondary | ICD-10-CM | POA: Diagnosis not present

## 2017-06-13 DIAGNOSIS — D509 Iron deficiency anemia, unspecified: Secondary | ICD-10-CM | POA: Diagnosis not present

## 2017-06-14 ENCOUNTER — Encounter (HOSPITAL_COMMUNITY): Payer: No Typology Code available for payment source

## 2017-06-14 DIAGNOSIS — Z4932 Encounter for adequacy testing for peritoneal dialysis: Secondary | ICD-10-CM | POA: Diagnosis not present

## 2017-06-14 DIAGNOSIS — N186 End stage renal disease: Secondary | ICD-10-CM | POA: Diagnosis not present

## 2017-06-14 DIAGNOSIS — D631 Anemia in chronic kidney disease: Secondary | ICD-10-CM | POA: Diagnosis not present

## 2017-06-14 DIAGNOSIS — D509 Iron deficiency anemia, unspecified: Secondary | ICD-10-CM | POA: Diagnosis not present

## 2017-06-14 DIAGNOSIS — N2581 Secondary hyperparathyroidism of renal origin: Secondary | ICD-10-CM | POA: Diagnosis not present

## 2017-06-15 DIAGNOSIS — D509 Iron deficiency anemia, unspecified: Secondary | ICD-10-CM | POA: Diagnosis not present

## 2017-06-15 DIAGNOSIS — D631 Anemia in chronic kidney disease: Secondary | ICD-10-CM | POA: Diagnosis not present

## 2017-06-15 DIAGNOSIS — N2581 Secondary hyperparathyroidism of renal origin: Secondary | ICD-10-CM | POA: Diagnosis not present

## 2017-06-15 DIAGNOSIS — N186 End stage renal disease: Secondary | ICD-10-CM | POA: Diagnosis not present

## 2017-06-15 DIAGNOSIS — Z4932 Encounter for adequacy testing for peritoneal dialysis: Secondary | ICD-10-CM | POA: Diagnosis not present

## 2017-06-16 DIAGNOSIS — D631 Anemia in chronic kidney disease: Secondary | ICD-10-CM | POA: Diagnosis not present

## 2017-06-16 DIAGNOSIS — D509 Iron deficiency anemia, unspecified: Secondary | ICD-10-CM | POA: Diagnosis not present

## 2017-06-16 DIAGNOSIS — N186 End stage renal disease: Secondary | ICD-10-CM | POA: Diagnosis not present

## 2017-06-16 DIAGNOSIS — N2581 Secondary hyperparathyroidism of renal origin: Secondary | ICD-10-CM | POA: Diagnosis not present

## 2017-06-16 DIAGNOSIS — Z4932 Encounter for adequacy testing for peritoneal dialysis: Secondary | ICD-10-CM | POA: Diagnosis not present

## 2017-06-17 DIAGNOSIS — D509 Iron deficiency anemia, unspecified: Secondary | ICD-10-CM | POA: Diagnosis not present

## 2017-06-17 DIAGNOSIS — D631 Anemia in chronic kidney disease: Secondary | ICD-10-CM | POA: Diagnosis not present

## 2017-06-17 DIAGNOSIS — Z4932 Encounter for adequacy testing for peritoneal dialysis: Secondary | ICD-10-CM | POA: Diagnosis not present

## 2017-06-17 DIAGNOSIS — N186 End stage renal disease: Secondary | ICD-10-CM | POA: Diagnosis not present

## 2017-06-17 DIAGNOSIS — N2581 Secondary hyperparathyroidism of renal origin: Secondary | ICD-10-CM | POA: Diagnosis not present

## 2017-06-18 DIAGNOSIS — N186 End stage renal disease: Secondary | ICD-10-CM | POA: Diagnosis not present

## 2017-06-18 DIAGNOSIS — Z4932 Encounter for adequacy testing for peritoneal dialysis: Secondary | ICD-10-CM | POA: Diagnosis not present

## 2017-06-18 DIAGNOSIS — D631 Anemia in chronic kidney disease: Secondary | ICD-10-CM | POA: Diagnosis not present

## 2017-06-18 DIAGNOSIS — D509 Iron deficiency anemia, unspecified: Secondary | ICD-10-CM | POA: Diagnosis not present

## 2017-06-18 DIAGNOSIS — N2581 Secondary hyperparathyroidism of renal origin: Secondary | ICD-10-CM | POA: Diagnosis not present

## 2017-06-19 DIAGNOSIS — D631 Anemia in chronic kidney disease: Secondary | ICD-10-CM | POA: Diagnosis not present

## 2017-06-19 DIAGNOSIS — D509 Iron deficiency anemia, unspecified: Secondary | ICD-10-CM | POA: Diagnosis not present

## 2017-06-19 DIAGNOSIS — N186 End stage renal disease: Secondary | ICD-10-CM | POA: Diagnosis not present

## 2017-06-19 DIAGNOSIS — Z4932 Encounter for adequacy testing for peritoneal dialysis: Secondary | ICD-10-CM | POA: Diagnosis not present

## 2017-06-19 DIAGNOSIS — N2581 Secondary hyperparathyroidism of renal origin: Secondary | ICD-10-CM | POA: Diagnosis not present

## 2017-06-20 DIAGNOSIS — Z4932 Encounter for adequacy testing for peritoneal dialysis: Secondary | ICD-10-CM | POA: Diagnosis not present

## 2017-06-20 DIAGNOSIS — D509 Iron deficiency anemia, unspecified: Secondary | ICD-10-CM | POA: Diagnosis not present

## 2017-06-20 DIAGNOSIS — N186 End stage renal disease: Secondary | ICD-10-CM | POA: Diagnosis not present

## 2017-06-20 DIAGNOSIS — D631 Anemia in chronic kidney disease: Secondary | ICD-10-CM | POA: Diagnosis not present

## 2017-06-20 DIAGNOSIS — N2581 Secondary hyperparathyroidism of renal origin: Secondary | ICD-10-CM | POA: Diagnosis not present

## 2017-06-21 DIAGNOSIS — N185 Chronic kidney disease, stage 5: Secondary | ICD-10-CM | POA: Diagnosis not present

## 2017-06-21 DIAGNOSIS — N2581 Secondary hyperparathyroidism of renal origin: Secondary | ICD-10-CM | POA: Diagnosis not present

## 2017-06-21 DIAGNOSIS — Z4932 Encounter for adequacy testing for peritoneal dialysis: Secondary | ICD-10-CM | POA: Diagnosis not present

## 2017-06-21 DIAGNOSIS — N186 End stage renal disease: Secondary | ICD-10-CM | POA: Diagnosis not present

## 2017-06-21 DIAGNOSIS — D509 Iron deficiency anemia, unspecified: Secondary | ICD-10-CM | POA: Diagnosis not present

## 2017-06-21 DIAGNOSIS — I129 Hypertensive chronic kidney disease with stage 1 through stage 4 chronic kidney disease, or unspecified chronic kidney disease: Secondary | ICD-10-CM | POA: Diagnosis not present

## 2017-06-21 DIAGNOSIS — Z992 Dependence on renal dialysis: Secondary | ICD-10-CM | POA: Diagnosis not present

## 2017-06-21 DIAGNOSIS — D631 Anemia in chronic kidney disease: Secondary | ICD-10-CM | POA: Diagnosis not present

## 2017-06-22 DIAGNOSIS — R880 Cloudy (hemodialysis) (peritoneal) dialysis effluent: Secondary | ICD-10-CM | POA: Diagnosis not present

## 2017-06-22 DIAGNOSIS — D509 Iron deficiency anemia, unspecified: Secondary | ICD-10-CM | POA: Diagnosis not present

## 2017-06-22 DIAGNOSIS — N2581 Secondary hyperparathyroidism of renal origin: Secondary | ICD-10-CM | POA: Diagnosis not present

## 2017-06-22 DIAGNOSIS — K769 Liver disease, unspecified: Secondary | ICD-10-CM | POA: Diagnosis not present

## 2017-06-22 DIAGNOSIS — Z79899 Other long term (current) drug therapy: Secondary | ICD-10-CM | POA: Diagnosis not present

## 2017-06-22 DIAGNOSIS — R17 Unspecified jaundice: Secondary | ICD-10-CM | POA: Diagnosis not present

## 2017-06-22 DIAGNOSIS — D631 Anemia in chronic kidney disease: Secondary | ICD-10-CM | POA: Diagnosis not present

## 2017-06-22 DIAGNOSIS — E440 Moderate protein-calorie malnutrition: Secondary | ICD-10-CM | POA: Diagnosis not present

## 2017-06-22 DIAGNOSIS — N186 End stage renal disease: Secondary | ICD-10-CM | POA: Diagnosis not present

## 2017-06-22 DIAGNOSIS — Z4932 Encounter for adequacy testing for peritoneal dialysis: Secondary | ICD-10-CM | POA: Diagnosis not present

## 2017-06-23 DIAGNOSIS — D631 Anemia in chronic kidney disease: Secondary | ICD-10-CM | POA: Diagnosis not present

## 2017-06-23 DIAGNOSIS — N186 End stage renal disease: Secondary | ICD-10-CM | POA: Diagnosis not present

## 2017-06-23 DIAGNOSIS — N2581 Secondary hyperparathyroidism of renal origin: Secondary | ICD-10-CM | POA: Diagnosis not present

## 2017-06-23 DIAGNOSIS — D509 Iron deficiency anemia, unspecified: Secondary | ICD-10-CM | POA: Diagnosis not present

## 2017-06-23 DIAGNOSIS — R880 Cloudy (hemodialysis) (peritoneal) dialysis effluent: Secondary | ICD-10-CM | POA: Diagnosis not present

## 2017-06-23 DIAGNOSIS — Z79899 Other long term (current) drug therapy: Secondary | ICD-10-CM | POA: Diagnosis not present

## 2017-06-24 ENCOUNTER — Encounter: Payer: Self-pay | Admitting: Obstetrics and Gynecology

## 2017-06-24 DIAGNOSIS — D631 Anemia in chronic kidney disease: Secondary | ICD-10-CM | POA: Diagnosis not present

## 2017-06-24 DIAGNOSIS — T85611A Breakdown (mechanical) of intraperitoneal dialysis catheter, initial encounter: Secondary | ICD-10-CM | POA: Diagnosis not present

## 2017-06-24 DIAGNOSIS — N2581 Secondary hyperparathyroidism of renal origin: Secondary | ICD-10-CM | POA: Diagnosis not present

## 2017-06-24 DIAGNOSIS — N186 End stage renal disease: Secondary | ICD-10-CM | POA: Diagnosis not present

## 2017-06-24 DIAGNOSIS — Z79899 Other long term (current) drug therapy: Secondary | ICD-10-CM | POA: Diagnosis not present

## 2017-06-24 DIAGNOSIS — N185 Chronic kidney disease, stage 5: Secondary | ICD-10-CM | POA: Diagnosis not present

## 2017-06-24 DIAGNOSIS — D509 Iron deficiency anemia, unspecified: Secondary | ICD-10-CM | POA: Diagnosis not present

## 2017-06-24 DIAGNOSIS — R880 Cloudy (hemodialysis) (peritoneal) dialysis effluent: Secondary | ICD-10-CM | POA: Diagnosis not present

## 2017-06-24 DIAGNOSIS — E119 Type 2 diabetes mellitus without complications: Secondary | ICD-10-CM | POA: Diagnosis not present

## 2017-06-25 ENCOUNTER — Encounter: Payer: Self-pay | Admitting: *Deleted

## 2017-06-25 DIAGNOSIS — N2581 Secondary hyperparathyroidism of renal origin: Secondary | ICD-10-CM | POA: Diagnosis not present

## 2017-06-25 DIAGNOSIS — D631 Anemia in chronic kidney disease: Secondary | ICD-10-CM | POA: Diagnosis not present

## 2017-06-25 DIAGNOSIS — Z79899 Other long term (current) drug therapy: Secondary | ICD-10-CM | POA: Diagnosis not present

## 2017-06-25 DIAGNOSIS — D509 Iron deficiency anemia, unspecified: Secondary | ICD-10-CM | POA: Diagnosis not present

## 2017-06-25 DIAGNOSIS — N186 End stage renal disease: Secondary | ICD-10-CM | POA: Diagnosis not present

## 2017-06-25 DIAGNOSIS — R880 Cloudy (hemodialysis) (peritoneal) dialysis effluent: Secondary | ICD-10-CM | POA: Diagnosis not present

## 2017-06-26 DIAGNOSIS — N186 End stage renal disease: Secondary | ICD-10-CM | POA: Diagnosis not present

## 2017-06-26 DIAGNOSIS — Z79899 Other long term (current) drug therapy: Secondary | ICD-10-CM | POA: Diagnosis not present

## 2017-06-26 DIAGNOSIS — D509 Iron deficiency anemia, unspecified: Secondary | ICD-10-CM | POA: Diagnosis not present

## 2017-06-26 DIAGNOSIS — R880 Cloudy (hemodialysis) (peritoneal) dialysis effluent: Secondary | ICD-10-CM | POA: Diagnosis not present

## 2017-06-26 DIAGNOSIS — N2581 Secondary hyperparathyroidism of renal origin: Secondary | ICD-10-CM | POA: Diagnosis not present

## 2017-06-26 DIAGNOSIS — D631 Anemia in chronic kidney disease: Secondary | ICD-10-CM | POA: Diagnosis not present

## 2017-06-27 DIAGNOSIS — N186 End stage renal disease: Secondary | ICD-10-CM | POA: Diagnosis not present

## 2017-06-27 DIAGNOSIS — N2581 Secondary hyperparathyroidism of renal origin: Secondary | ICD-10-CM | POA: Diagnosis not present

## 2017-06-27 DIAGNOSIS — D509 Iron deficiency anemia, unspecified: Secondary | ICD-10-CM | POA: Diagnosis not present

## 2017-06-27 DIAGNOSIS — D631 Anemia in chronic kidney disease: Secondary | ICD-10-CM | POA: Diagnosis not present

## 2017-06-27 DIAGNOSIS — Z79899 Other long term (current) drug therapy: Secondary | ICD-10-CM | POA: Diagnosis not present

## 2017-06-27 DIAGNOSIS — R880 Cloudy (hemodialysis) (peritoneal) dialysis effluent: Secondary | ICD-10-CM | POA: Diagnosis not present

## 2017-06-28 DIAGNOSIS — D631 Anemia in chronic kidney disease: Secondary | ICD-10-CM | POA: Diagnosis not present

## 2017-06-28 DIAGNOSIS — D509 Iron deficiency anemia, unspecified: Secondary | ICD-10-CM | POA: Diagnosis not present

## 2017-06-28 DIAGNOSIS — N2581 Secondary hyperparathyroidism of renal origin: Secondary | ICD-10-CM | POA: Diagnosis not present

## 2017-06-28 DIAGNOSIS — N186 End stage renal disease: Secondary | ICD-10-CM | POA: Diagnosis not present

## 2017-06-28 DIAGNOSIS — R880 Cloudy (hemodialysis) (peritoneal) dialysis effluent: Secondary | ICD-10-CM | POA: Diagnosis not present

## 2017-06-28 DIAGNOSIS — R82994 Hypercalciuria: Secondary | ICD-10-CM | POA: Diagnosis not present

## 2017-06-28 DIAGNOSIS — Z79899 Other long term (current) drug therapy: Secondary | ICD-10-CM | POA: Diagnosis not present

## 2017-06-29 DIAGNOSIS — Z79899 Other long term (current) drug therapy: Secondary | ICD-10-CM | POA: Diagnosis not present

## 2017-06-29 DIAGNOSIS — N2581 Secondary hyperparathyroidism of renal origin: Secondary | ICD-10-CM | POA: Diagnosis not present

## 2017-06-29 DIAGNOSIS — N186 End stage renal disease: Secondary | ICD-10-CM | POA: Diagnosis not present

## 2017-06-29 DIAGNOSIS — D631 Anemia in chronic kidney disease: Secondary | ICD-10-CM | POA: Diagnosis not present

## 2017-06-29 DIAGNOSIS — R880 Cloudy (hemodialysis) (peritoneal) dialysis effluent: Secondary | ICD-10-CM | POA: Diagnosis not present

## 2017-06-29 DIAGNOSIS — D509 Iron deficiency anemia, unspecified: Secondary | ICD-10-CM | POA: Diagnosis not present

## 2017-06-30 DIAGNOSIS — D509 Iron deficiency anemia, unspecified: Secondary | ICD-10-CM | POA: Diagnosis not present

## 2017-06-30 DIAGNOSIS — R880 Cloudy (hemodialysis) (peritoneal) dialysis effluent: Secondary | ICD-10-CM | POA: Diagnosis not present

## 2017-06-30 DIAGNOSIS — N2581 Secondary hyperparathyroidism of renal origin: Secondary | ICD-10-CM | POA: Diagnosis not present

## 2017-06-30 DIAGNOSIS — N186 End stage renal disease: Secondary | ICD-10-CM | POA: Diagnosis not present

## 2017-06-30 DIAGNOSIS — D631 Anemia in chronic kidney disease: Secondary | ICD-10-CM | POA: Diagnosis not present

## 2017-06-30 DIAGNOSIS — Z79899 Other long term (current) drug therapy: Secondary | ICD-10-CM | POA: Diagnosis not present

## 2017-07-01 DIAGNOSIS — D631 Anemia in chronic kidney disease: Secondary | ICD-10-CM | POA: Diagnosis not present

## 2017-07-01 DIAGNOSIS — N2581 Secondary hyperparathyroidism of renal origin: Secondary | ICD-10-CM | POA: Diagnosis not present

## 2017-07-01 DIAGNOSIS — Z79899 Other long term (current) drug therapy: Secondary | ICD-10-CM | POA: Diagnosis not present

## 2017-07-01 DIAGNOSIS — R880 Cloudy (hemodialysis) (peritoneal) dialysis effluent: Secondary | ICD-10-CM | POA: Diagnosis not present

## 2017-07-01 DIAGNOSIS — D509 Iron deficiency anemia, unspecified: Secondary | ICD-10-CM | POA: Diagnosis not present

## 2017-07-01 DIAGNOSIS — N186 End stage renal disease: Secondary | ICD-10-CM | POA: Diagnosis not present

## 2017-07-02 DIAGNOSIS — Z79899 Other long term (current) drug therapy: Secondary | ICD-10-CM | POA: Diagnosis not present

## 2017-07-02 DIAGNOSIS — D509 Iron deficiency anemia, unspecified: Secondary | ICD-10-CM | POA: Diagnosis not present

## 2017-07-02 DIAGNOSIS — D631 Anemia in chronic kidney disease: Secondary | ICD-10-CM | POA: Diagnosis not present

## 2017-07-02 DIAGNOSIS — N186 End stage renal disease: Secondary | ICD-10-CM | POA: Diagnosis not present

## 2017-07-02 DIAGNOSIS — R880 Cloudy (hemodialysis) (peritoneal) dialysis effluent: Secondary | ICD-10-CM | POA: Diagnosis not present

## 2017-07-02 DIAGNOSIS — N2581 Secondary hyperparathyroidism of renal origin: Secondary | ICD-10-CM | POA: Diagnosis not present

## 2017-07-03 DIAGNOSIS — Z79899 Other long term (current) drug therapy: Secondary | ICD-10-CM | POA: Diagnosis not present

## 2017-07-03 DIAGNOSIS — D509 Iron deficiency anemia, unspecified: Secondary | ICD-10-CM | POA: Diagnosis not present

## 2017-07-03 DIAGNOSIS — R880 Cloudy (hemodialysis) (peritoneal) dialysis effluent: Secondary | ICD-10-CM | POA: Diagnosis not present

## 2017-07-03 DIAGNOSIS — D631 Anemia in chronic kidney disease: Secondary | ICD-10-CM | POA: Diagnosis not present

## 2017-07-03 DIAGNOSIS — N186 End stage renal disease: Secondary | ICD-10-CM | POA: Diagnosis not present

## 2017-07-03 DIAGNOSIS — N2581 Secondary hyperparathyroidism of renal origin: Secondary | ICD-10-CM | POA: Diagnosis not present

## 2017-07-04 DIAGNOSIS — D631 Anemia in chronic kidney disease: Secondary | ICD-10-CM | POA: Diagnosis not present

## 2017-07-04 DIAGNOSIS — D509 Iron deficiency anemia, unspecified: Secondary | ICD-10-CM | POA: Diagnosis not present

## 2017-07-04 DIAGNOSIS — Z9889 Other specified postprocedural states: Secondary | ICD-10-CM | POA: Diagnosis not present

## 2017-07-04 DIAGNOSIS — R880 Cloudy (hemodialysis) (peritoneal) dialysis effluent: Secondary | ICD-10-CM | POA: Diagnosis not present

## 2017-07-04 DIAGNOSIS — N186 End stage renal disease: Secondary | ICD-10-CM | POA: Diagnosis not present

## 2017-07-04 DIAGNOSIS — N185 Chronic kidney disease, stage 5: Secondary | ICD-10-CM | POA: Diagnosis not present

## 2017-07-04 DIAGNOSIS — Z79899 Other long term (current) drug therapy: Secondary | ICD-10-CM | POA: Diagnosis not present

## 2017-07-04 DIAGNOSIS — N2581 Secondary hyperparathyroidism of renal origin: Secondary | ICD-10-CM | POA: Diagnosis not present

## 2017-07-04 DIAGNOSIS — Z4902 Encounter for fitting and adjustment of peritoneal dialysis catheter: Secondary | ICD-10-CM | POA: Diagnosis not present

## 2017-07-05 DIAGNOSIS — Z79899 Other long term (current) drug therapy: Secondary | ICD-10-CM | POA: Diagnosis not present

## 2017-07-05 DIAGNOSIS — N186 End stage renal disease: Secondary | ICD-10-CM | POA: Diagnosis not present

## 2017-07-05 DIAGNOSIS — D631 Anemia in chronic kidney disease: Secondary | ICD-10-CM | POA: Diagnosis not present

## 2017-07-05 DIAGNOSIS — N2581 Secondary hyperparathyroidism of renal origin: Secondary | ICD-10-CM | POA: Diagnosis not present

## 2017-07-05 DIAGNOSIS — R880 Cloudy (hemodialysis) (peritoneal) dialysis effluent: Secondary | ICD-10-CM | POA: Diagnosis not present

## 2017-07-05 DIAGNOSIS — D509 Iron deficiency anemia, unspecified: Secondary | ICD-10-CM | POA: Diagnosis not present

## 2017-07-06 DIAGNOSIS — D631 Anemia in chronic kidney disease: Secondary | ICD-10-CM | POA: Diagnosis not present

## 2017-07-06 DIAGNOSIS — R880 Cloudy (hemodialysis) (peritoneal) dialysis effluent: Secondary | ICD-10-CM | POA: Diagnosis not present

## 2017-07-06 DIAGNOSIS — N186 End stage renal disease: Secondary | ICD-10-CM | POA: Diagnosis not present

## 2017-07-06 DIAGNOSIS — D509 Iron deficiency anemia, unspecified: Secondary | ICD-10-CM | POA: Diagnosis not present

## 2017-07-06 DIAGNOSIS — Z79899 Other long term (current) drug therapy: Secondary | ICD-10-CM | POA: Diagnosis not present

## 2017-07-06 DIAGNOSIS — N2581 Secondary hyperparathyroidism of renal origin: Secondary | ICD-10-CM | POA: Diagnosis not present

## 2017-07-07 DIAGNOSIS — D631 Anemia in chronic kidney disease: Secondary | ICD-10-CM | POA: Diagnosis not present

## 2017-07-07 DIAGNOSIS — Z79899 Other long term (current) drug therapy: Secondary | ICD-10-CM | POA: Diagnosis not present

## 2017-07-07 DIAGNOSIS — D509 Iron deficiency anemia, unspecified: Secondary | ICD-10-CM | POA: Diagnosis not present

## 2017-07-07 DIAGNOSIS — R880 Cloudy (hemodialysis) (peritoneal) dialysis effluent: Secondary | ICD-10-CM | POA: Diagnosis not present

## 2017-07-07 DIAGNOSIS — N186 End stage renal disease: Secondary | ICD-10-CM | POA: Diagnosis not present

## 2017-07-07 DIAGNOSIS — N2581 Secondary hyperparathyroidism of renal origin: Secondary | ICD-10-CM | POA: Diagnosis not present

## 2017-07-08 DIAGNOSIS — N186 End stage renal disease: Secondary | ICD-10-CM | POA: Diagnosis not present

## 2017-07-08 DIAGNOSIS — Z79899 Other long term (current) drug therapy: Secondary | ICD-10-CM | POA: Diagnosis not present

## 2017-07-08 DIAGNOSIS — R880 Cloudy (hemodialysis) (peritoneal) dialysis effluent: Secondary | ICD-10-CM | POA: Diagnosis not present

## 2017-07-08 DIAGNOSIS — D631 Anemia in chronic kidney disease: Secondary | ICD-10-CM | POA: Diagnosis not present

## 2017-07-08 DIAGNOSIS — D509 Iron deficiency anemia, unspecified: Secondary | ICD-10-CM | POA: Diagnosis not present

## 2017-07-08 DIAGNOSIS — N2581 Secondary hyperparathyroidism of renal origin: Secondary | ICD-10-CM | POA: Diagnosis not present

## 2017-07-09 ENCOUNTER — Other Ambulatory Visit: Payer: Self-pay

## 2017-07-09 DIAGNOSIS — Z79899 Other long term (current) drug therapy: Secondary | ICD-10-CM | POA: Insufficient documentation

## 2017-07-09 DIAGNOSIS — N183 Chronic kidney disease, stage 3 (moderate): Secondary | ICD-10-CM | POA: Insufficient documentation

## 2017-07-09 DIAGNOSIS — Z794 Long term (current) use of insulin: Secondary | ICD-10-CM | POA: Insufficient documentation

## 2017-07-09 DIAGNOSIS — T85691A Other mechanical complication of intraperitoneal dialysis catheter, initial encounter: Secondary | ICD-10-CM | POA: Diagnosis not present

## 2017-07-09 DIAGNOSIS — E1122 Type 2 diabetes mellitus with diabetic chronic kidney disease: Secondary | ICD-10-CM | POA: Diagnosis not present

## 2017-07-09 DIAGNOSIS — Z7982 Long term (current) use of aspirin: Secondary | ICD-10-CM | POA: Insufficient documentation

## 2017-07-09 DIAGNOSIS — R1900 Intra-abdominal and pelvic swelling, mass and lump, unspecified site: Secondary | ICD-10-CM | POA: Diagnosis not present

## 2017-07-09 DIAGNOSIS — Z992 Dependence on renal dialysis: Secondary | ICD-10-CM | POA: Diagnosis not present

## 2017-07-09 DIAGNOSIS — I129 Hypertensive chronic kidney disease with stage 1 through stage 4 chronic kidney disease, or unspecified chronic kidney disease: Secondary | ICD-10-CM | POA: Diagnosis not present

## 2017-07-09 DIAGNOSIS — R880 Cloudy (hemodialysis) (peritoneal) dialysis effluent: Secondary | ICD-10-CM | POA: Diagnosis not present

## 2017-07-09 DIAGNOSIS — Y829 Unspecified medical devices associated with adverse incidents: Secondary | ICD-10-CM | POA: Diagnosis not present

## 2017-07-09 DIAGNOSIS — D631 Anemia in chronic kidney disease: Secondary | ICD-10-CM | POA: Insufficient documentation

## 2017-07-09 DIAGNOSIS — R1084 Generalized abdominal pain: Secondary | ICD-10-CM | POA: Diagnosis not present

## 2017-07-09 DIAGNOSIS — N2581 Secondary hyperparathyroidism of renal origin: Secondary | ICD-10-CM | POA: Diagnosis not present

## 2017-07-09 DIAGNOSIS — D509 Iron deficiency anemia, unspecified: Secondary | ICD-10-CM | POA: Diagnosis not present

## 2017-07-09 DIAGNOSIS — N186 End stage renal disease: Secondary | ICD-10-CM | POA: Diagnosis not present

## 2017-07-10 ENCOUNTER — Emergency Department (HOSPITAL_COMMUNITY)
Admission: EM | Admit: 2017-07-10 | Discharge: 2017-07-10 | Disposition: A | Discharge status: Home / Self Care | Payer: Medicare Other | Attending: Emergency Medicine | Admitting: Emergency Medicine

## 2017-07-10 ENCOUNTER — Emergency Department (HOSPITAL_COMMUNITY): Payer: Medicare Other

## 2017-07-10 ENCOUNTER — Encounter (HOSPITAL_COMMUNITY): Payer: Self-pay | Admitting: Emergency Medicine

## 2017-07-10 DIAGNOSIS — Z79899 Other long term (current) drug therapy: Secondary | ICD-10-CM | POA: Diagnosis not present

## 2017-07-10 DIAGNOSIS — D509 Iron deficiency anemia, unspecified: Secondary | ICD-10-CM | POA: Diagnosis not present

## 2017-07-10 DIAGNOSIS — D631 Anemia in chronic kidney disease: Secondary | ICD-10-CM | POA: Diagnosis not present

## 2017-07-10 DIAGNOSIS — T85691A Other mechanical complication of intraperitoneal dialysis catheter, initial encounter: Secondary | ICD-10-CM

## 2017-07-10 DIAGNOSIS — R880 Cloudy (hemodialysis) (peritoneal) dialysis effluent: Secondary | ICD-10-CM | POA: Diagnosis not present

## 2017-07-10 DIAGNOSIS — N186 End stage renal disease: Secondary | ICD-10-CM | POA: Diagnosis not present

## 2017-07-10 DIAGNOSIS — N2581 Secondary hyperparathyroidism of renal origin: Secondary | ICD-10-CM | POA: Diagnosis not present

## 2017-07-10 DIAGNOSIS — R1900 Intra-abdominal and pelvic swelling, mass and lump, unspecified site: Secondary | ICD-10-CM | POA: Diagnosis not present

## 2017-07-10 DIAGNOSIS — R1084 Generalized abdominal pain: Secondary | ICD-10-CM

## 2017-07-10 LAB — COMPREHENSIVE METABOLIC PANEL
ALBUMIN: 3.5 g/dL (ref 3.5–5.0)
ALT: 23 U/L (ref 14–54)
ANION GAP: 9 (ref 5–15)
AST: 44 U/L — ABNORMAL HIGH (ref 15–41)
Alkaline Phosphatase: 70 U/L (ref 38–126)
BUN: 34 mg/dL — ABNORMAL HIGH (ref 6–20)
CO2: 29 mmol/L (ref 22–32)
Calcium: 9.2 mg/dL (ref 8.9–10.3)
Chloride: 103 mmol/L (ref 101–111)
Creatinine, Ser: 3.66 mg/dL — ABNORMAL HIGH (ref 0.44–1.00)
GFR calc Af Amer: 15 mL/min — ABNORMAL LOW (ref >60)
GFR calc non Af Amer: 13 mL/min — ABNORMAL LOW (ref >60)
GLUCOSE: 106 mg/dL — AB (ref 65–99)
POTASSIUM: 3.3 mmol/L — AB (ref 3.5–5.1)
SODIUM: 141 mmol/L (ref 135–145)
Total Bilirubin: 0.4 mg/dL (ref 0.3–1.2)
Total Protein: 6.9 g/dL (ref 6.5–8.1)

## 2017-07-10 LAB — CBC
HEMATOCRIT: 33.7 % — AB (ref 36.0–46.0)
HEMOGLOBIN: 10.8 g/dL — AB (ref 12.0–15.0)
MCH: 29.6 pg (ref 26.0–34.0)
MCHC: 32 g/dL (ref 30.0–36.0)
MCV: 92.3 fL (ref 78.0–100.0)
Platelets: 299 10*3/uL (ref 150–400)
RBC: 3.65 MIL/uL — ABNORMAL LOW (ref 3.87–5.11)
RDW: 13.3 % (ref 11.5–15.5)
WBC: 7.1 10*3/uL (ref 4.0–10.5)

## 2017-07-10 LAB — URINALYSIS, ROUTINE W REFLEX MICROSCOPIC
BILIRUBIN URINE: NEGATIVE
Bacteria, UA: NONE SEEN
Glucose, UA: NEGATIVE mg/dL
Hgb urine dipstick: NEGATIVE
Ketones, ur: NEGATIVE mg/dL
Leukocytes, UA: NEGATIVE
NITRITE: NEGATIVE
Protein, ur: 100 mg/dL — AB
RBC / HPF: NONE SEEN RBC/hpf (ref 0–5)
SPECIFIC GRAVITY, URINE: 1.009 (ref 1.005–1.030)
pH: 7 (ref 5.0–8.0)

## 2017-07-10 LAB — LIPASE, BLOOD: Lipase: 68 U/L — ABNORMAL HIGH (ref 11–51)

## 2017-07-10 MED ORDER — FENTANYL CITRATE (PF) 100 MCG/2ML IJ SOLN
50.0000 ug | Freq: Once | INTRAMUSCULAR | Status: AC
  Administered 2017-07-10: 50 ug via INTRAVENOUS
  Filled 2017-07-10: qty 2

## 2017-07-10 MED ORDER — LACTULOSE 10 GM/15ML PO SOLN
10.0000 g | Freq: Once | ORAL | Status: DC
  Filled 2017-07-10: qty 15

## 2017-07-10 MED ORDER — FENTANYL CITRATE (PF) 100 MCG/2ML IJ SOLN
25.0000 ug | Freq: Once | INTRAMUSCULAR | Status: AC
  Administered 2017-07-10: 25 ug via INTRAVENOUS
  Filled 2017-07-10: qty 2

## 2017-07-10 NOTE — ED Notes (Signed)
75MCG Fentanyl WIS w/ [place], Therapist, sports as witness.

## 2017-07-10 NOTE — ED Provider Notes (Signed)
[facility] EMERGENCY DEPARTMENT Provider Note   CSN: [id number] Arrival date & time: 07/09/17  2344     History   Chief Complaint Chief Complaint  Patient presents with  . Abdominal Pain  . Vascular Access Problem    HPI Maureen Barnes is a 56 y.o. female.  The history is provided by the patient.  Abdominal Pain   This is a new problem. The current episode started more than 2 days ago. The problem occurs daily. The problem has been gradually worsening. The pain is located in the generalized abdominal region. The pain is moderate. Pertinent negatives include fever, diarrhea, hematochezia, nausea and vomiting. Nothing aggravates the symptoms. Nothing relieves the symptoms.  History of renal failure, has peritoneal dialysis catheter in place She had her PD catheter changed on December 13 It has been functioning properly until last night when she placed fluid and would not drain She denies any fevers or vomiting, but she feels like her abdominal cavity is full of fluid She admits that her abdominal pain is actually been present since the surgery on December 13 The surgery was done at [facility] from Dr. Raul Del Past Medical History:  Diagnosis Date  . Anemia   . Diabetes mellitus   . Hep C w/o coma, chronic ([place])   . Hypertension     Patient Active Problem List   Diagnosis Date Noted  . Hyperlipidemia 01/22/2017  . Palpitations 01/22/2017  . Dyspnea on exertion 01/22/2017  . CKD (chronic kidney disease) stage 3, GFR 30-59 ml/min (HCC) 10/18/2016  . Non-compliance 10/18/2016  . Chronic ethmoidal sinusitis 10/18/2016  . Hepatitis C, chronic ([place]) 05/15/2012  . Diabetes mellitus type 2, insulin dependent ([place]) 05/15/2012  . Hypertension 05/15/2012  . Anemia 05/15/2012    Past Surgical History:  Procedure Laterality Date  . TUBAL LIGATION      OB History    Gravida Para Term Preterm AB Living   3 3 3     3    SAB TAB Ectopic  Multiple Live Births                   Home Medications    Prior to Admission medications   Medication Sig Start Date End Date Taking? Authorizing Provider  amLODipine (NORVASC) 10 MG tablet Take 10 mg by mouth daily.     Yes [provider]  aspirin EC 81 MG tablet Take 81 mg by mouth daily.   Yes [provider]  carvedilol (COREG) 12.5 MG tablet Take 1.5 tablets (18.75 mg total) by mouth 2 (two) times daily with a meal. 04/24/17  Yes Lorretta Harp, MD  cyclobenzaprine (FLEXERIL) 10 MG tablet Take 1 tablet by mouth as directed.   Yes [provider]  diphenhydrAMINE (BENADRYL) 25 MG tablet Take 1 tablet (25 mg total) by mouth at bedtime as needed. 11/12/16  Yes Clent Demark, PA-C  ferrous sulfate 325 (65 FE) MG EC tablet Take 325 mg by mouth 2 (two) times daily.   Yes [provider]  Insulin Glargine (LANTUS SOLOSTAR [place]) Inject 26 Units into the skin daily.   Yes [provider]  loratadine (CLARITIN) 10 MG tablet Take 10 mg by mouth as directed.   Yes [provider]  losartan-hydrochlorothiazide (HYZAAR) 100-25 MG tablet Take 1 tablet by mouth daily. 10/18/16  Yes Clent Demark, PA-C  pravastatin (PRAVACHOL) 40 MG tablet Take 40 mg by mouth daily.   Yes [provider]  SUMAtriptan (IMITREX) 25 MG tablet Take 25 mg by mouth every 2 (two) hours as needed for migraine. May repeat in 2 hours if headache persists or recurs.    [provider]    Family History Family History  Problem Relation Age of Onset  . Hypertension Mother   . Cancer Mother   . Diabetes Father   . Hypertension Father     Social History Social History   Tobacco Use  . Smoking status: Never Smoker  . Smokeless tobacco: Never Used  Substance Use Topics  . Alcohol use: No  . Drug use: No     Allergies   Iodine; Peanut-containing drug products; and Shellfish allergy   Review of Systems Review of Systems    Constitutional: Negative for fever.  Gastrointestinal: Positive for abdominal pain. Negative for diarrhea, hematochezia, nausea and vomiting.  All other systems reviewed and are negative.    Physical Exam Updated Vital Signs BP (!) 178/105 (BP Location: Left Arm)   Pulse 79   Temp 98 F (36.7 C) (Oral)   Resp 18   LMP 07/10/2011   SpO2 100%   Physical Exam CONSTITUTIONAL: Well developed/well nourished HEAD: Normocephalic/atraumatic EYES: EOMI/PERRL ENMT: Mucous membranes moist NECK: supple no meningeal signs SPINE/BACK:no cspine tenderness CV: S1/S2 noted, no murmurs/rubs/gallops noted LUNGS: Lungs are clear to auscultation bilaterally, no apparent distress ABDOMEN: soft, PD catheter in place, diffuse mild tenderness, distention noted multiple bandages noted throughout abdominal wall, but no erythema or drainage GU:no cva tenderness NEURO: Pt is awake/alert/appropriate, moves all extremitiesx4.  No facial droop.   EXTREMITIES: pulses normal/equal, full ROM SKIN: warm, color normal PSYCH: no abnormalities of mood noted, alert and oriented to situation   ED Treatments / Results  Labs (all labs ordered are listed, but only abnormal results are displayed) Labs Reviewed  LIPASE, BLOOD - Abnormal; Notable for the following components:      Result Value   Lipase 68 (*)    All other components within normal limits  COMPREHENSIVE METABOLIC PANEL - Abnormal; Notable for the following components:   Potassium 3.3 (*)    Glucose, Bld 106 (*)    BUN 34 (*)    Creatinine, Ser 3.66 (*)    AST 44 (*)    GFR calc non Af Amer 13 (*)    GFR calc Af Amer 15 (*)    All other components within normal limits  CBC - Abnormal; Notable for the following components:   RBC 3.65 (*)    Hemoglobin 10.8 (*)    HCT 33.7 (*)    All other components within normal limits  URINALYSIS, ROUTINE W REFLEX MICROSCOPIC - Abnormal; Notable for the following components:   Color, Urine STRAW (*)     Protein, ur 100 (*)    Squamous Epithelial / LPF 0-5 (*)    All other components within normal limits    EKG  EKG Interpretation None       Radiology Dg Abdomen 1 View  Result Date: 07/10/2017 CLINICAL DATA:  Abdominal pain and swelling EXAM: ABDOMEN - 1 VIEW COMPARISON:  June 10, 2017 FINDINGS: There is a peritoneal dialysis catheter with tip in mid pelvis. There is moderate air in the colon. There is no bowel dilatation or air-fluid level to suggest bowel obstruction. No free air. There are phleboliths in the pelvis. Visualized lung bases are clear. IMPRESSION: Dialysis catheter tip in mid pelvis. No bowel obstruction or free air evident. Moderate stool in colon. Electronically Signed  By: Lowella Grip III M.D.   On: 07/10/2017 02:12    Procedures Procedures (including critical care time)  Medications Ordered in ED Medications  fentaNYL (SUBLIMAZE) injection 25 mcg (not administered)  fentaNYL (SUBLIMAZE) injection 50 mcg (50 mcg Intravenous Given 07/10/17 0136)     Initial Impression / Assessment and Plan / ED Course  I have reviewed the triage vital signs and the nursing notes.  Pertinent labs & imaging results that were available during my care of the patient were reviewed by me and considered in my medical decision making (see chart for details).     1:36 AM I discussed case with Dr. Lorrene Reid with nephrology She spoke to home nursing who was managed patient urrently patient has had continued abdominal pain since the surgery on December 13 She now has fluid in her abdomen but visible drain however the pain has been persistent for days We will proceed with KUB to look for catheter placement [facility] also recommends a dose of lactulose to help change positioning (pt already taking lactulose) If there are no acute issues on labs or imaging, she can be discharged that she has follow-up with her surgeon later today 3:16 AM Stable and resting comfortably X-ray results  noted, lab results noted I Feel she is stable for discharge, and will follow-up with her surgeon later today She is comfortable with this plan She admits his pain is been ongoing since the surgery, less likely that this is SBP or other acute abdominal emergency Final Clinical Impressions(s) / ED Diagnoses   Final diagnoses:  Generalized abdominal pain  Other mechanical complication of intraperitoneal dialysis catheter, initial encounter [facility])    ED Discharge Orders    None       Ripley Fraise, MD 07/10/17 [phone]

## 2017-07-10 NOTE — ED Triage Notes (Signed)
Pt had abdominal surgery on 07/04/17 regarding repositioning of PD.  Today she began to do the fill but is unable to drain.  She has tried to do it manually and w/ the cycler and was unsuccessful.  Pt is experiencing great abdominal pain.

## 2017-07-11 ENCOUNTER — Encounter: Payer: Self-pay | Admitting: Advanced Practice Midwife

## 2017-07-11 DIAGNOSIS — N186 End stage renal disease: Secondary | ICD-10-CM | POA: Diagnosis not present

## 2017-07-11 DIAGNOSIS — D509 Iron deficiency anemia, unspecified: Secondary | ICD-10-CM | POA: Diagnosis not present

## 2017-07-11 DIAGNOSIS — D631 Anemia in chronic kidney disease: Secondary | ICD-10-CM | POA: Diagnosis not present

## 2017-07-11 DIAGNOSIS — Z79899 Other long term (current) drug therapy: Secondary | ICD-10-CM | POA: Diagnosis not present

## 2017-07-11 DIAGNOSIS — R880 Cloudy (hemodialysis) (peritoneal) dialysis effluent: Secondary | ICD-10-CM | POA: Diagnosis not present

## 2017-07-11 DIAGNOSIS — N2581 Secondary hyperparathyroidism of renal origin: Secondary | ICD-10-CM | POA: Diagnosis not present

## 2017-07-12 DIAGNOSIS — R880 Cloudy (hemodialysis) (peritoneal) dialysis effluent: Secondary | ICD-10-CM | POA: Diagnosis not present

## 2017-07-12 DIAGNOSIS — N2581 Secondary hyperparathyroidism of renal origin: Secondary | ICD-10-CM | POA: Diagnosis not present

## 2017-07-12 DIAGNOSIS — D631 Anemia in chronic kidney disease: Secondary | ICD-10-CM | POA: Diagnosis not present

## 2017-07-12 DIAGNOSIS — D509 Iron deficiency anemia, unspecified: Secondary | ICD-10-CM | POA: Diagnosis not present

## 2017-07-12 DIAGNOSIS — N186 End stage renal disease: Secondary | ICD-10-CM | POA: Diagnosis not present

## 2017-07-12 DIAGNOSIS — Z79899 Other long term (current) drug therapy: Secondary | ICD-10-CM | POA: Diagnosis not present

## 2017-07-13 DIAGNOSIS — R880 Cloudy (hemodialysis) (peritoneal) dialysis effluent: Secondary | ICD-10-CM | POA: Diagnosis not present

## 2017-07-13 DIAGNOSIS — D631 Anemia in chronic kidney disease: Secondary | ICD-10-CM | POA: Diagnosis not present

## 2017-07-13 DIAGNOSIS — D509 Iron deficiency anemia, unspecified: Secondary | ICD-10-CM | POA: Diagnosis not present

## 2017-07-13 DIAGNOSIS — Z79899 Other long term (current) drug therapy: Secondary | ICD-10-CM | POA: Diagnosis not present

## 2017-07-13 DIAGNOSIS — N2581 Secondary hyperparathyroidism of renal origin: Secondary | ICD-10-CM | POA: Diagnosis not present

## 2017-07-13 DIAGNOSIS — N186 End stage renal disease: Secondary | ICD-10-CM | POA: Diagnosis not present

## 2017-07-14 DIAGNOSIS — N2581 Secondary hyperparathyroidism of renal origin: Secondary | ICD-10-CM | POA: Diagnosis not present

## 2017-07-14 DIAGNOSIS — D631 Anemia in chronic kidney disease: Secondary | ICD-10-CM | POA: Diagnosis not present

## 2017-07-14 DIAGNOSIS — D509 Iron deficiency anemia, unspecified: Secondary | ICD-10-CM | POA: Diagnosis not present

## 2017-07-14 DIAGNOSIS — R880 Cloudy (hemodialysis) (peritoneal) dialysis effluent: Secondary | ICD-10-CM | POA: Diagnosis not present

## 2017-07-14 DIAGNOSIS — N186 End stage renal disease: Secondary | ICD-10-CM | POA: Diagnosis not present

## 2017-07-14 DIAGNOSIS — Z79899 Other long term (current) drug therapy: Secondary | ICD-10-CM | POA: Diagnosis not present

## 2017-07-15 DIAGNOSIS — D631 Anemia in chronic kidney disease: Secondary | ICD-10-CM | POA: Diagnosis not present

## 2017-07-15 DIAGNOSIS — N186 End stage renal disease: Secondary | ICD-10-CM | POA: Diagnosis not present

## 2017-07-15 DIAGNOSIS — R880 Cloudy (hemodialysis) (peritoneal) dialysis effluent: Secondary | ICD-10-CM | POA: Diagnosis not present

## 2017-07-15 DIAGNOSIS — Z79899 Other long term (current) drug therapy: Secondary | ICD-10-CM | POA: Diagnosis not present

## 2017-07-15 DIAGNOSIS — N2581 Secondary hyperparathyroidism of renal origin: Secondary | ICD-10-CM | POA: Diagnosis not present

## 2017-07-15 DIAGNOSIS — D509 Iron deficiency anemia, unspecified: Secondary | ICD-10-CM | POA: Diagnosis not present

## 2017-07-16 DIAGNOSIS — R880 Cloudy (hemodialysis) (peritoneal) dialysis effluent: Secondary | ICD-10-CM | POA: Diagnosis not present

## 2017-07-16 DIAGNOSIS — D631 Anemia in chronic kidney disease: Secondary | ICD-10-CM | POA: Diagnosis not present

## 2017-07-16 DIAGNOSIS — N2581 Secondary hyperparathyroidism of renal origin: Secondary | ICD-10-CM | POA: Diagnosis not present

## 2017-07-16 DIAGNOSIS — N186 End stage renal disease: Secondary | ICD-10-CM | POA: Diagnosis not present

## 2017-07-16 DIAGNOSIS — Z79899 Other long term (current) drug therapy: Secondary | ICD-10-CM | POA: Diagnosis not present

## 2017-07-16 DIAGNOSIS — D509 Iron deficiency anemia, unspecified: Secondary | ICD-10-CM | POA: Diagnosis not present

## 2017-07-17 DIAGNOSIS — D509 Iron deficiency anemia, unspecified: Secondary | ICD-10-CM | POA: Diagnosis not present

## 2017-07-17 DIAGNOSIS — R880 Cloudy (hemodialysis) (peritoneal) dialysis effluent: Secondary | ICD-10-CM | POA: Diagnosis not present

## 2017-07-17 DIAGNOSIS — Z79899 Other long term (current) drug therapy: Secondary | ICD-10-CM | POA: Diagnosis not present

## 2017-07-17 DIAGNOSIS — N186 End stage renal disease: Secondary | ICD-10-CM | POA: Diagnosis not present

## 2017-07-17 DIAGNOSIS — N2581 Secondary hyperparathyroidism of renal origin: Secondary | ICD-10-CM | POA: Diagnosis not present

## 2017-07-17 DIAGNOSIS — D631 Anemia in chronic kidney disease: Secondary | ICD-10-CM | POA: Diagnosis not present

## 2017-07-18 ENCOUNTER — Encounter: Payer: Self-pay | Admitting: Obstetrics and Gynecology

## 2017-07-18 DIAGNOSIS — D631 Anemia in chronic kidney disease: Secondary | ICD-10-CM | POA: Diagnosis not present

## 2017-07-18 DIAGNOSIS — N2581 Secondary hyperparathyroidism of renal origin: Secondary | ICD-10-CM | POA: Diagnosis not present

## 2017-07-18 DIAGNOSIS — Z79899 Other long term (current) drug therapy: Secondary | ICD-10-CM | POA: Diagnosis not present

## 2017-07-18 DIAGNOSIS — N186 End stage renal disease: Secondary | ICD-10-CM | POA: Diagnosis not present

## 2017-07-18 DIAGNOSIS — R880 Cloudy (hemodialysis) (peritoneal) dialysis effluent: Secondary | ICD-10-CM | POA: Diagnosis not present

## 2017-07-18 DIAGNOSIS — D509 Iron deficiency anemia, unspecified: Secondary | ICD-10-CM | POA: Diagnosis not present

## 2017-07-19 DIAGNOSIS — N186 End stage renal disease: Secondary | ICD-10-CM | POA: Diagnosis not present

## 2017-07-19 DIAGNOSIS — N2581 Secondary hyperparathyroidism of renal origin: Secondary | ICD-10-CM | POA: Diagnosis not present

## 2017-07-19 DIAGNOSIS — R880 Cloudy (hemodialysis) (peritoneal) dialysis effluent: Secondary | ICD-10-CM | POA: Diagnosis not present

## 2017-07-19 DIAGNOSIS — D631 Anemia in chronic kidney disease: Secondary | ICD-10-CM | POA: Diagnosis not present

## 2017-07-19 DIAGNOSIS — D509 Iron deficiency anemia, unspecified: Secondary | ICD-10-CM | POA: Diagnosis not present

## 2017-07-19 DIAGNOSIS — Z79899 Other long term (current) drug therapy: Secondary | ICD-10-CM | POA: Diagnosis not present

## 2017-07-20 DIAGNOSIS — D631 Anemia in chronic kidney disease: Secondary | ICD-10-CM | POA: Diagnosis not present

## 2017-07-20 DIAGNOSIS — N186 End stage renal disease: Secondary | ICD-10-CM | POA: Diagnosis not present

## 2017-07-20 DIAGNOSIS — Z79899 Other long term (current) drug therapy: Secondary | ICD-10-CM | POA: Diagnosis not present

## 2017-07-20 DIAGNOSIS — R880 Cloudy (hemodialysis) (peritoneal) dialysis effluent: Secondary | ICD-10-CM | POA: Diagnosis not present

## 2017-07-20 DIAGNOSIS — D509 Iron deficiency anemia, unspecified: Secondary | ICD-10-CM | POA: Diagnosis not present

## 2017-07-20 DIAGNOSIS — N2581 Secondary hyperparathyroidism of renal origin: Secondary | ICD-10-CM | POA: Diagnosis not present

## 2017-07-21 DIAGNOSIS — Z79899 Other long term (current) drug therapy: Secondary | ICD-10-CM | POA: Diagnosis not present

## 2017-07-21 DIAGNOSIS — N186 End stage renal disease: Secondary | ICD-10-CM | POA: Diagnosis not present

## 2017-07-21 DIAGNOSIS — N2581 Secondary hyperparathyroidism of renal origin: Secondary | ICD-10-CM | POA: Diagnosis not present

## 2017-07-21 DIAGNOSIS — D509 Iron deficiency anemia, unspecified: Secondary | ICD-10-CM | POA: Diagnosis not present

## 2017-07-21 DIAGNOSIS — R880 Cloudy (hemodialysis) (peritoneal) dialysis effluent: Secondary | ICD-10-CM | POA: Diagnosis not present

## 2017-07-21 DIAGNOSIS — D631 Anemia in chronic kidney disease: Secondary | ICD-10-CM | POA: Diagnosis not present

## 2017-07-22 DIAGNOSIS — D631 Anemia in chronic kidney disease: Secondary | ICD-10-CM | POA: Diagnosis not present

## 2017-07-22 DIAGNOSIS — D509 Iron deficiency anemia, unspecified: Secondary | ICD-10-CM | POA: Diagnosis not present

## 2017-07-22 DIAGNOSIS — I129 Hypertensive chronic kidney disease with stage 1 through stage 4 chronic kidney disease, or unspecified chronic kidney disease: Secondary | ICD-10-CM | POA: Diagnosis not present

## 2017-07-22 DIAGNOSIS — N2581 Secondary hyperparathyroidism of renal origin: Secondary | ICD-10-CM | POA: Diagnosis not present

## 2017-07-22 DIAGNOSIS — R880 Cloudy (hemodialysis) (peritoneal) dialysis effluent: Secondary | ICD-10-CM | POA: Diagnosis not present

## 2017-07-22 DIAGNOSIS — N186 End stage renal disease: Secondary | ICD-10-CM | POA: Diagnosis not present

## 2017-07-22 DIAGNOSIS — Z79899 Other long term (current) drug therapy: Secondary | ICD-10-CM | POA: Diagnosis not present

## 2017-07-22 DIAGNOSIS — N185 Chronic kidney disease, stage 5: Secondary | ICD-10-CM | POA: Diagnosis not present

## 2017-07-22 DIAGNOSIS — Z992 Dependence on renal dialysis: Secondary | ICD-10-CM | POA: Diagnosis not present

## 2017-07-23 DIAGNOSIS — N2589 Other disorders resulting from impaired renal tubular function: Secondary | ICD-10-CM | POA: Diagnosis not present

## 2017-07-23 DIAGNOSIS — N2581 Secondary hyperparathyroidism of renal origin: Secondary | ICD-10-CM | POA: Diagnosis not present

## 2017-07-23 DIAGNOSIS — Z4932 Encounter for adequacy testing for peritoneal dialysis: Secondary | ICD-10-CM | POA: Diagnosis not present

## 2017-07-23 DIAGNOSIS — N186 End stage renal disease: Secondary | ICD-10-CM | POA: Diagnosis not present

## 2017-07-23 DIAGNOSIS — D509 Iron deficiency anemia, unspecified: Secondary | ICD-10-CM | POA: Diagnosis not present

## 2017-07-23 DIAGNOSIS — D631 Anemia in chronic kidney disease: Secondary | ICD-10-CM | POA: Diagnosis not present

## 2017-07-23 DIAGNOSIS — Z23 Encounter for immunization: Secondary | ICD-10-CM | POA: Diagnosis not present

## 2017-07-23 DIAGNOSIS — K769 Liver disease, unspecified: Secondary | ICD-10-CM | POA: Diagnosis not present

## 2017-07-24 DIAGNOSIS — D509 Iron deficiency anemia, unspecified: Secondary | ICD-10-CM | POA: Diagnosis not present

## 2017-07-24 DIAGNOSIS — N2581 Secondary hyperparathyroidism of renal origin: Secondary | ICD-10-CM | POA: Diagnosis not present

## 2017-07-24 DIAGNOSIS — N186 End stage renal disease: Secondary | ICD-10-CM | POA: Diagnosis not present

## 2017-07-24 DIAGNOSIS — N2589 Other disorders resulting from impaired renal tubular function: Secondary | ICD-10-CM | POA: Diagnosis not present

## 2017-07-24 DIAGNOSIS — K769 Liver disease, unspecified: Secondary | ICD-10-CM | POA: Diagnosis not present

## 2017-07-24 DIAGNOSIS — D631 Anemia in chronic kidney disease: Secondary | ICD-10-CM | POA: Diagnosis not present

## 2017-07-25 DIAGNOSIS — D631 Anemia in chronic kidney disease: Secondary | ICD-10-CM | POA: Diagnosis not present

## 2017-07-25 DIAGNOSIS — N186 End stage renal disease: Secondary | ICD-10-CM | POA: Diagnosis not present

## 2017-07-25 DIAGNOSIS — K769 Liver disease, unspecified: Secondary | ICD-10-CM | POA: Diagnosis not present

## 2017-07-25 DIAGNOSIS — D509 Iron deficiency anemia, unspecified: Secondary | ICD-10-CM | POA: Diagnosis not present

## 2017-07-25 DIAGNOSIS — N2589 Other disorders resulting from impaired renal tubular function: Secondary | ICD-10-CM | POA: Diagnosis not present

## 2017-07-25 DIAGNOSIS — N2581 Secondary hyperparathyroidism of renal origin: Secondary | ICD-10-CM | POA: Diagnosis not present

## 2017-07-25 DIAGNOSIS — K4090 Unilateral inguinal hernia, without obstruction or gangrene, not specified as recurrent: Secondary | ICD-10-CM | POA: Diagnosis not present

## 2017-07-26 DIAGNOSIS — N2581 Secondary hyperparathyroidism of renal origin: Secondary | ICD-10-CM | POA: Diagnosis not present

## 2017-07-26 DIAGNOSIS — N186 End stage renal disease: Secondary | ICD-10-CM | POA: Diagnosis not present

## 2017-07-26 DIAGNOSIS — K769 Liver disease, unspecified: Secondary | ICD-10-CM | POA: Diagnosis not present

## 2017-07-26 DIAGNOSIS — N2589 Other disorders resulting from impaired renal tubular function: Secondary | ICD-10-CM | POA: Diagnosis not present

## 2017-07-26 DIAGNOSIS — D509 Iron deficiency anemia, unspecified: Secondary | ICD-10-CM | POA: Diagnosis not present

## 2017-07-26 DIAGNOSIS — D631 Anemia in chronic kidney disease: Secondary | ICD-10-CM | POA: Diagnosis not present

## 2017-07-27 DIAGNOSIS — K769 Liver disease, unspecified: Secondary | ICD-10-CM | POA: Diagnosis not present

## 2017-07-27 DIAGNOSIS — N2581 Secondary hyperparathyroidism of renal origin: Secondary | ICD-10-CM | POA: Diagnosis not present

## 2017-07-27 DIAGNOSIS — N2589 Other disorders resulting from impaired renal tubular function: Secondary | ICD-10-CM | POA: Diagnosis not present

## 2017-07-27 DIAGNOSIS — D631 Anemia in chronic kidney disease: Secondary | ICD-10-CM | POA: Diagnosis not present

## 2017-07-27 DIAGNOSIS — D509 Iron deficiency anemia, unspecified: Secondary | ICD-10-CM | POA: Diagnosis not present

## 2017-07-27 DIAGNOSIS — N186 End stage renal disease: Secondary | ICD-10-CM | POA: Diagnosis not present

## 2017-07-28 DIAGNOSIS — N2581 Secondary hyperparathyroidism of renal origin: Secondary | ICD-10-CM | POA: Diagnosis not present

## 2017-07-28 DIAGNOSIS — N2589 Other disorders resulting from impaired renal tubular function: Secondary | ICD-10-CM | POA: Diagnosis not present

## 2017-07-28 DIAGNOSIS — N186 End stage renal disease: Secondary | ICD-10-CM | POA: Diagnosis not present

## 2017-07-28 DIAGNOSIS — D631 Anemia in chronic kidney disease: Secondary | ICD-10-CM | POA: Diagnosis not present

## 2017-07-28 DIAGNOSIS — D509 Iron deficiency anemia, unspecified: Secondary | ICD-10-CM | POA: Diagnosis not present

## 2017-07-28 DIAGNOSIS — K769 Liver disease, unspecified: Secondary | ICD-10-CM | POA: Diagnosis not present

## 2017-07-29 DIAGNOSIS — N185 Chronic kidney disease, stage 5: Secondary | ICD-10-CM | POA: Diagnosis not present

## 2017-07-29 DIAGNOSIS — N2581 Secondary hyperparathyroidism of renal origin: Secondary | ICD-10-CM | POA: Diagnosis not present

## 2017-07-29 DIAGNOSIS — E119 Type 2 diabetes mellitus without complications: Secondary | ICD-10-CM | POA: Diagnosis not present

## 2017-07-29 DIAGNOSIS — T85611A Breakdown (mechanical) of intraperitoneal dialysis catheter, initial encounter: Secondary | ICD-10-CM | POA: Diagnosis not present

## 2017-07-29 DIAGNOSIS — D631 Anemia in chronic kidney disease: Secondary | ICD-10-CM | POA: Diagnosis not present

## 2017-07-29 DIAGNOSIS — K769 Liver disease, unspecified: Secondary | ICD-10-CM | POA: Diagnosis not present

## 2017-07-29 DIAGNOSIS — D509 Iron deficiency anemia, unspecified: Secondary | ICD-10-CM | POA: Diagnosis not present

## 2017-07-29 DIAGNOSIS — N2589 Other disorders resulting from impaired renal tubular function: Secondary | ICD-10-CM | POA: Diagnosis not present

## 2017-07-29 DIAGNOSIS — N186 End stage renal disease: Secondary | ICD-10-CM | POA: Diagnosis not present

## 2017-07-30 DIAGNOSIS — D509 Iron deficiency anemia, unspecified: Secondary | ICD-10-CM | POA: Diagnosis not present

## 2017-07-30 DIAGNOSIS — K769 Liver disease, unspecified: Secondary | ICD-10-CM | POA: Diagnosis not present

## 2017-07-30 DIAGNOSIS — D631 Anemia in chronic kidney disease: Secondary | ICD-10-CM | POA: Diagnosis not present

## 2017-07-30 DIAGNOSIS — E7849 Other hyperlipidemia: Secondary | ICD-10-CM | POA: Diagnosis not present

## 2017-07-30 DIAGNOSIS — N2581 Secondary hyperparathyroidism of renal origin: Secondary | ICD-10-CM | POA: Diagnosis not present

## 2017-07-30 DIAGNOSIS — E1129 Type 2 diabetes mellitus with other diabetic kidney complication: Secondary | ICD-10-CM | POA: Diagnosis not present

## 2017-07-30 DIAGNOSIS — N186 End stage renal disease: Secondary | ICD-10-CM | POA: Diagnosis not present

## 2017-07-30 DIAGNOSIS — R82994 Hypercalciuria: Secondary | ICD-10-CM | POA: Diagnosis not present

## 2017-07-30 DIAGNOSIS — N2589 Other disorders resulting from impaired renal tubular function: Secondary | ICD-10-CM | POA: Diagnosis not present

## 2017-07-31 DIAGNOSIS — K769 Liver disease, unspecified: Secondary | ICD-10-CM | POA: Diagnosis not present

## 2017-07-31 DIAGNOSIS — D631 Anemia in chronic kidney disease: Secondary | ICD-10-CM | POA: Diagnosis not present

## 2017-07-31 DIAGNOSIS — N2581 Secondary hyperparathyroidism of renal origin: Secondary | ICD-10-CM | POA: Diagnosis not present

## 2017-07-31 DIAGNOSIS — N2589 Other disorders resulting from impaired renal tubular function: Secondary | ICD-10-CM | POA: Diagnosis not present

## 2017-07-31 DIAGNOSIS — N186 End stage renal disease: Secondary | ICD-10-CM | POA: Diagnosis not present

## 2017-07-31 DIAGNOSIS — D509 Iron deficiency anemia, unspecified: Secondary | ICD-10-CM | POA: Diagnosis not present

## 2017-08-01 DIAGNOSIS — N2589 Other disorders resulting from impaired renal tubular function: Secondary | ICD-10-CM | POA: Diagnosis not present

## 2017-08-01 DIAGNOSIS — D509 Iron deficiency anemia, unspecified: Secondary | ICD-10-CM | POA: Diagnosis not present

## 2017-08-01 DIAGNOSIS — N2581 Secondary hyperparathyroidism of renal origin: Secondary | ICD-10-CM | POA: Diagnosis not present

## 2017-08-01 DIAGNOSIS — D631 Anemia in chronic kidney disease: Secondary | ICD-10-CM | POA: Diagnosis not present

## 2017-08-01 DIAGNOSIS — K769 Liver disease, unspecified: Secondary | ICD-10-CM | POA: Diagnosis not present

## 2017-08-01 DIAGNOSIS — N186 End stage renal disease: Secondary | ICD-10-CM | POA: Diagnosis not present

## 2017-08-02 DIAGNOSIS — N2589 Other disorders resulting from impaired renal tubular function: Secondary | ICD-10-CM | POA: Diagnosis not present

## 2017-08-02 DIAGNOSIS — N2581 Secondary hyperparathyroidism of renal origin: Secondary | ICD-10-CM | POA: Diagnosis not present

## 2017-08-02 DIAGNOSIS — N186 End stage renal disease: Secondary | ICD-10-CM | POA: Diagnosis not present

## 2017-08-02 DIAGNOSIS — D509 Iron deficiency anemia, unspecified: Secondary | ICD-10-CM | POA: Diagnosis not present

## 2017-08-02 DIAGNOSIS — K769 Liver disease, unspecified: Secondary | ICD-10-CM | POA: Diagnosis not present

## 2017-08-02 DIAGNOSIS — D631 Anemia in chronic kidney disease: Secondary | ICD-10-CM | POA: Diagnosis not present

## 2017-08-03 DIAGNOSIS — N2589 Other disorders resulting from impaired renal tubular function: Secondary | ICD-10-CM | POA: Diagnosis not present

## 2017-08-03 DIAGNOSIS — D509 Iron deficiency anemia, unspecified: Secondary | ICD-10-CM | POA: Diagnosis not present

## 2017-08-03 DIAGNOSIS — N186 End stage renal disease: Secondary | ICD-10-CM | POA: Diagnosis not present

## 2017-08-03 DIAGNOSIS — K769 Liver disease, unspecified: Secondary | ICD-10-CM | POA: Diagnosis not present

## 2017-08-03 DIAGNOSIS — D631 Anemia in chronic kidney disease: Secondary | ICD-10-CM | POA: Diagnosis not present

## 2017-08-03 DIAGNOSIS — N2581 Secondary hyperparathyroidism of renal origin: Secondary | ICD-10-CM | POA: Diagnosis not present

## 2017-08-04 DIAGNOSIS — D631 Anemia in chronic kidney disease: Secondary | ICD-10-CM | POA: Diagnosis not present

## 2017-08-04 DIAGNOSIS — N2581 Secondary hyperparathyroidism of renal origin: Secondary | ICD-10-CM | POA: Diagnosis not present

## 2017-08-04 DIAGNOSIS — N2589 Other disorders resulting from impaired renal tubular function: Secondary | ICD-10-CM | POA: Diagnosis not present

## 2017-08-04 DIAGNOSIS — N186 End stage renal disease: Secondary | ICD-10-CM | POA: Diagnosis not present

## 2017-08-04 DIAGNOSIS — K769 Liver disease, unspecified: Secondary | ICD-10-CM | POA: Diagnosis not present

## 2017-08-04 DIAGNOSIS — D509 Iron deficiency anemia, unspecified: Secondary | ICD-10-CM | POA: Diagnosis not present

## 2017-08-05 DIAGNOSIS — N2581 Secondary hyperparathyroidism of renal origin: Secondary | ICD-10-CM | POA: Diagnosis not present

## 2017-08-05 DIAGNOSIS — K769 Liver disease, unspecified: Secondary | ICD-10-CM | POA: Diagnosis not present

## 2017-08-05 DIAGNOSIS — D631 Anemia in chronic kidney disease: Secondary | ICD-10-CM | POA: Diagnosis not present

## 2017-08-05 DIAGNOSIS — N186 End stage renal disease: Secondary | ICD-10-CM | POA: Diagnosis not present

## 2017-08-05 DIAGNOSIS — N2589 Other disorders resulting from impaired renal tubular function: Secondary | ICD-10-CM | POA: Diagnosis not present

## 2017-08-05 DIAGNOSIS — D509 Iron deficiency anemia, unspecified: Secondary | ICD-10-CM | POA: Diagnosis not present

## 2017-08-06 DIAGNOSIS — N2589 Other disorders resulting from impaired renal tubular function: Secondary | ICD-10-CM | POA: Diagnosis not present

## 2017-08-06 DIAGNOSIS — D509 Iron deficiency anemia, unspecified: Secondary | ICD-10-CM | POA: Diagnosis not present

## 2017-08-06 DIAGNOSIS — N186 End stage renal disease: Secondary | ICD-10-CM | POA: Diagnosis not present

## 2017-08-06 DIAGNOSIS — N2581 Secondary hyperparathyroidism of renal origin: Secondary | ICD-10-CM | POA: Diagnosis not present

## 2017-08-06 DIAGNOSIS — K769 Liver disease, unspecified: Secondary | ICD-10-CM | POA: Diagnosis not present

## 2017-08-06 DIAGNOSIS — D631 Anemia in chronic kidney disease: Secondary | ICD-10-CM | POA: Diagnosis not present

## 2017-08-07 DIAGNOSIS — K769 Liver disease, unspecified: Secondary | ICD-10-CM | POA: Diagnosis not present

## 2017-08-07 DIAGNOSIS — N2589 Other disorders resulting from impaired renal tubular function: Secondary | ICD-10-CM | POA: Diagnosis not present

## 2017-08-07 DIAGNOSIS — N186 End stage renal disease: Secondary | ICD-10-CM | POA: Diagnosis not present

## 2017-08-07 DIAGNOSIS — N2581 Secondary hyperparathyroidism of renal origin: Secondary | ICD-10-CM | POA: Diagnosis not present

## 2017-08-07 DIAGNOSIS — D631 Anemia in chronic kidney disease: Secondary | ICD-10-CM | POA: Diagnosis not present

## 2017-08-07 DIAGNOSIS — D509 Iron deficiency anemia, unspecified: Secondary | ICD-10-CM | POA: Diagnosis not present

## 2017-08-08 DIAGNOSIS — K769 Liver disease, unspecified: Secondary | ICD-10-CM | POA: Diagnosis not present

## 2017-08-08 DIAGNOSIS — N2589 Other disorders resulting from impaired renal tubular function: Secondary | ICD-10-CM | POA: Diagnosis not present

## 2017-08-08 DIAGNOSIS — N186 End stage renal disease: Secondary | ICD-10-CM | POA: Diagnosis not present

## 2017-08-08 DIAGNOSIS — D509 Iron deficiency anemia, unspecified: Secondary | ICD-10-CM | POA: Diagnosis not present

## 2017-08-08 DIAGNOSIS — N2581 Secondary hyperparathyroidism of renal origin: Secondary | ICD-10-CM | POA: Diagnosis not present

## 2017-08-08 DIAGNOSIS — D631 Anemia in chronic kidney disease: Secondary | ICD-10-CM | POA: Diagnosis not present

## 2017-08-09 DIAGNOSIS — D631 Anemia in chronic kidney disease: Secondary | ICD-10-CM | POA: Diagnosis not present

## 2017-08-09 DIAGNOSIS — N2589 Other disorders resulting from impaired renal tubular function: Secondary | ICD-10-CM | POA: Diagnosis not present

## 2017-08-09 DIAGNOSIS — K769 Liver disease, unspecified: Secondary | ICD-10-CM | POA: Diagnosis not present

## 2017-08-09 DIAGNOSIS — N186 End stage renal disease: Secondary | ICD-10-CM | POA: Diagnosis not present

## 2017-08-09 DIAGNOSIS — D509 Iron deficiency anemia, unspecified: Secondary | ICD-10-CM | POA: Diagnosis not present

## 2017-08-09 DIAGNOSIS — N2581 Secondary hyperparathyroidism of renal origin: Secondary | ICD-10-CM | POA: Diagnosis not present

## 2017-08-10 DIAGNOSIS — N186 End stage renal disease: Secondary | ICD-10-CM | POA: Diagnosis not present

## 2017-08-10 DIAGNOSIS — K769 Liver disease, unspecified: Secondary | ICD-10-CM | POA: Diagnosis not present

## 2017-08-10 DIAGNOSIS — N2581 Secondary hyperparathyroidism of renal origin: Secondary | ICD-10-CM | POA: Diagnosis not present

## 2017-08-10 DIAGNOSIS — N2589 Other disorders resulting from impaired renal tubular function: Secondary | ICD-10-CM | POA: Diagnosis not present

## 2017-08-10 DIAGNOSIS — D631 Anemia in chronic kidney disease: Secondary | ICD-10-CM | POA: Diagnosis not present

## 2017-08-10 DIAGNOSIS — D509 Iron deficiency anemia, unspecified: Secondary | ICD-10-CM | POA: Diagnosis not present

## 2017-08-11 DIAGNOSIS — N2581 Secondary hyperparathyroidism of renal origin: Secondary | ICD-10-CM | POA: Diagnosis not present

## 2017-08-11 DIAGNOSIS — N2589 Other disorders resulting from impaired renal tubular function: Secondary | ICD-10-CM | POA: Diagnosis not present

## 2017-08-11 DIAGNOSIS — D509 Iron deficiency anemia, unspecified: Secondary | ICD-10-CM | POA: Diagnosis not present

## 2017-08-11 DIAGNOSIS — K769 Liver disease, unspecified: Secondary | ICD-10-CM | POA: Diagnosis not present

## 2017-08-11 DIAGNOSIS — D631 Anemia in chronic kidney disease: Secondary | ICD-10-CM | POA: Diagnosis not present

## 2017-08-11 DIAGNOSIS — N186 End stage renal disease: Secondary | ICD-10-CM | POA: Diagnosis not present

## 2017-08-12 DIAGNOSIS — N2581 Secondary hyperparathyroidism of renal origin: Secondary | ICD-10-CM | POA: Diagnosis not present

## 2017-08-12 DIAGNOSIS — N2589 Other disorders resulting from impaired renal tubular function: Secondary | ICD-10-CM | POA: Diagnosis not present

## 2017-08-12 DIAGNOSIS — D631 Anemia in chronic kidney disease: Secondary | ICD-10-CM | POA: Diagnosis not present

## 2017-08-12 DIAGNOSIS — N186 End stage renal disease: Secondary | ICD-10-CM | POA: Diagnosis not present

## 2017-08-12 DIAGNOSIS — D509 Iron deficiency anemia, unspecified: Secondary | ICD-10-CM | POA: Diagnosis not present

## 2017-08-12 DIAGNOSIS — K769 Liver disease, unspecified: Secondary | ICD-10-CM | POA: Diagnosis not present

## 2017-08-13 DIAGNOSIS — D631 Anemia in chronic kidney disease: Secondary | ICD-10-CM | POA: Diagnosis not present

## 2017-08-13 DIAGNOSIS — K769 Liver disease, unspecified: Secondary | ICD-10-CM | POA: Diagnosis not present

## 2017-08-13 DIAGNOSIS — N186 End stage renal disease: Secondary | ICD-10-CM | POA: Diagnosis not present

## 2017-08-13 DIAGNOSIS — N2589 Other disorders resulting from impaired renal tubular function: Secondary | ICD-10-CM | POA: Diagnosis not present

## 2017-08-13 DIAGNOSIS — N2581 Secondary hyperparathyroidism of renal origin: Secondary | ICD-10-CM | POA: Diagnosis not present

## 2017-08-13 DIAGNOSIS — D509 Iron deficiency anemia, unspecified: Secondary | ICD-10-CM | POA: Diagnosis not present

## 2017-08-14 DIAGNOSIS — K769 Liver disease, unspecified: Secondary | ICD-10-CM | POA: Diagnosis not present

## 2017-08-14 DIAGNOSIS — N186 End stage renal disease: Secondary | ICD-10-CM | POA: Diagnosis not present

## 2017-08-14 DIAGNOSIS — N2581 Secondary hyperparathyroidism of renal origin: Secondary | ICD-10-CM | POA: Diagnosis not present

## 2017-08-14 DIAGNOSIS — D631 Anemia in chronic kidney disease: Secondary | ICD-10-CM | POA: Diagnosis not present

## 2017-08-14 DIAGNOSIS — N2589 Other disorders resulting from impaired renal tubular function: Secondary | ICD-10-CM | POA: Diagnosis not present

## 2017-08-14 DIAGNOSIS — D509 Iron deficiency anemia, unspecified: Secondary | ICD-10-CM | POA: Diagnosis not present

## 2017-08-15 DIAGNOSIS — N2589 Other disorders resulting from impaired renal tubular function: Secondary | ICD-10-CM | POA: Diagnosis not present

## 2017-08-15 DIAGNOSIS — D509 Iron deficiency anemia, unspecified: Secondary | ICD-10-CM | POA: Diagnosis not present

## 2017-08-15 DIAGNOSIS — N2581 Secondary hyperparathyroidism of renal origin: Secondary | ICD-10-CM | POA: Diagnosis not present

## 2017-08-15 DIAGNOSIS — N186 End stage renal disease: Secondary | ICD-10-CM | POA: Diagnosis not present

## 2017-08-15 DIAGNOSIS — K769 Liver disease, unspecified: Secondary | ICD-10-CM | POA: Diagnosis not present

## 2017-08-15 DIAGNOSIS — D631 Anemia in chronic kidney disease: Secondary | ICD-10-CM | POA: Diagnosis not present

## 2017-08-16 DIAGNOSIS — N2589 Other disorders resulting from impaired renal tubular function: Secondary | ICD-10-CM | POA: Diagnosis not present

## 2017-08-16 DIAGNOSIS — N2581 Secondary hyperparathyroidism of renal origin: Secondary | ICD-10-CM | POA: Diagnosis not present

## 2017-08-16 DIAGNOSIS — K769 Liver disease, unspecified: Secondary | ICD-10-CM | POA: Diagnosis not present

## 2017-08-16 DIAGNOSIS — D631 Anemia in chronic kidney disease: Secondary | ICD-10-CM | POA: Diagnosis not present

## 2017-08-16 DIAGNOSIS — D509 Iron deficiency anemia, unspecified: Secondary | ICD-10-CM | POA: Diagnosis not present

## 2017-08-16 DIAGNOSIS — N186 End stage renal disease: Secondary | ICD-10-CM | POA: Diagnosis not present

## 2017-08-17 DIAGNOSIS — N2589 Other disorders resulting from impaired renal tubular function: Secondary | ICD-10-CM | POA: Diagnosis not present

## 2017-08-17 DIAGNOSIS — N186 End stage renal disease: Secondary | ICD-10-CM | POA: Diagnosis not present

## 2017-08-17 DIAGNOSIS — K769 Liver disease, unspecified: Secondary | ICD-10-CM | POA: Diagnosis not present

## 2017-08-17 DIAGNOSIS — D509 Iron deficiency anemia, unspecified: Secondary | ICD-10-CM | POA: Diagnosis not present

## 2017-08-17 DIAGNOSIS — D631 Anemia in chronic kidney disease: Secondary | ICD-10-CM | POA: Diagnosis not present

## 2017-08-17 DIAGNOSIS — N2581 Secondary hyperparathyroidism of renal origin: Secondary | ICD-10-CM | POA: Diagnosis not present

## 2017-08-18 DIAGNOSIS — N186 End stage renal disease: Secondary | ICD-10-CM | POA: Diagnosis not present

## 2017-08-18 DIAGNOSIS — N2581 Secondary hyperparathyroidism of renal origin: Secondary | ICD-10-CM | POA: Diagnosis not present

## 2017-08-18 DIAGNOSIS — D509 Iron deficiency anemia, unspecified: Secondary | ICD-10-CM | POA: Diagnosis not present

## 2017-08-18 DIAGNOSIS — K769 Liver disease, unspecified: Secondary | ICD-10-CM | POA: Diagnosis not present

## 2017-08-18 DIAGNOSIS — D631 Anemia in chronic kidney disease: Secondary | ICD-10-CM | POA: Diagnosis not present

## 2017-08-18 DIAGNOSIS — N2589 Other disorders resulting from impaired renal tubular function: Secondary | ICD-10-CM | POA: Diagnosis not present

## 2017-08-19 DIAGNOSIS — N2581 Secondary hyperparathyroidism of renal origin: Secondary | ICD-10-CM | POA: Diagnosis not present

## 2017-08-19 DIAGNOSIS — D509 Iron deficiency anemia, unspecified: Secondary | ICD-10-CM | POA: Diagnosis not present

## 2017-08-19 DIAGNOSIS — K769 Liver disease, unspecified: Secondary | ICD-10-CM | POA: Diagnosis not present

## 2017-08-19 DIAGNOSIS — N2589 Other disorders resulting from impaired renal tubular function: Secondary | ICD-10-CM | POA: Diagnosis not present

## 2017-08-19 DIAGNOSIS — D631 Anemia in chronic kidney disease: Secondary | ICD-10-CM | POA: Diagnosis not present

## 2017-08-19 DIAGNOSIS — N186 End stage renal disease: Secondary | ICD-10-CM | POA: Diagnosis not present

## 2017-08-20 DIAGNOSIS — K769 Liver disease, unspecified: Secondary | ICD-10-CM | POA: Diagnosis not present

## 2017-08-20 DIAGNOSIS — N186 End stage renal disease: Secondary | ICD-10-CM | POA: Diagnosis not present

## 2017-08-20 DIAGNOSIS — D509 Iron deficiency anemia, unspecified: Secondary | ICD-10-CM | POA: Diagnosis not present

## 2017-08-20 DIAGNOSIS — N2581 Secondary hyperparathyroidism of renal origin: Secondary | ICD-10-CM | POA: Diagnosis not present

## 2017-08-20 DIAGNOSIS — D631 Anemia in chronic kidney disease: Secondary | ICD-10-CM | POA: Diagnosis not present

## 2017-08-20 DIAGNOSIS — N2589 Other disorders resulting from impaired renal tubular function: Secondary | ICD-10-CM | POA: Diagnosis not present

## 2017-08-21 DIAGNOSIS — D509 Iron deficiency anemia, unspecified: Secondary | ICD-10-CM | POA: Diagnosis not present

## 2017-08-21 DIAGNOSIS — N2589 Other disorders resulting from impaired renal tubular function: Secondary | ICD-10-CM | POA: Diagnosis not present

## 2017-08-21 DIAGNOSIS — D631 Anemia in chronic kidney disease: Secondary | ICD-10-CM | POA: Diagnosis not present

## 2017-08-21 DIAGNOSIS — K769 Liver disease, unspecified: Secondary | ICD-10-CM | POA: Diagnosis not present

## 2017-08-21 DIAGNOSIS — N2581 Secondary hyperparathyroidism of renal origin: Secondary | ICD-10-CM | POA: Diagnosis not present

## 2017-08-21 DIAGNOSIS — N186 End stage renal disease: Secondary | ICD-10-CM | POA: Diagnosis not present

## 2017-08-22 DIAGNOSIS — N2581 Secondary hyperparathyroidism of renal origin: Secondary | ICD-10-CM | POA: Diagnosis not present

## 2017-08-22 DIAGNOSIS — N2589 Other disorders resulting from impaired renal tubular function: Secondary | ICD-10-CM | POA: Diagnosis not present

## 2017-08-22 DIAGNOSIS — N186 End stage renal disease: Secondary | ICD-10-CM | POA: Diagnosis not present

## 2017-08-22 DIAGNOSIS — D509 Iron deficiency anemia, unspecified: Secondary | ICD-10-CM | POA: Diagnosis not present

## 2017-08-22 DIAGNOSIS — D631 Anemia in chronic kidney disease: Secondary | ICD-10-CM | POA: Diagnosis not present

## 2017-08-22 DIAGNOSIS — Z992 Dependence on renal dialysis: Secondary | ICD-10-CM | POA: Diagnosis not present

## 2017-08-22 DIAGNOSIS — K769 Liver disease, unspecified: Secondary | ICD-10-CM | POA: Diagnosis not present

## 2017-08-22 DIAGNOSIS — I129 Hypertensive chronic kidney disease with stage 1 through stage 4 chronic kidney disease, or unspecified chronic kidney disease: Secondary | ICD-10-CM | POA: Diagnosis not present

## 2017-08-23 DIAGNOSIS — D509 Iron deficiency anemia, unspecified: Secondary | ICD-10-CM | POA: Diagnosis not present

## 2017-08-23 DIAGNOSIS — N2581 Secondary hyperparathyroidism of renal origin: Secondary | ICD-10-CM | POA: Diagnosis not present

## 2017-08-23 DIAGNOSIS — Z79899 Other long term (current) drug therapy: Secondary | ICD-10-CM | POA: Diagnosis not present

## 2017-08-23 DIAGNOSIS — Z4932 Encounter for adequacy testing for peritoneal dialysis: Secondary | ICD-10-CM | POA: Diagnosis not present

## 2017-08-23 DIAGNOSIS — I129 Hypertensive chronic kidney disease with stage 1 through stage 4 chronic kidney disease, or unspecified chronic kidney disease: Secondary | ICD-10-CM | POA: Diagnosis not present

## 2017-08-23 DIAGNOSIS — N186 End stage renal disease: Secondary | ICD-10-CM | POA: Diagnosis not present

## 2017-08-23 DIAGNOSIS — R17 Unspecified jaundice: Secondary | ICD-10-CM | POA: Diagnosis not present

## 2017-08-23 DIAGNOSIS — K769 Liver disease, unspecified: Secondary | ICD-10-CM | POA: Diagnosis not present

## 2017-08-23 DIAGNOSIS — E440 Moderate protein-calorie malnutrition: Secondary | ICD-10-CM | POA: Diagnosis not present

## 2017-08-23 DIAGNOSIS — Z992 Dependence on renal dialysis: Secondary | ICD-10-CM | POA: Diagnosis not present

## 2017-08-23 DIAGNOSIS — D631 Anemia in chronic kidney disease: Secondary | ICD-10-CM | POA: Diagnosis not present

## 2017-08-24 DIAGNOSIS — E440 Moderate protein-calorie malnutrition: Secondary | ICD-10-CM | POA: Diagnosis not present

## 2017-08-24 DIAGNOSIS — D509 Iron deficiency anemia, unspecified: Secondary | ICD-10-CM | POA: Diagnosis not present

## 2017-08-24 DIAGNOSIS — K769 Liver disease, unspecified: Secondary | ICD-10-CM | POA: Diagnosis not present

## 2017-08-24 DIAGNOSIS — N186 End stage renal disease: Secondary | ICD-10-CM | POA: Diagnosis not present

## 2017-08-24 DIAGNOSIS — N2581 Secondary hyperparathyroidism of renal origin: Secondary | ICD-10-CM | POA: Diagnosis not present

## 2017-08-24 DIAGNOSIS — D631 Anemia in chronic kidney disease: Secondary | ICD-10-CM | POA: Diagnosis not present

## 2017-08-25 DIAGNOSIS — D631 Anemia in chronic kidney disease: Secondary | ICD-10-CM | POA: Diagnosis not present

## 2017-08-25 DIAGNOSIS — N186 End stage renal disease: Secondary | ICD-10-CM | POA: Diagnosis not present

## 2017-08-25 DIAGNOSIS — N2581 Secondary hyperparathyroidism of renal origin: Secondary | ICD-10-CM | POA: Diagnosis not present

## 2017-08-25 DIAGNOSIS — D509 Iron deficiency anemia, unspecified: Secondary | ICD-10-CM | POA: Diagnosis not present

## 2017-08-25 DIAGNOSIS — E440 Moderate protein-calorie malnutrition: Secondary | ICD-10-CM | POA: Diagnosis not present

## 2017-08-25 DIAGNOSIS — K769 Liver disease, unspecified: Secondary | ICD-10-CM | POA: Diagnosis not present

## 2017-08-26 DIAGNOSIS — N2581 Secondary hyperparathyroidism of renal origin: Secondary | ICD-10-CM | POA: Diagnosis not present

## 2017-08-26 DIAGNOSIS — E440 Moderate protein-calorie malnutrition: Secondary | ICD-10-CM | POA: Diagnosis not present

## 2017-08-26 DIAGNOSIS — D631 Anemia in chronic kidney disease: Secondary | ICD-10-CM | POA: Diagnosis not present

## 2017-08-26 DIAGNOSIS — N186 End stage renal disease: Secondary | ICD-10-CM | POA: Diagnosis not present

## 2017-08-26 DIAGNOSIS — K769 Liver disease, unspecified: Secondary | ICD-10-CM | POA: Diagnosis not present

## 2017-08-26 DIAGNOSIS — D509 Iron deficiency anemia, unspecified: Secondary | ICD-10-CM | POA: Diagnosis not present

## 2017-08-27 DIAGNOSIS — K769 Liver disease, unspecified: Secondary | ICD-10-CM | POA: Diagnosis not present

## 2017-08-27 DIAGNOSIS — D631 Anemia in chronic kidney disease: Secondary | ICD-10-CM | POA: Diagnosis not present

## 2017-08-27 DIAGNOSIS — D509 Iron deficiency anemia, unspecified: Secondary | ICD-10-CM | POA: Diagnosis not present

## 2017-08-27 DIAGNOSIS — N186 End stage renal disease: Secondary | ICD-10-CM | POA: Diagnosis not present

## 2017-08-27 DIAGNOSIS — N2581 Secondary hyperparathyroidism of renal origin: Secondary | ICD-10-CM | POA: Diagnosis not present

## 2017-08-27 DIAGNOSIS — E440 Moderate protein-calorie malnutrition: Secondary | ICD-10-CM | POA: Diagnosis not present

## 2017-08-28 DIAGNOSIS — E440 Moderate protein-calorie malnutrition: Secondary | ICD-10-CM | POA: Diagnosis not present

## 2017-08-28 DIAGNOSIS — N2581 Secondary hyperparathyroidism of renal origin: Secondary | ICD-10-CM | POA: Diagnosis not present

## 2017-08-28 DIAGNOSIS — R82994 Hypercalciuria: Secondary | ICD-10-CM | POA: Diagnosis not present

## 2017-08-28 DIAGNOSIS — D631 Anemia in chronic kidney disease: Secondary | ICD-10-CM | POA: Diagnosis not present

## 2017-08-28 DIAGNOSIS — K769 Liver disease, unspecified: Secondary | ICD-10-CM | POA: Diagnosis not present

## 2017-08-28 DIAGNOSIS — D509 Iron deficiency anemia, unspecified: Secondary | ICD-10-CM | POA: Diagnosis not present

## 2017-08-28 DIAGNOSIS — N186 End stage renal disease: Secondary | ICD-10-CM | POA: Diagnosis not present

## 2017-08-29 DIAGNOSIS — D631 Anemia in chronic kidney disease: Secondary | ICD-10-CM | POA: Diagnosis not present

## 2017-08-29 DIAGNOSIS — E440 Moderate protein-calorie malnutrition: Secondary | ICD-10-CM | POA: Diagnosis not present

## 2017-08-29 DIAGNOSIS — N2581 Secondary hyperparathyroidism of renal origin: Secondary | ICD-10-CM | POA: Diagnosis not present

## 2017-08-29 DIAGNOSIS — N186 End stage renal disease: Secondary | ICD-10-CM | POA: Diagnosis not present

## 2017-08-29 DIAGNOSIS — K769 Liver disease, unspecified: Secondary | ICD-10-CM | POA: Diagnosis not present

## 2017-08-29 DIAGNOSIS — D509 Iron deficiency anemia, unspecified: Secondary | ICD-10-CM | POA: Diagnosis not present

## 2017-08-30 ENCOUNTER — Ambulatory Visit: Payer: Self-pay | Admitting: Cardiology

## 2017-08-30 DIAGNOSIS — N186 End stage renal disease: Secondary | ICD-10-CM | POA: Diagnosis not present

## 2017-08-30 DIAGNOSIS — D509 Iron deficiency anemia, unspecified: Secondary | ICD-10-CM | POA: Diagnosis not present

## 2017-08-30 DIAGNOSIS — E440 Moderate protein-calorie malnutrition: Secondary | ICD-10-CM | POA: Diagnosis not present

## 2017-08-30 DIAGNOSIS — D631 Anemia in chronic kidney disease: Secondary | ICD-10-CM | POA: Diagnosis not present

## 2017-08-30 DIAGNOSIS — K769 Liver disease, unspecified: Secondary | ICD-10-CM | POA: Diagnosis not present

## 2017-08-30 DIAGNOSIS — N2581 Secondary hyperparathyroidism of renal origin: Secondary | ICD-10-CM | POA: Diagnosis not present

## 2017-08-31 DIAGNOSIS — D631 Anemia in chronic kidney disease: Secondary | ICD-10-CM | POA: Diagnosis not present

## 2017-08-31 DIAGNOSIS — D509 Iron deficiency anemia, unspecified: Secondary | ICD-10-CM | POA: Diagnosis not present

## 2017-08-31 DIAGNOSIS — N186 End stage renal disease: Secondary | ICD-10-CM | POA: Diagnosis not present

## 2017-08-31 DIAGNOSIS — E440 Moderate protein-calorie malnutrition: Secondary | ICD-10-CM | POA: Diagnosis not present

## 2017-08-31 DIAGNOSIS — K769 Liver disease, unspecified: Secondary | ICD-10-CM | POA: Diagnosis not present

## 2017-08-31 DIAGNOSIS — N2581 Secondary hyperparathyroidism of renal origin: Secondary | ICD-10-CM | POA: Diagnosis not present

## 2017-09-01 DIAGNOSIS — N2581 Secondary hyperparathyroidism of renal origin: Secondary | ICD-10-CM | POA: Diagnosis not present

## 2017-09-01 DIAGNOSIS — D509 Iron deficiency anemia, unspecified: Secondary | ICD-10-CM | POA: Diagnosis not present

## 2017-09-01 DIAGNOSIS — D631 Anemia in chronic kidney disease: Secondary | ICD-10-CM | POA: Diagnosis not present

## 2017-09-01 DIAGNOSIS — N186 End stage renal disease: Secondary | ICD-10-CM | POA: Diagnosis not present

## 2017-09-01 DIAGNOSIS — E440 Moderate protein-calorie malnutrition: Secondary | ICD-10-CM | POA: Diagnosis not present

## 2017-09-01 DIAGNOSIS — K769 Liver disease, unspecified: Secondary | ICD-10-CM | POA: Diagnosis not present

## 2017-09-02 DIAGNOSIS — E440 Moderate protein-calorie malnutrition: Secondary | ICD-10-CM | POA: Diagnosis not present

## 2017-09-02 DIAGNOSIS — N2581 Secondary hyperparathyroidism of renal origin: Secondary | ICD-10-CM | POA: Diagnosis not present

## 2017-09-02 DIAGNOSIS — N186 End stage renal disease: Secondary | ICD-10-CM | POA: Diagnosis not present

## 2017-09-02 DIAGNOSIS — D631 Anemia in chronic kidney disease: Secondary | ICD-10-CM | POA: Diagnosis not present

## 2017-09-02 DIAGNOSIS — K769 Liver disease, unspecified: Secondary | ICD-10-CM | POA: Diagnosis not present

## 2017-09-02 DIAGNOSIS — D509 Iron deficiency anemia, unspecified: Secondary | ICD-10-CM | POA: Diagnosis not present

## 2017-09-03 DIAGNOSIS — D509 Iron deficiency anemia, unspecified: Secondary | ICD-10-CM | POA: Diagnosis not present

## 2017-09-03 DIAGNOSIS — K769 Liver disease, unspecified: Secondary | ICD-10-CM | POA: Diagnosis not present

## 2017-09-03 DIAGNOSIS — N186 End stage renal disease: Secondary | ICD-10-CM | POA: Diagnosis not present

## 2017-09-03 DIAGNOSIS — D631 Anemia in chronic kidney disease: Secondary | ICD-10-CM | POA: Diagnosis not present

## 2017-09-03 DIAGNOSIS — E440 Moderate protein-calorie malnutrition: Secondary | ICD-10-CM | POA: Diagnosis not present

## 2017-09-03 DIAGNOSIS — N2581 Secondary hyperparathyroidism of renal origin: Secondary | ICD-10-CM | POA: Diagnosis not present

## 2017-09-04 DIAGNOSIS — D631 Anemia in chronic kidney disease: Secondary | ICD-10-CM | POA: Diagnosis not present

## 2017-09-04 DIAGNOSIS — N186 End stage renal disease: Secondary | ICD-10-CM | POA: Diagnosis not present

## 2017-09-04 DIAGNOSIS — D509 Iron deficiency anemia, unspecified: Secondary | ICD-10-CM | POA: Diagnosis not present

## 2017-09-04 DIAGNOSIS — K769 Liver disease, unspecified: Secondary | ICD-10-CM | POA: Diagnosis not present

## 2017-09-04 DIAGNOSIS — N2581 Secondary hyperparathyroidism of renal origin: Secondary | ICD-10-CM | POA: Diagnosis not present

## 2017-09-04 DIAGNOSIS — E440 Moderate protein-calorie malnutrition: Secondary | ICD-10-CM | POA: Diagnosis not present

## 2017-09-05 DIAGNOSIS — N186 End stage renal disease: Secondary | ICD-10-CM | POA: Diagnosis not present

## 2017-09-05 DIAGNOSIS — K769 Liver disease, unspecified: Secondary | ICD-10-CM | POA: Diagnosis not present

## 2017-09-05 DIAGNOSIS — D509 Iron deficiency anemia, unspecified: Secondary | ICD-10-CM | POA: Diagnosis not present

## 2017-09-05 DIAGNOSIS — E440 Moderate protein-calorie malnutrition: Secondary | ICD-10-CM | POA: Diagnosis not present

## 2017-09-05 DIAGNOSIS — N2581 Secondary hyperparathyroidism of renal origin: Secondary | ICD-10-CM | POA: Diagnosis not present

## 2017-09-05 DIAGNOSIS — D631 Anemia in chronic kidney disease: Secondary | ICD-10-CM | POA: Diagnosis not present

## 2017-09-06 DIAGNOSIS — K769 Liver disease, unspecified: Secondary | ICD-10-CM | POA: Diagnosis not present

## 2017-09-06 DIAGNOSIS — D631 Anemia in chronic kidney disease: Secondary | ICD-10-CM | POA: Diagnosis not present

## 2017-09-06 DIAGNOSIS — E440 Moderate protein-calorie malnutrition: Secondary | ICD-10-CM | POA: Diagnosis not present

## 2017-09-06 DIAGNOSIS — N2581 Secondary hyperparathyroidism of renal origin: Secondary | ICD-10-CM | POA: Diagnosis not present

## 2017-09-06 DIAGNOSIS — N186 End stage renal disease: Secondary | ICD-10-CM | POA: Diagnosis not present

## 2017-09-06 DIAGNOSIS — D509 Iron deficiency anemia, unspecified: Secondary | ICD-10-CM | POA: Diagnosis not present

## 2017-09-07 DIAGNOSIS — D509 Iron deficiency anemia, unspecified: Secondary | ICD-10-CM | POA: Diagnosis not present

## 2017-09-07 DIAGNOSIS — N2581 Secondary hyperparathyroidism of renal origin: Secondary | ICD-10-CM | POA: Diagnosis not present

## 2017-09-07 DIAGNOSIS — K769 Liver disease, unspecified: Secondary | ICD-10-CM | POA: Diagnosis not present

## 2017-09-07 DIAGNOSIS — N186 End stage renal disease: Secondary | ICD-10-CM | POA: Diagnosis not present

## 2017-09-07 DIAGNOSIS — E440 Moderate protein-calorie malnutrition: Secondary | ICD-10-CM | POA: Diagnosis not present

## 2017-09-07 DIAGNOSIS — D631 Anemia in chronic kidney disease: Secondary | ICD-10-CM | POA: Diagnosis not present

## 2017-09-08 DIAGNOSIS — N2581 Secondary hyperparathyroidism of renal origin: Secondary | ICD-10-CM | POA: Diagnosis not present

## 2017-09-08 DIAGNOSIS — E440 Moderate protein-calorie malnutrition: Secondary | ICD-10-CM | POA: Diagnosis not present

## 2017-09-08 DIAGNOSIS — D631 Anemia in chronic kidney disease: Secondary | ICD-10-CM | POA: Diagnosis not present

## 2017-09-08 DIAGNOSIS — N186 End stage renal disease: Secondary | ICD-10-CM | POA: Diagnosis not present

## 2017-09-08 DIAGNOSIS — D509 Iron deficiency anemia, unspecified: Secondary | ICD-10-CM | POA: Diagnosis not present

## 2017-09-08 DIAGNOSIS — K769 Liver disease, unspecified: Secondary | ICD-10-CM | POA: Diagnosis not present

## 2017-09-09 DIAGNOSIS — D509 Iron deficiency anemia, unspecified: Secondary | ICD-10-CM | POA: Diagnosis not present

## 2017-09-09 DIAGNOSIS — N186 End stage renal disease: Secondary | ICD-10-CM | POA: Diagnosis not present

## 2017-09-09 DIAGNOSIS — E440 Moderate protein-calorie malnutrition: Secondary | ICD-10-CM | POA: Diagnosis not present

## 2017-09-09 DIAGNOSIS — N2581 Secondary hyperparathyroidism of renal origin: Secondary | ICD-10-CM | POA: Diagnosis not present

## 2017-09-09 DIAGNOSIS — D631 Anemia in chronic kidney disease: Secondary | ICD-10-CM | POA: Diagnosis not present

## 2017-09-09 DIAGNOSIS — K769 Liver disease, unspecified: Secondary | ICD-10-CM | POA: Diagnosis not present

## 2017-09-10 DIAGNOSIS — D631 Anemia in chronic kidney disease: Secondary | ICD-10-CM | POA: Diagnosis not present

## 2017-09-10 DIAGNOSIS — E440 Moderate protein-calorie malnutrition: Secondary | ICD-10-CM | POA: Diagnosis not present

## 2017-09-10 DIAGNOSIS — N186 End stage renal disease: Secondary | ICD-10-CM | POA: Diagnosis not present

## 2017-09-10 DIAGNOSIS — D509 Iron deficiency anemia, unspecified: Secondary | ICD-10-CM | POA: Diagnosis not present

## 2017-09-10 DIAGNOSIS — K769 Liver disease, unspecified: Secondary | ICD-10-CM | POA: Diagnosis not present

## 2017-09-10 DIAGNOSIS — N2581 Secondary hyperparathyroidism of renal origin: Secondary | ICD-10-CM | POA: Diagnosis not present

## 2017-09-11 DIAGNOSIS — Z992 Dependence on renal dialysis: Secondary | ICD-10-CM | POA: Diagnosis not present

## 2017-09-11 DIAGNOSIS — D631 Anemia in chronic kidney disease: Secondary | ICD-10-CM | POA: Diagnosis not present

## 2017-09-11 DIAGNOSIS — E782 Mixed hyperlipidemia: Secondary | ICD-10-CM | POA: Diagnosis not present

## 2017-09-11 DIAGNOSIS — B182 Chronic viral hepatitis C: Secondary | ICD-10-CM | POA: Diagnosis not present

## 2017-09-11 DIAGNOSIS — E440 Moderate protein-calorie malnutrition: Secondary | ICD-10-CM | POA: Diagnosis not present

## 2017-09-11 DIAGNOSIS — N2581 Secondary hyperparathyroidism of renal origin: Secondary | ICD-10-CM | POA: Diagnosis not present

## 2017-09-11 DIAGNOSIS — K219 Gastro-esophageal reflux disease without esophagitis: Secondary | ICD-10-CM | POA: Diagnosis not present

## 2017-09-11 DIAGNOSIS — E1122 Type 2 diabetes mellitus with diabetic chronic kidney disease: Secondary | ICD-10-CM | POA: Diagnosis not present

## 2017-09-11 DIAGNOSIS — K769 Liver disease, unspecified: Secondary | ICD-10-CM | POA: Diagnosis not present

## 2017-09-11 DIAGNOSIS — I10 Essential (primary) hypertension: Secondary | ICD-10-CM | POA: Diagnosis not present

## 2017-09-11 DIAGNOSIS — N186 End stage renal disease: Secondary | ICD-10-CM | POA: Diagnosis not present

## 2017-09-11 DIAGNOSIS — F322 Major depressive disorder, single episode, severe without psychotic features: Secondary | ICD-10-CM | POA: Diagnosis not present

## 2017-09-11 DIAGNOSIS — D509 Iron deficiency anemia, unspecified: Secondary | ICD-10-CM | POA: Diagnosis not present

## 2017-09-11 DIAGNOSIS — R140 Abdominal distension (gaseous): Secondary | ICD-10-CM | POA: Diagnosis not present

## 2017-09-11 DIAGNOSIS — Z1389 Encounter for screening for other disorder: Secondary | ICD-10-CM | POA: Diagnosis not present

## 2017-09-12 ENCOUNTER — Ambulatory Visit (INDEPENDENT_AMBULATORY_CARE_PROVIDER_SITE_OTHER): Payer: Medicare Other | Admitting: Podiatry

## 2017-09-12 ENCOUNTER — Encounter: Payer: Self-pay | Admitting: Podiatry

## 2017-09-12 DIAGNOSIS — B351 Tinea unguium: Secondary | ICD-10-CM

## 2017-09-12 DIAGNOSIS — D631 Anemia in chronic kidney disease: Secondary | ICD-10-CM | POA: Diagnosis not present

## 2017-09-12 DIAGNOSIS — L603 Nail dystrophy: Secondary | ICD-10-CM | POA: Diagnosis not present

## 2017-09-12 DIAGNOSIS — N2581 Secondary hyperparathyroidism of renal origin: Secondary | ICD-10-CM | POA: Diagnosis not present

## 2017-09-12 DIAGNOSIS — N186 End stage renal disease: Secondary | ICD-10-CM | POA: Diagnosis not present

## 2017-09-12 DIAGNOSIS — D509 Iron deficiency anemia, unspecified: Secondary | ICD-10-CM | POA: Diagnosis not present

## 2017-09-12 DIAGNOSIS — K769 Liver disease, unspecified: Secondary | ICD-10-CM | POA: Diagnosis not present

## 2017-09-12 DIAGNOSIS — E440 Moderate protein-calorie malnutrition: Secondary | ICD-10-CM | POA: Diagnosis not present

## 2017-09-12 NOTE — Progress Notes (Signed)
   Subjective:    Patient ID: Maureen Barnes, female    DOB: 06-Jan-1961, 57 y.o.   MRN: [id number]  HPI  Chief Complaint  Patient presents with  . Nail Problem    Right, 4th and 5th toes - nails are dark/thick. Left, 5th toe - dark..    57 y.o. female presents with the above complaint.  Complains of thickening and darkened discoloration to the toenails.  Denies pain.  Diabetic with last A1c of 5.0.  Past Medical History:  Diagnosis Date  . Anemia   . Diabetes mellitus   . Hep C w/o coma, chronic ([place])   . Hypertension    Past Surgical History:  Procedure Laterality Date  . TUBAL LIGATION      Current Outpatient Medications:  .  amLODipine (NORVASC) 10 MG tablet, Take 10 mg by mouth daily.  , Disp: , Rfl:  .  aspirin EC 81 MG tablet, Take 81 mg by mouth daily., Disp: , Rfl:  .  carvedilol (COREG) 12.5 MG tablet, Take 1.5 tablets (18.75 mg total) by mouth 2 (two) times daily with a meal., Disp: 90 tablet, Rfl: 6 .  cyclobenzaprine (FLEXERIL) 10 MG tablet, Take 1 tablet by mouth as directed., Disp: , Rfl:  .  diphenhydrAMINE (BENADRYL) 25 MG tablet, Take 1 tablet (25 mg total) by mouth at bedtime as needed., Disp: 30 tablet, Rfl: 0 .  ferrous sulfate 325 (65 FE) MG EC tablet, Take 325 mg by mouth 2 (two) times daily., Disp: , Rfl:  .  Insulin Glargine (LANTUS SOLOSTAR [place]), Inject 26 Units into the skin daily., Disp: , Rfl:  .  loratadine (CLARITIN) 10 MG tablet, Take 10 mg by mouth as directed., Disp: , Rfl:  .  losartan-hydrochlorothiazide (HYZAAR) 100-25 MG tablet, Take 1 tablet by mouth daily., Disp: 90 tablet, Rfl: 3 .  pravastatin (PRAVACHOL) 40 MG tablet, Take 40 mg by mouth daily., Disp: , Rfl:  .  SUMAtriptan (IMITREX) 25 MG tablet, Take 25 mg by mouth every 2 (two) hours as needed for migraine. May repeat in 2 hours if headache persists or recurs., Disp: , Rfl:   Allergies  Allergen Reactions  . Iodine Itching and Swelling    Shellfish-lips swell itching.  .  Peanut-Containing Drug Products Itching and Swelling  . Shellfish Allergy Itching and Swelling   Review of Systems  All other systems reviewed and are negative.      Objective:   Physical Exam There were no vitals filed for this visit. General AA&O x3. Normal mood and affect.  Vascular Dorsalis pedis and posterior tibial pulses  present 2+ bilaterally  Capillary refill normal to all digits. Pedal hair growth normal.  Neurologic Epicritic sensation grossly present.  Dermatologic No open lesions. Interspaces clear of maceration. Thickening discoloration to the fifth toenails bilaterally, right fourth toenail  Orthopedic: MMT 5/5 in dorsiflexion, plantarflexion, inversion, and eversion. Normal joint ROM without pain or crepitus.     Assessment & Plan:   Onychomycosis -Educated on etiology of nail fungus. -Nail sample taken for microbiology and histology. -Recommend over-the-counter ketoconazole cream -We will follow-up further treatments in 5 weeks  Return in about 5 weeks (around 10/17/2017) for Nail Fungus.

## 2017-09-13 DIAGNOSIS — K769 Liver disease, unspecified: Secondary | ICD-10-CM | POA: Diagnosis not present

## 2017-09-13 DIAGNOSIS — E440 Moderate protein-calorie malnutrition: Secondary | ICD-10-CM | POA: Diagnosis not present

## 2017-09-13 DIAGNOSIS — N186 End stage renal disease: Secondary | ICD-10-CM | POA: Diagnosis not present

## 2017-09-13 DIAGNOSIS — D631 Anemia in chronic kidney disease: Secondary | ICD-10-CM | POA: Diagnosis not present

## 2017-09-13 DIAGNOSIS — N2581 Secondary hyperparathyroidism of renal origin: Secondary | ICD-10-CM | POA: Diagnosis not present

## 2017-09-13 DIAGNOSIS — D509 Iron deficiency anemia, unspecified: Secondary | ICD-10-CM | POA: Diagnosis not present

## 2017-09-14 DIAGNOSIS — K769 Liver disease, unspecified: Secondary | ICD-10-CM | POA: Diagnosis not present

## 2017-09-14 DIAGNOSIS — D509 Iron deficiency anemia, unspecified: Secondary | ICD-10-CM | POA: Diagnosis not present

## 2017-09-14 DIAGNOSIS — D631 Anemia in chronic kidney disease: Secondary | ICD-10-CM | POA: Diagnosis not present

## 2017-09-14 DIAGNOSIS — E440 Moderate protein-calorie malnutrition: Secondary | ICD-10-CM | POA: Diagnosis not present

## 2017-09-14 DIAGNOSIS — N186 End stage renal disease: Secondary | ICD-10-CM | POA: Diagnosis not present

## 2017-09-14 DIAGNOSIS — N2581 Secondary hyperparathyroidism of renal origin: Secondary | ICD-10-CM | POA: Diagnosis not present

## 2017-09-15 DIAGNOSIS — K769 Liver disease, unspecified: Secondary | ICD-10-CM | POA: Diagnosis not present

## 2017-09-15 DIAGNOSIS — N186 End stage renal disease: Secondary | ICD-10-CM | POA: Diagnosis not present

## 2017-09-15 DIAGNOSIS — E440 Moderate protein-calorie malnutrition: Secondary | ICD-10-CM | POA: Diagnosis not present

## 2017-09-15 DIAGNOSIS — N2581 Secondary hyperparathyroidism of renal origin: Secondary | ICD-10-CM | POA: Diagnosis not present

## 2017-09-15 DIAGNOSIS — D631 Anemia in chronic kidney disease: Secondary | ICD-10-CM | POA: Diagnosis not present

## 2017-09-15 DIAGNOSIS — D509 Iron deficiency anemia, unspecified: Secondary | ICD-10-CM | POA: Diagnosis not present

## 2017-09-16 DIAGNOSIS — D631 Anemia in chronic kidney disease: Secondary | ICD-10-CM | POA: Diagnosis not present

## 2017-09-16 DIAGNOSIS — K769 Liver disease, unspecified: Secondary | ICD-10-CM | POA: Diagnosis not present

## 2017-09-16 DIAGNOSIS — E440 Moderate protein-calorie malnutrition: Secondary | ICD-10-CM | POA: Diagnosis not present

## 2017-09-16 DIAGNOSIS — N186 End stage renal disease: Secondary | ICD-10-CM | POA: Diagnosis not present

## 2017-09-16 DIAGNOSIS — D509 Iron deficiency anemia, unspecified: Secondary | ICD-10-CM | POA: Diagnosis not present

## 2017-09-16 DIAGNOSIS — N2581 Secondary hyperparathyroidism of renal origin: Secondary | ICD-10-CM | POA: Diagnosis not present

## 2017-09-17 DIAGNOSIS — D631 Anemia in chronic kidney disease: Secondary | ICD-10-CM | POA: Diagnosis not present

## 2017-09-17 DIAGNOSIS — E440 Moderate protein-calorie malnutrition: Secondary | ICD-10-CM | POA: Diagnosis not present

## 2017-09-17 DIAGNOSIS — K769 Liver disease, unspecified: Secondary | ICD-10-CM | POA: Diagnosis not present

## 2017-09-17 DIAGNOSIS — N186 End stage renal disease: Secondary | ICD-10-CM | POA: Diagnosis not present

## 2017-09-17 DIAGNOSIS — D509 Iron deficiency anemia, unspecified: Secondary | ICD-10-CM | POA: Diagnosis not present

## 2017-09-17 DIAGNOSIS — N2581 Secondary hyperparathyroidism of renal origin: Secondary | ICD-10-CM | POA: Diagnosis not present

## 2017-09-18 DIAGNOSIS — K769 Liver disease, unspecified: Secondary | ICD-10-CM | POA: Diagnosis not present

## 2017-09-18 DIAGNOSIS — D631 Anemia in chronic kidney disease: Secondary | ICD-10-CM | POA: Diagnosis not present

## 2017-09-18 DIAGNOSIS — N186 End stage renal disease: Secondary | ICD-10-CM | POA: Diagnosis not present

## 2017-09-18 DIAGNOSIS — N2581 Secondary hyperparathyroidism of renal origin: Secondary | ICD-10-CM | POA: Diagnosis not present

## 2017-09-18 DIAGNOSIS — E440 Moderate protein-calorie malnutrition: Secondary | ICD-10-CM | POA: Diagnosis not present

## 2017-09-18 DIAGNOSIS — D509 Iron deficiency anemia, unspecified: Secondary | ICD-10-CM | POA: Diagnosis not present

## 2017-09-19 DIAGNOSIS — D509 Iron deficiency anemia, unspecified: Secondary | ICD-10-CM | POA: Diagnosis not present

## 2017-09-19 DIAGNOSIS — N186 End stage renal disease: Secondary | ICD-10-CM | POA: Diagnosis not present

## 2017-09-19 DIAGNOSIS — K769 Liver disease, unspecified: Secondary | ICD-10-CM | POA: Diagnosis not present

## 2017-09-19 DIAGNOSIS — D631 Anemia in chronic kidney disease: Secondary | ICD-10-CM | POA: Diagnosis not present

## 2017-09-19 DIAGNOSIS — N2581 Secondary hyperparathyroidism of renal origin: Secondary | ICD-10-CM | POA: Diagnosis not present

## 2017-09-19 DIAGNOSIS — E440 Moderate protein-calorie malnutrition: Secondary | ICD-10-CM | POA: Diagnosis not present

## 2017-09-20 DIAGNOSIS — N186 End stage renal disease: Secondary | ICD-10-CM | POA: Diagnosis not present

## 2017-09-20 DIAGNOSIS — N2581 Secondary hyperparathyroidism of renal origin: Secondary | ICD-10-CM | POA: Diagnosis not present

## 2017-09-20 DIAGNOSIS — D631 Anemia in chronic kidney disease: Secondary | ICD-10-CM | POA: Diagnosis not present

## 2017-09-20 DIAGNOSIS — I129 Hypertensive chronic kidney disease with stage 1 through stage 4 chronic kidney disease, or unspecified chronic kidney disease: Secondary | ICD-10-CM | POA: Diagnosis not present

## 2017-09-20 DIAGNOSIS — Z992 Dependence on renal dialysis: Secondary | ICD-10-CM | POA: Diagnosis not present

## 2017-09-21 DIAGNOSIS — N186 End stage renal disease: Secondary | ICD-10-CM | POA: Diagnosis not present

## 2017-09-21 DIAGNOSIS — D631 Anemia in chronic kidney disease: Secondary | ICD-10-CM | POA: Diagnosis not present

## 2017-09-21 DIAGNOSIS — N2581 Secondary hyperparathyroidism of renal origin: Secondary | ICD-10-CM | POA: Diagnosis not present

## 2017-09-22 DIAGNOSIS — N2581 Secondary hyperparathyroidism of renal origin: Secondary | ICD-10-CM | POA: Diagnosis not present

## 2017-09-22 DIAGNOSIS — D631 Anemia in chronic kidney disease: Secondary | ICD-10-CM | POA: Diagnosis not present

## 2017-09-22 DIAGNOSIS — N186 End stage renal disease: Secondary | ICD-10-CM | POA: Diagnosis not present

## 2017-09-23 DIAGNOSIS — N186 End stage renal disease: Secondary | ICD-10-CM | POA: Diagnosis not present

## 2017-09-23 DIAGNOSIS — R82994 Hypercalciuria: Secondary | ICD-10-CM | POA: Diagnosis not present

## 2017-09-23 DIAGNOSIS — D631 Anemia in chronic kidney disease: Secondary | ICD-10-CM | POA: Diagnosis not present

## 2017-09-23 DIAGNOSIS — N2581 Secondary hyperparathyroidism of renal origin: Secondary | ICD-10-CM | POA: Diagnosis not present

## 2017-09-24 DIAGNOSIS — D631 Anemia in chronic kidney disease: Secondary | ICD-10-CM | POA: Diagnosis not present

## 2017-09-24 DIAGNOSIS — N186 End stage renal disease: Secondary | ICD-10-CM | POA: Diagnosis not present

## 2017-09-24 DIAGNOSIS — N2581 Secondary hyperparathyroidism of renal origin: Secondary | ICD-10-CM | POA: Diagnosis not present

## 2017-09-25 DIAGNOSIS — D631 Anemia in chronic kidney disease: Secondary | ICD-10-CM | POA: Diagnosis not present

## 2017-09-25 DIAGNOSIS — N186 End stage renal disease: Secondary | ICD-10-CM | POA: Diagnosis not present

## 2017-09-25 DIAGNOSIS — N2581 Secondary hyperparathyroidism of renal origin: Secondary | ICD-10-CM | POA: Diagnosis not present

## 2017-09-26 DIAGNOSIS — D631 Anemia in chronic kidney disease: Secondary | ICD-10-CM | POA: Diagnosis not present

## 2017-09-26 DIAGNOSIS — N186 End stage renal disease: Secondary | ICD-10-CM | POA: Diagnosis not present

## 2017-09-26 DIAGNOSIS — N2581 Secondary hyperparathyroidism of renal origin: Secondary | ICD-10-CM | POA: Diagnosis not present

## 2017-09-27 DIAGNOSIS — D631 Anemia in chronic kidney disease: Secondary | ICD-10-CM | POA: Diagnosis not present

## 2017-09-27 DIAGNOSIS — N186 End stage renal disease: Secondary | ICD-10-CM | POA: Diagnosis not present

## 2017-09-27 DIAGNOSIS — N2581 Secondary hyperparathyroidism of renal origin: Secondary | ICD-10-CM | POA: Diagnosis not present

## 2017-09-28 DIAGNOSIS — N2581 Secondary hyperparathyroidism of renal origin: Secondary | ICD-10-CM | POA: Diagnosis not present

## 2017-09-28 DIAGNOSIS — D631 Anemia in chronic kidney disease: Secondary | ICD-10-CM | POA: Diagnosis not present

## 2017-09-28 DIAGNOSIS — N186 End stage renal disease: Secondary | ICD-10-CM | POA: Diagnosis not present

## 2017-09-29 DIAGNOSIS — N186 End stage renal disease: Secondary | ICD-10-CM | POA: Diagnosis not present

## 2017-09-29 DIAGNOSIS — D631 Anemia in chronic kidney disease: Secondary | ICD-10-CM | POA: Diagnosis not present

## 2017-09-29 DIAGNOSIS — N2581 Secondary hyperparathyroidism of renal origin: Secondary | ICD-10-CM | POA: Diagnosis not present

## 2017-09-30 DIAGNOSIS — N2581 Secondary hyperparathyroidism of renal origin: Secondary | ICD-10-CM | POA: Diagnosis not present

## 2017-09-30 DIAGNOSIS — N186 End stage renal disease: Secondary | ICD-10-CM | POA: Diagnosis not present

## 2017-09-30 DIAGNOSIS — D631 Anemia in chronic kidney disease: Secondary | ICD-10-CM | POA: Diagnosis not present

## 2017-10-01 DIAGNOSIS — N186 End stage renal disease: Secondary | ICD-10-CM | POA: Diagnosis not present

## 2017-10-01 DIAGNOSIS — N2581 Secondary hyperparathyroidism of renal origin: Secondary | ICD-10-CM | POA: Diagnosis not present

## 2017-10-01 DIAGNOSIS — D631 Anemia in chronic kidney disease: Secondary | ICD-10-CM | POA: Diagnosis not present

## 2017-10-02 DIAGNOSIS — D631 Anemia in chronic kidney disease: Secondary | ICD-10-CM | POA: Diagnosis not present

## 2017-10-02 DIAGNOSIS — N2581 Secondary hyperparathyroidism of renal origin: Secondary | ICD-10-CM | POA: Diagnosis not present

## 2017-10-02 DIAGNOSIS — N186 End stage renal disease: Secondary | ICD-10-CM | POA: Diagnosis not present

## 2017-10-03 DIAGNOSIS — N186 End stage renal disease: Secondary | ICD-10-CM | POA: Diagnosis not present

## 2017-10-03 DIAGNOSIS — N2581 Secondary hyperparathyroidism of renal origin: Secondary | ICD-10-CM | POA: Diagnosis not present

## 2017-10-03 DIAGNOSIS — D631 Anemia in chronic kidney disease: Secondary | ICD-10-CM | POA: Diagnosis not present

## 2017-10-04 DIAGNOSIS — N186 End stage renal disease: Secondary | ICD-10-CM | POA: Diagnosis not present

## 2017-10-04 DIAGNOSIS — N2581 Secondary hyperparathyroidism of renal origin: Secondary | ICD-10-CM | POA: Diagnosis not present

## 2017-10-04 DIAGNOSIS — D631 Anemia in chronic kidney disease: Secondary | ICD-10-CM | POA: Diagnosis not present

## 2017-10-05 DIAGNOSIS — N2581 Secondary hyperparathyroidism of renal origin: Secondary | ICD-10-CM | POA: Diagnosis not present

## 2017-10-05 DIAGNOSIS — D631 Anemia in chronic kidney disease: Secondary | ICD-10-CM | POA: Diagnosis not present

## 2017-10-05 DIAGNOSIS — N186 End stage renal disease: Secondary | ICD-10-CM | POA: Diagnosis not present

## 2017-10-06 DIAGNOSIS — N2581 Secondary hyperparathyroidism of renal origin: Secondary | ICD-10-CM | POA: Diagnosis not present

## 2017-10-06 DIAGNOSIS — D631 Anemia in chronic kidney disease: Secondary | ICD-10-CM | POA: Diagnosis not present

## 2017-10-06 DIAGNOSIS — N186 End stage renal disease: Secondary | ICD-10-CM | POA: Diagnosis not present

## 2017-10-07 DIAGNOSIS — N2581 Secondary hyperparathyroidism of renal origin: Secondary | ICD-10-CM | POA: Diagnosis not present

## 2017-10-07 DIAGNOSIS — N186 End stage renal disease: Secondary | ICD-10-CM | POA: Diagnosis not present

## 2017-10-07 DIAGNOSIS — D631 Anemia in chronic kidney disease: Secondary | ICD-10-CM | POA: Diagnosis not present

## 2017-10-08 DIAGNOSIS — N186 End stage renal disease: Secondary | ICD-10-CM | POA: Diagnosis not present

## 2017-10-08 DIAGNOSIS — D631 Anemia in chronic kidney disease: Secondary | ICD-10-CM | POA: Diagnosis not present

## 2017-10-08 DIAGNOSIS — N2581 Secondary hyperparathyroidism of renal origin: Secondary | ICD-10-CM | POA: Diagnosis not present

## 2017-10-09 DIAGNOSIS — N2581 Secondary hyperparathyroidism of renal origin: Secondary | ICD-10-CM | POA: Diagnosis not present

## 2017-10-09 DIAGNOSIS — D631 Anemia in chronic kidney disease: Secondary | ICD-10-CM | POA: Diagnosis not present

## 2017-10-09 DIAGNOSIS — N186 End stage renal disease: Secondary | ICD-10-CM | POA: Diagnosis not present

## 2017-10-10 DIAGNOSIS — N2581 Secondary hyperparathyroidism of renal origin: Secondary | ICD-10-CM | POA: Diagnosis not present

## 2017-10-10 DIAGNOSIS — N186 End stage renal disease: Secondary | ICD-10-CM | POA: Diagnosis not present

## 2017-10-10 DIAGNOSIS — D631 Anemia in chronic kidney disease: Secondary | ICD-10-CM | POA: Diagnosis not present

## 2017-10-11 DIAGNOSIS — N186 End stage renal disease: Secondary | ICD-10-CM | POA: Diagnosis not present

## 2017-10-11 DIAGNOSIS — N2581 Secondary hyperparathyroidism of renal origin: Secondary | ICD-10-CM | POA: Diagnosis not present

## 2017-10-11 DIAGNOSIS — D631 Anemia in chronic kidney disease: Secondary | ICD-10-CM | POA: Diagnosis not present

## 2017-10-12 DIAGNOSIS — D631 Anemia in chronic kidney disease: Secondary | ICD-10-CM | POA: Diagnosis not present

## 2017-10-12 DIAGNOSIS — N186 End stage renal disease: Secondary | ICD-10-CM | POA: Diagnosis not present

## 2017-10-12 DIAGNOSIS — N2581 Secondary hyperparathyroidism of renal origin: Secondary | ICD-10-CM | POA: Diagnosis not present

## 2017-10-13 DIAGNOSIS — N2581 Secondary hyperparathyroidism of renal origin: Secondary | ICD-10-CM | POA: Diagnosis not present

## 2017-10-13 DIAGNOSIS — N186 End stage renal disease: Secondary | ICD-10-CM | POA: Diagnosis not present

## 2017-10-13 DIAGNOSIS — D631 Anemia in chronic kidney disease: Secondary | ICD-10-CM | POA: Diagnosis not present

## 2017-10-14 DIAGNOSIS — D631 Anemia in chronic kidney disease: Secondary | ICD-10-CM | POA: Diagnosis not present

## 2017-10-14 DIAGNOSIS — N186 End stage renal disease: Secondary | ICD-10-CM | POA: Diagnosis not present

## 2017-10-14 DIAGNOSIS — N2581 Secondary hyperparathyroidism of renal origin: Secondary | ICD-10-CM | POA: Diagnosis not present

## 2017-10-15 DIAGNOSIS — N186 End stage renal disease: Secondary | ICD-10-CM | POA: Diagnosis not present

## 2017-10-15 DIAGNOSIS — N2581 Secondary hyperparathyroidism of renal origin: Secondary | ICD-10-CM | POA: Diagnosis not present

## 2017-10-15 DIAGNOSIS — D631 Anemia in chronic kidney disease: Secondary | ICD-10-CM | POA: Diagnosis not present

## 2017-10-16 DIAGNOSIS — N2581 Secondary hyperparathyroidism of renal origin: Secondary | ICD-10-CM | POA: Diagnosis not present

## 2017-10-16 DIAGNOSIS — N186 End stage renal disease: Secondary | ICD-10-CM | POA: Diagnosis not present

## 2017-10-16 DIAGNOSIS — D631 Anemia in chronic kidney disease: Secondary | ICD-10-CM | POA: Diagnosis not present

## 2017-10-17 ENCOUNTER — Ambulatory Visit (INDEPENDENT_AMBULATORY_CARE_PROVIDER_SITE_OTHER): Payer: Medicare Other | Admitting: Podiatry

## 2017-10-17 DIAGNOSIS — B351 Tinea unguium: Secondary | ICD-10-CM

## 2017-10-17 DIAGNOSIS — D631 Anemia in chronic kidney disease: Secondary | ICD-10-CM | POA: Diagnosis not present

## 2017-10-17 DIAGNOSIS — N186 End stage renal disease: Secondary | ICD-10-CM | POA: Diagnosis not present

## 2017-10-17 DIAGNOSIS — N2581 Secondary hyperparathyroidism of renal origin: Secondary | ICD-10-CM | POA: Diagnosis not present

## 2017-10-17 MED ORDER — CICLOPIROX 8 % EX SOLN: Freq: Every day | CUTANEOUS | 0 refills | Status: DC

## 2017-10-17 NOTE — Progress Notes (Signed)
  Subjective:  Patient ID: Maureen Barnes, female    DOB: [birth year],  MRN: [id number]  Chief Complaint  Patient presents with  . Nail Problem    nail fungus follow up   57 y.o. female returns for the above complaint.  States that the nail fungal not getting any better.  Has been using over-the-counter antifungal cream  Objective:  There were no vitals filed for this visit. General AA&O x3. Normal mood and affect.  Vascular Pedal pulses palpable.  Neurologic Epicritic sensation grossly intact.  Dermatologic Thickening and black discoloration discoloration toes 3 4 right foot  Orthopedic: No pain to palpation either foot.   Assessment & Plan:  Patient was evaluated and treated and all questions answered.  Tinea pedis -Nail histology and PCR reviewed.  Consistent with onychomycosis. -Rx Penlac.  Educated on application  Return in about 6 months (around 04/19/2018) for Nail Fungus.

## 2017-10-18 DIAGNOSIS — N186 End stage renal disease: Secondary | ICD-10-CM | POA: Diagnosis not present

## 2017-10-18 DIAGNOSIS — D631 Anemia in chronic kidney disease: Secondary | ICD-10-CM | POA: Diagnosis not present

## 2017-10-18 DIAGNOSIS — N2581 Secondary hyperparathyroidism of renal origin: Secondary | ICD-10-CM | POA: Diagnosis not present

## 2017-10-19 DIAGNOSIS — D631 Anemia in chronic kidney disease: Secondary | ICD-10-CM | POA: Diagnosis not present

## 2017-10-19 DIAGNOSIS — N2581 Secondary hyperparathyroidism of renal origin: Secondary | ICD-10-CM | POA: Diagnosis not present

## 2017-10-19 DIAGNOSIS — N186 End stage renal disease: Secondary | ICD-10-CM | POA: Diagnosis not present

## 2017-10-20 DIAGNOSIS — D631 Anemia in chronic kidney disease: Secondary | ICD-10-CM | POA: Diagnosis not present

## 2017-10-20 DIAGNOSIS — N2581 Secondary hyperparathyroidism of renal origin: Secondary | ICD-10-CM | POA: Diagnosis not present

## 2017-10-20 DIAGNOSIS — N186 End stage renal disease: Secondary | ICD-10-CM | POA: Diagnosis not present

## 2017-10-21 DIAGNOSIS — Z992 Dependence on renal dialysis: Secondary | ICD-10-CM | POA: Diagnosis not present

## 2017-10-21 DIAGNOSIS — B182 Chronic viral hepatitis C: Secondary | ICD-10-CM | POA: Diagnosis not present

## 2017-10-21 DIAGNOSIS — N2581 Secondary hyperparathyroidism of renal origin: Secondary | ICD-10-CM | POA: Diagnosis not present

## 2017-10-21 DIAGNOSIS — I129 Hypertensive chronic kidney disease with stage 1 through stage 4 chronic kidney disease, or unspecified chronic kidney disease: Secondary | ICD-10-CM | POA: Diagnosis not present

## 2017-10-21 DIAGNOSIS — N186 End stage renal disease: Secondary | ICD-10-CM | POA: Diagnosis not present

## 2017-10-21 DIAGNOSIS — N184 Chronic kidney disease, stage 4 (severe): Secondary | ICD-10-CM | POA: Diagnosis not present

## 2017-10-21 DIAGNOSIS — Z4932 Encounter for adequacy testing for peritoneal dialysis: Secondary | ICD-10-CM | POA: Diagnosis not present

## 2017-10-21 DIAGNOSIS — N951 Menopausal and female climacteric states: Secondary | ICD-10-CM | POA: Diagnosis not present

## 2017-10-21 DIAGNOSIS — Z01411 Encounter for gynecological examination (general) (routine) with abnormal findings: Secondary | ICD-10-CM | POA: Diagnosis not present

## 2017-10-21 DIAGNOSIS — D509 Iron deficiency anemia, unspecified: Secondary | ICD-10-CM | POA: Diagnosis not present

## 2017-10-21 DIAGNOSIS — E1122 Type 2 diabetes mellitus with diabetic chronic kidney disease: Secondary | ICD-10-CM | POA: Diagnosis not present

## 2017-10-21 DIAGNOSIS — Z794 Long term (current) use of insulin: Secondary | ICD-10-CM | POA: Diagnosis not present

## 2017-10-21 DIAGNOSIS — N2589 Other disorders resulting from impaired renal tubular function: Secondary | ICD-10-CM | POA: Diagnosis not present

## 2017-10-21 DIAGNOSIS — D631 Anemia in chronic kidney disease: Secondary | ICD-10-CM | POA: Diagnosis not present

## 2017-10-22 DIAGNOSIS — Z4932 Encounter for adequacy testing for peritoneal dialysis: Secondary | ICD-10-CM | POA: Diagnosis not present

## 2017-10-22 DIAGNOSIS — N2581 Secondary hyperparathyroidism of renal origin: Secondary | ICD-10-CM | POA: Diagnosis not present

## 2017-10-22 DIAGNOSIS — D631 Anemia in chronic kidney disease: Secondary | ICD-10-CM | POA: Diagnosis not present

## 2017-10-22 DIAGNOSIS — N186 End stage renal disease: Secondary | ICD-10-CM | POA: Diagnosis not present

## 2017-10-22 DIAGNOSIS — N2589 Other disorders resulting from impaired renal tubular function: Secondary | ICD-10-CM | POA: Diagnosis not present

## 2017-10-22 DIAGNOSIS — D509 Iron deficiency anemia, unspecified: Secondary | ICD-10-CM | POA: Diagnosis not present

## 2017-10-23 DIAGNOSIS — D509 Iron deficiency anemia, unspecified: Secondary | ICD-10-CM | POA: Diagnosis not present

## 2017-10-23 DIAGNOSIS — N2589 Other disorders resulting from impaired renal tubular function: Secondary | ICD-10-CM | POA: Diagnosis not present

## 2017-10-23 DIAGNOSIS — D631 Anemia in chronic kidney disease: Secondary | ICD-10-CM | POA: Diagnosis not present

## 2017-10-23 DIAGNOSIS — N2581 Secondary hyperparathyroidism of renal origin: Secondary | ICD-10-CM | POA: Diagnosis not present

## 2017-10-23 DIAGNOSIS — Z4932 Encounter for adequacy testing for peritoneal dialysis: Secondary | ICD-10-CM | POA: Diagnosis not present

## 2017-10-23 DIAGNOSIS — N186 End stage renal disease: Secondary | ICD-10-CM | POA: Diagnosis not present

## 2017-10-24 DIAGNOSIS — N186 End stage renal disease: Secondary | ICD-10-CM | POA: Diagnosis not present

## 2017-10-24 DIAGNOSIS — N2581 Secondary hyperparathyroidism of renal origin: Secondary | ICD-10-CM | POA: Diagnosis not present

## 2017-10-24 DIAGNOSIS — N2589 Other disorders resulting from impaired renal tubular function: Secondary | ICD-10-CM | POA: Diagnosis not present

## 2017-10-24 DIAGNOSIS — Z4932 Encounter for adequacy testing for peritoneal dialysis: Secondary | ICD-10-CM | POA: Diagnosis not present

## 2017-10-24 DIAGNOSIS — D509 Iron deficiency anemia, unspecified: Secondary | ICD-10-CM | POA: Diagnosis not present

## 2017-10-24 DIAGNOSIS — D631 Anemia in chronic kidney disease: Secondary | ICD-10-CM | POA: Diagnosis not present

## 2017-10-25 DIAGNOSIS — N2581 Secondary hyperparathyroidism of renal origin: Secondary | ICD-10-CM | POA: Diagnosis not present

## 2017-10-25 DIAGNOSIS — D631 Anemia in chronic kidney disease: Secondary | ICD-10-CM | POA: Diagnosis not present

## 2017-10-25 DIAGNOSIS — E7849 Other hyperlipidemia: Secondary | ICD-10-CM | POA: Diagnosis not present

## 2017-10-25 DIAGNOSIS — E1129 Type 2 diabetes mellitus with other diabetic kidney complication: Secondary | ICD-10-CM | POA: Diagnosis not present

## 2017-10-25 DIAGNOSIS — R82994 Hypercalciuria: Secondary | ICD-10-CM | POA: Diagnosis not present

## 2017-10-25 DIAGNOSIS — D509 Iron deficiency anemia, unspecified: Secondary | ICD-10-CM | POA: Diagnosis not present

## 2017-10-25 DIAGNOSIS — Z4932 Encounter for adequacy testing for peritoneal dialysis: Secondary | ICD-10-CM | POA: Diagnosis not present

## 2017-10-25 DIAGNOSIS — N186 End stage renal disease: Secondary | ICD-10-CM | POA: Diagnosis not present

## 2017-10-25 DIAGNOSIS — N2589 Other disorders resulting from impaired renal tubular function: Secondary | ICD-10-CM | POA: Diagnosis not present

## 2017-10-26 DIAGNOSIS — N2589 Other disorders resulting from impaired renal tubular function: Secondary | ICD-10-CM | POA: Diagnosis not present

## 2017-10-26 DIAGNOSIS — D509 Iron deficiency anemia, unspecified: Secondary | ICD-10-CM | POA: Diagnosis not present

## 2017-10-26 DIAGNOSIS — D631 Anemia in chronic kidney disease: Secondary | ICD-10-CM | POA: Diagnosis not present

## 2017-10-26 DIAGNOSIS — N186 End stage renal disease: Secondary | ICD-10-CM | POA: Diagnosis not present

## 2017-10-26 DIAGNOSIS — Z4932 Encounter for adequacy testing for peritoneal dialysis: Secondary | ICD-10-CM | POA: Diagnosis not present

## 2017-10-26 DIAGNOSIS — N2581 Secondary hyperparathyroidism of renal origin: Secondary | ICD-10-CM | POA: Diagnosis not present

## 2017-10-27 DIAGNOSIS — N186 End stage renal disease: Secondary | ICD-10-CM | POA: Diagnosis not present

## 2017-10-27 DIAGNOSIS — D631 Anemia in chronic kidney disease: Secondary | ICD-10-CM | POA: Diagnosis not present

## 2017-10-27 DIAGNOSIS — D509 Iron deficiency anemia, unspecified: Secondary | ICD-10-CM | POA: Diagnosis not present

## 2017-10-27 DIAGNOSIS — Z4932 Encounter for adequacy testing for peritoneal dialysis: Secondary | ICD-10-CM | POA: Diagnosis not present

## 2017-10-27 DIAGNOSIS — N2581 Secondary hyperparathyroidism of renal origin: Secondary | ICD-10-CM | POA: Diagnosis not present

## 2017-10-27 DIAGNOSIS — N2589 Other disorders resulting from impaired renal tubular function: Secondary | ICD-10-CM | POA: Diagnosis not present

## 2017-10-28 DIAGNOSIS — N186 End stage renal disease: Secondary | ICD-10-CM | POA: Diagnosis not present

## 2017-10-28 DIAGNOSIS — D631 Anemia in chronic kidney disease: Secondary | ICD-10-CM | POA: Diagnosis not present

## 2017-10-28 DIAGNOSIS — Z4932 Encounter for adequacy testing for peritoneal dialysis: Secondary | ICD-10-CM | POA: Diagnosis not present

## 2017-10-28 DIAGNOSIS — N2581 Secondary hyperparathyroidism of renal origin: Secondary | ICD-10-CM | POA: Diagnosis not present

## 2017-10-28 DIAGNOSIS — D509 Iron deficiency anemia, unspecified: Secondary | ICD-10-CM | POA: Diagnosis not present

## 2017-10-28 DIAGNOSIS — N2589 Other disorders resulting from impaired renal tubular function: Secondary | ICD-10-CM | POA: Diagnosis not present

## 2017-10-29 DIAGNOSIS — Z4932 Encounter for adequacy testing for peritoneal dialysis: Secondary | ICD-10-CM | POA: Diagnosis not present

## 2017-10-29 DIAGNOSIS — N2589 Other disorders resulting from impaired renal tubular function: Secondary | ICD-10-CM | POA: Diagnosis not present

## 2017-10-29 DIAGNOSIS — N186 End stage renal disease: Secondary | ICD-10-CM | POA: Diagnosis not present

## 2017-10-29 DIAGNOSIS — N2581 Secondary hyperparathyroidism of renal origin: Secondary | ICD-10-CM | POA: Diagnosis not present

## 2017-10-29 DIAGNOSIS — D509 Iron deficiency anemia, unspecified: Secondary | ICD-10-CM | POA: Diagnosis not present

## 2017-10-29 DIAGNOSIS — D631 Anemia in chronic kidney disease: Secondary | ICD-10-CM | POA: Diagnosis not present

## 2017-10-30 DIAGNOSIS — N2581 Secondary hyperparathyroidism of renal origin: Secondary | ICD-10-CM | POA: Diagnosis not present

## 2017-10-30 DIAGNOSIS — Z4932 Encounter for adequacy testing for peritoneal dialysis: Secondary | ICD-10-CM | POA: Diagnosis not present

## 2017-10-30 DIAGNOSIS — D509 Iron deficiency anemia, unspecified: Secondary | ICD-10-CM | POA: Diagnosis not present

## 2017-10-30 DIAGNOSIS — N186 End stage renal disease: Secondary | ICD-10-CM | POA: Diagnosis not present

## 2017-10-30 DIAGNOSIS — N2589 Other disorders resulting from impaired renal tubular function: Secondary | ICD-10-CM | POA: Diagnosis not present

## 2017-10-30 DIAGNOSIS — D631 Anemia in chronic kidney disease: Secondary | ICD-10-CM | POA: Diagnosis not present

## 2017-10-31 DIAGNOSIS — N186 End stage renal disease: Secondary | ICD-10-CM | POA: Diagnosis not present

## 2017-10-31 DIAGNOSIS — N2581 Secondary hyperparathyroidism of renal origin: Secondary | ICD-10-CM | POA: Diagnosis not present

## 2017-10-31 DIAGNOSIS — N2589 Other disorders resulting from impaired renal tubular function: Secondary | ICD-10-CM | POA: Diagnosis not present

## 2017-10-31 DIAGNOSIS — Z4932 Encounter for adequacy testing for peritoneal dialysis: Secondary | ICD-10-CM | POA: Diagnosis not present

## 2017-10-31 DIAGNOSIS — D509 Iron deficiency anemia, unspecified: Secondary | ICD-10-CM | POA: Diagnosis not present

## 2017-10-31 DIAGNOSIS — D631 Anemia in chronic kidney disease: Secondary | ICD-10-CM | POA: Diagnosis not present

## 2017-11-01 DIAGNOSIS — D631 Anemia in chronic kidney disease: Secondary | ICD-10-CM | POA: Diagnosis not present

## 2017-11-01 DIAGNOSIS — D509 Iron deficiency anemia, unspecified: Secondary | ICD-10-CM | POA: Diagnosis not present

## 2017-11-01 DIAGNOSIS — N2589 Other disorders resulting from impaired renal tubular function: Secondary | ICD-10-CM | POA: Diagnosis not present

## 2017-11-01 DIAGNOSIS — Z4932 Encounter for adequacy testing for peritoneal dialysis: Secondary | ICD-10-CM | POA: Diagnosis not present

## 2017-11-01 DIAGNOSIS — N186 End stage renal disease: Secondary | ICD-10-CM | POA: Diagnosis not present

## 2017-11-01 DIAGNOSIS — N2581 Secondary hyperparathyroidism of renal origin: Secondary | ICD-10-CM | POA: Diagnosis not present

## 2017-11-02 DIAGNOSIS — N2589 Other disorders resulting from impaired renal tubular function: Secondary | ICD-10-CM | POA: Diagnosis not present

## 2017-11-02 DIAGNOSIS — D631 Anemia in chronic kidney disease: Secondary | ICD-10-CM | POA: Diagnosis not present

## 2017-11-02 DIAGNOSIS — Z4932 Encounter for adequacy testing for peritoneal dialysis: Secondary | ICD-10-CM | POA: Diagnosis not present

## 2017-11-02 DIAGNOSIS — N186 End stage renal disease: Secondary | ICD-10-CM | POA: Diagnosis not present

## 2017-11-02 DIAGNOSIS — N2581 Secondary hyperparathyroidism of renal origin: Secondary | ICD-10-CM | POA: Diagnosis not present

## 2017-11-02 DIAGNOSIS — D509 Iron deficiency anemia, unspecified: Secondary | ICD-10-CM | POA: Diagnosis not present

## 2017-11-03 DIAGNOSIS — N186 End stage renal disease: Secondary | ICD-10-CM | POA: Diagnosis not present

## 2017-11-03 DIAGNOSIS — N2581 Secondary hyperparathyroidism of renal origin: Secondary | ICD-10-CM | POA: Diagnosis not present

## 2017-11-03 DIAGNOSIS — Z4932 Encounter for adequacy testing for peritoneal dialysis: Secondary | ICD-10-CM | POA: Diagnosis not present

## 2017-11-03 DIAGNOSIS — D509 Iron deficiency anemia, unspecified: Secondary | ICD-10-CM | POA: Diagnosis not present

## 2017-11-03 DIAGNOSIS — D631 Anemia in chronic kidney disease: Secondary | ICD-10-CM | POA: Diagnosis not present

## 2017-11-03 DIAGNOSIS — N2589 Other disorders resulting from impaired renal tubular function: Secondary | ICD-10-CM | POA: Diagnosis not present

## 2017-11-04 DIAGNOSIS — N186 End stage renal disease: Secondary | ICD-10-CM | POA: Diagnosis not present

## 2017-11-04 DIAGNOSIS — D631 Anemia in chronic kidney disease: Secondary | ICD-10-CM | POA: Diagnosis not present

## 2017-11-04 DIAGNOSIS — N2589 Other disorders resulting from impaired renal tubular function: Secondary | ICD-10-CM | POA: Diagnosis not present

## 2017-11-04 DIAGNOSIS — N2581 Secondary hyperparathyroidism of renal origin: Secondary | ICD-10-CM | POA: Diagnosis not present

## 2017-11-04 DIAGNOSIS — D509 Iron deficiency anemia, unspecified: Secondary | ICD-10-CM | POA: Diagnosis not present

## 2017-11-04 DIAGNOSIS — Z4932 Encounter for adequacy testing for peritoneal dialysis: Secondary | ICD-10-CM | POA: Diagnosis not present

## 2017-11-05 DIAGNOSIS — Z4932 Encounter for adequacy testing for peritoneal dialysis: Secondary | ICD-10-CM | POA: Diagnosis not present

## 2017-11-05 DIAGNOSIS — D631 Anemia in chronic kidney disease: Secondary | ICD-10-CM | POA: Diagnosis not present

## 2017-11-05 DIAGNOSIS — D509 Iron deficiency anemia, unspecified: Secondary | ICD-10-CM | POA: Diagnosis not present

## 2017-11-05 DIAGNOSIS — N186 End stage renal disease: Secondary | ICD-10-CM | POA: Diagnosis not present

## 2017-11-05 DIAGNOSIS — N2589 Other disorders resulting from impaired renal tubular function: Secondary | ICD-10-CM | POA: Diagnosis not present

## 2017-11-05 DIAGNOSIS — N2581 Secondary hyperparathyroidism of renal origin: Secondary | ICD-10-CM | POA: Diagnosis not present

## 2017-11-06 DIAGNOSIS — N186 End stage renal disease: Secondary | ICD-10-CM | POA: Diagnosis not present

## 2017-11-06 DIAGNOSIS — N2589 Other disorders resulting from impaired renal tubular function: Secondary | ICD-10-CM | POA: Diagnosis not present

## 2017-11-06 DIAGNOSIS — D631 Anemia in chronic kidney disease: Secondary | ICD-10-CM | POA: Diagnosis not present

## 2017-11-06 DIAGNOSIS — D509 Iron deficiency anemia, unspecified: Secondary | ICD-10-CM | POA: Diagnosis not present

## 2017-11-06 DIAGNOSIS — N2581 Secondary hyperparathyroidism of renal origin: Secondary | ICD-10-CM | POA: Diagnosis not present

## 2017-11-06 DIAGNOSIS — Z4932 Encounter for adequacy testing for peritoneal dialysis: Secondary | ICD-10-CM | POA: Diagnosis not present

## 2017-11-07 DIAGNOSIS — D631 Anemia in chronic kidney disease: Secondary | ICD-10-CM | POA: Diagnosis not present

## 2017-11-07 DIAGNOSIS — N2581 Secondary hyperparathyroidism of renal origin: Secondary | ICD-10-CM | POA: Diagnosis not present

## 2017-11-07 DIAGNOSIS — Z4932 Encounter for adequacy testing for peritoneal dialysis: Secondary | ICD-10-CM | POA: Diagnosis not present

## 2017-11-07 DIAGNOSIS — N2589 Other disorders resulting from impaired renal tubular function: Secondary | ICD-10-CM | POA: Diagnosis not present

## 2017-11-07 DIAGNOSIS — D509 Iron deficiency anemia, unspecified: Secondary | ICD-10-CM | POA: Diagnosis not present

## 2017-11-07 DIAGNOSIS — N186 End stage renal disease: Secondary | ICD-10-CM | POA: Diagnosis not present

## 2017-11-08 DIAGNOSIS — N186 End stage renal disease: Secondary | ICD-10-CM | POA: Diagnosis not present

## 2017-11-08 DIAGNOSIS — D509 Iron deficiency anemia, unspecified: Secondary | ICD-10-CM | POA: Diagnosis not present

## 2017-11-08 DIAGNOSIS — D631 Anemia in chronic kidney disease: Secondary | ICD-10-CM | POA: Diagnosis not present

## 2017-11-08 DIAGNOSIS — N2589 Other disorders resulting from impaired renal tubular function: Secondary | ICD-10-CM | POA: Diagnosis not present

## 2017-11-08 DIAGNOSIS — N2581 Secondary hyperparathyroidism of renal origin: Secondary | ICD-10-CM | POA: Diagnosis not present

## 2017-11-08 DIAGNOSIS — Z4932 Encounter for adequacy testing for peritoneal dialysis: Secondary | ICD-10-CM | POA: Diagnosis not present

## 2017-11-09 DIAGNOSIS — N2589 Other disorders resulting from impaired renal tubular function: Secondary | ICD-10-CM | POA: Diagnosis not present

## 2017-11-09 DIAGNOSIS — N2581 Secondary hyperparathyroidism of renal origin: Secondary | ICD-10-CM | POA: Diagnosis not present

## 2017-11-09 DIAGNOSIS — Z4932 Encounter for adequacy testing for peritoneal dialysis: Secondary | ICD-10-CM | POA: Diagnosis not present

## 2017-11-09 DIAGNOSIS — D631 Anemia in chronic kidney disease: Secondary | ICD-10-CM | POA: Diagnosis not present

## 2017-11-09 DIAGNOSIS — D509 Iron deficiency anemia, unspecified: Secondary | ICD-10-CM | POA: Diagnosis not present

## 2017-11-09 DIAGNOSIS — N186 End stage renal disease: Secondary | ICD-10-CM | POA: Diagnosis not present

## 2017-11-10 DIAGNOSIS — N186 End stage renal disease: Secondary | ICD-10-CM | POA: Diagnosis not present

## 2017-11-10 DIAGNOSIS — D509 Iron deficiency anemia, unspecified: Secondary | ICD-10-CM | POA: Diagnosis not present

## 2017-11-10 DIAGNOSIS — N2589 Other disorders resulting from impaired renal tubular function: Secondary | ICD-10-CM | POA: Diagnosis not present

## 2017-11-10 DIAGNOSIS — D631 Anemia in chronic kidney disease: Secondary | ICD-10-CM | POA: Diagnosis not present

## 2017-11-10 DIAGNOSIS — N2581 Secondary hyperparathyroidism of renal origin: Secondary | ICD-10-CM | POA: Diagnosis not present

## 2017-11-10 DIAGNOSIS — Z4932 Encounter for adequacy testing for peritoneal dialysis: Secondary | ICD-10-CM | POA: Diagnosis not present

## 2017-11-11 DIAGNOSIS — D631 Anemia in chronic kidney disease: Secondary | ICD-10-CM | POA: Diagnosis not present

## 2017-11-11 DIAGNOSIS — D509 Iron deficiency anemia, unspecified: Secondary | ICD-10-CM | POA: Diagnosis not present

## 2017-11-11 DIAGNOSIS — N2589 Other disorders resulting from impaired renal tubular function: Secondary | ICD-10-CM | POA: Diagnosis not present

## 2017-11-11 DIAGNOSIS — Z4932 Encounter for adequacy testing for peritoneal dialysis: Secondary | ICD-10-CM | POA: Diagnosis not present

## 2017-11-11 DIAGNOSIS — N186 End stage renal disease: Secondary | ICD-10-CM | POA: Diagnosis not present

## 2017-11-11 DIAGNOSIS — N2581 Secondary hyperparathyroidism of renal origin: Secondary | ICD-10-CM | POA: Diagnosis not present

## 2017-11-12 DIAGNOSIS — D631 Anemia in chronic kidney disease: Secondary | ICD-10-CM | POA: Diagnosis not present

## 2017-11-12 DIAGNOSIS — D509 Iron deficiency anemia, unspecified: Secondary | ICD-10-CM | POA: Diagnosis not present

## 2017-11-12 DIAGNOSIS — N2581 Secondary hyperparathyroidism of renal origin: Secondary | ICD-10-CM | POA: Diagnosis not present

## 2017-11-12 DIAGNOSIS — N2589 Other disorders resulting from impaired renal tubular function: Secondary | ICD-10-CM | POA: Diagnosis not present

## 2017-11-12 DIAGNOSIS — Z4932 Encounter for adequacy testing for peritoneal dialysis: Secondary | ICD-10-CM | POA: Diagnosis not present

## 2017-11-12 DIAGNOSIS — N186 End stage renal disease: Secondary | ICD-10-CM | POA: Diagnosis not present

## 2017-11-13 DIAGNOSIS — D509 Iron deficiency anemia, unspecified: Secondary | ICD-10-CM | POA: Diagnosis not present

## 2017-11-13 DIAGNOSIS — D631 Anemia in chronic kidney disease: Secondary | ICD-10-CM | POA: Diagnosis not present

## 2017-11-13 DIAGNOSIS — N2589 Other disorders resulting from impaired renal tubular function: Secondary | ICD-10-CM | POA: Diagnosis not present

## 2017-11-13 DIAGNOSIS — N186 End stage renal disease: Secondary | ICD-10-CM | POA: Diagnosis not present

## 2017-11-13 DIAGNOSIS — N2581 Secondary hyperparathyroidism of renal origin: Secondary | ICD-10-CM | POA: Diagnosis not present

## 2017-11-13 DIAGNOSIS — Z4932 Encounter for adequacy testing for peritoneal dialysis: Secondary | ICD-10-CM | POA: Diagnosis not present

## 2017-11-14 DIAGNOSIS — Z4932 Encounter for adequacy testing for peritoneal dialysis: Secondary | ICD-10-CM | POA: Diagnosis not present

## 2017-11-14 DIAGNOSIS — N2589 Other disorders resulting from impaired renal tubular function: Secondary | ICD-10-CM | POA: Diagnosis not present

## 2017-11-14 DIAGNOSIS — D509 Iron deficiency anemia, unspecified: Secondary | ICD-10-CM | POA: Diagnosis not present

## 2017-11-14 DIAGNOSIS — N186 End stage renal disease: Secondary | ICD-10-CM | POA: Diagnosis not present

## 2017-11-14 DIAGNOSIS — N2581 Secondary hyperparathyroidism of renal origin: Secondary | ICD-10-CM | POA: Diagnosis not present

## 2017-11-14 DIAGNOSIS — D631 Anemia in chronic kidney disease: Secondary | ICD-10-CM | POA: Diagnosis not present

## 2017-11-15 DIAGNOSIS — N186 End stage renal disease: Secondary | ICD-10-CM | POA: Diagnosis not present

## 2017-11-15 DIAGNOSIS — D509 Iron deficiency anemia, unspecified: Secondary | ICD-10-CM | POA: Diagnosis not present

## 2017-11-15 DIAGNOSIS — D631 Anemia in chronic kidney disease: Secondary | ICD-10-CM | POA: Diagnosis not present

## 2017-11-15 DIAGNOSIS — Z4932 Encounter for adequacy testing for peritoneal dialysis: Secondary | ICD-10-CM | POA: Diagnosis not present

## 2017-11-15 DIAGNOSIS — N2581 Secondary hyperparathyroidism of renal origin: Secondary | ICD-10-CM | POA: Diagnosis not present

## 2017-11-15 DIAGNOSIS — N2589 Other disorders resulting from impaired renal tubular function: Secondary | ICD-10-CM | POA: Diagnosis not present

## 2017-11-16 DIAGNOSIS — D631 Anemia in chronic kidney disease: Secondary | ICD-10-CM | POA: Diagnosis not present

## 2017-11-16 DIAGNOSIS — D509 Iron deficiency anemia, unspecified: Secondary | ICD-10-CM | POA: Diagnosis not present

## 2017-11-16 DIAGNOSIS — N2589 Other disorders resulting from impaired renal tubular function: Secondary | ICD-10-CM | POA: Diagnosis not present

## 2017-11-16 DIAGNOSIS — N2581 Secondary hyperparathyroidism of renal origin: Secondary | ICD-10-CM | POA: Diagnosis not present

## 2017-11-16 DIAGNOSIS — N186 End stage renal disease: Secondary | ICD-10-CM | POA: Diagnosis not present

## 2017-11-16 DIAGNOSIS — Z4932 Encounter for adequacy testing for peritoneal dialysis: Secondary | ICD-10-CM | POA: Diagnosis not present

## 2017-11-17 DIAGNOSIS — N2589 Other disorders resulting from impaired renal tubular function: Secondary | ICD-10-CM | POA: Diagnosis not present

## 2017-11-17 DIAGNOSIS — N186 End stage renal disease: Secondary | ICD-10-CM | POA: Diagnosis not present

## 2017-11-17 DIAGNOSIS — Z4932 Encounter for adequacy testing for peritoneal dialysis: Secondary | ICD-10-CM | POA: Diagnosis not present

## 2017-11-17 DIAGNOSIS — D509 Iron deficiency anemia, unspecified: Secondary | ICD-10-CM | POA: Diagnosis not present

## 2017-11-17 DIAGNOSIS — N2581 Secondary hyperparathyroidism of renal origin: Secondary | ICD-10-CM | POA: Diagnosis not present

## 2017-11-17 DIAGNOSIS — D631 Anemia in chronic kidney disease: Secondary | ICD-10-CM | POA: Diagnosis not present

## 2017-11-18 DIAGNOSIS — D509 Iron deficiency anemia, unspecified: Secondary | ICD-10-CM | POA: Diagnosis not present

## 2017-11-18 DIAGNOSIS — D631 Anemia in chronic kidney disease: Secondary | ICD-10-CM | POA: Diagnosis not present

## 2017-11-18 DIAGNOSIS — Z4932 Encounter for adequacy testing for peritoneal dialysis: Secondary | ICD-10-CM | POA: Diagnosis not present

## 2017-11-18 DIAGNOSIS — N2589 Other disorders resulting from impaired renal tubular function: Secondary | ICD-10-CM | POA: Diagnosis not present

## 2017-11-18 DIAGNOSIS — Z01818 Encounter for other preprocedural examination: Secondary | ICD-10-CM | POA: Insufficient documentation

## 2017-11-18 DIAGNOSIS — K4090 Unilateral inguinal hernia, without obstruction or gangrene, not specified as recurrent: Secondary | ICD-10-CM | POA: Insufficient documentation

## 2017-11-18 DIAGNOSIS — N2581 Secondary hyperparathyroidism of renal origin: Secondary | ICD-10-CM | POA: Diagnosis not present

## 2017-11-18 DIAGNOSIS — N186 End stage renal disease: Secondary | ICD-10-CM | POA: Diagnosis not present

## 2017-11-19 DIAGNOSIS — Z4932 Encounter for adequacy testing for peritoneal dialysis: Secondary | ICD-10-CM | POA: Diagnosis not present

## 2017-11-19 DIAGNOSIS — D631 Anemia in chronic kidney disease: Secondary | ICD-10-CM | POA: Diagnosis not present

## 2017-11-19 DIAGNOSIS — D509 Iron deficiency anemia, unspecified: Secondary | ICD-10-CM | POA: Diagnosis not present

## 2017-11-19 DIAGNOSIS — N2581 Secondary hyperparathyroidism of renal origin: Secondary | ICD-10-CM | POA: Diagnosis not present

## 2017-11-19 DIAGNOSIS — N2589 Other disorders resulting from impaired renal tubular function: Secondary | ICD-10-CM | POA: Diagnosis not present

## 2017-11-19 DIAGNOSIS — N186 End stage renal disease: Secondary | ICD-10-CM | POA: Diagnosis not present

## 2017-11-20 DIAGNOSIS — D509 Iron deficiency anemia, unspecified: Secondary | ICD-10-CM | POA: Diagnosis not present

## 2017-11-20 DIAGNOSIS — N2581 Secondary hyperparathyroidism of renal origin: Secondary | ICD-10-CM | POA: Diagnosis not present

## 2017-11-20 DIAGNOSIS — E440 Moderate protein-calorie malnutrition: Secondary | ICD-10-CM | POA: Diagnosis not present

## 2017-11-20 DIAGNOSIS — Z992 Dependence on renal dialysis: Secondary | ICD-10-CM | POA: Diagnosis not present

## 2017-11-20 DIAGNOSIS — Z79899 Other long term (current) drug therapy: Secondary | ICD-10-CM | POA: Diagnosis not present

## 2017-11-20 DIAGNOSIS — N186 End stage renal disease: Secondary | ICD-10-CM | POA: Diagnosis not present

## 2017-11-20 DIAGNOSIS — I129 Hypertensive chronic kidney disease with stage 1 through stage 4 chronic kidney disease, or unspecified chronic kidney disease: Secondary | ICD-10-CM | POA: Diagnosis not present

## 2017-11-20 DIAGNOSIS — R17 Unspecified jaundice: Secondary | ICD-10-CM | POA: Diagnosis not present

## 2017-11-20 DIAGNOSIS — Z4932 Encounter for adequacy testing for peritoneal dialysis: Secondary | ICD-10-CM | POA: Diagnosis not present

## 2017-11-20 DIAGNOSIS — D631 Anemia in chronic kidney disease: Secondary | ICD-10-CM | POA: Diagnosis not present

## 2017-11-21 DIAGNOSIS — N186 End stage renal disease: Secondary | ICD-10-CM | POA: Diagnosis not present

## 2017-11-21 DIAGNOSIS — Z4932 Encounter for adequacy testing for peritoneal dialysis: Secondary | ICD-10-CM | POA: Diagnosis not present

## 2017-11-21 DIAGNOSIS — Z79899 Other long term (current) drug therapy: Secondary | ICD-10-CM | POA: Diagnosis not present

## 2017-11-21 DIAGNOSIS — D631 Anemia in chronic kidney disease: Secondary | ICD-10-CM | POA: Diagnosis not present

## 2017-11-21 DIAGNOSIS — N2581 Secondary hyperparathyroidism of renal origin: Secondary | ICD-10-CM | POA: Diagnosis not present

## 2017-11-21 DIAGNOSIS — E440 Moderate protein-calorie malnutrition: Secondary | ICD-10-CM | POA: Diagnosis not present

## 2017-11-22 DIAGNOSIS — N2581 Secondary hyperparathyroidism of renal origin: Secondary | ICD-10-CM | POA: Diagnosis not present

## 2017-11-22 DIAGNOSIS — D631 Anemia in chronic kidney disease: Secondary | ICD-10-CM | POA: Diagnosis not present

## 2017-11-22 DIAGNOSIS — N186 End stage renal disease: Secondary | ICD-10-CM | POA: Diagnosis not present

## 2017-11-22 DIAGNOSIS — Z4932 Encounter for adequacy testing for peritoneal dialysis: Secondary | ICD-10-CM | POA: Diagnosis not present

## 2017-11-22 DIAGNOSIS — Z79899 Other long term (current) drug therapy: Secondary | ICD-10-CM | POA: Diagnosis not present

## 2017-11-22 DIAGNOSIS — E440 Moderate protein-calorie malnutrition: Secondary | ICD-10-CM | POA: Diagnosis not present

## 2017-11-23 DIAGNOSIS — E440 Moderate protein-calorie malnutrition: Secondary | ICD-10-CM | POA: Diagnosis not present

## 2017-11-23 DIAGNOSIS — Z4932 Encounter for adequacy testing for peritoneal dialysis: Secondary | ICD-10-CM | POA: Diagnosis not present

## 2017-11-23 DIAGNOSIS — N186 End stage renal disease: Secondary | ICD-10-CM | POA: Diagnosis not present

## 2017-11-23 DIAGNOSIS — Z79899 Other long term (current) drug therapy: Secondary | ICD-10-CM | POA: Diagnosis not present

## 2017-11-23 DIAGNOSIS — N2581 Secondary hyperparathyroidism of renal origin: Secondary | ICD-10-CM | POA: Diagnosis not present

## 2017-11-23 DIAGNOSIS — D631 Anemia in chronic kidney disease: Secondary | ICD-10-CM | POA: Diagnosis not present

## 2017-11-24 DIAGNOSIS — Z79899 Other long term (current) drug therapy: Secondary | ICD-10-CM | POA: Diagnosis not present

## 2017-11-24 DIAGNOSIS — Z4932 Encounter for adequacy testing for peritoneal dialysis: Secondary | ICD-10-CM | POA: Diagnosis not present

## 2017-11-24 DIAGNOSIS — E440 Moderate protein-calorie malnutrition: Secondary | ICD-10-CM | POA: Diagnosis not present

## 2017-11-24 DIAGNOSIS — N2581 Secondary hyperparathyroidism of renal origin: Secondary | ICD-10-CM | POA: Diagnosis not present

## 2017-11-24 DIAGNOSIS — D631 Anemia in chronic kidney disease: Secondary | ICD-10-CM | POA: Diagnosis not present

## 2017-11-24 DIAGNOSIS — N186 End stage renal disease: Secondary | ICD-10-CM | POA: Diagnosis not present

## 2017-11-25 DIAGNOSIS — Z4932 Encounter for adequacy testing for peritoneal dialysis: Secondary | ICD-10-CM | POA: Diagnosis not present

## 2017-11-25 DIAGNOSIS — N186 End stage renal disease: Secondary | ICD-10-CM | POA: Diagnosis not present

## 2017-11-25 DIAGNOSIS — D631 Anemia in chronic kidney disease: Secondary | ICD-10-CM | POA: Diagnosis not present

## 2017-11-25 DIAGNOSIS — R82994 Hypercalciuria: Secondary | ICD-10-CM | POA: Diagnosis not present

## 2017-11-25 DIAGNOSIS — Z79899 Other long term (current) drug therapy: Secondary | ICD-10-CM | POA: Diagnosis not present

## 2017-11-25 DIAGNOSIS — N2581 Secondary hyperparathyroidism of renal origin: Secondary | ICD-10-CM | POA: Diagnosis not present

## 2017-11-25 DIAGNOSIS — E440 Moderate protein-calorie malnutrition: Secondary | ICD-10-CM | POA: Diagnosis not present

## 2017-11-26 DIAGNOSIS — Z4932 Encounter for adequacy testing for peritoneal dialysis: Secondary | ICD-10-CM | POA: Diagnosis not present

## 2017-11-26 DIAGNOSIS — Z79899 Other long term (current) drug therapy: Secondary | ICD-10-CM | POA: Diagnosis not present

## 2017-11-26 DIAGNOSIS — N2581 Secondary hyperparathyroidism of renal origin: Secondary | ICD-10-CM | POA: Diagnosis not present

## 2017-11-26 DIAGNOSIS — D631 Anemia in chronic kidney disease: Secondary | ICD-10-CM | POA: Diagnosis not present

## 2017-11-26 DIAGNOSIS — N186 End stage renal disease: Secondary | ICD-10-CM | POA: Diagnosis not present

## 2017-11-26 DIAGNOSIS — E440 Moderate protein-calorie malnutrition: Secondary | ICD-10-CM | POA: Diagnosis not present

## 2017-11-27 DIAGNOSIS — Z79899 Other long term (current) drug therapy: Secondary | ICD-10-CM | POA: Diagnosis not present

## 2017-11-27 DIAGNOSIS — N2581 Secondary hyperparathyroidism of renal origin: Secondary | ICD-10-CM | POA: Diagnosis not present

## 2017-11-27 DIAGNOSIS — D631 Anemia in chronic kidney disease: Secondary | ICD-10-CM | POA: Diagnosis not present

## 2017-11-27 DIAGNOSIS — E440 Moderate protein-calorie malnutrition: Secondary | ICD-10-CM | POA: Diagnosis not present

## 2017-11-27 DIAGNOSIS — N186 End stage renal disease: Secondary | ICD-10-CM | POA: Diagnosis not present

## 2017-11-27 DIAGNOSIS — Z4932 Encounter for adequacy testing for peritoneal dialysis: Secondary | ICD-10-CM | POA: Diagnosis not present

## 2017-11-28 DIAGNOSIS — N2581 Secondary hyperparathyroidism of renal origin: Secondary | ICD-10-CM | POA: Diagnosis not present

## 2017-11-28 DIAGNOSIS — Z4932 Encounter for adequacy testing for peritoneal dialysis: Secondary | ICD-10-CM | POA: Diagnosis not present

## 2017-11-28 DIAGNOSIS — N186 End stage renal disease: Secondary | ICD-10-CM | POA: Diagnosis not present

## 2017-11-28 DIAGNOSIS — E440 Moderate protein-calorie malnutrition: Secondary | ICD-10-CM | POA: Diagnosis not present

## 2017-11-28 DIAGNOSIS — Z79899 Other long term (current) drug therapy: Secondary | ICD-10-CM | POA: Diagnosis not present

## 2017-11-28 DIAGNOSIS — D631 Anemia in chronic kidney disease: Secondary | ICD-10-CM | POA: Diagnosis not present

## 2017-11-29 DIAGNOSIS — E440 Moderate protein-calorie malnutrition: Secondary | ICD-10-CM | POA: Diagnosis not present

## 2017-11-29 DIAGNOSIS — Z4932 Encounter for adequacy testing for peritoneal dialysis: Secondary | ICD-10-CM | POA: Diagnosis not present

## 2017-11-29 DIAGNOSIS — N2581 Secondary hyperparathyroidism of renal origin: Secondary | ICD-10-CM | POA: Diagnosis not present

## 2017-11-29 DIAGNOSIS — Z79899 Other long term (current) drug therapy: Secondary | ICD-10-CM | POA: Diagnosis not present

## 2017-11-29 DIAGNOSIS — D631 Anemia in chronic kidney disease: Secondary | ICD-10-CM | POA: Diagnosis not present

## 2017-11-29 DIAGNOSIS — N186 End stage renal disease: Secondary | ICD-10-CM | POA: Diagnosis not present

## 2017-11-30 DIAGNOSIS — E440 Moderate protein-calorie malnutrition: Secondary | ICD-10-CM | POA: Diagnosis not present

## 2017-11-30 DIAGNOSIS — Z4932 Encounter for adequacy testing for peritoneal dialysis: Secondary | ICD-10-CM | POA: Diagnosis not present

## 2017-11-30 DIAGNOSIS — Z79899 Other long term (current) drug therapy: Secondary | ICD-10-CM | POA: Diagnosis not present

## 2017-11-30 DIAGNOSIS — N186 End stage renal disease: Secondary | ICD-10-CM | POA: Diagnosis not present

## 2017-11-30 DIAGNOSIS — D631 Anemia in chronic kidney disease: Secondary | ICD-10-CM | POA: Diagnosis not present

## 2017-11-30 DIAGNOSIS — N2581 Secondary hyperparathyroidism of renal origin: Secondary | ICD-10-CM | POA: Diagnosis not present

## 2017-12-01 DIAGNOSIS — Z4932 Encounter for adequacy testing for peritoneal dialysis: Secondary | ICD-10-CM | POA: Diagnosis not present

## 2017-12-01 DIAGNOSIS — N2581 Secondary hyperparathyroidism of renal origin: Secondary | ICD-10-CM | POA: Diagnosis not present

## 2017-12-01 DIAGNOSIS — N186 End stage renal disease: Secondary | ICD-10-CM | POA: Diagnosis not present

## 2017-12-01 DIAGNOSIS — E440 Moderate protein-calorie malnutrition: Secondary | ICD-10-CM | POA: Diagnosis not present

## 2017-12-01 DIAGNOSIS — D631 Anemia in chronic kidney disease: Secondary | ICD-10-CM | POA: Diagnosis not present

## 2017-12-01 DIAGNOSIS — Z79899 Other long term (current) drug therapy: Secondary | ICD-10-CM | POA: Diagnosis not present

## 2017-12-02 DIAGNOSIS — N186 End stage renal disease: Secondary | ICD-10-CM | POA: Diagnosis not present

## 2017-12-02 DIAGNOSIS — Z4932 Encounter for adequacy testing for peritoneal dialysis: Secondary | ICD-10-CM | POA: Diagnosis not present

## 2017-12-02 DIAGNOSIS — N2581 Secondary hyperparathyroidism of renal origin: Secondary | ICD-10-CM | POA: Diagnosis not present

## 2017-12-02 DIAGNOSIS — E440 Moderate protein-calorie malnutrition: Secondary | ICD-10-CM | POA: Diagnosis not present

## 2017-12-02 DIAGNOSIS — Z79899 Other long term (current) drug therapy: Secondary | ICD-10-CM | POA: Diagnosis not present

## 2017-12-02 DIAGNOSIS — D631 Anemia in chronic kidney disease: Secondary | ICD-10-CM | POA: Diagnosis not present

## 2017-12-03 DIAGNOSIS — Z4932 Encounter for adequacy testing for peritoneal dialysis: Secondary | ICD-10-CM | POA: Diagnosis not present

## 2017-12-03 DIAGNOSIS — N2581 Secondary hyperparathyroidism of renal origin: Secondary | ICD-10-CM | POA: Diagnosis not present

## 2017-12-03 DIAGNOSIS — E440 Moderate protein-calorie malnutrition: Secondary | ICD-10-CM | POA: Diagnosis not present

## 2017-12-03 DIAGNOSIS — Z79899 Other long term (current) drug therapy: Secondary | ICD-10-CM | POA: Diagnosis not present

## 2017-12-03 DIAGNOSIS — D631 Anemia in chronic kidney disease: Secondary | ICD-10-CM | POA: Diagnosis not present

## 2017-12-03 DIAGNOSIS — N186 End stage renal disease: Secondary | ICD-10-CM | POA: Diagnosis not present

## 2017-12-04 ENCOUNTER — Ambulatory Visit (INDEPENDENT_AMBULATORY_CARE_PROVIDER_SITE_OTHER): Payer: Medicare Other | Admitting: Cardiovascular Disease

## 2017-12-04 ENCOUNTER — Encounter: Payer: Self-pay | Admitting: Cardiovascular Disease

## 2017-12-04 VITALS — BP 156/94 | HR 73 | Ht 63.0 in | Wt 165.0 lb

## 2017-12-04 DIAGNOSIS — E7800 Pure hypercholesterolemia, unspecified: Secondary | ICD-10-CM

## 2017-12-04 DIAGNOSIS — Z4932 Encounter for adequacy testing for peritoneal dialysis: Secondary | ICD-10-CM | POA: Diagnosis not present

## 2017-12-04 DIAGNOSIS — Z79899 Other long term (current) drug therapy: Secondary | ICD-10-CM | POA: Diagnosis not present

## 2017-12-04 DIAGNOSIS — E440 Moderate protein-calorie malnutrition: Secondary | ICD-10-CM | POA: Diagnosis not present

## 2017-12-04 DIAGNOSIS — N186 End stage renal disease: Secondary | ICD-10-CM | POA: Diagnosis not present

## 2017-12-04 DIAGNOSIS — N2581 Secondary hyperparathyroidism of renal origin: Secondary | ICD-10-CM | POA: Diagnosis not present

## 2017-12-04 DIAGNOSIS — I10 Essential (primary) hypertension: Secondary | ICD-10-CM

## 2017-12-04 DIAGNOSIS — D631 Anemia in chronic kidney disease: Secondary | ICD-10-CM | POA: Diagnosis not present

## 2017-12-04 DIAGNOSIS — R002 Palpitations: Secondary | ICD-10-CM

## 2017-12-04 MED ORDER — LOSARTAN POTASSIUM 100 MG PO TABS: 100.0000 mg | ORAL_TABLET | Freq: Every day | ORAL | 3 refills | Status: DC

## 2017-12-04 MED ORDER — ATENOLOL 25 MG PO TABS: 25.0000 mg | ORAL_TABLET | Freq: Two times a day (BID) | ORAL | 1 refills | Status: DC

## 2017-12-04 NOTE — Progress Notes (Signed)
12/04/2017 Lajuana Ripple   Jul 12, 1961  [id number]  Primary Physician Wenda Low, MD Primary Cardiologist: Lorretta Harp MD Garret [place], [place], [place]  HPI:  Maureen Barnes is a 57 y.o.  moderately overweight single African-American female mother of 3 children referred by Dr. Lysle Rubens  for cardiovascular evaluation because of symptomatic palpitations.  I last saw her in the office 04/24/2017.  She has a history of true hypertension, hyperlipidemia and diabetes. She has never smoked. She currently does not work but previously worked as a Cytogeneticist. She's had chronic renal insufficiency for years with anticipation of needing an AV fistula in the near future. She does complain of dyspnea on exertion and some atypical chest pain as well as symptomatic palpitations. She has chronic hepatitis C  Since I saw her last she did start hemodialysis October of last year.  She was taking carvedilol twice daily but this was causing symptomatic hypotension and was not really benefiting her palpitations.     Current Meds  Medication Sig  . amLODipine (NORVASC) 10 MG tablet Take 10 mg by mouth daily.    Marland Kitchen aspirin EC 81 MG tablet Take 81 mg by mouth daily.  . ciclopirox (PENLAC) 8 % solution Apply topically at bedtime. Apply over nail and surrounding skin. Apply daily over previous coat. Remove weekly with file or polish remover.  . cyclobenzaprine (FLEXERIL) 10 MG tablet Take 1 tablet by mouth as directed.  . diphenhydrAMINE (BENADRYL) 25 MG tablet Take 1 tablet (25 mg total) by mouth at bedtime as needed.  . Insulin Glargine (LANTUS SOLOSTAR [place]) Inject 26 Units into the skin daily.  Marland Kitchen loratadine (CLARITIN) 10 MG tablet Take 10 mg by mouth as directed.  . pravastatin (PRAVACHOL) 40 MG tablet Take 40 mg by mouth daily.  . [DISCONTINUED] carvedilol (COREG) 12.5 MG tablet Take 1.5 tablets (18.75 mg total) by mouth 2 (two) times daily with a meal.  . [DISCONTINUED]  losartan-hydrochlorothiazide (HYZAAR) 100-25 MG tablet Take 1 tablet by mouth daily.     Allergies  Allergen Reactions  . Iodine Itching and Swelling    Shellfish-lips swell itching.  . Peanut-Containing Drug Products Itching and Swelling  . Shellfish Allergy Itching and Swelling    Social History   Socioeconomic History  . Marital status: Single    Spouse name: Not on file  . Number of children: Not on file  . Years of education: Not on file  . Highest education level: Not on file  Occupational History  . Not on file  Social Needs  . Financial resource strain: Not on file  . Food insecurity:    Worry: Not on file    Inability: Not on file  . Transportation needs:    Medical: Not on file    Non-medical: Not on file  Tobacco Use  . Smoking status: Never Smoker  . Smokeless tobacco: Never Used  Substance and Sexual Activity  . Alcohol use: No  . Drug use: No  . Sexual activity: Not Currently    Birth control/protection: Surgical  Lifestyle  . Physical activity:    Days per week: Not on file    Minutes per session: Not on file  . Stress: Not on file  Relationships  . Social connections:    Talks on phone: Not on file    Gets together: Not on file    Attends religious service: Not on file    Active member of club or organization: Not on  file    Attends meetings of clubs or organizations: Not on file    Relationship status: Not on file  . Intimate partner violence:    Fear of current or ex partner: Not on file    Emotionally abused: Not on file    Physically abused: Not on file    Forced sexual activity: Not on file  Other Topics Concern  . Not on file  Social History Narrative  . Not on file     Review of Systems: General: negative for chills, fever, night sweats or weight changes.  Cardiovascular: negative for chest pain, dyspnea on exertion, edema, orthopnea, palpitations, paroxysmal nocturnal dyspnea or shortness of breath Dermatological: negative for  rash Respiratory: negative for cough or wheezing Urologic: negative for hematuria Abdominal: negative for nausea, vomiting, diarrhea, bright red blood per rectum, melena, or hematemesis Neurologic: negative for visual changes, syncope, or dizziness All other systems reviewed and are otherwise negative except as noted above.    Blood pressure (!) 156/94, pulse 73, height 5\' 3"  (1.6 m), weight 165 lb (74.8 kg), last menstrual period 07/10/2011.  General appearance: alert and no distress Neck: no adenopathy, no carotid bruit, no JVD, supple, symmetrical, trachea midline and thyroid not enlarged, symmetric, no tenderness/mass/nodules Lungs: clear to auscultation bilaterally Heart: regular rate and rhythm, S1, S2 normal, no murmur, click, rub or gallop Extremities: extremities normal, atraumatic, no cyanosis or edema Pulses: 2+ and symmetric Skin: Skin color, texture, turgor normal. No rashes or lesions Neurologic: Alert and oriented X 3, normal strength and tone. Normal symmetric reflexes. Normal coordination and gait  EKG sinus rhythm at 73 with septal Q waves and poor R wave progression. I  Personally reviewed this EKG  ASSESSMENT AND PLAN:   Hypertension History of essential hypertension blood pressure measured today at 156/94 . she is on amlodipine, carvedilol losartan and hydrochlorothiazide.  I am going to change her carvedilol to atenolol.  Hyperlipidemia History of hyperlipidemia on statin therapy.  Palpitations History of palpitations found to be PACs on event monitoring.  She is not tolerating carvedilol.  Switch her to atenolol 25 mg a day and will have her follow-up with mid-level provider in 4 weeks.      Lorretta Harp MD FACP,FACC,FAHA, [facility] 12/04/2017 4:04 PM

## 2017-12-04 NOTE — Assessment & Plan Note (Signed)
History of essential hypertension blood pressure measured today at 156/94 . she is on amlodipine, carvedilol losartan and hydrochlorothiazide.  I am going to change her carvedilol to atenolol.

## 2017-12-04 NOTE — Assessment & Plan Note (Signed)
History of palpitations found to be PACs on event monitoring.  She is not tolerating carvedilol.  Switch her to atenolol 25 mg a day and will have her follow-up with mid-level provider in 4 weeks.

## 2017-12-04 NOTE — Assessment & Plan Note (Signed)
History of hyperlipidemia on statin therapy. 

## 2017-12-04 NOTE — Patient Instructions (Addendum)
Medication Instructions: Your physician recommends that you continue on your current medications as directed. Please refer to the Current Medication list given to you today.  STOP Carvedilol and Losartan-HCTZ  START Atenolol 25 mg twice daily.  START Losartan 100 mg daily.   Follow-Up: We request that you follow-up in: 4 weeks with an extender and in 1 year with Dr Andria Rhein will receive a reminder letter in the mail two months in advance. If you don't receive a letter, please call our office to schedule the follow-up appointment.  If you need a refill on your cardiac medications before your next appointment, please call your pharmacy.

## 2017-12-05 DIAGNOSIS — D631 Anemia in chronic kidney disease: Secondary | ICD-10-CM | POA: Diagnosis not present

## 2017-12-05 DIAGNOSIS — E440 Moderate protein-calorie malnutrition: Secondary | ICD-10-CM | POA: Diagnosis not present

## 2017-12-05 DIAGNOSIS — Z79899 Other long term (current) drug therapy: Secondary | ICD-10-CM | POA: Diagnosis not present

## 2017-12-05 DIAGNOSIS — N186 End stage renal disease: Secondary | ICD-10-CM | POA: Diagnosis not present

## 2017-12-05 DIAGNOSIS — N2581 Secondary hyperparathyroidism of renal origin: Secondary | ICD-10-CM | POA: Diagnosis not present

## 2017-12-05 DIAGNOSIS — Z4932 Encounter for adequacy testing for peritoneal dialysis: Secondary | ICD-10-CM | POA: Diagnosis not present

## 2017-12-06 DIAGNOSIS — N186 End stage renal disease: Secondary | ICD-10-CM | POA: Diagnosis not present

## 2017-12-06 DIAGNOSIS — D631 Anemia in chronic kidney disease: Secondary | ICD-10-CM | POA: Diagnosis not present

## 2017-12-06 DIAGNOSIS — E440 Moderate protein-calorie malnutrition: Secondary | ICD-10-CM | POA: Diagnosis not present

## 2017-12-06 DIAGNOSIS — Z4932 Encounter for adequacy testing for peritoneal dialysis: Secondary | ICD-10-CM | POA: Diagnosis not present

## 2017-12-06 DIAGNOSIS — N2581 Secondary hyperparathyroidism of renal origin: Secondary | ICD-10-CM | POA: Diagnosis not present

## 2017-12-06 DIAGNOSIS — Z79899 Other long term (current) drug therapy: Secondary | ICD-10-CM | POA: Diagnosis not present

## 2017-12-07 DIAGNOSIS — N186 End stage renal disease: Secondary | ICD-10-CM | POA: Diagnosis not present

## 2017-12-07 DIAGNOSIS — Z4932 Encounter for adequacy testing for peritoneal dialysis: Secondary | ICD-10-CM | POA: Diagnosis not present

## 2017-12-07 DIAGNOSIS — E440 Moderate protein-calorie malnutrition: Secondary | ICD-10-CM | POA: Diagnosis not present

## 2017-12-07 DIAGNOSIS — N2581 Secondary hyperparathyroidism of renal origin: Secondary | ICD-10-CM | POA: Diagnosis not present

## 2017-12-07 DIAGNOSIS — D631 Anemia in chronic kidney disease: Secondary | ICD-10-CM | POA: Diagnosis not present

## 2017-12-07 DIAGNOSIS — Z79899 Other long term (current) drug therapy: Secondary | ICD-10-CM | POA: Diagnosis not present

## 2017-12-08 DIAGNOSIS — Z4932 Encounter for adequacy testing for peritoneal dialysis: Secondary | ICD-10-CM | POA: Diagnosis not present

## 2017-12-08 DIAGNOSIS — E440 Moderate protein-calorie malnutrition: Secondary | ICD-10-CM | POA: Diagnosis not present

## 2017-12-08 DIAGNOSIS — D631 Anemia in chronic kidney disease: Secondary | ICD-10-CM | POA: Diagnosis not present

## 2017-12-08 DIAGNOSIS — N2581 Secondary hyperparathyroidism of renal origin: Secondary | ICD-10-CM | POA: Diagnosis not present

## 2017-12-08 DIAGNOSIS — Z79899 Other long term (current) drug therapy: Secondary | ICD-10-CM | POA: Diagnosis not present

## 2017-12-08 DIAGNOSIS — N186 End stage renal disease: Secondary | ICD-10-CM | POA: Diagnosis not present

## 2017-12-09 DIAGNOSIS — E440 Moderate protein-calorie malnutrition: Secondary | ICD-10-CM | POA: Diagnosis not present

## 2017-12-09 DIAGNOSIS — N2581 Secondary hyperparathyroidism of renal origin: Secondary | ICD-10-CM | POA: Diagnosis not present

## 2017-12-09 DIAGNOSIS — Z4932 Encounter for adequacy testing for peritoneal dialysis: Secondary | ICD-10-CM | POA: Diagnosis not present

## 2017-12-09 DIAGNOSIS — N186 End stage renal disease: Secondary | ICD-10-CM | POA: Diagnosis not present

## 2017-12-09 DIAGNOSIS — D631 Anemia in chronic kidney disease: Secondary | ICD-10-CM | POA: Diagnosis not present

## 2017-12-09 DIAGNOSIS — Z79899 Other long term (current) drug therapy: Secondary | ICD-10-CM | POA: Diagnosis not present

## 2017-12-10 DIAGNOSIS — N2581 Secondary hyperparathyroidism of renal origin: Secondary | ICD-10-CM | POA: Diagnosis not present

## 2017-12-10 DIAGNOSIS — Z79899 Other long term (current) drug therapy: Secondary | ICD-10-CM | POA: Diagnosis not present

## 2017-12-10 DIAGNOSIS — E440 Moderate protein-calorie malnutrition: Secondary | ICD-10-CM | POA: Diagnosis not present

## 2017-12-10 DIAGNOSIS — N186 End stage renal disease: Secondary | ICD-10-CM | POA: Diagnosis not present

## 2017-12-10 DIAGNOSIS — Z4932 Encounter for adequacy testing for peritoneal dialysis: Secondary | ICD-10-CM | POA: Diagnosis not present

## 2017-12-10 DIAGNOSIS — D631 Anemia in chronic kidney disease: Secondary | ICD-10-CM | POA: Diagnosis not present

## 2017-12-11 DIAGNOSIS — N2581 Secondary hyperparathyroidism of renal origin: Secondary | ICD-10-CM | POA: Diagnosis not present

## 2017-12-11 DIAGNOSIS — Z4932 Encounter for adequacy testing for peritoneal dialysis: Secondary | ICD-10-CM | POA: Diagnosis not present

## 2017-12-11 DIAGNOSIS — E440 Moderate protein-calorie malnutrition: Secondary | ICD-10-CM | POA: Diagnosis not present

## 2017-12-11 DIAGNOSIS — Z79899 Other long term (current) drug therapy: Secondary | ICD-10-CM | POA: Diagnosis not present

## 2017-12-11 DIAGNOSIS — N186 End stage renal disease: Secondary | ICD-10-CM | POA: Diagnosis not present

## 2017-12-11 DIAGNOSIS — D631 Anemia in chronic kidney disease: Secondary | ICD-10-CM | POA: Diagnosis not present

## 2017-12-12 DIAGNOSIS — Z79899 Other long term (current) drug therapy: Secondary | ICD-10-CM | POA: Diagnosis not present

## 2017-12-12 DIAGNOSIS — E440 Moderate protein-calorie malnutrition: Secondary | ICD-10-CM | POA: Diagnosis not present

## 2017-12-12 DIAGNOSIS — D631 Anemia in chronic kidney disease: Secondary | ICD-10-CM | POA: Diagnosis not present

## 2017-12-12 DIAGNOSIS — Z4932 Encounter for adequacy testing for peritoneal dialysis: Secondary | ICD-10-CM | POA: Diagnosis not present

## 2017-12-12 DIAGNOSIS — N186 End stage renal disease: Secondary | ICD-10-CM | POA: Diagnosis not present

## 2017-12-12 DIAGNOSIS — N2581 Secondary hyperparathyroidism of renal origin: Secondary | ICD-10-CM | POA: Diagnosis not present

## 2017-12-13 DIAGNOSIS — N186 End stage renal disease: Secondary | ICD-10-CM | POA: Diagnosis not present

## 2017-12-13 DIAGNOSIS — Z4932 Encounter for adequacy testing for peritoneal dialysis: Secondary | ICD-10-CM | POA: Diagnosis not present

## 2017-12-13 DIAGNOSIS — Z79899 Other long term (current) drug therapy: Secondary | ICD-10-CM | POA: Diagnosis not present

## 2017-12-13 DIAGNOSIS — E440 Moderate protein-calorie malnutrition: Secondary | ICD-10-CM | POA: Diagnosis not present

## 2017-12-13 DIAGNOSIS — N2581 Secondary hyperparathyroidism of renal origin: Secondary | ICD-10-CM | POA: Diagnosis not present

## 2017-12-13 DIAGNOSIS — D631 Anemia in chronic kidney disease: Secondary | ICD-10-CM | POA: Diagnosis not present

## 2017-12-14 DIAGNOSIS — E440 Moderate protein-calorie malnutrition: Secondary | ICD-10-CM | POA: Diagnosis not present

## 2017-12-14 DIAGNOSIS — Z79899 Other long term (current) drug therapy: Secondary | ICD-10-CM | POA: Diagnosis not present

## 2017-12-14 DIAGNOSIS — N2581 Secondary hyperparathyroidism of renal origin: Secondary | ICD-10-CM | POA: Diagnosis not present

## 2017-12-14 DIAGNOSIS — N186 End stage renal disease: Secondary | ICD-10-CM | POA: Diagnosis not present

## 2017-12-14 DIAGNOSIS — D631 Anemia in chronic kidney disease: Secondary | ICD-10-CM | POA: Diagnosis not present

## 2017-12-14 DIAGNOSIS — Z4932 Encounter for adequacy testing for peritoneal dialysis: Secondary | ICD-10-CM | POA: Diagnosis not present

## 2017-12-15 DIAGNOSIS — D631 Anemia in chronic kidney disease: Secondary | ICD-10-CM | POA: Diagnosis not present

## 2017-12-15 DIAGNOSIS — Z4932 Encounter for adequacy testing for peritoneal dialysis: Secondary | ICD-10-CM | POA: Diagnosis not present

## 2017-12-15 DIAGNOSIS — N186 End stage renal disease: Secondary | ICD-10-CM | POA: Diagnosis not present

## 2017-12-15 DIAGNOSIS — N2581 Secondary hyperparathyroidism of renal origin: Secondary | ICD-10-CM | POA: Diagnosis not present

## 2017-12-15 DIAGNOSIS — E440 Moderate protein-calorie malnutrition: Secondary | ICD-10-CM | POA: Diagnosis not present

## 2017-12-15 DIAGNOSIS — Z79899 Other long term (current) drug therapy: Secondary | ICD-10-CM | POA: Diagnosis not present

## 2017-12-16 DIAGNOSIS — N2581 Secondary hyperparathyroidism of renal origin: Secondary | ICD-10-CM | POA: Diagnosis not present

## 2017-12-16 DIAGNOSIS — Z79899 Other long term (current) drug therapy: Secondary | ICD-10-CM | POA: Diagnosis not present

## 2017-12-16 DIAGNOSIS — E440 Moderate protein-calorie malnutrition: Secondary | ICD-10-CM | POA: Diagnosis not present

## 2017-12-16 DIAGNOSIS — Z4932 Encounter for adequacy testing for peritoneal dialysis: Secondary | ICD-10-CM | POA: Diagnosis not present

## 2017-12-16 DIAGNOSIS — N186 End stage renal disease: Secondary | ICD-10-CM | POA: Diagnosis not present

## 2017-12-16 DIAGNOSIS — D631 Anemia in chronic kidney disease: Secondary | ICD-10-CM | POA: Diagnosis not present

## 2017-12-17 DIAGNOSIS — N186 End stage renal disease: Secondary | ICD-10-CM | POA: Diagnosis not present

## 2017-12-17 DIAGNOSIS — N2581 Secondary hyperparathyroidism of renal origin: Secondary | ICD-10-CM | POA: Diagnosis not present

## 2017-12-17 DIAGNOSIS — Z79899 Other long term (current) drug therapy: Secondary | ICD-10-CM | POA: Diagnosis not present

## 2017-12-17 DIAGNOSIS — D631 Anemia in chronic kidney disease: Secondary | ICD-10-CM | POA: Diagnosis not present

## 2017-12-17 DIAGNOSIS — Z4932 Encounter for adequacy testing for peritoneal dialysis: Secondary | ICD-10-CM | POA: Diagnosis not present

## 2017-12-17 DIAGNOSIS — E440 Moderate protein-calorie malnutrition: Secondary | ICD-10-CM | POA: Diagnosis not present

## 2017-12-18 DIAGNOSIS — D631 Anemia in chronic kidney disease: Secondary | ICD-10-CM | POA: Diagnosis not present

## 2017-12-18 DIAGNOSIS — Z79899 Other long term (current) drug therapy: Secondary | ICD-10-CM | POA: Diagnosis not present

## 2017-12-18 DIAGNOSIS — E440 Moderate protein-calorie malnutrition: Secondary | ICD-10-CM | POA: Diagnosis not present

## 2017-12-18 DIAGNOSIS — Z4932 Encounter for adequacy testing for peritoneal dialysis: Secondary | ICD-10-CM | POA: Diagnosis not present

## 2017-12-18 DIAGNOSIS — N2581 Secondary hyperparathyroidism of renal origin: Secondary | ICD-10-CM | POA: Diagnosis not present

## 2017-12-18 DIAGNOSIS — N186 End stage renal disease: Secondary | ICD-10-CM | POA: Diagnosis not present

## 2017-12-19 DIAGNOSIS — Z4932 Encounter for adequacy testing for peritoneal dialysis: Secondary | ICD-10-CM | POA: Diagnosis not present

## 2017-12-19 DIAGNOSIS — N2581 Secondary hyperparathyroidism of renal origin: Secondary | ICD-10-CM | POA: Diagnosis not present

## 2017-12-19 DIAGNOSIS — Z79899 Other long term (current) drug therapy: Secondary | ICD-10-CM | POA: Diagnosis not present

## 2017-12-19 DIAGNOSIS — D631 Anemia in chronic kidney disease: Secondary | ICD-10-CM | POA: Diagnosis not present

## 2017-12-19 DIAGNOSIS — E440 Moderate protein-calorie malnutrition: Secondary | ICD-10-CM | POA: Diagnosis not present

## 2017-12-19 DIAGNOSIS — N186 End stage renal disease: Secondary | ICD-10-CM | POA: Diagnosis not present

## 2017-12-20 DIAGNOSIS — D631 Anemia in chronic kidney disease: Secondary | ICD-10-CM | POA: Diagnosis not present

## 2017-12-20 DIAGNOSIS — N186 End stage renal disease: Secondary | ICD-10-CM | POA: Diagnosis not present

## 2017-12-20 DIAGNOSIS — Z79899 Other long term (current) drug therapy: Secondary | ICD-10-CM | POA: Diagnosis not present

## 2017-12-20 DIAGNOSIS — Z4932 Encounter for adequacy testing for peritoneal dialysis: Secondary | ICD-10-CM | POA: Diagnosis not present

## 2017-12-20 DIAGNOSIS — E440 Moderate protein-calorie malnutrition: Secondary | ICD-10-CM | POA: Diagnosis not present

## 2017-12-20 DIAGNOSIS — N2581 Secondary hyperparathyroidism of renal origin: Secondary | ICD-10-CM | POA: Diagnosis not present

## 2017-12-21 DIAGNOSIS — Z992 Dependence on renal dialysis: Secondary | ICD-10-CM | POA: Diagnosis not present

## 2017-12-21 DIAGNOSIS — D509 Iron deficiency anemia, unspecified: Secondary | ICD-10-CM | POA: Diagnosis not present

## 2017-12-21 DIAGNOSIS — E440 Moderate protein-calorie malnutrition: Secondary | ICD-10-CM | POA: Diagnosis not present

## 2017-12-21 DIAGNOSIS — R17 Unspecified jaundice: Secondary | ICD-10-CM | POA: Diagnosis not present

## 2017-12-21 DIAGNOSIS — Z79899 Other long term (current) drug therapy: Secondary | ICD-10-CM | POA: Diagnosis not present

## 2017-12-21 DIAGNOSIS — Z4932 Encounter for adequacy testing for peritoneal dialysis: Secondary | ICD-10-CM | POA: Diagnosis not present

## 2017-12-21 DIAGNOSIS — N186 End stage renal disease: Secondary | ICD-10-CM | POA: Diagnosis not present

## 2017-12-21 DIAGNOSIS — D631 Anemia in chronic kidney disease: Secondary | ICD-10-CM | POA: Diagnosis not present

## 2017-12-21 DIAGNOSIS — I129 Hypertensive chronic kidney disease with stage 1 through stage 4 chronic kidney disease, or unspecified chronic kidney disease: Secondary | ICD-10-CM | POA: Diagnosis not present

## 2017-12-21 DIAGNOSIS — N2581 Secondary hyperparathyroidism of renal origin: Secondary | ICD-10-CM | POA: Diagnosis not present

## 2017-12-22 DIAGNOSIS — R17 Unspecified jaundice: Secondary | ICD-10-CM | POA: Diagnosis not present

## 2017-12-22 DIAGNOSIS — Z4932 Encounter for adequacy testing for peritoneal dialysis: Secondary | ICD-10-CM | POA: Diagnosis not present

## 2017-12-22 DIAGNOSIS — D631 Anemia in chronic kidney disease: Secondary | ICD-10-CM | POA: Diagnosis not present

## 2017-12-22 DIAGNOSIS — Z79899 Other long term (current) drug therapy: Secondary | ICD-10-CM | POA: Diagnosis not present

## 2017-12-22 DIAGNOSIS — E440 Moderate protein-calorie malnutrition: Secondary | ICD-10-CM | POA: Diagnosis not present

## 2017-12-22 DIAGNOSIS — N186 End stage renal disease: Secondary | ICD-10-CM | POA: Diagnosis not present

## 2017-12-23 DIAGNOSIS — Z4932 Encounter for adequacy testing for peritoneal dialysis: Secondary | ICD-10-CM | POA: Diagnosis not present

## 2017-12-23 DIAGNOSIS — Z79899 Other long term (current) drug therapy: Secondary | ICD-10-CM | POA: Diagnosis not present

## 2017-12-23 DIAGNOSIS — E440 Moderate protein-calorie malnutrition: Secondary | ICD-10-CM | POA: Diagnosis not present

## 2017-12-23 DIAGNOSIS — D631 Anemia in chronic kidney disease: Secondary | ICD-10-CM | POA: Diagnosis not present

## 2017-12-23 DIAGNOSIS — R17 Unspecified jaundice: Secondary | ICD-10-CM | POA: Diagnosis not present

## 2017-12-23 DIAGNOSIS — N186 End stage renal disease: Secondary | ICD-10-CM | POA: Diagnosis not present

## 2017-12-24 DIAGNOSIS — D631 Anemia in chronic kidney disease: Secondary | ICD-10-CM | POA: Diagnosis not present

## 2017-12-24 DIAGNOSIS — Z4932 Encounter for adequacy testing for peritoneal dialysis: Secondary | ICD-10-CM | POA: Diagnosis not present

## 2017-12-24 DIAGNOSIS — N186 End stage renal disease: Secondary | ICD-10-CM | POA: Diagnosis not present

## 2017-12-24 DIAGNOSIS — E440 Moderate protein-calorie malnutrition: Secondary | ICD-10-CM | POA: Diagnosis not present

## 2017-12-24 DIAGNOSIS — Z79899 Other long term (current) drug therapy: Secondary | ICD-10-CM | POA: Diagnosis not present

## 2017-12-24 DIAGNOSIS — R17 Unspecified jaundice: Secondary | ICD-10-CM | POA: Diagnosis not present

## 2017-12-25 DIAGNOSIS — Z79899 Other long term (current) drug therapy: Secondary | ICD-10-CM | POA: Diagnosis not present

## 2017-12-25 DIAGNOSIS — R17 Unspecified jaundice: Secondary | ICD-10-CM | POA: Diagnosis not present

## 2017-12-25 DIAGNOSIS — Z4932 Encounter for adequacy testing for peritoneal dialysis: Secondary | ICD-10-CM | POA: Diagnosis not present

## 2017-12-25 DIAGNOSIS — E440 Moderate protein-calorie malnutrition: Secondary | ICD-10-CM | POA: Diagnosis not present

## 2017-12-25 DIAGNOSIS — D631 Anemia in chronic kidney disease: Secondary | ICD-10-CM | POA: Diagnosis not present

## 2017-12-25 DIAGNOSIS — N186 End stage renal disease: Secondary | ICD-10-CM | POA: Diagnosis not present

## 2017-12-26 DIAGNOSIS — Z79899 Other long term (current) drug therapy: Secondary | ICD-10-CM | POA: Diagnosis not present

## 2017-12-26 DIAGNOSIS — Z4932 Encounter for adequacy testing for peritoneal dialysis: Secondary | ICD-10-CM | POA: Diagnosis not present

## 2017-12-26 DIAGNOSIS — E440 Moderate protein-calorie malnutrition: Secondary | ICD-10-CM | POA: Diagnosis not present

## 2017-12-26 DIAGNOSIS — N186 End stage renal disease: Secondary | ICD-10-CM | POA: Diagnosis not present

## 2017-12-26 DIAGNOSIS — D631 Anemia in chronic kidney disease: Secondary | ICD-10-CM | POA: Diagnosis not present

## 2017-12-26 DIAGNOSIS — R17 Unspecified jaundice: Secondary | ICD-10-CM | POA: Diagnosis not present

## 2017-12-26 DIAGNOSIS — R82994 Hypercalciuria: Secondary | ICD-10-CM | POA: Diagnosis not present

## 2017-12-27 DIAGNOSIS — Z79899 Other long term (current) drug therapy: Secondary | ICD-10-CM | POA: Diagnosis not present

## 2017-12-27 DIAGNOSIS — E440 Moderate protein-calorie malnutrition: Secondary | ICD-10-CM | POA: Diagnosis not present

## 2017-12-27 DIAGNOSIS — N186 End stage renal disease: Secondary | ICD-10-CM | POA: Diagnosis not present

## 2017-12-27 DIAGNOSIS — D631 Anemia in chronic kidney disease: Secondary | ICD-10-CM | POA: Diagnosis not present

## 2017-12-27 DIAGNOSIS — Z4932 Encounter for adequacy testing for peritoneal dialysis: Secondary | ICD-10-CM | POA: Diagnosis not present

## 2017-12-27 DIAGNOSIS — R17 Unspecified jaundice: Secondary | ICD-10-CM | POA: Diagnosis not present

## 2017-12-28 DIAGNOSIS — Z79899 Other long term (current) drug therapy: Secondary | ICD-10-CM | POA: Diagnosis not present

## 2017-12-28 DIAGNOSIS — D631 Anemia in chronic kidney disease: Secondary | ICD-10-CM | POA: Diagnosis not present

## 2017-12-28 DIAGNOSIS — Z4932 Encounter for adequacy testing for peritoneal dialysis: Secondary | ICD-10-CM | POA: Diagnosis not present

## 2017-12-28 DIAGNOSIS — E440 Moderate protein-calorie malnutrition: Secondary | ICD-10-CM | POA: Diagnosis not present

## 2017-12-28 DIAGNOSIS — R17 Unspecified jaundice: Secondary | ICD-10-CM | POA: Diagnosis not present

## 2017-12-28 DIAGNOSIS — N186 End stage renal disease: Secondary | ICD-10-CM | POA: Diagnosis not present

## 2017-12-29 DIAGNOSIS — E440 Moderate protein-calorie malnutrition: Secondary | ICD-10-CM | POA: Diagnosis not present

## 2017-12-29 DIAGNOSIS — Z4932 Encounter for adequacy testing for peritoneal dialysis: Secondary | ICD-10-CM | POA: Diagnosis not present

## 2017-12-29 DIAGNOSIS — R17 Unspecified jaundice: Secondary | ICD-10-CM | POA: Diagnosis not present

## 2017-12-29 DIAGNOSIS — Z79899 Other long term (current) drug therapy: Secondary | ICD-10-CM | POA: Diagnosis not present

## 2017-12-29 DIAGNOSIS — N186 End stage renal disease: Secondary | ICD-10-CM | POA: Diagnosis not present

## 2017-12-29 DIAGNOSIS — D631 Anemia in chronic kidney disease: Secondary | ICD-10-CM | POA: Diagnosis not present

## 2017-12-30 DIAGNOSIS — E440 Moderate protein-calorie malnutrition: Secondary | ICD-10-CM | POA: Diagnosis not present

## 2017-12-30 DIAGNOSIS — N186 End stage renal disease: Secondary | ICD-10-CM | POA: Diagnosis not present

## 2017-12-30 DIAGNOSIS — Z4932 Encounter for adequacy testing for peritoneal dialysis: Secondary | ICD-10-CM | POA: Diagnosis not present

## 2017-12-30 DIAGNOSIS — Z79899 Other long term (current) drug therapy: Secondary | ICD-10-CM | POA: Diagnosis not present

## 2017-12-30 DIAGNOSIS — R17 Unspecified jaundice: Secondary | ICD-10-CM | POA: Diagnosis not present

## 2017-12-30 DIAGNOSIS — D631 Anemia in chronic kidney disease: Secondary | ICD-10-CM | POA: Diagnosis not present

## 2017-12-31 DIAGNOSIS — D631 Anemia in chronic kidney disease: Secondary | ICD-10-CM | POA: Diagnosis not present

## 2017-12-31 DIAGNOSIS — R17 Unspecified jaundice: Secondary | ICD-10-CM | POA: Diagnosis not present

## 2017-12-31 DIAGNOSIS — Z4932 Encounter for adequacy testing for peritoneal dialysis: Secondary | ICD-10-CM | POA: Diagnosis not present

## 2017-12-31 DIAGNOSIS — N186 End stage renal disease: Secondary | ICD-10-CM | POA: Diagnosis not present

## 2017-12-31 DIAGNOSIS — E440 Moderate protein-calorie malnutrition: Secondary | ICD-10-CM | POA: Diagnosis not present

## 2017-12-31 DIAGNOSIS — Z79899 Other long term (current) drug therapy: Secondary | ICD-10-CM | POA: Diagnosis not present

## 2018-01-01 DIAGNOSIS — N186 End stage renal disease: Secondary | ICD-10-CM | POA: Diagnosis not present

## 2018-01-01 DIAGNOSIS — R17 Unspecified jaundice: Secondary | ICD-10-CM | POA: Diagnosis not present

## 2018-01-01 DIAGNOSIS — Z79899 Other long term (current) drug therapy: Secondary | ICD-10-CM | POA: Diagnosis not present

## 2018-01-01 DIAGNOSIS — E440 Moderate protein-calorie malnutrition: Secondary | ICD-10-CM | POA: Diagnosis not present

## 2018-01-01 DIAGNOSIS — Z4932 Encounter for adequacy testing for peritoneal dialysis: Secondary | ICD-10-CM | POA: Diagnosis not present

## 2018-01-01 DIAGNOSIS — D631 Anemia in chronic kidney disease: Secondary | ICD-10-CM | POA: Diagnosis not present

## 2018-01-02 DIAGNOSIS — Z4932 Encounter for adequacy testing for peritoneal dialysis: Secondary | ICD-10-CM | POA: Diagnosis not present

## 2018-01-02 DIAGNOSIS — N186 End stage renal disease: Secondary | ICD-10-CM | POA: Diagnosis not present

## 2018-01-02 DIAGNOSIS — J0110 Acute frontal sinusitis, unspecified: Secondary | ICD-10-CM | POA: Diagnosis not present

## 2018-01-02 DIAGNOSIS — D631 Anemia in chronic kidney disease: Secondary | ICD-10-CM | POA: Diagnosis not present

## 2018-01-02 DIAGNOSIS — R17 Unspecified jaundice: Secondary | ICD-10-CM | POA: Diagnosis not present

## 2018-01-02 DIAGNOSIS — Z79899 Other long term (current) drug therapy: Secondary | ICD-10-CM | POA: Diagnosis not present

## 2018-01-02 DIAGNOSIS — E440 Moderate protein-calorie malnutrition: Secondary | ICD-10-CM | POA: Diagnosis not present

## 2018-01-03 DIAGNOSIS — D631 Anemia in chronic kidney disease: Secondary | ICD-10-CM | POA: Diagnosis not present

## 2018-01-03 DIAGNOSIS — R17 Unspecified jaundice: Secondary | ICD-10-CM | POA: Diagnosis not present

## 2018-01-03 DIAGNOSIS — E440 Moderate protein-calorie malnutrition: Secondary | ICD-10-CM | POA: Diagnosis not present

## 2018-01-03 DIAGNOSIS — Z79899 Other long term (current) drug therapy: Secondary | ICD-10-CM | POA: Diagnosis not present

## 2018-01-03 DIAGNOSIS — N186 End stage renal disease: Secondary | ICD-10-CM | POA: Diagnosis not present

## 2018-01-03 DIAGNOSIS — Z4932 Encounter for adequacy testing for peritoneal dialysis: Secondary | ICD-10-CM | POA: Diagnosis not present

## 2018-01-04 DIAGNOSIS — D631 Anemia in chronic kidney disease: Secondary | ICD-10-CM | POA: Diagnosis not present

## 2018-01-04 DIAGNOSIS — R17 Unspecified jaundice: Secondary | ICD-10-CM | POA: Diagnosis not present

## 2018-01-04 DIAGNOSIS — Z79899 Other long term (current) drug therapy: Secondary | ICD-10-CM | POA: Diagnosis not present

## 2018-01-04 DIAGNOSIS — E440 Moderate protein-calorie malnutrition: Secondary | ICD-10-CM | POA: Diagnosis not present

## 2018-01-04 DIAGNOSIS — N186 End stage renal disease: Secondary | ICD-10-CM | POA: Diagnosis not present

## 2018-01-04 DIAGNOSIS — Z4932 Encounter for adequacy testing for peritoneal dialysis: Secondary | ICD-10-CM | POA: Diagnosis not present

## 2018-01-05 DIAGNOSIS — Z79899 Other long term (current) drug therapy: Secondary | ICD-10-CM | POA: Diagnosis not present

## 2018-01-05 DIAGNOSIS — D631 Anemia in chronic kidney disease: Secondary | ICD-10-CM | POA: Diagnosis not present

## 2018-01-05 DIAGNOSIS — N186 End stage renal disease: Secondary | ICD-10-CM | POA: Diagnosis not present

## 2018-01-05 DIAGNOSIS — Z4932 Encounter for adequacy testing for peritoneal dialysis: Secondary | ICD-10-CM | POA: Diagnosis not present

## 2018-01-05 DIAGNOSIS — R17 Unspecified jaundice: Secondary | ICD-10-CM | POA: Diagnosis not present

## 2018-01-05 DIAGNOSIS — E440 Moderate protein-calorie malnutrition: Secondary | ICD-10-CM | POA: Diagnosis not present

## 2018-01-06 ENCOUNTER — Ambulatory Visit: Payer: Medicare Other | Admitting: Adult Health

## 2018-01-06 DIAGNOSIS — Z4932 Encounter for adequacy testing for peritoneal dialysis: Secondary | ICD-10-CM | POA: Diagnosis not present

## 2018-01-06 DIAGNOSIS — Z79899 Other long term (current) drug therapy: Secondary | ICD-10-CM | POA: Diagnosis not present

## 2018-01-06 DIAGNOSIS — R17 Unspecified jaundice: Secondary | ICD-10-CM | POA: Diagnosis not present

## 2018-01-06 DIAGNOSIS — D631 Anemia in chronic kidney disease: Secondary | ICD-10-CM | POA: Diagnosis not present

## 2018-01-06 DIAGNOSIS — E440 Moderate protein-calorie malnutrition: Secondary | ICD-10-CM | POA: Diagnosis not present

## 2018-01-06 DIAGNOSIS — N186 End stage renal disease: Secondary | ICD-10-CM | POA: Diagnosis not present

## 2018-01-07 DIAGNOSIS — M5090 Cervical disc disorder, unspecified, unspecified cervical region: Secondary | ICD-10-CM | POA: Diagnosis not present

## 2018-01-07 DIAGNOSIS — R05 Cough: Secondary | ICD-10-CM | POA: Diagnosis not present

## 2018-01-07 DIAGNOSIS — I10 Essential (primary) hypertension: Secondary | ICD-10-CM | POA: Diagnosis not present

## 2018-01-07 DIAGNOSIS — Z992 Dependence on renal dialysis: Secondary | ICD-10-CM | POA: Diagnosis not present

## 2018-01-07 DIAGNOSIS — Z79899 Other long term (current) drug therapy: Secondary | ICD-10-CM | POA: Diagnosis not present

## 2018-01-07 DIAGNOSIS — R17 Unspecified jaundice: Secondary | ICD-10-CM | POA: Diagnosis not present

## 2018-01-07 DIAGNOSIS — D631 Anemia in chronic kidney disease: Secondary | ICD-10-CM | POA: Diagnosis not present

## 2018-01-07 DIAGNOSIS — Z794 Long term (current) use of insulin: Secondary | ICD-10-CM | POA: Diagnosis not present

## 2018-01-07 DIAGNOSIS — Z4932 Encounter for adequacy testing for peritoneal dialysis: Secondary | ICD-10-CM | POA: Diagnosis not present

## 2018-01-07 DIAGNOSIS — E440 Moderate protein-calorie malnutrition: Secondary | ICD-10-CM | POA: Diagnosis not present

## 2018-01-07 DIAGNOSIS — B182 Chronic viral hepatitis C: Secondary | ICD-10-CM | POA: Diagnosis not present

## 2018-01-07 DIAGNOSIS — E1122 Type 2 diabetes mellitus with diabetic chronic kidney disease: Secondary | ICD-10-CM | POA: Diagnosis not present

## 2018-01-07 DIAGNOSIS — F322 Major depressive disorder, single episode, severe without psychotic features: Secondary | ICD-10-CM | POA: Diagnosis not present

## 2018-01-07 DIAGNOSIS — N186 End stage renal disease: Secondary | ICD-10-CM | POA: Diagnosis not present

## 2018-01-07 DIAGNOSIS — E119 Type 2 diabetes mellitus without complications: Secondary | ICD-10-CM | POA: Diagnosis not present

## 2018-01-08 DIAGNOSIS — N186 End stage renal disease: Secondary | ICD-10-CM | POA: Diagnosis not present

## 2018-01-08 DIAGNOSIS — Z4932 Encounter for adequacy testing for peritoneal dialysis: Secondary | ICD-10-CM | POA: Diagnosis not present

## 2018-01-08 DIAGNOSIS — E440 Moderate protein-calorie malnutrition: Secondary | ICD-10-CM | POA: Diagnosis not present

## 2018-01-08 DIAGNOSIS — R17 Unspecified jaundice: Secondary | ICD-10-CM | POA: Diagnosis not present

## 2018-01-08 DIAGNOSIS — D631 Anemia in chronic kidney disease: Secondary | ICD-10-CM | POA: Diagnosis not present

## 2018-01-08 DIAGNOSIS — Z79899 Other long term (current) drug therapy: Secondary | ICD-10-CM | POA: Diagnosis not present

## 2018-01-09 DIAGNOSIS — Z4932 Encounter for adequacy testing for peritoneal dialysis: Secondary | ICD-10-CM | POA: Diagnosis not present

## 2018-01-09 DIAGNOSIS — E440 Moderate protein-calorie malnutrition: Secondary | ICD-10-CM | POA: Diagnosis not present

## 2018-01-09 DIAGNOSIS — D631 Anemia in chronic kidney disease: Secondary | ICD-10-CM | POA: Diagnosis not present

## 2018-01-09 DIAGNOSIS — N186 End stage renal disease: Secondary | ICD-10-CM | POA: Diagnosis not present

## 2018-01-09 DIAGNOSIS — R17 Unspecified jaundice: Secondary | ICD-10-CM | POA: Diagnosis not present

## 2018-01-09 DIAGNOSIS — Z79899 Other long term (current) drug therapy: Secondary | ICD-10-CM | POA: Diagnosis not present

## 2018-01-10 DIAGNOSIS — R17 Unspecified jaundice: Secondary | ICD-10-CM | POA: Diagnosis not present

## 2018-01-10 DIAGNOSIS — Z79899 Other long term (current) drug therapy: Secondary | ICD-10-CM | POA: Diagnosis not present

## 2018-01-10 DIAGNOSIS — D631 Anemia in chronic kidney disease: Secondary | ICD-10-CM | POA: Diagnosis not present

## 2018-01-10 DIAGNOSIS — Z4932 Encounter for adequacy testing for peritoneal dialysis: Secondary | ICD-10-CM | POA: Diagnosis not present

## 2018-01-10 DIAGNOSIS — N186 End stage renal disease: Secondary | ICD-10-CM | POA: Diagnosis not present

## 2018-01-10 DIAGNOSIS — E440 Moderate protein-calorie malnutrition: Secondary | ICD-10-CM | POA: Diagnosis not present

## 2018-01-11 DIAGNOSIS — D631 Anemia in chronic kidney disease: Secondary | ICD-10-CM | POA: Diagnosis not present

## 2018-01-11 DIAGNOSIS — E440 Moderate protein-calorie malnutrition: Secondary | ICD-10-CM | POA: Diagnosis not present

## 2018-01-11 DIAGNOSIS — Z79899 Other long term (current) drug therapy: Secondary | ICD-10-CM | POA: Diagnosis not present

## 2018-01-11 DIAGNOSIS — Z4932 Encounter for adequacy testing for peritoneal dialysis: Secondary | ICD-10-CM | POA: Diagnosis not present

## 2018-01-11 DIAGNOSIS — N186 End stage renal disease: Secondary | ICD-10-CM | POA: Diagnosis not present

## 2018-01-11 DIAGNOSIS — R17 Unspecified jaundice: Secondary | ICD-10-CM | POA: Diagnosis not present

## 2018-01-12 DIAGNOSIS — Z4932 Encounter for adequacy testing for peritoneal dialysis: Secondary | ICD-10-CM | POA: Diagnosis not present

## 2018-01-12 DIAGNOSIS — N186 End stage renal disease: Secondary | ICD-10-CM | POA: Diagnosis not present

## 2018-01-12 DIAGNOSIS — R17 Unspecified jaundice: Secondary | ICD-10-CM | POA: Diagnosis not present

## 2018-01-12 DIAGNOSIS — E440 Moderate protein-calorie malnutrition: Secondary | ICD-10-CM | POA: Diagnosis not present

## 2018-01-12 DIAGNOSIS — D631 Anemia in chronic kidney disease: Secondary | ICD-10-CM | POA: Diagnosis not present

## 2018-01-12 DIAGNOSIS — Z79899 Other long term (current) drug therapy: Secondary | ICD-10-CM | POA: Diagnosis not present

## 2018-01-13 DIAGNOSIS — E440 Moderate protein-calorie malnutrition: Secondary | ICD-10-CM | POA: Diagnosis not present

## 2018-01-13 DIAGNOSIS — R17 Unspecified jaundice: Secondary | ICD-10-CM | POA: Diagnosis not present

## 2018-01-13 DIAGNOSIS — D631 Anemia in chronic kidney disease: Secondary | ICD-10-CM | POA: Diagnosis not present

## 2018-01-13 DIAGNOSIS — Z79899 Other long term (current) drug therapy: Secondary | ICD-10-CM | POA: Diagnosis not present

## 2018-01-13 DIAGNOSIS — Z4932 Encounter for adequacy testing for peritoneal dialysis: Secondary | ICD-10-CM | POA: Diagnosis not present

## 2018-01-13 DIAGNOSIS — N186 End stage renal disease: Secondary | ICD-10-CM | POA: Diagnosis not present

## 2018-01-14 DIAGNOSIS — N186 End stage renal disease: Secondary | ICD-10-CM | POA: Diagnosis not present

## 2018-01-14 DIAGNOSIS — D631 Anemia in chronic kidney disease: Secondary | ICD-10-CM | POA: Diagnosis not present

## 2018-01-14 DIAGNOSIS — Z79899 Other long term (current) drug therapy: Secondary | ICD-10-CM | POA: Diagnosis not present

## 2018-01-14 DIAGNOSIS — R17 Unspecified jaundice: Secondary | ICD-10-CM | POA: Diagnosis not present

## 2018-01-14 DIAGNOSIS — E440 Moderate protein-calorie malnutrition: Secondary | ICD-10-CM | POA: Diagnosis not present

## 2018-01-14 DIAGNOSIS — Z4932 Encounter for adequacy testing for peritoneal dialysis: Secondary | ICD-10-CM | POA: Diagnosis not present

## 2018-01-15 DIAGNOSIS — Z79899 Other long term (current) drug therapy: Secondary | ICD-10-CM | POA: Diagnosis not present

## 2018-01-15 DIAGNOSIS — E440 Moderate protein-calorie malnutrition: Secondary | ICD-10-CM | POA: Diagnosis not present

## 2018-01-15 DIAGNOSIS — Z4932 Encounter for adequacy testing for peritoneal dialysis: Secondary | ICD-10-CM | POA: Diagnosis not present

## 2018-01-15 DIAGNOSIS — D631 Anemia in chronic kidney disease: Secondary | ICD-10-CM | POA: Diagnosis not present

## 2018-01-15 DIAGNOSIS — R17 Unspecified jaundice: Secondary | ICD-10-CM | POA: Diagnosis not present

## 2018-01-15 DIAGNOSIS — N186 End stage renal disease: Secondary | ICD-10-CM | POA: Diagnosis not present

## 2018-01-16 DIAGNOSIS — Z79899 Other long term (current) drug therapy: Secondary | ICD-10-CM | POA: Diagnosis not present

## 2018-01-16 DIAGNOSIS — R17 Unspecified jaundice: Secondary | ICD-10-CM | POA: Diagnosis not present

## 2018-01-16 DIAGNOSIS — E440 Moderate protein-calorie malnutrition: Secondary | ICD-10-CM | POA: Diagnosis not present

## 2018-01-16 DIAGNOSIS — N186 End stage renal disease: Secondary | ICD-10-CM | POA: Diagnosis not present

## 2018-01-16 DIAGNOSIS — Z4932 Encounter for adequacy testing for peritoneal dialysis: Secondary | ICD-10-CM | POA: Diagnosis not present

## 2018-01-16 DIAGNOSIS — D631 Anemia in chronic kidney disease: Secondary | ICD-10-CM | POA: Diagnosis not present

## 2018-01-17 DIAGNOSIS — E440 Moderate protein-calorie malnutrition: Secondary | ICD-10-CM | POA: Diagnosis not present

## 2018-01-17 DIAGNOSIS — Z79899 Other long term (current) drug therapy: Secondary | ICD-10-CM | POA: Diagnosis not present

## 2018-01-17 DIAGNOSIS — Z4932 Encounter for adequacy testing for peritoneal dialysis: Secondary | ICD-10-CM | POA: Diagnosis not present

## 2018-01-17 DIAGNOSIS — D631 Anemia in chronic kidney disease: Secondary | ICD-10-CM | POA: Diagnosis not present

## 2018-01-17 DIAGNOSIS — R17 Unspecified jaundice: Secondary | ICD-10-CM | POA: Diagnosis not present

## 2018-01-17 DIAGNOSIS — N186 End stage renal disease: Secondary | ICD-10-CM | POA: Diagnosis not present

## 2018-01-18 DIAGNOSIS — Z4932 Encounter for adequacy testing for peritoneal dialysis: Secondary | ICD-10-CM | POA: Diagnosis not present

## 2018-01-18 DIAGNOSIS — D631 Anemia in chronic kidney disease: Secondary | ICD-10-CM | POA: Diagnosis not present

## 2018-01-18 DIAGNOSIS — Z79899 Other long term (current) drug therapy: Secondary | ICD-10-CM | POA: Diagnosis not present

## 2018-01-18 DIAGNOSIS — R17 Unspecified jaundice: Secondary | ICD-10-CM | POA: Diagnosis not present

## 2018-01-18 DIAGNOSIS — N186 End stage renal disease: Secondary | ICD-10-CM | POA: Diagnosis not present

## 2018-01-18 DIAGNOSIS — E440 Moderate protein-calorie malnutrition: Secondary | ICD-10-CM | POA: Diagnosis not present

## 2018-01-19 DIAGNOSIS — E440 Moderate protein-calorie malnutrition: Secondary | ICD-10-CM | POA: Diagnosis not present

## 2018-01-19 DIAGNOSIS — R17 Unspecified jaundice: Secondary | ICD-10-CM | POA: Diagnosis not present

## 2018-01-19 DIAGNOSIS — D631 Anemia in chronic kidney disease: Secondary | ICD-10-CM | POA: Diagnosis not present

## 2018-01-19 DIAGNOSIS — Z79899 Other long term (current) drug therapy: Secondary | ICD-10-CM | POA: Diagnosis not present

## 2018-01-19 DIAGNOSIS — N186 End stage renal disease: Secondary | ICD-10-CM | POA: Diagnosis not present

## 2018-01-19 DIAGNOSIS — Z4932 Encounter for adequacy testing for peritoneal dialysis: Secondary | ICD-10-CM | POA: Diagnosis not present

## 2018-01-20 DIAGNOSIS — E7849 Other hyperlipidemia: Secondary | ICD-10-CM | POA: Diagnosis not present

## 2018-01-20 DIAGNOSIS — N2581 Secondary hyperparathyroidism of renal origin: Secondary | ICD-10-CM | POA: Diagnosis not present

## 2018-01-20 DIAGNOSIS — Z4932 Encounter for adequacy testing for peritoneal dialysis: Secondary | ICD-10-CM | POA: Diagnosis not present

## 2018-01-20 DIAGNOSIS — D631 Anemia in chronic kidney disease: Secondary | ICD-10-CM | POA: Diagnosis not present

## 2018-01-20 DIAGNOSIS — N186 End stage renal disease: Secondary | ICD-10-CM | POA: Diagnosis not present

## 2018-01-20 DIAGNOSIS — Z992 Dependence on renal dialysis: Secondary | ICD-10-CM | POA: Diagnosis not present

## 2018-01-20 DIAGNOSIS — I129 Hypertensive chronic kidney disease with stage 1 through stage 4 chronic kidney disease, or unspecified chronic kidney disease: Secondary | ICD-10-CM | POA: Diagnosis not present

## 2018-01-20 DIAGNOSIS — D509 Iron deficiency anemia, unspecified: Secondary | ICD-10-CM | POA: Diagnosis not present

## 2018-01-20 DIAGNOSIS — E1129 Type 2 diabetes mellitus with other diabetic kidney complication: Secondary | ICD-10-CM | POA: Diagnosis not present

## 2018-01-20 DIAGNOSIS — N2589 Other disorders resulting from impaired renal tubular function: Secondary | ICD-10-CM | POA: Diagnosis not present

## 2018-01-20 DIAGNOSIS — R82994 Hypercalciuria: Secondary | ICD-10-CM | POA: Diagnosis not present

## 2018-01-21 DIAGNOSIS — N186 End stage renal disease: Secondary | ICD-10-CM | POA: Diagnosis not present

## 2018-01-21 DIAGNOSIS — D509 Iron deficiency anemia, unspecified: Secondary | ICD-10-CM | POA: Diagnosis not present

## 2018-01-21 DIAGNOSIS — N2581 Secondary hyperparathyroidism of renal origin: Secondary | ICD-10-CM | POA: Diagnosis not present

## 2018-01-21 DIAGNOSIS — D631 Anemia in chronic kidney disease: Secondary | ICD-10-CM | POA: Diagnosis not present

## 2018-01-21 DIAGNOSIS — N2589 Other disorders resulting from impaired renal tubular function: Secondary | ICD-10-CM | POA: Diagnosis not present

## 2018-01-21 DIAGNOSIS — Z4932 Encounter for adequacy testing for peritoneal dialysis: Secondary | ICD-10-CM | POA: Diagnosis not present

## 2018-01-22 DIAGNOSIS — N2581 Secondary hyperparathyroidism of renal origin: Secondary | ICD-10-CM | POA: Diagnosis not present

## 2018-01-22 DIAGNOSIS — N2589 Other disorders resulting from impaired renal tubular function: Secondary | ICD-10-CM | POA: Diagnosis not present

## 2018-01-22 DIAGNOSIS — D631 Anemia in chronic kidney disease: Secondary | ICD-10-CM | POA: Diagnosis not present

## 2018-01-22 DIAGNOSIS — N186 End stage renal disease: Secondary | ICD-10-CM | POA: Diagnosis not present

## 2018-01-22 DIAGNOSIS — D509 Iron deficiency anemia, unspecified: Secondary | ICD-10-CM | POA: Diagnosis not present

## 2018-01-22 DIAGNOSIS — Z4932 Encounter for adequacy testing for peritoneal dialysis: Secondary | ICD-10-CM | POA: Diagnosis not present

## 2018-01-23 DIAGNOSIS — Z4932 Encounter for adequacy testing for peritoneal dialysis: Secondary | ICD-10-CM | POA: Diagnosis not present

## 2018-01-23 DIAGNOSIS — N2589 Other disorders resulting from impaired renal tubular function: Secondary | ICD-10-CM | POA: Diagnosis not present

## 2018-01-23 DIAGNOSIS — D509 Iron deficiency anemia, unspecified: Secondary | ICD-10-CM | POA: Diagnosis not present

## 2018-01-23 DIAGNOSIS — N186 End stage renal disease: Secondary | ICD-10-CM | POA: Diagnosis not present

## 2018-01-23 DIAGNOSIS — N2581 Secondary hyperparathyroidism of renal origin: Secondary | ICD-10-CM | POA: Diagnosis not present

## 2018-01-23 DIAGNOSIS — D631 Anemia in chronic kidney disease: Secondary | ICD-10-CM | POA: Diagnosis not present

## 2018-01-24 DIAGNOSIS — D631 Anemia in chronic kidney disease: Secondary | ICD-10-CM | POA: Diagnosis not present

## 2018-01-24 DIAGNOSIS — N186 End stage renal disease: Secondary | ICD-10-CM | POA: Diagnosis not present

## 2018-01-24 DIAGNOSIS — Z4932 Encounter for adequacy testing for peritoneal dialysis: Secondary | ICD-10-CM | POA: Diagnosis not present

## 2018-01-24 DIAGNOSIS — N2589 Other disorders resulting from impaired renal tubular function: Secondary | ICD-10-CM | POA: Diagnosis not present

## 2018-01-24 DIAGNOSIS — N2581 Secondary hyperparathyroidism of renal origin: Secondary | ICD-10-CM | POA: Diagnosis not present

## 2018-01-24 DIAGNOSIS — D509 Iron deficiency anemia, unspecified: Secondary | ICD-10-CM | POA: Diagnosis not present

## 2018-01-25 DIAGNOSIS — Z4932 Encounter for adequacy testing for peritoneal dialysis: Secondary | ICD-10-CM | POA: Diagnosis not present

## 2018-01-25 DIAGNOSIS — N2589 Other disorders resulting from impaired renal tubular function: Secondary | ICD-10-CM | POA: Diagnosis not present

## 2018-01-25 DIAGNOSIS — D509 Iron deficiency anemia, unspecified: Secondary | ICD-10-CM | POA: Diagnosis not present

## 2018-01-25 DIAGNOSIS — N2581 Secondary hyperparathyroidism of renal origin: Secondary | ICD-10-CM | POA: Diagnosis not present

## 2018-01-25 DIAGNOSIS — D631 Anemia in chronic kidney disease: Secondary | ICD-10-CM | POA: Diagnosis not present

## 2018-01-25 DIAGNOSIS — N186 End stage renal disease: Secondary | ICD-10-CM | POA: Diagnosis not present

## 2018-01-26 DIAGNOSIS — D509 Iron deficiency anemia, unspecified: Secondary | ICD-10-CM | POA: Diagnosis not present

## 2018-01-26 DIAGNOSIS — Z4932 Encounter for adequacy testing for peritoneal dialysis: Secondary | ICD-10-CM | POA: Diagnosis not present

## 2018-01-26 DIAGNOSIS — D631 Anemia in chronic kidney disease: Secondary | ICD-10-CM | POA: Diagnosis not present

## 2018-01-26 DIAGNOSIS — N2589 Other disorders resulting from impaired renal tubular function: Secondary | ICD-10-CM | POA: Diagnosis not present

## 2018-01-26 DIAGNOSIS — N2581 Secondary hyperparathyroidism of renal origin: Secondary | ICD-10-CM | POA: Diagnosis not present

## 2018-01-26 DIAGNOSIS — N186 End stage renal disease: Secondary | ICD-10-CM | POA: Diagnosis not present

## 2018-01-27 DIAGNOSIS — D631 Anemia in chronic kidney disease: Secondary | ICD-10-CM | POA: Diagnosis not present

## 2018-01-27 DIAGNOSIS — N186 End stage renal disease: Secondary | ICD-10-CM | POA: Diagnosis not present

## 2018-01-27 DIAGNOSIS — N2589 Other disorders resulting from impaired renal tubular function: Secondary | ICD-10-CM | POA: Diagnosis not present

## 2018-01-27 DIAGNOSIS — N2581 Secondary hyperparathyroidism of renal origin: Secondary | ICD-10-CM | POA: Diagnosis not present

## 2018-01-27 DIAGNOSIS — Z4932 Encounter for adequacy testing for peritoneal dialysis: Secondary | ICD-10-CM | POA: Diagnosis not present

## 2018-01-27 DIAGNOSIS — D509 Iron deficiency anemia, unspecified: Secondary | ICD-10-CM | POA: Diagnosis not present

## 2018-01-27 NOTE — Progress Notes (Signed)
Cardiology Office Note   Date:  01/28/2018   ID:  Maureen Barnes, DOB Feb 09, 1961, MRN [id number]  PCP:  Wenda Low, MD  Cardiologist:  [facility]  Chief Complaint  Patient presents with  . Follow-up     History of Present Illness: Maureen Barnes is a 57 y.o. female who presents for ongoing assessment and management of symptomatic palpitations. Other history includes HTN, hyperlipidemia,diabetes, renal insufficiency, with anticipation of needing AV fistula, and Hep C. When last seen by Dr. Gwenlyn Found, 12/04/2017, she had stopped taking coreg as this was causing hypotension and did not stop palpitations. Carvedilol was changed to atenolol 25 mg daily.   The patient speaks at length concerning all of her different physicians and medication manipulations for hypertension She is seeing 3 different physicians and a pharmacist for various complaints and medication management. and is also followed by [facility] for kidney transplant She has not taken atenolol as she was advised against this by other physicians. She has had coreg dose changed as well. She offers no further complaints of palpitations.   Past Medical History:  Diagnosis Date  . Anemia   . Diabetes mellitus   . Hep C w/o coma, chronic ([place])   . Hypertension     Past Surgical History:  Procedure Laterality Date  . TUBAL LIGATION       Current Outpatient Medications  Medication Sig Dispense Refill  . amLODipine (NORVASC) 10 MG tablet Take 10 mg by mouth daily.      Marland Kitchen aspirin EC 81 MG tablet Take 81 mg by mouth daily.    Marland Kitchen atorvastatin (LIPITOR) 40 MG tablet Take 40 mg by mouth daily.    Marland Kitchen b complex-vitamin c-folic acid (NEPHRO-VITE) 0.8 MG TABS tablet Take 1 tablet by mouth daily.  6  . calcitRIOL (ROCALTROL) 0.25 MCG capsule Take 0.25 mcg by mouth daily. Take 2 caps daily    . carvedilol (COREG) 12.5 MG tablet Take 12.5 mg by mouth 2 (two) times daily with a meal.    . cyclobenzaprine (FLEXERIL) 10 MG tablet Take  1 tablet by mouth as directed.    . diphenhydrAMINE (BENADRYL) 25 MG tablet Take 1 tablet (25 mg total) by mouth at bedtime as needed. 30 tablet 0  . docusate sodium (COLACE) 100 MG capsule Take 100 mg by mouth 2 (two) times daily.    Marland Kitchen gentamicin cream (GARAMYCIN) 0.1 % APPLY SMALL AMOUNT TO EXIT SITE DAILY  3  . Insulin Glargine (LANTUS SOLOSTAR [place]) Inject 26 Units into the skin daily.    Marland Kitchen lactulose, encephalopathy, (CHRONULAC) 10 GM/15ML SOLN Take 10 g by mouth.    . loratadine (CLARITIN) 10 MG tablet Take 10 mg by mouth as directed.    Marland Kitchen losartan-hydrochlorothiazide (HYZAAR) 100-25 MG tablet Take 1 tablet by mouth daily.    . ranitidine (ZANTAC) 150 MG capsule Take 150 mg by mouth every evening.     No current facility-administered medications for this visit.     Allergies:   Peanut oil; Shellfish-derived products; Iodine; Peanut-containing drug products; and Shellfish allergy    Social History:  The patient  reports that she has never smoked. She has never used smokeless tobacco. She reports that she does not drink alcohol or use drugs.   Family History:  The patient's family history includes Cancer in her mother; Diabetes in her father; Hypertension in her father and mother.    ROS: All other systems are reviewed and negative. Unless otherwise mentioned in H&P  PHYSICAL EXAM: VS:  BP 131/89   Pulse 74   Ht 5\' 3"  (1.6 m)   Wt 167 lb 1.6 oz (75.8 kg)   LMP 07/10/2011   BMI 29.60 kg/m  , BMI Body mass index is 29.6 kg/m. GEN: Well nourished, well developed, in no acute distress  HEENT: normal  Neck: no JVD, carotid bruits, or masses Cardiac: RRR; no murmurs, rubs, or gallops,no edema  Respiratory:  clear to auscultation bilaterally, normal work of breathing GI: soft, nontender, nondistended, + BS MS: no deformity or atrophy  Skin: warm and dry, no rash Neuro:  Strength and sensation are intact Psych: euthymic mood, full affect   EKG:  NSR reate of 82 bpm. T-wave  flattening V4-V6.  Recent Labs: 07/10/2017: ALT 23; BUN 34; Creatinine, Ser 3.66; Hemoglobin 10.8; Platelets 299; Potassium 3.3; Sodium 141    Lipid Panel No results found for: CHOL, TRIG, HDL, CHOLHDL, VLDL, LDLCALC, LDLDIRECT    Wt Readings from Last 3 Encounters:  01/28/18 167 lb 1.6 oz (75.8 kg)  12/04/17 165 lb (74.8 kg)  04/24/17 169 lb (76.7 kg)      Other studies Reviewed: Echocardiogram  02/04/2017 Left ventricle: The cavity size was normal. Wall thickness was   increased in a pattern of mild LVH. Systolic function was normal.   The estimated ejection fraction was in the range of 60% to 65%.   Wall motion was normal; there were no regional wall motion   abnormalities. Features are consistent with a pseudonormal left   ventricular filling pattern, with concomitant abnormal relaxation   and increased filling pressure (grade 2 diastolic dysfunction). - Mitral valve: There was mild regurgitation.  ASSESSMENT AND PLAN:  1. Palpitations: No further complaints. Has not taken atenolol.   2. Hypertension: Followed by multiple physicians and a pharmacist for control. She discusses this at length. I have recommended that she choose one healthcare provider to manage her BP to avoid confusion and multiple medication manipulations. She is choosing to follow her PCP and pharmacist who is within that PCP practice. She will see cardiology prn.   Current medicines are reviewed at length with the patient today.  I have spend >35 minutes with this patient   Labs/ tests ordered today include: None  Phill Myron. West Pugh, ANP, AACC   01/28/2018 12:00 PM    [facility] [address] [place], [place] [address] Phone: [phone]; Fax: [phone]

## 2018-01-28 ENCOUNTER — Encounter: Payer: Self-pay | Admitting: Adult Health

## 2018-01-28 ENCOUNTER — Ambulatory Visit (INDEPENDENT_AMBULATORY_CARE_PROVIDER_SITE_OTHER): Payer: Medicare Other | Admitting: Adult Health

## 2018-01-28 VITALS — BP 131/89 | HR 74 | Ht 63.0 in | Wt 167.1 lb

## 2018-01-28 DIAGNOSIS — D649 Anemia, unspecified: Secondary | ICD-10-CM | POA: Diagnosis not present

## 2018-01-28 DIAGNOSIS — E782 Mixed hyperlipidemia: Secondary | ICD-10-CM | POA: Diagnosis not present

## 2018-01-28 DIAGNOSIS — D509 Iron deficiency anemia, unspecified: Secondary | ICD-10-CM | POA: Diagnosis not present

## 2018-01-28 DIAGNOSIS — N186 End stage renal disease: Secondary | ICD-10-CM | POA: Diagnosis not present

## 2018-01-28 DIAGNOSIS — E1122 Type 2 diabetes mellitus with diabetic chronic kidney disease: Secondary | ICD-10-CM | POA: Diagnosis not present

## 2018-01-28 DIAGNOSIS — I10 Essential (primary) hypertension: Secondary | ICD-10-CM

## 2018-01-28 DIAGNOSIS — Z4932 Encounter for adequacy testing for peritoneal dialysis: Secondary | ICD-10-CM | POA: Diagnosis not present

## 2018-01-28 DIAGNOSIS — D631 Anemia in chronic kidney disease: Secondary | ICD-10-CM | POA: Diagnosis not present

## 2018-01-28 DIAGNOSIS — R002 Palpitations: Secondary | ICD-10-CM

## 2018-01-28 DIAGNOSIS — N2589 Other disorders resulting from impaired renal tubular function: Secondary | ICD-10-CM | POA: Diagnosis not present

## 2018-01-28 DIAGNOSIS — F322 Major depressive disorder, single episode, severe without psychotic features: Secondary | ICD-10-CM | POA: Diagnosis not present

## 2018-01-28 DIAGNOSIS — Z794 Long term (current) use of insulin: Secondary | ICD-10-CM | POA: Diagnosis not present

## 2018-01-28 DIAGNOSIS — N2581 Secondary hyperparathyroidism of renal origin: Secondary | ICD-10-CM | POA: Diagnosis not present

## 2018-01-28 NOTE — Patient Instructions (Signed)
Medication Instructions:  NO CHANGES- Your physician recommends that you continue on your current medications as directed. Please refer to the Current Medication list given to you today.  If you need a refill on your cardiac medications before your next appointment, please call your pharmacy.  Follow-Up: Your physician wants you to follow-up in: AS NEEDED WITH DR BERRY.  Thank you for choosing [facility] at [facility]!!

## 2018-01-29 DIAGNOSIS — N2581 Secondary hyperparathyroidism of renal origin: Secondary | ICD-10-CM | POA: Diagnosis not present

## 2018-01-29 DIAGNOSIS — N2589 Other disorders resulting from impaired renal tubular function: Secondary | ICD-10-CM | POA: Diagnosis not present

## 2018-01-29 DIAGNOSIS — D631 Anemia in chronic kidney disease: Secondary | ICD-10-CM | POA: Diagnosis not present

## 2018-01-29 DIAGNOSIS — D509 Iron deficiency anemia, unspecified: Secondary | ICD-10-CM | POA: Diagnosis not present

## 2018-01-29 DIAGNOSIS — Z4932 Encounter for adequacy testing for peritoneal dialysis: Secondary | ICD-10-CM | POA: Diagnosis not present

## 2018-01-29 DIAGNOSIS — N186 End stage renal disease: Secondary | ICD-10-CM | POA: Diagnosis not present

## 2018-01-30 DIAGNOSIS — D509 Iron deficiency anemia, unspecified: Secondary | ICD-10-CM | POA: Diagnosis not present

## 2018-01-30 DIAGNOSIS — N2581 Secondary hyperparathyroidism of renal origin: Secondary | ICD-10-CM | POA: Diagnosis not present

## 2018-01-30 DIAGNOSIS — N186 End stage renal disease: Secondary | ICD-10-CM | POA: Diagnosis not present

## 2018-01-30 DIAGNOSIS — Z4932 Encounter for adequacy testing for peritoneal dialysis: Secondary | ICD-10-CM | POA: Diagnosis not present

## 2018-01-30 DIAGNOSIS — N2589 Other disorders resulting from impaired renal tubular function: Secondary | ICD-10-CM | POA: Diagnosis not present

## 2018-01-30 DIAGNOSIS — D631 Anemia in chronic kidney disease: Secondary | ICD-10-CM | POA: Diagnosis not present

## 2018-01-31 DIAGNOSIS — N2581 Secondary hyperparathyroidism of renal origin: Secondary | ICD-10-CM | POA: Diagnosis not present

## 2018-01-31 DIAGNOSIS — N186 End stage renal disease: Secondary | ICD-10-CM | POA: Diagnosis not present

## 2018-01-31 DIAGNOSIS — N2589 Other disorders resulting from impaired renal tubular function: Secondary | ICD-10-CM | POA: Diagnosis not present

## 2018-01-31 DIAGNOSIS — Z4932 Encounter for adequacy testing for peritoneal dialysis: Secondary | ICD-10-CM | POA: Diagnosis not present

## 2018-01-31 DIAGNOSIS — D631 Anemia in chronic kidney disease: Secondary | ICD-10-CM | POA: Diagnosis not present

## 2018-01-31 DIAGNOSIS — D509 Iron deficiency anemia, unspecified: Secondary | ICD-10-CM | POA: Diagnosis not present

## 2018-02-01 DIAGNOSIS — N2589 Other disorders resulting from impaired renal tubular function: Secondary | ICD-10-CM | POA: Diagnosis not present

## 2018-02-01 DIAGNOSIS — D509 Iron deficiency anemia, unspecified: Secondary | ICD-10-CM | POA: Diagnosis not present

## 2018-02-01 DIAGNOSIS — D631 Anemia in chronic kidney disease: Secondary | ICD-10-CM | POA: Diagnosis not present

## 2018-02-01 DIAGNOSIS — N2581 Secondary hyperparathyroidism of renal origin: Secondary | ICD-10-CM | POA: Diagnosis not present

## 2018-02-01 DIAGNOSIS — N186 End stage renal disease: Secondary | ICD-10-CM | POA: Diagnosis not present

## 2018-02-01 DIAGNOSIS — Z4932 Encounter for adequacy testing for peritoneal dialysis: Secondary | ICD-10-CM | POA: Diagnosis not present

## 2018-02-02 DIAGNOSIS — Z4932 Encounter for adequacy testing for peritoneal dialysis: Secondary | ICD-10-CM | POA: Diagnosis not present

## 2018-02-02 DIAGNOSIS — N2589 Other disorders resulting from impaired renal tubular function: Secondary | ICD-10-CM | POA: Diagnosis not present

## 2018-02-02 DIAGNOSIS — N2581 Secondary hyperparathyroidism of renal origin: Secondary | ICD-10-CM | POA: Diagnosis not present

## 2018-02-02 DIAGNOSIS — N186 End stage renal disease: Secondary | ICD-10-CM | POA: Diagnosis not present

## 2018-02-02 DIAGNOSIS — D509 Iron deficiency anemia, unspecified: Secondary | ICD-10-CM | POA: Diagnosis not present

## 2018-02-02 DIAGNOSIS — D631 Anemia in chronic kidney disease: Secondary | ICD-10-CM | POA: Diagnosis not present

## 2018-02-03 DIAGNOSIS — D631 Anemia in chronic kidney disease: Secondary | ICD-10-CM | POA: Diagnosis not present

## 2018-02-03 DIAGNOSIS — N2589 Other disorders resulting from impaired renal tubular function: Secondary | ICD-10-CM | POA: Diagnosis not present

## 2018-02-03 DIAGNOSIS — Z4932 Encounter for adequacy testing for peritoneal dialysis: Secondary | ICD-10-CM | POA: Diagnosis not present

## 2018-02-03 DIAGNOSIS — N186 End stage renal disease: Secondary | ICD-10-CM | POA: Diagnosis not present

## 2018-02-03 DIAGNOSIS — D509 Iron deficiency anemia, unspecified: Secondary | ICD-10-CM | POA: Diagnosis not present

## 2018-02-03 DIAGNOSIS — N2581 Secondary hyperparathyroidism of renal origin: Secondary | ICD-10-CM | POA: Diagnosis not present

## 2018-02-04 DIAGNOSIS — Z4932 Encounter for adequacy testing for peritoneal dialysis: Secondary | ICD-10-CM | POA: Diagnosis not present

## 2018-02-04 DIAGNOSIS — D509 Iron deficiency anemia, unspecified: Secondary | ICD-10-CM | POA: Diagnosis not present

## 2018-02-04 DIAGNOSIS — N2589 Other disorders resulting from impaired renal tubular function: Secondary | ICD-10-CM | POA: Diagnosis not present

## 2018-02-04 DIAGNOSIS — N186 End stage renal disease: Secondary | ICD-10-CM | POA: Diagnosis not present

## 2018-02-04 DIAGNOSIS — N2581 Secondary hyperparathyroidism of renal origin: Secondary | ICD-10-CM | POA: Diagnosis not present

## 2018-02-04 DIAGNOSIS — D631 Anemia in chronic kidney disease: Secondary | ICD-10-CM | POA: Diagnosis not present

## 2018-02-05 DIAGNOSIS — N2581 Secondary hyperparathyroidism of renal origin: Secondary | ICD-10-CM | POA: Diagnosis not present

## 2018-02-05 DIAGNOSIS — N2589 Other disorders resulting from impaired renal tubular function: Secondary | ICD-10-CM | POA: Diagnosis not present

## 2018-02-05 DIAGNOSIS — D509 Iron deficiency anemia, unspecified: Secondary | ICD-10-CM | POA: Diagnosis not present

## 2018-02-05 DIAGNOSIS — D631 Anemia in chronic kidney disease: Secondary | ICD-10-CM | POA: Diagnosis not present

## 2018-02-05 DIAGNOSIS — N186 End stage renal disease: Secondary | ICD-10-CM | POA: Diagnosis not present

## 2018-02-05 DIAGNOSIS — Z4932 Encounter for adequacy testing for peritoneal dialysis: Secondary | ICD-10-CM | POA: Diagnosis not present

## 2018-02-06 DIAGNOSIS — N186 End stage renal disease: Secondary | ICD-10-CM | POA: Diagnosis not present

## 2018-02-06 DIAGNOSIS — N2581 Secondary hyperparathyroidism of renal origin: Secondary | ICD-10-CM | POA: Diagnosis not present

## 2018-02-06 DIAGNOSIS — Z4932 Encounter for adequacy testing for peritoneal dialysis: Secondary | ICD-10-CM | POA: Diagnosis not present

## 2018-02-06 DIAGNOSIS — D509 Iron deficiency anemia, unspecified: Secondary | ICD-10-CM | POA: Diagnosis not present

## 2018-02-06 DIAGNOSIS — N2589 Other disorders resulting from impaired renal tubular function: Secondary | ICD-10-CM | POA: Diagnosis not present

## 2018-02-06 DIAGNOSIS — D631 Anemia in chronic kidney disease: Secondary | ICD-10-CM | POA: Diagnosis not present

## 2018-02-07 DIAGNOSIS — Z4932 Encounter for adequacy testing for peritoneal dialysis: Secondary | ICD-10-CM | POA: Diagnosis not present

## 2018-02-07 DIAGNOSIS — D631 Anemia in chronic kidney disease: Secondary | ICD-10-CM | POA: Diagnosis not present

## 2018-02-07 DIAGNOSIS — N186 End stage renal disease: Secondary | ICD-10-CM | POA: Diagnosis not present

## 2018-02-07 DIAGNOSIS — D509 Iron deficiency anemia, unspecified: Secondary | ICD-10-CM | POA: Diagnosis not present

## 2018-02-07 DIAGNOSIS — N2581 Secondary hyperparathyroidism of renal origin: Secondary | ICD-10-CM | POA: Diagnosis not present

## 2018-02-07 DIAGNOSIS — N2589 Other disorders resulting from impaired renal tubular function: Secondary | ICD-10-CM | POA: Diagnosis not present

## 2018-02-08 DIAGNOSIS — Z4932 Encounter for adequacy testing for peritoneal dialysis: Secondary | ICD-10-CM | POA: Diagnosis not present

## 2018-02-08 DIAGNOSIS — D631 Anemia in chronic kidney disease: Secondary | ICD-10-CM | POA: Diagnosis not present

## 2018-02-08 DIAGNOSIS — N186 End stage renal disease: Secondary | ICD-10-CM | POA: Diagnosis not present

## 2018-02-08 DIAGNOSIS — D509 Iron deficiency anemia, unspecified: Secondary | ICD-10-CM | POA: Diagnosis not present

## 2018-02-08 DIAGNOSIS — N2581 Secondary hyperparathyroidism of renal origin: Secondary | ICD-10-CM | POA: Diagnosis not present

## 2018-02-08 DIAGNOSIS — N2589 Other disorders resulting from impaired renal tubular function: Secondary | ICD-10-CM | POA: Diagnosis not present

## 2018-02-09 DIAGNOSIS — N2589 Other disorders resulting from impaired renal tubular function: Secondary | ICD-10-CM | POA: Diagnosis not present

## 2018-02-09 DIAGNOSIS — D631 Anemia in chronic kidney disease: Secondary | ICD-10-CM | POA: Diagnosis not present

## 2018-02-09 DIAGNOSIS — N186 End stage renal disease: Secondary | ICD-10-CM | POA: Diagnosis not present

## 2018-02-09 DIAGNOSIS — N2581 Secondary hyperparathyroidism of renal origin: Secondary | ICD-10-CM | POA: Diagnosis not present

## 2018-02-09 DIAGNOSIS — D509 Iron deficiency anemia, unspecified: Secondary | ICD-10-CM | POA: Diagnosis not present

## 2018-02-09 DIAGNOSIS — Z4932 Encounter for adequacy testing for peritoneal dialysis: Secondary | ICD-10-CM | POA: Diagnosis not present

## 2018-02-10 DIAGNOSIS — D509 Iron deficiency anemia, unspecified: Secondary | ICD-10-CM | POA: Diagnosis not present

## 2018-02-10 DIAGNOSIS — M62838 Other muscle spasm: Secondary | ICD-10-CM | POA: Diagnosis not present

## 2018-02-10 DIAGNOSIS — N2581 Secondary hyperparathyroidism of renal origin: Secondary | ICD-10-CM | POA: Diagnosis not present

## 2018-02-10 DIAGNOSIS — N2589 Other disorders resulting from impaired renal tubular function: Secondary | ICD-10-CM | POA: Diagnosis not present

## 2018-02-10 DIAGNOSIS — N186 End stage renal disease: Secondary | ICD-10-CM | POA: Diagnosis not present

## 2018-02-10 DIAGNOSIS — Z4932 Encounter for adequacy testing for peritoneal dialysis: Secondary | ICD-10-CM | POA: Diagnosis not present

## 2018-02-10 DIAGNOSIS — D631 Anemia in chronic kidney disease: Secondary | ICD-10-CM | POA: Diagnosis not present

## 2018-02-11 DIAGNOSIS — Z4932 Encounter for adequacy testing for peritoneal dialysis: Secondary | ICD-10-CM | POA: Diagnosis not present

## 2018-02-11 DIAGNOSIS — N2581 Secondary hyperparathyroidism of renal origin: Secondary | ICD-10-CM | POA: Diagnosis not present

## 2018-02-11 DIAGNOSIS — N186 End stage renal disease: Secondary | ICD-10-CM | POA: Diagnosis not present

## 2018-02-11 DIAGNOSIS — N2589 Other disorders resulting from impaired renal tubular function: Secondary | ICD-10-CM | POA: Diagnosis not present

## 2018-02-11 DIAGNOSIS — D509 Iron deficiency anemia, unspecified: Secondary | ICD-10-CM | POA: Diagnosis not present

## 2018-02-11 DIAGNOSIS — D631 Anemia in chronic kidney disease: Secondary | ICD-10-CM | POA: Diagnosis not present

## 2018-02-12 DIAGNOSIS — N2581 Secondary hyperparathyroidism of renal origin: Secondary | ICD-10-CM | POA: Diagnosis not present

## 2018-02-12 DIAGNOSIS — D631 Anemia in chronic kidney disease: Secondary | ICD-10-CM | POA: Diagnosis not present

## 2018-02-12 DIAGNOSIS — N2589 Other disorders resulting from impaired renal tubular function: Secondary | ICD-10-CM | POA: Diagnosis not present

## 2018-02-12 DIAGNOSIS — Z4932 Encounter for adequacy testing for peritoneal dialysis: Secondary | ICD-10-CM | POA: Diagnosis not present

## 2018-02-12 DIAGNOSIS — D509 Iron deficiency anemia, unspecified: Secondary | ICD-10-CM | POA: Diagnosis not present

## 2018-02-12 DIAGNOSIS — N186 End stage renal disease: Secondary | ICD-10-CM | POA: Diagnosis not present

## 2018-02-13 DIAGNOSIS — R51 Headache: Secondary | ICD-10-CM | POA: Diagnosis not present

## 2018-02-13 DIAGNOSIS — N2589 Other disorders resulting from impaired renal tubular function: Secondary | ICD-10-CM | POA: Diagnosis not present

## 2018-02-13 DIAGNOSIS — M542 Cervicalgia: Secondary | ICD-10-CM | POA: Diagnosis not present

## 2018-02-13 DIAGNOSIS — N2581 Secondary hyperparathyroidism of renal origin: Secondary | ICD-10-CM | POA: Diagnosis not present

## 2018-02-13 DIAGNOSIS — Z4932 Encounter for adequacy testing for peritoneal dialysis: Secondary | ICD-10-CM | POA: Diagnosis not present

## 2018-02-13 DIAGNOSIS — N186 End stage renal disease: Secondary | ICD-10-CM | POA: Diagnosis not present

## 2018-02-13 DIAGNOSIS — D631 Anemia in chronic kidney disease: Secondary | ICD-10-CM | POA: Diagnosis not present

## 2018-02-13 DIAGNOSIS — D509 Iron deficiency anemia, unspecified: Secondary | ICD-10-CM | POA: Diagnosis not present

## 2018-02-14 DIAGNOSIS — D631 Anemia in chronic kidney disease: Secondary | ICD-10-CM | POA: Diagnosis not present

## 2018-02-14 DIAGNOSIS — Z4932 Encounter for adequacy testing for peritoneal dialysis: Secondary | ICD-10-CM | POA: Diagnosis not present

## 2018-02-14 DIAGNOSIS — D509 Iron deficiency anemia, unspecified: Secondary | ICD-10-CM | POA: Diagnosis not present

## 2018-02-14 DIAGNOSIS — N186 End stage renal disease: Secondary | ICD-10-CM | POA: Diagnosis not present

## 2018-02-14 DIAGNOSIS — N2581 Secondary hyperparathyroidism of renal origin: Secondary | ICD-10-CM | POA: Diagnosis not present

## 2018-02-14 DIAGNOSIS — N2589 Other disorders resulting from impaired renal tubular function: Secondary | ICD-10-CM | POA: Diagnosis not present

## 2018-02-15 DIAGNOSIS — N186 End stage renal disease: Secondary | ICD-10-CM | POA: Diagnosis not present

## 2018-02-15 DIAGNOSIS — N2589 Other disorders resulting from impaired renal tubular function: Secondary | ICD-10-CM | POA: Diagnosis not present

## 2018-02-15 DIAGNOSIS — D631 Anemia in chronic kidney disease: Secondary | ICD-10-CM | POA: Diagnosis not present

## 2018-02-15 DIAGNOSIS — D509 Iron deficiency anemia, unspecified: Secondary | ICD-10-CM | POA: Diagnosis not present

## 2018-02-15 DIAGNOSIS — Z4932 Encounter for adequacy testing for peritoneal dialysis: Secondary | ICD-10-CM | POA: Diagnosis not present

## 2018-02-15 DIAGNOSIS — N2581 Secondary hyperparathyroidism of renal origin: Secondary | ICD-10-CM | POA: Diagnosis not present

## 2018-02-16 DIAGNOSIS — N2589 Other disorders resulting from impaired renal tubular function: Secondary | ICD-10-CM | POA: Diagnosis not present

## 2018-02-16 DIAGNOSIS — D631 Anemia in chronic kidney disease: Secondary | ICD-10-CM | POA: Diagnosis not present

## 2018-02-16 DIAGNOSIS — N186 End stage renal disease: Secondary | ICD-10-CM | POA: Diagnosis not present

## 2018-02-16 DIAGNOSIS — Z4932 Encounter for adequacy testing for peritoneal dialysis: Secondary | ICD-10-CM | POA: Diagnosis not present

## 2018-02-16 DIAGNOSIS — N2581 Secondary hyperparathyroidism of renal origin: Secondary | ICD-10-CM | POA: Diagnosis not present

## 2018-02-16 DIAGNOSIS — D509 Iron deficiency anemia, unspecified: Secondary | ICD-10-CM | POA: Diagnosis not present

## 2018-02-17 DIAGNOSIS — N186 End stage renal disease: Secondary | ICD-10-CM | POA: Diagnosis not present

## 2018-02-17 DIAGNOSIS — N2589 Other disorders resulting from impaired renal tubular function: Secondary | ICD-10-CM | POA: Diagnosis not present

## 2018-02-17 DIAGNOSIS — N2581 Secondary hyperparathyroidism of renal origin: Secondary | ICD-10-CM | POA: Diagnosis not present

## 2018-02-17 DIAGNOSIS — M542 Cervicalgia: Secondary | ICD-10-CM | POA: Diagnosis not present

## 2018-02-17 DIAGNOSIS — Z4932 Encounter for adequacy testing for peritoneal dialysis: Secondary | ICD-10-CM | POA: Diagnosis not present

## 2018-02-17 DIAGNOSIS — R51 Headache: Secondary | ICD-10-CM | POA: Diagnosis not present

## 2018-02-17 DIAGNOSIS — D631 Anemia in chronic kidney disease: Secondary | ICD-10-CM | POA: Diagnosis not present

## 2018-02-17 DIAGNOSIS — D509 Iron deficiency anemia, unspecified: Secondary | ICD-10-CM | POA: Diagnosis not present

## 2018-02-18 DIAGNOSIS — N2589 Other disorders resulting from impaired renal tubular function: Secondary | ICD-10-CM | POA: Diagnosis not present

## 2018-02-18 DIAGNOSIS — D631 Anemia in chronic kidney disease: Secondary | ICD-10-CM | POA: Diagnosis not present

## 2018-02-18 DIAGNOSIS — Z4932 Encounter for adequacy testing for peritoneal dialysis: Secondary | ICD-10-CM | POA: Diagnosis not present

## 2018-02-18 DIAGNOSIS — N186 End stage renal disease: Secondary | ICD-10-CM | POA: Diagnosis not present

## 2018-02-18 DIAGNOSIS — D509 Iron deficiency anemia, unspecified: Secondary | ICD-10-CM | POA: Diagnosis not present

## 2018-02-18 DIAGNOSIS — N2581 Secondary hyperparathyroidism of renal origin: Secondary | ICD-10-CM | POA: Diagnosis not present

## 2018-02-19 DIAGNOSIS — N2589 Other disorders resulting from impaired renal tubular function: Secondary | ICD-10-CM | POA: Diagnosis not present

## 2018-02-19 DIAGNOSIS — D631 Anemia in chronic kidney disease: Secondary | ICD-10-CM | POA: Diagnosis not present

## 2018-02-19 DIAGNOSIS — Z4932 Encounter for adequacy testing for peritoneal dialysis: Secondary | ICD-10-CM | POA: Diagnosis not present

## 2018-02-19 DIAGNOSIS — N2581 Secondary hyperparathyroidism of renal origin: Secondary | ICD-10-CM | POA: Diagnosis not present

## 2018-02-19 DIAGNOSIS — N186 End stage renal disease: Secondary | ICD-10-CM | POA: Diagnosis not present

## 2018-02-19 DIAGNOSIS — D509 Iron deficiency anemia, unspecified: Secondary | ICD-10-CM | POA: Diagnosis not present

## 2018-02-20 DIAGNOSIS — R51 Headache: Secondary | ICD-10-CM | POA: Diagnosis not present

## 2018-02-20 DIAGNOSIS — M542 Cervicalgia: Secondary | ICD-10-CM | POA: Diagnosis not present

## 2018-02-20 DIAGNOSIS — D509 Iron deficiency anemia, unspecified: Secondary | ICD-10-CM | POA: Diagnosis not present

## 2018-02-20 DIAGNOSIS — Z992 Dependence on renal dialysis: Secondary | ICD-10-CM | POA: Diagnosis not present

## 2018-02-20 DIAGNOSIS — Z79899 Other long term (current) drug therapy: Secondary | ICD-10-CM | POA: Diagnosis not present

## 2018-02-20 DIAGNOSIS — N2581 Secondary hyperparathyroidism of renal origin: Secondary | ICD-10-CM | POA: Diagnosis not present

## 2018-02-20 DIAGNOSIS — R17 Unspecified jaundice: Secondary | ICD-10-CM | POA: Diagnosis not present

## 2018-02-20 DIAGNOSIS — E440 Moderate protein-calorie malnutrition: Secondary | ICD-10-CM | POA: Diagnosis not present

## 2018-02-20 DIAGNOSIS — D631 Anemia in chronic kidney disease: Secondary | ICD-10-CM | POA: Diagnosis not present

## 2018-02-20 DIAGNOSIS — N186 End stage renal disease: Secondary | ICD-10-CM | POA: Diagnosis not present

## 2018-02-20 DIAGNOSIS — Z4932 Encounter for adequacy testing for peritoneal dialysis: Secondary | ICD-10-CM | POA: Diagnosis not present

## 2018-02-20 DIAGNOSIS — I129 Hypertensive chronic kidney disease with stage 1 through stage 4 chronic kidney disease, or unspecified chronic kidney disease: Secondary | ICD-10-CM | POA: Diagnosis not present

## 2018-02-21 DIAGNOSIS — R17 Unspecified jaundice: Secondary | ICD-10-CM | POA: Diagnosis not present

## 2018-02-21 DIAGNOSIS — Z4932 Encounter for adequacy testing for peritoneal dialysis: Secondary | ICD-10-CM | POA: Diagnosis not present

## 2018-02-21 DIAGNOSIS — N2581 Secondary hyperparathyroidism of renal origin: Secondary | ICD-10-CM | POA: Diagnosis not present

## 2018-02-21 DIAGNOSIS — D631 Anemia in chronic kidney disease: Secondary | ICD-10-CM | POA: Diagnosis not present

## 2018-02-21 DIAGNOSIS — D509 Iron deficiency anemia, unspecified: Secondary | ICD-10-CM | POA: Diagnosis not present

## 2018-02-21 DIAGNOSIS — N186 End stage renal disease: Secondary | ICD-10-CM | POA: Diagnosis not present

## 2018-02-22 DIAGNOSIS — R17 Unspecified jaundice: Secondary | ICD-10-CM | POA: Diagnosis not present

## 2018-02-22 DIAGNOSIS — N186 End stage renal disease: Secondary | ICD-10-CM | POA: Diagnosis not present

## 2018-02-22 DIAGNOSIS — D631 Anemia in chronic kidney disease: Secondary | ICD-10-CM | POA: Diagnosis not present

## 2018-02-22 DIAGNOSIS — Z4932 Encounter for adequacy testing for peritoneal dialysis: Secondary | ICD-10-CM | POA: Diagnosis not present

## 2018-02-22 DIAGNOSIS — D509 Iron deficiency anemia, unspecified: Secondary | ICD-10-CM | POA: Diagnosis not present

## 2018-02-22 DIAGNOSIS — N2581 Secondary hyperparathyroidism of renal origin: Secondary | ICD-10-CM | POA: Diagnosis not present

## 2018-02-23 ENCOUNTER — Ambulatory Visit (HOSPITAL_COMMUNITY)
Admission: EM | Admit: 2018-02-23 | Discharge: 2018-02-23 | Disposition: A | Discharge status: Home / Self Care | Payer: Medicare Other | Attending: Family Medicine | Admitting: Family Medicine

## 2018-02-23 ENCOUNTER — Encounter (HOSPITAL_COMMUNITY): Payer: Self-pay | Admitting: Emergency Medicine

## 2018-02-23 DIAGNOSIS — I120 Hypertensive chronic kidney disease with stage 5 chronic kidney disease or end stage renal disease: Secondary | ICD-10-CM | POA: Diagnosis not present

## 2018-02-23 DIAGNOSIS — N186 End stage renal disease: Secondary | ICD-10-CM | POA: Insufficient documentation

## 2018-02-23 DIAGNOSIS — Z79899 Other long term (current) drug therapy: Secondary | ICD-10-CM | POA: Diagnosis not present

## 2018-02-23 DIAGNOSIS — Z992 Dependence on renal dialysis: Secondary | ICD-10-CM | POA: Insufficient documentation

## 2018-02-23 DIAGNOSIS — N2581 Secondary hyperparathyroidism of renal origin: Secondary | ICD-10-CM | POA: Diagnosis not present

## 2018-02-23 DIAGNOSIS — E1122 Type 2 diabetes mellitus with diabetic chronic kidney disease: Secondary | ICD-10-CM | POA: Diagnosis not present

## 2018-02-23 DIAGNOSIS — Z888 Allergy status to other drugs, medicaments and biological substances status: Secondary | ICD-10-CM | POA: Diagnosis not present

## 2018-02-23 DIAGNOSIS — D631 Anemia in chronic kidney disease: Secondary | ICD-10-CM | POA: Diagnosis not present

## 2018-02-23 DIAGNOSIS — R17 Unspecified jaundice: Secondary | ICD-10-CM | POA: Diagnosis not present

## 2018-02-23 DIAGNOSIS — B182 Chronic viral hepatitis C: Secondary | ICD-10-CM | POA: Insufficient documentation

## 2018-02-23 DIAGNOSIS — E785 Hyperlipidemia, unspecified: Secondary | ICD-10-CM | POA: Insufficient documentation

## 2018-02-23 DIAGNOSIS — N390 Urinary tract infection, site not specified: Secondary | ICD-10-CM | POA: Diagnosis not present

## 2018-02-23 DIAGNOSIS — Z7982 Long term (current) use of aspirin: Secondary | ICD-10-CM | POA: Insufficient documentation

## 2018-02-23 DIAGNOSIS — R109 Unspecified abdominal pain: Secondary | ICD-10-CM | POA: Diagnosis present

## 2018-02-23 DIAGNOSIS — Z794 Long term (current) use of insulin: Secondary | ICD-10-CM | POA: Insufficient documentation

## 2018-02-23 DIAGNOSIS — Z4932 Encounter for adequacy testing for peritoneal dialysis: Secondary | ICD-10-CM | POA: Diagnosis not present

## 2018-02-23 DIAGNOSIS — D509 Iron deficiency anemia, unspecified: Secondary | ICD-10-CM | POA: Diagnosis not present

## 2018-02-23 HISTORY — DX: Disorder of kidney and ureter, unspecified: N28.9

## 2018-02-23 LAB — POCT URINALYSIS DIP (DEVICE)
Bilirubin Urine: NEGATIVE
GLUCOSE, UA: NEGATIVE mg/dL
Ketones, ur: NEGATIVE mg/dL
NITRITE: POSITIVE — AB
PROTEIN: 100 mg/dL — AB
Specific Gravity, Urine: 1.015 (ref 1.005–1.030)
UROBILINOGEN UA: 0.2 mg/dL (ref 0.0–1.0)
pH: 5.5 (ref 5.0–8.0)

## 2018-02-23 MED ORDER — CEPHALEXIN 500 MG PO CAPS: 500.0000 mg | ORAL_CAPSULE | Freq: Two times a day (BID) | ORAL | 0 refills | Status: DC

## 2018-02-23 NOTE — ED Triage Notes (Addendum)
Pt c/o lower abdominal pain, states shes had to urinate a lot, states she thought it was constipation so tried meidcine for that but had a BM but was still having pain and pressure in her lower abdomen. Pt also c/o hernia pain when she stands up. Pt is a dialysis pt and does peritoneal dialysis, states she does this every day, no issues with it, fluid return from dialysis is clear.

## 2018-02-23 NOTE — Discharge Instructions (Addendum)
Take the antibiotic 2 x a day Take 2 doses today Drink plenty of water We did lab testing during this visit.  (culture ) If there are any abnormal findings that require change in medicine or indicate a positive result, you will be notified.  If all of your tests are normal, you will not be called.

## 2018-02-23 NOTE — ED Provider Notes (Signed)
[facility]    CSN: [id number] Arrival date & time: 02/23/18  1236     History   Chief Complaint Chief Complaint  Patient presents with  . Appointment    1pm  . Abdominal Pain    HPI Maureen Barnes is a 57 y.o. female.   HPI  Patient has end-stage renal failure from diabetes and hypertension, and is on peritoneal dialysis.  She is here for abdominal pain.  She has lower abdominal crampy pain that comes and goes.  It is in her suprapubic region.  She is had no fever chills.  No nausea or vomiting.  She states that her dialysis fluids have been clear and are normal.  She is having normal bowel movements.  She has some burning with urination and urinary frequency.  She thinks she may have a bladder infection.  She has had bladder infections before.  No vaginal discharge.  No flank pain  Past Medical History:  Diagnosis Date  . Anemia   . Diabetes mellitus   . Hep C w/o coma, chronic ([place])   . Hypertension   . Renal disorder     Patient Active Problem List   Diagnosis Date Noted  . Hyperlipidemia 01/22/2017  . Palpitations 01/22/2017  . Dyspnea on exertion 01/22/2017  . CKD (chronic kidney disease) stage 3, GFR 30-59 ml/min (HCC) 10/18/2016  . Non-compliance 10/18/2016  . Chronic ethmoidal sinusitis 10/18/2016  . Hepatitis C, chronic ([place]) 05/15/2012  . Diabetes mellitus type 2, insulin dependent ([place]) 05/15/2012  . Hypertension 05/15/2012  . Anemia 05/15/2012    Past Surgical History:  Procedure Laterality Date  . TUBAL LIGATION      OB History    Gravida  3   Para  3   Term  3   Preterm      AB      Living  3     SAB      TAB      Ectopic      Multiple      Live Births               Home Medications    Prior to Admission medications   Medication Sig Start Date End Date Taking? Authorizing Provider  pantoprazole (PROTONIX) 40 MG tablet Take 40 mg by mouth daily.   Yes [provider]  simethicone (MYLICON) 80 MG  chewable tablet Chew 80 mg by mouth every 6 (six) hours as needed for flatulence.   Yes [provider]  amLODipine (NORVASC) 10 MG tablet Take 10 mg by mouth daily.      [provider]  aspirin EC 81 MG tablet Take 81 mg by mouth daily.    [provider]  atorvastatin (LIPITOR) 40 MG tablet Take 40 mg by mouth daily.    [provider]  b complex-vitamin c-folic acid (NEPHRO-VITE) 0.8 MG TABS tablet Take 1 tablet by mouth daily. 12/06/17   [provider]  calcitRIOL (ROCALTROL) 0.25 MCG capsule Take 0.25 mcg by mouth daily. Take 2 caps daily    [provider]  carvedilol (COREG) 12.5 MG tablet Take 12.5 mg by mouth 2 (two) times daily with a meal.    [provider]  cephALEXin (KEFLEX) 500 MG capsule Take 1 capsule (500 mg total) by mouth 2 (two) times daily. 02/23/18   Raylene Everts, MD  diphenhydrAMINE (BENADRYL) 25 MG tablet Take 1 tablet (25 mg total) by mouth at bedtime as needed.  11/12/16   Clent Demark, PA-C  docusate sodium (COLACE) 100 MG capsule Take 100 mg by mouth 2 (two) times daily.    [provider]  gentamicin cream (GARAMYCIN) 0.1 % APPLY SMALL AMOUNT TO EXIT SITE DAILY 01/10/18   [provider]  Insulin Glargine (LANTUS SOLOSTAR [place]) Inject 26 Units into the skin daily.    [provider]  lactulose, encephalopathy, (CHRONULAC) 10 GM/15ML SOLN Take 10 g by mouth.    [provider]  loratadine (CLARITIN) 10 MG tablet Take 10 mg by mouth as directed.    [provider]  losartan-hydrochlorothiazide (HYZAAR) 100-25 MG tablet Take 1 tablet by mouth daily.    [provider]  ranitidine (ZANTAC) 150 MG capsule Take 150 mg by mouth every evening.    [provider]    Family History Family History  Problem Relation Age of Onset  . Hypertension Mother   . Cancer Mother   . Diabetes Father   . Hypertension Father     Social History Social  History   Tobacco Use  . Smoking status: Never Smoker  . Smokeless tobacco: Never Used  Substance Use Topics  . Alcohol use: No  . Drug use: No     Allergies   Peanut oil; Shellfish-derived products; Iodine; Peanut-containing drug products; and Shellfish allergy   Review of Systems Review of Systems  Constitutional: Negative for chills and fever.  HENT: Negative for ear pain and sore throat.   Eyes: Negative for pain and visual disturbance.  Respiratory: Negative for cough and shortness of breath.   Cardiovascular: Negative for chest pain and palpitations.  Gastrointestinal: Positive for abdominal pain. Negative for abdominal distention and vomiting.  Genitourinary: Positive for dysuria. Negative for hematuria.  Musculoskeletal: Negative for arthralgias and back pain.  Skin: Negative for color change and rash.  Neurological: Negative for seizures and syncope.  All other systems reviewed and are negative.    Physical Exam Triage Vital Signs ED Triage Vitals [02/23/18 1247]  Enc Vitals Group     BP 131/83     Pulse Rate 80     Resp 19     Temp 98.6 F (37 C)     Temp src      SpO2 97 %     Weight      Height      Head Circumference      Peak Flow      Pain Score      Pain Loc      Pain Edu?      Excl. in [place]?    No data found.  Updated Vital Signs BP 131/83   Pulse 80   Temp 98.6 F (37 C)   Resp 19   LMP 07/10/2011   SpO2 97%       Physical Exam  Constitutional: She appears well-developed and well-nourished. No distress.  HENT:  Head: Normocephalic and atraumatic.  Mouth/Throat: Oropharynx is clear and moist.  Eyes: Pupils are equal, round, and reactive to light. Conjunctivae are normal.  Neck: Normal range of motion.  Cardiovascular: Normal rate, regular rhythm and normal heart sounds.  Pulmonary/Chest: Effort normal and breath sounds normal. No respiratory distress.  Abdominal: Soft. Normal appearance and bowel sounds are normal. She exhibits  no distension. There is no tenderness.  Musculoskeletal: Normal range of motion. She exhibits no edema.  Neurological: She is alert.  Skin: Skin is warm and dry.  Psychiatric: She has a normal mood and  affect. Her behavior is normal.     UC Treatments / Results  Labs (all labs ordered are listed, but only abnormal results are displayed) Labs Reviewed  POCT URINALYSIS DIP (DEVICE) - Abnormal; Notable for the following components:      Result Value   Hgb urine dipstick SMALL (*)    Protein, ur 100 (*)    Nitrite POSITIVE (*)    Leukocytes, UA SMALL (*)    All other components within normal limits  URINE CULTURE    EKG None  Radiology No results found.  Procedures Procedures (including critical care time)  Medications Ordered in UC Medications - No data to display  Initial Impression / Assessment and Plan / UC Course  I have reviewed the triage vital signs and the nursing notes.  Pertinent labs & imaging results that were available during my care of the patient were reviewed by me and considered in my medical decision making (see chart for details).      Final Clinical Impressions(s) / UC Diagnoses   Final diagnoses:  Lower urinary tract infectious disease     Discharge Instructions     Take the antibiotic 2 x a day Take 2 doses today Drink plenty of water We did lab testing during this visit.  (culture ) If there are any abnormal findings that require change in medicine or indicate a positive result, you will be notified.  If all of your tests are normal, you will not be called.      ED Prescriptions    Medication Sig Dispense Auth. Provider   cephALEXin (KEFLEX) 500 MG capsule Take 1 capsule (500 mg total) by mouth 2 (two) times daily. 10 capsule Raylene Everts, MD     Controlled Substance Prescriptions [place] Controlled Substance Registry consulted? Not Applicable   Raylene Everts, MD 02/23/18 [phone]

## 2018-02-23 NOTE — ED Notes (Signed)
Bed: UC01 Expected date:  Expected time:  Means of arrival:  Comments: For appointments

## 2018-02-24 DIAGNOSIS — R17 Unspecified jaundice: Secondary | ICD-10-CM | POA: Diagnosis not present

## 2018-02-24 DIAGNOSIS — R51 Headache: Secondary | ICD-10-CM | POA: Diagnosis not present

## 2018-02-24 DIAGNOSIS — N2581 Secondary hyperparathyroidism of renal origin: Secondary | ICD-10-CM | POA: Diagnosis not present

## 2018-02-24 DIAGNOSIS — Z4932 Encounter for adequacy testing for peritoneal dialysis: Secondary | ICD-10-CM | POA: Diagnosis not present

## 2018-02-24 DIAGNOSIS — D631 Anemia in chronic kidney disease: Secondary | ICD-10-CM | POA: Diagnosis not present

## 2018-02-24 DIAGNOSIS — D509 Iron deficiency anemia, unspecified: Secondary | ICD-10-CM | POA: Diagnosis not present

## 2018-02-24 DIAGNOSIS — N186 End stage renal disease: Secondary | ICD-10-CM | POA: Diagnosis not present

## 2018-02-24 DIAGNOSIS — M542 Cervicalgia: Secondary | ICD-10-CM | POA: Diagnosis not present

## 2018-02-24 LAB — URINE CULTURE
Culture: <10000 — AB
Special Requests: NORMAL

## 2018-02-25 DIAGNOSIS — D631 Anemia in chronic kidney disease: Secondary | ICD-10-CM | POA: Diagnosis not present

## 2018-02-25 DIAGNOSIS — D509 Iron deficiency anemia, unspecified: Secondary | ICD-10-CM | POA: Diagnosis not present

## 2018-02-25 DIAGNOSIS — R82994 Hypercalciuria: Secondary | ICD-10-CM | POA: Diagnosis not present

## 2018-02-25 DIAGNOSIS — Z4932 Encounter for adequacy testing for peritoneal dialysis: Secondary | ICD-10-CM | POA: Diagnosis not present

## 2018-02-25 DIAGNOSIS — N186 End stage renal disease: Secondary | ICD-10-CM | POA: Diagnosis not present

## 2018-02-25 DIAGNOSIS — N2581 Secondary hyperparathyroidism of renal origin: Secondary | ICD-10-CM | POA: Diagnosis not present

## 2018-02-25 DIAGNOSIS — R17 Unspecified jaundice: Secondary | ICD-10-CM | POA: Diagnosis not present

## 2018-02-26 DIAGNOSIS — D509 Iron deficiency anemia, unspecified: Secondary | ICD-10-CM | POA: Diagnosis not present

## 2018-02-26 DIAGNOSIS — N2581 Secondary hyperparathyroidism of renal origin: Secondary | ICD-10-CM | POA: Diagnosis not present

## 2018-02-26 DIAGNOSIS — Z4932 Encounter for adequacy testing for peritoneal dialysis: Secondary | ICD-10-CM | POA: Diagnosis not present

## 2018-02-26 DIAGNOSIS — N186 End stage renal disease: Secondary | ICD-10-CM | POA: Diagnosis not present

## 2018-02-26 DIAGNOSIS — R17 Unspecified jaundice: Secondary | ICD-10-CM | POA: Diagnosis not present

## 2018-02-26 DIAGNOSIS — D631 Anemia in chronic kidney disease: Secondary | ICD-10-CM | POA: Diagnosis not present

## 2018-02-27 DIAGNOSIS — M542 Cervicalgia: Secondary | ICD-10-CM | POA: Diagnosis not present

## 2018-02-27 DIAGNOSIS — Z4932 Encounter for adequacy testing for peritoneal dialysis: Secondary | ICD-10-CM | POA: Diagnosis not present

## 2018-02-27 DIAGNOSIS — N186 End stage renal disease: Secondary | ICD-10-CM | POA: Diagnosis not present

## 2018-02-27 DIAGNOSIS — R17 Unspecified jaundice: Secondary | ICD-10-CM | POA: Diagnosis not present

## 2018-02-27 DIAGNOSIS — Z005 Encounter for examination of potential donor of organ and tissue: Secondary | ICD-10-CM | POA: Diagnosis not present

## 2018-02-27 DIAGNOSIS — N2581 Secondary hyperparathyroidism of renal origin: Secondary | ICD-10-CM | POA: Diagnosis not present

## 2018-02-27 DIAGNOSIS — D631 Anemia in chronic kidney disease: Secondary | ICD-10-CM | POA: Diagnosis not present

## 2018-02-27 DIAGNOSIS — D509 Iron deficiency anemia, unspecified: Secondary | ICD-10-CM | POA: Diagnosis not present

## 2018-02-27 DIAGNOSIS — R51 Headache: Secondary | ICD-10-CM | POA: Diagnosis not present

## 2018-02-28 DIAGNOSIS — Z4932 Encounter for adequacy testing for peritoneal dialysis: Secondary | ICD-10-CM | POA: Diagnosis not present

## 2018-02-28 DIAGNOSIS — N2581 Secondary hyperparathyroidism of renal origin: Secondary | ICD-10-CM | POA: Diagnosis not present

## 2018-02-28 DIAGNOSIS — D509 Iron deficiency anemia, unspecified: Secondary | ICD-10-CM | POA: Diagnosis not present

## 2018-02-28 DIAGNOSIS — R17 Unspecified jaundice: Secondary | ICD-10-CM | POA: Diagnosis not present

## 2018-02-28 DIAGNOSIS — N186 End stage renal disease: Secondary | ICD-10-CM | POA: Diagnosis not present

## 2018-02-28 DIAGNOSIS — D631 Anemia in chronic kidney disease: Secondary | ICD-10-CM | POA: Diagnosis not present

## 2018-03-01 DIAGNOSIS — D631 Anemia in chronic kidney disease: Secondary | ICD-10-CM | POA: Diagnosis not present

## 2018-03-01 DIAGNOSIS — N2581 Secondary hyperparathyroidism of renal origin: Secondary | ICD-10-CM | POA: Diagnosis not present

## 2018-03-01 DIAGNOSIS — D509 Iron deficiency anemia, unspecified: Secondary | ICD-10-CM | POA: Diagnosis not present

## 2018-03-01 DIAGNOSIS — N186 End stage renal disease: Secondary | ICD-10-CM | POA: Diagnosis not present

## 2018-03-01 DIAGNOSIS — Z4932 Encounter for adequacy testing for peritoneal dialysis: Secondary | ICD-10-CM | POA: Diagnosis not present

## 2018-03-01 DIAGNOSIS — R17 Unspecified jaundice: Secondary | ICD-10-CM | POA: Diagnosis not present

## 2018-03-02 DIAGNOSIS — N2581 Secondary hyperparathyroidism of renal origin: Secondary | ICD-10-CM | POA: Diagnosis not present

## 2018-03-02 DIAGNOSIS — N186 End stage renal disease: Secondary | ICD-10-CM | POA: Diagnosis not present

## 2018-03-02 DIAGNOSIS — Z4932 Encounter for adequacy testing for peritoneal dialysis: Secondary | ICD-10-CM | POA: Diagnosis not present

## 2018-03-02 DIAGNOSIS — R17 Unspecified jaundice: Secondary | ICD-10-CM | POA: Diagnosis not present

## 2018-03-02 DIAGNOSIS — D509 Iron deficiency anemia, unspecified: Secondary | ICD-10-CM | POA: Diagnosis not present

## 2018-03-02 DIAGNOSIS — D631 Anemia in chronic kidney disease: Secondary | ICD-10-CM | POA: Diagnosis not present

## 2018-03-03 DIAGNOSIS — D631 Anemia in chronic kidney disease: Secondary | ICD-10-CM | POA: Diagnosis not present

## 2018-03-03 DIAGNOSIS — M542 Cervicalgia: Secondary | ICD-10-CM | POA: Diagnosis not present

## 2018-03-03 DIAGNOSIS — Z4932 Encounter for adequacy testing for peritoneal dialysis: Secondary | ICD-10-CM | POA: Diagnosis not present

## 2018-03-03 DIAGNOSIS — R17 Unspecified jaundice: Secondary | ICD-10-CM | POA: Diagnosis not present

## 2018-03-03 DIAGNOSIS — N2581 Secondary hyperparathyroidism of renal origin: Secondary | ICD-10-CM | POA: Diagnosis not present

## 2018-03-03 DIAGNOSIS — D509 Iron deficiency anemia, unspecified: Secondary | ICD-10-CM | POA: Diagnosis not present

## 2018-03-03 DIAGNOSIS — R51 Headache: Secondary | ICD-10-CM | POA: Diagnosis not present

## 2018-03-03 DIAGNOSIS — N186 End stage renal disease: Secondary | ICD-10-CM | POA: Diagnosis not present

## 2018-03-04 DIAGNOSIS — Z4932 Encounter for adequacy testing for peritoneal dialysis: Secondary | ICD-10-CM | POA: Diagnosis not present

## 2018-03-04 DIAGNOSIS — D631 Anemia in chronic kidney disease: Secondary | ICD-10-CM | POA: Diagnosis not present

## 2018-03-04 DIAGNOSIS — N2581 Secondary hyperparathyroidism of renal origin: Secondary | ICD-10-CM | POA: Diagnosis not present

## 2018-03-04 DIAGNOSIS — D509 Iron deficiency anemia, unspecified: Secondary | ICD-10-CM | POA: Diagnosis not present

## 2018-03-04 DIAGNOSIS — R17 Unspecified jaundice: Secondary | ICD-10-CM | POA: Diagnosis not present

## 2018-03-04 DIAGNOSIS — N186 End stage renal disease: Secondary | ICD-10-CM | POA: Diagnosis not present

## 2018-03-05 DIAGNOSIS — D631 Anemia in chronic kidney disease: Secondary | ICD-10-CM | POA: Diagnosis not present

## 2018-03-05 DIAGNOSIS — N186 End stage renal disease: Secondary | ICD-10-CM | POA: Diagnosis not present

## 2018-03-05 DIAGNOSIS — D509 Iron deficiency anemia, unspecified: Secondary | ICD-10-CM | POA: Diagnosis not present

## 2018-03-05 DIAGNOSIS — Z4932 Encounter for adequacy testing for peritoneal dialysis: Secondary | ICD-10-CM | POA: Diagnosis not present

## 2018-03-05 DIAGNOSIS — R17 Unspecified jaundice: Secondary | ICD-10-CM | POA: Diagnosis not present

## 2018-03-05 DIAGNOSIS — N2581 Secondary hyperparathyroidism of renal origin: Secondary | ICD-10-CM | POA: Diagnosis not present

## 2018-03-06 DIAGNOSIS — Z4932 Encounter for adequacy testing for peritoneal dialysis: Secondary | ICD-10-CM | POA: Diagnosis not present

## 2018-03-06 DIAGNOSIS — D509 Iron deficiency anemia, unspecified: Secondary | ICD-10-CM | POA: Diagnosis not present

## 2018-03-06 DIAGNOSIS — N2581 Secondary hyperparathyroidism of renal origin: Secondary | ICD-10-CM | POA: Diagnosis not present

## 2018-03-06 DIAGNOSIS — M542 Cervicalgia: Secondary | ICD-10-CM | POA: Diagnosis not present

## 2018-03-06 DIAGNOSIS — R51 Headache: Secondary | ICD-10-CM | POA: Diagnosis not present

## 2018-03-06 DIAGNOSIS — N186 End stage renal disease: Secondary | ICD-10-CM | POA: Diagnosis not present

## 2018-03-06 DIAGNOSIS — R17 Unspecified jaundice: Secondary | ICD-10-CM | POA: Diagnosis not present

## 2018-03-06 DIAGNOSIS — D631 Anemia in chronic kidney disease: Secondary | ICD-10-CM | POA: Diagnosis not present

## 2018-03-07 DIAGNOSIS — D631 Anemia in chronic kidney disease: Secondary | ICD-10-CM | POA: Diagnosis not present

## 2018-03-07 DIAGNOSIS — N2581 Secondary hyperparathyroidism of renal origin: Secondary | ICD-10-CM | POA: Diagnosis not present

## 2018-03-07 DIAGNOSIS — Z4932 Encounter for adequacy testing for peritoneal dialysis: Secondary | ICD-10-CM | POA: Diagnosis not present

## 2018-03-07 DIAGNOSIS — R17 Unspecified jaundice: Secondary | ICD-10-CM | POA: Diagnosis not present

## 2018-03-07 DIAGNOSIS — N186 End stage renal disease: Secondary | ICD-10-CM | POA: Diagnosis not present

## 2018-03-07 DIAGNOSIS — D509 Iron deficiency anemia, unspecified: Secondary | ICD-10-CM | POA: Diagnosis not present

## 2018-03-08 DIAGNOSIS — D631 Anemia in chronic kidney disease: Secondary | ICD-10-CM | POA: Diagnosis not present

## 2018-03-08 DIAGNOSIS — D509 Iron deficiency anemia, unspecified: Secondary | ICD-10-CM | POA: Diagnosis not present

## 2018-03-08 DIAGNOSIS — R17 Unspecified jaundice: Secondary | ICD-10-CM | POA: Diagnosis not present

## 2018-03-08 DIAGNOSIS — Z4932 Encounter for adequacy testing for peritoneal dialysis: Secondary | ICD-10-CM | POA: Diagnosis not present

## 2018-03-08 DIAGNOSIS — N2581 Secondary hyperparathyroidism of renal origin: Secondary | ICD-10-CM | POA: Diagnosis not present

## 2018-03-08 DIAGNOSIS — N186 End stage renal disease: Secondary | ICD-10-CM | POA: Diagnosis not present

## 2018-03-09 DIAGNOSIS — D509 Iron deficiency anemia, unspecified: Secondary | ICD-10-CM | POA: Diagnosis not present

## 2018-03-09 DIAGNOSIS — D631 Anemia in chronic kidney disease: Secondary | ICD-10-CM | POA: Diagnosis not present

## 2018-03-09 DIAGNOSIS — N186 End stage renal disease: Secondary | ICD-10-CM | POA: Diagnosis not present

## 2018-03-09 DIAGNOSIS — N2581 Secondary hyperparathyroidism of renal origin: Secondary | ICD-10-CM | POA: Diagnosis not present

## 2018-03-09 DIAGNOSIS — R17 Unspecified jaundice: Secondary | ICD-10-CM | POA: Diagnosis not present

## 2018-03-09 DIAGNOSIS — Z4932 Encounter for adequacy testing for peritoneal dialysis: Secondary | ICD-10-CM | POA: Diagnosis not present

## 2018-03-10 DIAGNOSIS — D509 Iron deficiency anemia, unspecified: Secondary | ICD-10-CM | POA: Diagnosis not present

## 2018-03-10 DIAGNOSIS — Z4932 Encounter for adequacy testing for peritoneal dialysis: Secondary | ICD-10-CM | POA: Diagnosis not present

## 2018-03-10 DIAGNOSIS — N186 End stage renal disease: Secondary | ICD-10-CM | POA: Diagnosis not present

## 2018-03-10 DIAGNOSIS — M542 Cervicalgia: Secondary | ICD-10-CM | POA: Diagnosis not present

## 2018-03-10 DIAGNOSIS — R51 Headache: Secondary | ICD-10-CM | POA: Diagnosis not present

## 2018-03-10 DIAGNOSIS — D631 Anemia in chronic kidney disease: Secondary | ICD-10-CM | POA: Diagnosis not present

## 2018-03-10 DIAGNOSIS — R17 Unspecified jaundice: Secondary | ICD-10-CM | POA: Diagnosis not present

## 2018-03-10 DIAGNOSIS — N2581 Secondary hyperparathyroidism of renal origin: Secondary | ICD-10-CM | POA: Diagnosis not present

## 2018-03-11 DIAGNOSIS — N2581 Secondary hyperparathyroidism of renal origin: Secondary | ICD-10-CM | POA: Diagnosis not present

## 2018-03-11 DIAGNOSIS — R17 Unspecified jaundice: Secondary | ICD-10-CM | POA: Diagnosis not present

## 2018-03-11 DIAGNOSIS — D631 Anemia in chronic kidney disease: Secondary | ICD-10-CM | POA: Diagnosis not present

## 2018-03-11 DIAGNOSIS — Z4932 Encounter for adequacy testing for peritoneal dialysis: Secondary | ICD-10-CM | POA: Diagnosis not present

## 2018-03-11 DIAGNOSIS — N186 End stage renal disease: Secondary | ICD-10-CM | POA: Diagnosis not present

## 2018-03-11 DIAGNOSIS — D509 Iron deficiency anemia, unspecified: Secondary | ICD-10-CM | POA: Diagnosis not present

## 2018-03-12 DIAGNOSIS — Z4932 Encounter for adequacy testing for peritoneal dialysis: Secondary | ICD-10-CM | POA: Diagnosis not present

## 2018-03-12 DIAGNOSIS — R17 Unspecified jaundice: Secondary | ICD-10-CM | POA: Diagnosis not present

## 2018-03-12 DIAGNOSIS — N186 End stage renal disease: Secondary | ICD-10-CM | POA: Diagnosis not present

## 2018-03-12 DIAGNOSIS — D509 Iron deficiency anemia, unspecified: Secondary | ICD-10-CM | POA: Diagnosis not present

## 2018-03-12 DIAGNOSIS — N2581 Secondary hyperparathyroidism of renal origin: Secondary | ICD-10-CM | POA: Diagnosis not present

## 2018-03-12 DIAGNOSIS — D631 Anemia in chronic kidney disease: Secondary | ICD-10-CM | POA: Diagnosis not present

## 2018-03-13 DIAGNOSIS — R51 Headache: Secondary | ICD-10-CM | POA: Diagnosis not present

## 2018-03-13 DIAGNOSIS — M542 Cervicalgia: Secondary | ICD-10-CM | POA: Diagnosis not present

## 2018-03-13 DIAGNOSIS — R17 Unspecified jaundice: Secondary | ICD-10-CM | POA: Diagnosis not present

## 2018-03-13 DIAGNOSIS — N2581 Secondary hyperparathyroidism of renal origin: Secondary | ICD-10-CM | POA: Diagnosis not present

## 2018-03-13 DIAGNOSIS — N186 End stage renal disease: Secondary | ICD-10-CM | POA: Diagnosis not present

## 2018-03-13 DIAGNOSIS — Z4932 Encounter for adequacy testing for peritoneal dialysis: Secondary | ICD-10-CM | POA: Diagnosis not present

## 2018-03-13 DIAGNOSIS — D631 Anemia in chronic kidney disease: Secondary | ICD-10-CM | POA: Diagnosis not present

## 2018-03-13 DIAGNOSIS — D509 Iron deficiency anemia, unspecified: Secondary | ICD-10-CM | POA: Diagnosis not present

## 2018-03-14 DIAGNOSIS — I10 Essential (primary) hypertension: Secondary | ICD-10-CM | POA: Diagnosis not present

## 2018-03-14 DIAGNOSIS — N2581 Secondary hyperparathyroidism of renal origin: Secondary | ICD-10-CM | POA: Diagnosis not present

## 2018-03-14 DIAGNOSIS — D509 Iron deficiency anemia, unspecified: Secondary | ICD-10-CM | POA: Diagnosis not present

## 2018-03-14 DIAGNOSIS — N186 End stage renal disease: Secondary | ICD-10-CM | POA: Diagnosis not present

## 2018-03-14 DIAGNOSIS — E782 Mixed hyperlipidemia: Secondary | ICD-10-CM | POA: Diagnosis not present

## 2018-03-14 DIAGNOSIS — E1122 Type 2 diabetes mellitus with diabetic chronic kidney disease: Secondary | ICD-10-CM | POA: Diagnosis not present

## 2018-03-14 DIAGNOSIS — D649 Anemia, unspecified: Secondary | ICD-10-CM | POA: Diagnosis not present

## 2018-03-14 DIAGNOSIS — D631 Anemia in chronic kidney disease: Secondary | ICD-10-CM | POA: Diagnosis not present

## 2018-03-14 DIAGNOSIS — N184 Chronic kidney disease, stage 4 (severe): Secondary | ICD-10-CM | POA: Diagnosis not present

## 2018-03-14 DIAGNOSIS — F322 Major depressive disorder, single episode, severe without psychotic features: Secondary | ICD-10-CM | POA: Diagnosis not present

## 2018-03-14 DIAGNOSIS — Z4932 Encounter for adequacy testing for peritoneal dialysis: Secondary | ICD-10-CM | POA: Diagnosis not present

## 2018-03-14 DIAGNOSIS — Z794 Long term (current) use of insulin: Secondary | ICD-10-CM | POA: Diagnosis not present

## 2018-03-14 DIAGNOSIS — R17 Unspecified jaundice: Secondary | ICD-10-CM | POA: Diagnosis not present

## 2018-03-15 DIAGNOSIS — R17 Unspecified jaundice: Secondary | ICD-10-CM | POA: Diagnosis not present

## 2018-03-15 DIAGNOSIS — N186 End stage renal disease: Secondary | ICD-10-CM | POA: Diagnosis not present

## 2018-03-15 DIAGNOSIS — N2581 Secondary hyperparathyroidism of renal origin: Secondary | ICD-10-CM | POA: Diagnosis not present

## 2018-03-15 DIAGNOSIS — Z4932 Encounter for adequacy testing for peritoneal dialysis: Secondary | ICD-10-CM | POA: Diagnosis not present

## 2018-03-15 DIAGNOSIS — D631 Anemia in chronic kidney disease: Secondary | ICD-10-CM | POA: Diagnosis not present

## 2018-03-15 DIAGNOSIS — D509 Iron deficiency anemia, unspecified: Secondary | ICD-10-CM | POA: Diagnosis not present

## 2018-03-16 DIAGNOSIS — R17 Unspecified jaundice: Secondary | ICD-10-CM | POA: Diagnosis not present

## 2018-03-16 DIAGNOSIS — D631 Anemia in chronic kidney disease: Secondary | ICD-10-CM | POA: Diagnosis not present

## 2018-03-16 DIAGNOSIS — N2581 Secondary hyperparathyroidism of renal origin: Secondary | ICD-10-CM | POA: Diagnosis not present

## 2018-03-16 DIAGNOSIS — D509 Iron deficiency anemia, unspecified: Secondary | ICD-10-CM | POA: Diagnosis not present

## 2018-03-16 DIAGNOSIS — Z4932 Encounter for adequacy testing for peritoneal dialysis: Secondary | ICD-10-CM | POA: Diagnosis not present

## 2018-03-16 DIAGNOSIS — N186 End stage renal disease: Secondary | ICD-10-CM | POA: Diagnosis not present

## 2018-03-17 DIAGNOSIS — D631 Anemia in chronic kidney disease: Secondary | ICD-10-CM | POA: Diagnosis not present

## 2018-03-17 DIAGNOSIS — Z4932 Encounter for adequacy testing for peritoneal dialysis: Secondary | ICD-10-CM | POA: Diagnosis not present

## 2018-03-17 DIAGNOSIS — D509 Iron deficiency anemia, unspecified: Secondary | ICD-10-CM | POA: Diagnosis not present

## 2018-03-17 DIAGNOSIS — N186 End stage renal disease: Secondary | ICD-10-CM | POA: Diagnosis not present

## 2018-03-17 DIAGNOSIS — R51 Headache: Secondary | ICD-10-CM | POA: Diagnosis not present

## 2018-03-17 DIAGNOSIS — N2581 Secondary hyperparathyroidism of renal origin: Secondary | ICD-10-CM | POA: Diagnosis not present

## 2018-03-17 DIAGNOSIS — R17 Unspecified jaundice: Secondary | ICD-10-CM | POA: Diagnosis not present

## 2018-03-17 DIAGNOSIS — M542 Cervicalgia: Secondary | ICD-10-CM | POA: Diagnosis not present

## 2018-03-18 DIAGNOSIS — D631 Anemia in chronic kidney disease: Secondary | ICD-10-CM | POA: Diagnosis not present

## 2018-03-18 DIAGNOSIS — Z4932 Encounter for adequacy testing for peritoneal dialysis: Secondary | ICD-10-CM | POA: Diagnosis not present

## 2018-03-18 DIAGNOSIS — N186 End stage renal disease: Secondary | ICD-10-CM | POA: Diagnosis not present

## 2018-03-18 DIAGNOSIS — D509 Iron deficiency anemia, unspecified: Secondary | ICD-10-CM | POA: Diagnosis not present

## 2018-03-18 DIAGNOSIS — R17 Unspecified jaundice: Secondary | ICD-10-CM | POA: Diagnosis not present

## 2018-03-18 DIAGNOSIS — N2581 Secondary hyperparathyroidism of renal origin: Secondary | ICD-10-CM | POA: Diagnosis not present

## 2018-03-19 DIAGNOSIS — N186 End stage renal disease: Secondary | ICD-10-CM | POA: Diagnosis not present

## 2018-03-19 DIAGNOSIS — D631 Anemia in chronic kidney disease: Secondary | ICD-10-CM | POA: Diagnosis not present

## 2018-03-19 DIAGNOSIS — R17 Unspecified jaundice: Secondary | ICD-10-CM | POA: Diagnosis not present

## 2018-03-19 DIAGNOSIS — D509 Iron deficiency anemia, unspecified: Secondary | ICD-10-CM | POA: Diagnosis not present

## 2018-03-19 DIAGNOSIS — Z4932 Encounter for adequacy testing for peritoneal dialysis: Secondary | ICD-10-CM | POA: Diagnosis not present

## 2018-03-19 DIAGNOSIS — N2581 Secondary hyperparathyroidism of renal origin: Secondary | ICD-10-CM | POA: Diagnosis not present

## 2018-03-20 DIAGNOSIS — Z4932 Encounter for adequacy testing for peritoneal dialysis: Secondary | ICD-10-CM | POA: Diagnosis not present

## 2018-03-20 DIAGNOSIS — D509 Iron deficiency anemia, unspecified: Secondary | ICD-10-CM | POA: Diagnosis not present

## 2018-03-20 DIAGNOSIS — R17 Unspecified jaundice: Secondary | ICD-10-CM | POA: Diagnosis not present

## 2018-03-20 DIAGNOSIS — R51 Headache: Secondary | ICD-10-CM | POA: Diagnosis not present

## 2018-03-20 DIAGNOSIS — N186 End stage renal disease: Secondary | ICD-10-CM | POA: Diagnosis not present

## 2018-03-20 DIAGNOSIS — N2581 Secondary hyperparathyroidism of renal origin: Secondary | ICD-10-CM | POA: Diagnosis not present

## 2018-03-20 DIAGNOSIS — D631 Anemia in chronic kidney disease: Secondary | ICD-10-CM | POA: Diagnosis not present

## 2018-03-20 DIAGNOSIS — M542 Cervicalgia: Secondary | ICD-10-CM | POA: Diagnosis not present

## 2018-03-21 DIAGNOSIS — D509 Iron deficiency anemia, unspecified: Secondary | ICD-10-CM | POA: Diagnosis not present

## 2018-03-21 DIAGNOSIS — N2581 Secondary hyperparathyroidism of renal origin: Secondary | ICD-10-CM | POA: Diagnosis not present

## 2018-03-21 DIAGNOSIS — D631 Anemia in chronic kidney disease: Secondary | ICD-10-CM | POA: Diagnosis not present

## 2018-03-21 DIAGNOSIS — Z4932 Encounter for adequacy testing for peritoneal dialysis: Secondary | ICD-10-CM | POA: Diagnosis not present

## 2018-03-21 DIAGNOSIS — R17 Unspecified jaundice: Secondary | ICD-10-CM | POA: Diagnosis not present

## 2018-03-21 DIAGNOSIS — N186 End stage renal disease: Secondary | ICD-10-CM | POA: Diagnosis not present

## 2018-03-22 DIAGNOSIS — Z4932 Encounter for adequacy testing for peritoneal dialysis: Secondary | ICD-10-CM | POA: Diagnosis not present

## 2018-03-22 DIAGNOSIS — N186 End stage renal disease: Secondary | ICD-10-CM | POA: Diagnosis not present

## 2018-03-22 DIAGNOSIS — N2581 Secondary hyperparathyroidism of renal origin: Secondary | ICD-10-CM | POA: Diagnosis not present

## 2018-03-22 DIAGNOSIS — D509 Iron deficiency anemia, unspecified: Secondary | ICD-10-CM | POA: Diagnosis not present

## 2018-03-22 DIAGNOSIS — D631 Anemia in chronic kidney disease: Secondary | ICD-10-CM | POA: Diagnosis not present

## 2018-03-22 DIAGNOSIS — R17 Unspecified jaundice: Secondary | ICD-10-CM | POA: Diagnosis not present

## 2018-03-23 DIAGNOSIS — Z992 Dependence on renal dialysis: Secondary | ICD-10-CM | POA: Diagnosis not present

## 2018-03-23 DIAGNOSIS — D509 Iron deficiency anemia, unspecified: Secondary | ICD-10-CM | POA: Diagnosis not present

## 2018-03-23 DIAGNOSIS — I129 Hypertensive chronic kidney disease with stage 1 through stage 4 chronic kidney disease, or unspecified chronic kidney disease: Secondary | ICD-10-CM | POA: Diagnosis not present

## 2018-03-23 DIAGNOSIS — Z23 Encounter for immunization: Secondary | ICD-10-CM | POA: Diagnosis not present

## 2018-03-23 DIAGNOSIS — Z4932 Encounter for adequacy testing for peritoneal dialysis: Secondary | ICD-10-CM | POA: Diagnosis not present

## 2018-03-23 DIAGNOSIS — R17 Unspecified jaundice: Secondary | ICD-10-CM | POA: Diagnosis not present

## 2018-03-23 DIAGNOSIS — N2581 Secondary hyperparathyroidism of renal origin: Secondary | ICD-10-CM | POA: Diagnosis not present

## 2018-03-23 DIAGNOSIS — E440 Moderate protein-calorie malnutrition: Secondary | ICD-10-CM | POA: Diagnosis not present

## 2018-03-23 DIAGNOSIS — N186 End stage renal disease: Secondary | ICD-10-CM | POA: Diagnosis not present

## 2018-03-23 DIAGNOSIS — Z79899 Other long term (current) drug therapy: Secondary | ICD-10-CM | POA: Diagnosis not present

## 2018-03-23 DIAGNOSIS — D631 Anemia in chronic kidney disease: Secondary | ICD-10-CM | POA: Diagnosis not present

## 2018-03-24 DIAGNOSIS — N186 End stage renal disease: Secondary | ICD-10-CM | POA: Diagnosis not present

## 2018-03-24 DIAGNOSIS — N2581 Secondary hyperparathyroidism of renal origin: Secondary | ICD-10-CM | POA: Diagnosis not present

## 2018-03-24 DIAGNOSIS — Z4932 Encounter for adequacy testing for peritoneal dialysis: Secondary | ICD-10-CM | POA: Diagnosis not present

## 2018-03-24 DIAGNOSIS — R17 Unspecified jaundice: Secondary | ICD-10-CM | POA: Diagnosis not present

## 2018-03-24 DIAGNOSIS — D631 Anemia in chronic kidney disease: Secondary | ICD-10-CM | POA: Diagnosis not present

## 2018-03-24 DIAGNOSIS — D509 Iron deficiency anemia, unspecified: Secondary | ICD-10-CM | POA: Diagnosis not present

## 2018-03-25 DIAGNOSIS — M542 Cervicalgia: Secondary | ICD-10-CM | POA: Diagnosis not present

## 2018-03-25 DIAGNOSIS — R17 Unspecified jaundice: Secondary | ICD-10-CM | POA: Diagnosis not present

## 2018-03-25 DIAGNOSIS — N2581 Secondary hyperparathyroidism of renal origin: Secondary | ICD-10-CM | POA: Diagnosis not present

## 2018-03-25 DIAGNOSIS — Z4932 Encounter for adequacy testing for peritoneal dialysis: Secondary | ICD-10-CM | POA: Diagnosis not present

## 2018-03-25 DIAGNOSIS — N186 End stage renal disease: Secondary | ICD-10-CM | POA: Diagnosis not present

## 2018-03-25 DIAGNOSIS — D631 Anemia in chronic kidney disease: Secondary | ICD-10-CM | POA: Diagnosis not present

## 2018-03-25 DIAGNOSIS — D509 Iron deficiency anemia, unspecified: Secondary | ICD-10-CM | POA: Diagnosis not present

## 2018-03-25 DIAGNOSIS — R51 Headache: Secondary | ICD-10-CM | POA: Diagnosis not present

## 2018-03-26 DIAGNOSIS — N186 End stage renal disease: Secondary | ICD-10-CM | POA: Diagnosis not present

## 2018-03-26 DIAGNOSIS — Z4932 Encounter for adequacy testing for peritoneal dialysis: Secondary | ICD-10-CM | POA: Diagnosis not present

## 2018-03-26 DIAGNOSIS — R17 Unspecified jaundice: Secondary | ICD-10-CM | POA: Diagnosis not present

## 2018-03-26 DIAGNOSIS — D631 Anemia in chronic kidney disease: Secondary | ICD-10-CM | POA: Diagnosis not present

## 2018-03-26 DIAGNOSIS — N2581 Secondary hyperparathyroidism of renal origin: Secondary | ICD-10-CM | POA: Diagnosis not present

## 2018-03-26 DIAGNOSIS — D509 Iron deficiency anemia, unspecified: Secondary | ICD-10-CM | POA: Diagnosis not present

## 2018-03-27 DIAGNOSIS — R17 Unspecified jaundice: Secondary | ICD-10-CM | POA: Diagnosis not present

## 2018-03-27 DIAGNOSIS — Z4932 Encounter for adequacy testing for peritoneal dialysis: Secondary | ICD-10-CM | POA: Diagnosis not present

## 2018-03-27 DIAGNOSIS — R51 Headache: Secondary | ICD-10-CM | POA: Diagnosis not present

## 2018-03-27 DIAGNOSIS — D509 Iron deficiency anemia, unspecified: Secondary | ICD-10-CM | POA: Diagnosis not present

## 2018-03-27 DIAGNOSIS — D631 Anemia in chronic kidney disease: Secondary | ICD-10-CM | POA: Diagnosis not present

## 2018-03-27 DIAGNOSIS — N2581 Secondary hyperparathyroidism of renal origin: Secondary | ICD-10-CM | POA: Diagnosis not present

## 2018-03-27 DIAGNOSIS — M542 Cervicalgia: Secondary | ICD-10-CM | POA: Diagnosis not present

## 2018-03-27 DIAGNOSIS — N186 End stage renal disease: Secondary | ICD-10-CM | POA: Diagnosis not present

## 2018-03-28 DIAGNOSIS — Z4932 Encounter for adequacy testing for peritoneal dialysis: Secondary | ICD-10-CM | POA: Diagnosis not present

## 2018-03-28 DIAGNOSIS — D509 Iron deficiency anemia, unspecified: Secondary | ICD-10-CM | POA: Diagnosis not present

## 2018-03-28 DIAGNOSIS — N186 End stage renal disease: Secondary | ICD-10-CM | POA: Diagnosis not present

## 2018-03-28 DIAGNOSIS — R17 Unspecified jaundice: Secondary | ICD-10-CM | POA: Diagnosis not present

## 2018-03-28 DIAGNOSIS — N2581 Secondary hyperparathyroidism of renal origin: Secondary | ICD-10-CM | POA: Diagnosis not present

## 2018-03-28 DIAGNOSIS — D631 Anemia in chronic kidney disease: Secondary | ICD-10-CM | POA: Diagnosis not present

## 2018-03-29 DIAGNOSIS — D631 Anemia in chronic kidney disease: Secondary | ICD-10-CM | POA: Diagnosis not present

## 2018-03-29 DIAGNOSIS — N2581 Secondary hyperparathyroidism of renal origin: Secondary | ICD-10-CM | POA: Diagnosis not present

## 2018-03-29 DIAGNOSIS — R17 Unspecified jaundice: Secondary | ICD-10-CM | POA: Diagnosis not present

## 2018-03-29 DIAGNOSIS — Z4932 Encounter for adequacy testing for peritoneal dialysis: Secondary | ICD-10-CM | POA: Diagnosis not present

## 2018-03-29 DIAGNOSIS — N186 End stage renal disease: Secondary | ICD-10-CM | POA: Diagnosis not present

## 2018-03-29 DIAGNOSIS — D509 Iron deficiency anemia, unspecified: Secondary | ICD-10-CM | POA: Diagnosis not present

## 2018-03-30 DIAGNOSIS — Z4932 Encounter for adequacy testing for peritoneal dialysis: Secondary | ICD-10-CM | POA: Diagnosis not present

## 2018-03-30 DIAGNOSIS — N2581 Secondary hyperparathyroidism of renal origin: Secondary | ICD-10-CM | POA: Diagnosis not present

## 2018-03-30 DIAGNOSIS — D631 Anemia in chronic kidney disease: Secondary | ICD-10-CM | POA: Diagnosis not present

## 2018-03-30 DIAGNOSIS — N186 End stage renal disease: Secondary | ICD-10-CM | POA: Diagnosis not present

## 2018-03-30 DIAGNOSIS — D509 Iron deficiency anemia, unspecified: Secondary | ICD-10-CM | POA: Diagnosis not present

## 2018-03-30 DIAGNOSIS — R17 Unspecified jaundice: Secondary | ICD-10-CM | POA: Diagnosis not present

## 2018-03-31 DIAGNOSIS — N186 End stage renal disease: Secondary | ICD-10-CM | POA: Diagnosis not present

## 2018-03-31 DIAGNOSIS — K4090 Unilateral inguinal hernia, without obstruction or gangrene, not specified as recurrent: Secondary | ICD-10-CM | POA: Diagnosis not present

## 2018-03-31 DIAGNOSIS — E119 Type 2 diabetes mellitus without complications: Secondary | ICD-10-CM | POA: Diagnosis not present

## 2018-03-31 DIAGNOSIS — D509 Iron deficiency anemia, unspecified: Secondary | ICD-10-CM | POA: Diagnosis not present

## 2018-03-31 DIAGNOSIS — D631 Anemia in chronic kidney disease: Secondary | ICD-10-CM | POA: Diagnosis not present

## 2018-03-31 DIAGNOSIS — N2581 Secondary hyperparathyroidism of renal origin: Secondary | ICD-10-CM | POA: Diagnosis not present

## 2018-03-31 DIAGNOSIS — R17 Unspecified jaundice: Secondary | ICD-10-CM | POA: Diagnosis not present

## 2018-03-31 DIAGNOSIS — Z4932 Encounter for adequacy testing for peritoneal dialysis: Secondary | ICD-10-CM | POA: Diagnosis not present

## 2018-04-01 DIAGNOSIS — D631 Anemia in chronic kidney disease: Secondary | ICD-10-CM | POA: Diagnosis not present

## 2018-04-01 DIAGNOSIS — N2581 Secondary hyperparathyroidism of renal origin: Secondary | ICD-10-CM | POA: Diagnosis not present

## 2018-04-01 DIAGNOSIS — M542 Cervicalgia: Secondary | ICD-10-CM | POA: Diagnosis not present

## 2018-04-01 DIAGNOSIS — Z4932 Encounter for adequacy testing for peritoneal dialysis: Secondary | ICD-10-CM | POA: Diagnosis not present

## 2018-04-01 DIAGNOSIS — D509 Iron deficiency anemia, unspecified: Secondary | ICD-10-CM | POA: Diagnosis not present

## 2018-04-01 DIAGNOSIS — R51 Headache: Secondary | ICD-10-CM | POA: Diagnosis not present

## 2018-04-01 DIAGNOSIS — N186 End stage renal disease: Secondary | ICD-10-CM | POA: Diagnosis not present

## 2018-04-01 DIAGNOSIS — R17 Unspecified jaundice: Secondary | ICD-10-CM | POA: Diagnosis not present

## 2018-04-02 DIAGNOSIS — Z4932 Encounter for adequacy testing for peritoneal dialysis: Secondary | ICD-10-CM | POA: Diagnosis not present

## 2018-04-02 DIAGNOSIS — R17 Unspecified jaundice: Secondary | ICD-10-CM | POA: Diagnosis not present

## 2018-04-02 DIAGNOSIS — R82994 Hypercalciuria: Secondary | ICD-10-CM | POA: Diagnosis not present

## 2018-04-02 DIAGNOSIS — N2581 Secondary hyperparathyroidism of renal origin: Secondary | ICD-10-CM | POA: Diagnosis not present

## 2018-04-02 DIAGNOSIS — N186 End stage renal disease: Secondary | ICD-10-CM | POA: Diagnosis not present

## 2018-04-02 DIAGNOSIS — D509 Iron deficiency anemia, unspecified: Secondary | ICD-10-CM | POA: Diagnosis not present

## 2018-04-02 DIAGNOSIS — D631 Anemia in chronic kidney disease: Secondary | ICD-10-CM | POA: Diagnosis not present

## 2018-04-02 DIAGNOSIS — Z0000 Encounter for general adult medical examination without abnormal findings: Secondary | ICD-10-CM | POA: Insufficient documentation

## 2018-04-03 DIAGNOSIS — D509 Iron deficiency anemia, unspecified: Secondary | ICD-10-CM | POA: Diagnosis not present

## 2018-04-03 DIAGNOSIS — N186 End stage renal disease: Secondary | ICD-10-CM | POA: Diagnosis not present

## 2018-04-03 DIAGNOSIS — Z4932 Encounter for adequacy testing for peritoneal dialysis: Secondary | ICD-10-CM | POA: Diagnosis not present

## 2018-04-03 DIAGNOSIS — R17 Unspecified jaundice: Secondary | ICD-10-CM | POA: Diagnosis not present

## 2018-04-03 DIAGNOSIS — N2581 Secondary hyperparathyroidism of renal origin: Secondary | ICD-10-CM | POA: Diagnosis not present

## 2018-04-03 DIAGNOSIS — D631 Anemia in chronic kidney disease: Secondary | ICD-10-CM | POA: Diagnosis not present

## 2018-04-04 DIAGNOSIS — R17 Unspecified jaundice: Secondary | ICD-10-CM | POA: Diagnosis not present

## 2018-04-04 DIAGNOSIS — D509 Iron deficiency anemia, unspecified: Secondary | ICD-10-CM | POA: Diagnosis not present

## 2018-04-04 DIAGNOSIS — N2581 Secondary hyperparathyroidism of renal origin: Secondary | ICD-10-CM | POA: Diagnosis not present

## 2018-04-04 DIAGNOSIS — D631 Anemia in chronic kidney disease: Secondary | ICD-10-CM | POA: Diagnosis not present

## 2018-04-04 DIAGNOSIS — Z4932 Encounter for adequacy testing for peritoneal dialysis: Secondary | ICD-10-CM | POA: Diagnosis not present

## 2018-04-04 DIAGNOSIS — N186 End stage renal disease: Secondary | ICD-10-CM | POA: Diagnosis not present

## 2018-04-05 DIAGNOSIS — D631 Anemia in chronic kidney disease: Secondary | ICD-10-CM | POA: Diagnosis not present

## 2018-04-05 DIAGNOSIS — N186 End stage renal disease: Secondary | ICD-10-CM | POA: Diagnosis not present

## 2018-04-05 DIAGNOSIS — D509 Iron deficiency anemia, unspecified: Secondary | ICD-10-CM | POA: Diagnosis not present

## 2018-04-05 DIAGNOSIS — Z4932 Encounter for adequacy testing for peritoneal dialysis: Secondary | ICD-10-CM | POA: Diagnosis not present

## 2018-04-05 DIAGNOSIS — N2581 Secondary hyperparathyroidism of renal origin: Secondary | ICD-10-CM | POA: Diagnosis not present

## 2018-04-05 DIAGNOSIS — R17 Unspecified jaundice: Secondary | ICD-10-CM | POA: Diagnosis not present

## 2018-04-06 DIAGNOSIS — N186 End stage renal disease: Secondary | ICD-10-CM | POA: Diagnosis not present

## 2018-04-06 DIAGNOSIS — N2581 Secondary hyperparathyroidism of renal origin: Secondary | ICD-10-CM | POA: Diagnosis not present

## 2018-04-06 DIAGNOSIS — D631 Anemia in chronic kidney disease: Secondary | ICD-10-CM | POA: Diagnosis not present

## 2018-04-06 DIAGNOSIS — D509 Iron deficiency anemia, unspecified: Secondary | ICD-10-CM | POA: Diagnosis not present

## 2018-04-06 DIAGNOSIS — R17 Unspecified jaundice: Secondary | ICD-10-CM | POA: Diagnosis not present

## 2018-04-06 DIAGNOSIS — Z4932 Encounter for adequacy testing for peritoneal dialysis: Secondary | ICD-10-CM | POA: Diagnosis not present

## 2018-04-07 DIAGNOSIS — N186 End stage renal disease: Secondary | ICD-10-CM | POA: Diagnosis not present

## 2018-04-07 DIAGNOSIS — D631 Anemia in chronic kidney disease: Secondary | ICD-10-CM | POA: Diagnosis not present

## 2018-04-07 DIAGNOSIS — N2581 Secondary hyperparathyroidism of renal origin: Secondary | ICD-10-CM | POA: Diagnosis not present

## 2018-04-07 DIAGNOSIS — Z4932 Encounter for adequacy testing for peritoneal dialysis: Secondary | ICD-10-CM | POA: Diagnosis not present

## 2018-04-07 DIAGNOSIS — R17 Unspecified jaundice: Secondary | ICD-10-CM | POA: Diagnosis not present

## 2018-04-07 DIAGNOSIS — D509 Iron deficiency anemia, unspecified: Secondary | ICD-10-CM | POA: Diagnosis not present

## 2018-04-08 DIAGNOSIS — N2581 Secondary hyperparathyroidism of renal origin: Secondary | ICD-10-CM | POA: Diagnosis not present

## 2018-04-08 DIAGNOSIS — Z4932 Encounter for adequacy testing for peritoneal dialysis: Secondary | ICD-10-CM | POA: Diagnosis not present

## 2018-04-08 DIAGNOSIS — M542 Cervicalgia: Secondary | ICD-10-CM | POA: Diagnosis not present

## 2018-04-08 DIAGNOSIS — R17 Unspecified jaundice: Secondary | ICD-10-CM | POA: Diagnosis not present

## 2018-04-08 DIAGNOSIS — R51 Headache: Secondary | ICD-10-CM | POA: Diagnosis not present

## 2018-04-08 DIAGNOSIS — D631 Anemia in chronic kidney disease: Secondary | ICD-10-CM | POA: Diagnosis not present

## 2018-04-08 DIAGNOSIS — D509 Iron deficiency anemia, unspecified: Secondary | ICD-10-CM | POA: Diagnosis not present

## 2018-04-08 DIAGNOSIS — N186 End stage renal disease: Secondary | ICD-10-CM | POA: Diagnosis not present

## 2018-04-09 DIAGNOSIS — N2581 Secondary hyperparathyroidism of renal origin: Secondary | ICD-10-CM | POA: Diagnosis not present

## 2018-04-09 DIAGNOSIS — D631 Anemia in chronic kidney disease: Secondary | ICD-10-CM | POA: Diagnosis not present

## 2018-04-09 DIAGNOSIS — Z4932 Encounter for adequacy testing for peritoneal dialysis: Secondary | ICD-10-CM | POA: Diagnosis not present

## 2018-04-09 DIAGNOSIS — N186 End stage renal disease: Secondary | ICD-10-CM | POA: Diagnosis not present

## 2018-04-09 DIAGNOSIS — R17 Unspecified jaundice: Secondary | ICD-10-CM | POA: Diagnosis not present

## 2018-04-09 DIAGNOSIS — D509 Iron deficiency anemia, unspecified: Secondary | ICD-10-CM | POA: Diagnosis not present

## 2018-04-10 DIAGNOSIS — R51 Headache: Secondary | ICD-10-CM | POA: Diagnosis not present

## 2018-04-10 DIAGNOSIS — M542 Cervicalgia: Secondary | ICD-10-CM | POA: Diagnosis not present

## 2018-04-10 DIAGNOSIS — R17 Unspecified jaundice: Secondary | ICD-10-CM | POA: Diagnosis not present

## 2018-04-10 DIAGNOSIS — N186 End stage renal disease: Secondary | ICD-10-CM | POA: Diagnosis not present

## 2018-04-10 DIAGNOSIS — D631 Anemia in chronic kidney disease: Secondary | ICD-10-CM | POA: Diagnosis not present

## 2018-04-10 DIAGNOSIS — D509 Iron deficiency anemia, unspecified: Secondary | ICD-10-CM | POA: Diagnosis not present

## 2018-04-10 DIAGNOSIS — Z4932 Encounter for adequacy testing for peritoneal dialysis: Secondary | ICD-10-CM | POA: Diagnosis not present

## 2018-04-10 DIAGNOSIS — N2581 Secondary hyperparathyroidism of renal origin: Secondary | ICD-10-CM | POA: Diagnosis not present

## 2018-04-11 DIAGNOSIS — D509 Iron deficiency anemia, unspecified: Secondary | ICD-10-CM | POA: Diagnosis not present

## 2018-04-11 DIAGNOSIS — N186 End stage renal disease: Secondary | ICD-10-CM | POA: Diagnosis not present

## 2018-04-11 DIAGNOSIS — N2581 Secondary hyperparathyroidism of renal origin: Secondary | ICD-10-CM | POA: Diagnosis not present

## 2018-04-11 DIAGNOSIS — R17 Unspecified jaundice: Secondary | ICD-10-CM | POA: Diagnosis not present

## 2018-04-11 DIAGNOSIS — Z4932 Encounter for adequacy testing for peritoneal dialysis: Secondary | ICD-10-CM | POA: Diagnosis not present

## 2018-04-11 DIAGNOSIS — D631 Anemia in chronic kidney disease: Secondary | ICD-10-CM | POA: Diagnosis not present

## 2018-04-12 DIAGNOSIS — D631 Anemia in chronic kidney disease: Secondary | ICD-10-CM | POA: Diagnosis not present

## 2018-04-12 DIAGNOSIS — Z4932 Encounter for adequacy testing for peritoneal dialysis: Secondary | ICD-10-CM | POA: Diagnosis not present

## 2018-04-12 DIAGNOSIS — N2581 Secondary hyperparathyroidism of renal origin: Secondary | ICD-10-CM | POA: Diagnosis not present

## 2018-04-12 DIAGNOSIS — R17 Unspecified jaundice: Secondary | ICD-10-CM | POA: Diagnosis not present

## 2018-04-12 DIAGNOSIS — N186 End stage renal disease: Secondary | ICD-10-CM | POA: Diagnosis not present

## 2018-04-12 DIAGNOSIS — D509 Iron deficiency anemia, unspecified: Secondary | ICD-10-CM | POA: Diagnosis not present

## 2018-04-13 DIAGNOSIS — D631 Anemia in chronic kidney disease: Secondary | ICD-10-CM | POA: Diagnosis not present

## 2018-04-13 DIAGNOSIS — Z4932 Encounter for adequacy testing for peritoneal dialysis: Secondary | ICD-10-CM | POA: Diagnosis not present

## 2018-04-13 DIAGNOSIS — R17 Unspecified jaundice: Secondary | ICD-10-CM | POA: Diagnosis not present

## 2018-04-13 DIAGNOSIS — N2581 Secondary hyperparathyroidism of renal origin: Secondary | ICD-10-CM | POA: Diagnosis not present

## 2018-04-13 DIAGNOSIS — D509 Iron deficiency anemia, unspecified: Secondary | ICD-10-CM | POA: Diagnosis not present

## 2018-04-13 DIAGNOSIS — N186 End stage renal disease: Secondary | ICD-10-CM | POA: Diagnosis not present

## 2018-04-14 DIAGNOSIS — D631 Anemia in chronic kidney disease: Secondary | ICD-10-CM | POA: Diagnosis not present

## 2018-04-14 DIAGNOSIS — D509 Iron deficiency anemia, unspecified: Secondary | ICD-10-CM | POA: Diagnosis not present

## 2018-04-14 DIAGNOSIS — Z4932 Encounter for adequacy testing for peritoneal dialysis: Secondary | ICD-10-CM | POA: Diagnosis not present

## 2018-04-14 DIAGNOSIS — R17 Unspecified jaundice: Secondary | ICD-10-CM | POA: Diagnosis not present

## 2018-04-14 DIAGNOSIS — N2581 Secondary hyperparathyroidism of renal origin: Secondary | ICD-10-CM | POA: Diagnosis not present

## 2018-04-14 DIAGNOSIS — N186 End stage renal disease: Secondary | ICD-10-CM | POA: Diagnosis not present

## 2018-04-15 DIAGNOSIS — Z4932 Encounter for adequacy testing for peritoneal dialysis: Secondary | ICD-10-CM | POA: Diagnosis not present

## 2018-04-15 DIAGNOSIS — D631 Anemia in chronic kidney disease: Secondary | ICD-10-CM | POA: Diagnosis not present

## 2018-04-15 DIAGNOSIS — N2581 Secondary hyperparathyroidism of renal origin: Secondary | ICD-10-CM | POA: Diagnosis not present

## 2018-04-15 DIAGNOSIS — D509 Iron deficiency anemia, unspecified: Secondary | ICD-10-CM | POA: Diagnosis not present

## 2018-04-15 DIAGNOSIS — N186 End stage renal disease: Secondary | ICD-10-CM | POA: Diagnosis not present

## 2018-04-15 DIAGNOSIS — R17 Unspecified jaundice: Secondary | ICD-10-CM | POA: Diagnosis not present

## 2018-04-16 DIAGNOSIS — R17 Unspecified jaundice: Secondary | ICD-10-CM | POA: Diagnosis not present

## 2018-04-16 DIAGNOSIS — D631 Anemia in chronic kidney disease: Secondary | ICD-10-CM | POA: Diagnosis not present

## 2018-04-16 DIAGNOSIS — N2581 Secondary hyperparathyroidism of renal origin: Secondary | ICD-10-CM | POA: Diagnosis not present

## 2018-04-16 DIAGNOSIS — N186 End stage renal disease: Secondary | ICD-10-CM | POA: Diagnosis not present

## 2018-04-16 DIAGNOSIS — Z4932 Encounter for adequacy testing for peritoneal dialysis: Secondary | ICD-10-CM | POA: Diagnosis not present

## 2018-04-16 DIAGNOSIS — D509 Iron deficiency anemia, unspecified: Secondary | ICD-10-CM | POA: Diagnosis not present

## 2018-04-17 ENCOUNTER — Ambulatory Visit: Payer: Medicare Other | Admitting: Podiatry

## 2018-04-17 DIAGNOSIS — D649 Anemia, unspecified: Secondary | ICD-10-CM | POA: Diagnosis not present

## 2018-04-17 DIAGNOSIS — Z4932 Encounter for adequacy testing for peritoneal dialysis: Secondary | ICD-10-CM | POA: Diagnosis not present

## 2018-04-17 DIAGNOSIS — N186 End stage renal disease: Secondary | ICD-10-CM | POA: Diagnosis not present

## 2018-04-17 DIAGNOSIS — I120 Hypertensive chronic kidney disease with stage 5 chronic kidney disease or end stage renal disease: Secondary | ICD-10-CM | POA: Diagnosis not present

## 2018-04-17 DIAGNOSIS — Z794 Long term (current) use of insulin: Secondary | ICD-10-CM | POA: Diagnosis not present

## 2018-04-17 DIAGNOSIS — N2581 Secondary hyperparathyroidism of renal origin: Secondary | ICD-10-CM | POA: Diagnosis not present

## 2018-04-17 DIAGNOSIS — D631 Anemia in chronic kidney disease: Secondary | ICD-10-CM | POA: Diagnosis not present

## 2018-04-17 DIAGNOSIS — K4090 Unilateral inguinal hernia, without obstruction or gangrene, not specified as recurrent: Secondary | ICD-10-CM | POA: Diagnosis not present

## 2018-04-17 DIAGNOSIS — F419 Anxiety disorder, unspecified: Secondary | ICD-10-CM | POA: Diagnosis not present

## 2018-04-17 DIAGNOSIS — E785 Hyperlipidemia, unspecified: Secondary | ICD-10-CM | POA: Diagnosis not present

## 2018-04-17 DIAGNOSIS — D509 Iron deficiency anemia, unspecified: Secondary | ICD-10-CM | POA: Diagnosis not present

## 2018-04-17 DIAGNOSIS — E1122 Type 2 diabetes mellitus with diabetic chronic kidney disease: Secondary | ICD-10-CM | POA: Diagnosis not present

## 2018-04-17 DIAGNOSIS — N185 Chronic kidney disease, stage 5: Secondary | ICD-10-CM | POA: Diagnosis not present

## 2018-04-17 DIAGNOSIS — F329 Major depressive disorder, single episode, unspecified: Secondary | ICD-10-CM | POA: Diagnosis not present

## 2018-04-17 DIAGNOSIS — R17 Unspecified jaundice: Secondary | ICD-10-CM | POA: Diagnosis not present

## 2018-04-17 DIAGNOSIS — Z992 Dependence on renal dialysis: Secondary | ICD-10-CM | POA: Diagnosis not present

## 2018-04-18 DIAGNOSIS — R17 Unspecified jaundice: Secondary | ICD-10-CM | POA: Diagnosis not present

## 2018-04-18 DIAGNOSIS — D631 Anemia in chronic kidney disease: Secondary | ICD-10-CM | POA: Diagnosis not present

## 2018-04-18 DIAGNOSIS — N2581 Secondary hyperparathyroidism of renal origin: Secondary | ICD-10-CM | POA: Diagnosis not present

## 2018-04-18 DIAGNOSIS — D509 Iron deficiency anemia, unspecified: Secondary | ICD-10-CM | POA: Diagnosis not present

## 2018-04-18 DIAGNOSIS — N186 End stage renal disease: Secondary | ICD-10-CM | POA: Diagnosis not present

## 2018-04-18 DIAGNOSIS — Z4932 Encounter for adequacy testing for peritoneal dialysis: Secondary | ICD-10-CM | POA: Diagnosis not present

## 2018-04-19 DIAGNOSIS — D509 Iron deficiency anemia, unspecified: Secondary | ICD-10-CM | POA: Diagnosis not present

## 2018-04-19 DIAGNOSIS — R17 Unspecified jaundice: Secondary | ICD-10-CM | POA: Diagnosis not present

## 2018-04-19 DIAGNOSIS — N2581 Secondary hyperparathyroidism of renal origin: Secondary | ICD-10-CM | POA: Diagnosis not present

## 2018-04-19 DIAGNOSIS — D631 Anemia in chronic kidney disease: Secondary | ICD-10-CM | POA: Diagnosis not present

## 2018-04-19 DIAGNOSIS — N186 End stage renal disease: Secondary | ICD-10-CM | POA: Diagnosis not present

## 2018-04-19 DIAGNOSIS — Z4932 Encounter for adequacy testing for peritoneal dialysis: Secondary | ICD-10-CM | POA: Diagnosis not present

## 2018-04-20 DIAGNOSIS — N2581 Secondary hyperparathyroidism of renal origin: Secondary | ICD-10-CM | POA: Diagnosis not present

## 2018-04-20 DIAGNOSIS — R17 Unspecified jaundice: Secondary | ICD-10-CM | POA: Diagnosis not present

## 2018-04-20 DIAGNOSIS — D631 Anemia in chronic kidney disease: Secondary | ICD-10-CM | POA: Diagnosis not present

## 2018-04-20 DIAGNOSIS — N186 End stage renal disease: Secondary | ICD-10-CM | POA: Diagnosis not present

## 2018-04-20 DIAGNOSIS — D509 Iron deficiency anemia, unspecified: Secondary | ICD-10-CM | POA: Diagnosis not present

## 2018-04-20 DIAGNOSIS — Z4932 Encounter for adequacy testing for peritoneal dialysis: Secondary | ICD-10-CM | POA: Diagnosis not present

## 2018-04-21 DIAGNOSIS — N2581 Secondary hyperparathyroidism of renal origin: Secondary | ICD-10-CM | POA: Diagnosis not present

## 2018-04-21 DIAGNOSIS — D509 Iron deficiency anemia, unspecified: Secondary | ICD-10-CM | POA: Diagnosis not present

## 2018-04-21 DIAGNOSIS — N186 End stage renal disease: Secondary | ICD-10-CM | POA: Diagnosis not present

## 2018-04-21 DIAGNOSIS — R17 Unspecified jaundice: Secondary | ICD-10-CM | POA: Diagnosis not present

## 2018-04-21 DIAGNOSIS — D631 Anemia in chronic kidney disease: Secondary | ICD-10-CM | POA: Diagnosis not present

## 2018-04-21 DIAGNOSIS — Z4932 Encounter for adequacy testing for peritoneal dialysis: Secondary | ICD-10-CM | POA: Diagnosis not present

## 2018-04-22 DIAGNOSIS — R109 Unspecified abdominal pain: Secondary | ICD-10-CM | POA: Diagnosis not present

## 2018-04-22 DIAGNOSIS — I129 Hypertensive chronic kidney disease with stage 1 through stage 4 chronic kidney disease, or unspecified chronic kidney disease: Secondary | ICD-10-CM | POA: Diagnosis not present

## 2018-04-22 DIAGNOSIS — Z4932 Encounter for adequacy testing for peritoneal dialysis: Secondary | ICD-10-CM | POA: Diagnosis not present

## 2018-04-22 DIAGNOSIS — D631 Anemia in chronic kidney disease: Secondary | ICD-10-CM | POA: Diagnosis not present

## 2018-04-22 DIAGNOSIS — D509 Iron deficiency anemia, unspecified: Secondary | ICD-10-CM | POA: Diagnosis not present

## 2018-04-22 DIAGNOSIS — N2589 Other disorders resulting from impaired renal tubular function: Secondary | ICD-10-CM | POA: Diagnosis not present

## 2018-04-22 DIAGNOSIS — N2581 Secondary hyperparathyroidism of renal origin: Secondary | ICD-10-CM | POA: Diagnosis not present

## 2018-04-22 DIAGNOSIS — K650 Generalized (acute) peritonitis: Secondary | ICD-10-CM | POA: Diagnosis not present

## 2018-04-22 DIAGNOSIS — Z992 Dependence on renal dialysis: Secondary | ICD-10-CM | POA: Diagnosis not present

## 2018-04-22 DIAGNOSIS — N186 End stage renal disease: Secondary | ICD-10-CM | POA: Diagnosis not present

## 2018-04-23 ENCOUNTER — Other Ambulatory Visit: Payer: Self-pay | Admitting: Internal Medicine

## 2018-04-23 ENCOUNTER — Ambulatory Visit
Admission: RE | Admit: 2018-04-23 | Discharge: 2018-04-23 | Disposition: A | Discharge status: Home / Self Care | Payer: Medicare Other | Source: Ambulatory Visit | Attending: Internal Medicine | Admitting: Internal Medicine

## 2018-04-23 DIAGNOSIS — D509 Iron deficiency anemia, unspecified: Secondary | ICD-10-CM | POA: Diagnosis not present

## 2018-04-23 DIAGNOSIS — R109 Unspecified abdominal pain: Secondary | ICD-10-CM

## 2018-04-23 DIAGNOSIS — Z4932 Encounter for adequacy testing for peritoneal dialysis: Secondary | ICD-10-CM | POA: Diagnosis not present

## 2018-04-23 DIAGNOSIS — D631 Anemia in chronic kidney disease: Secondary | ICD-10-CM | POA: Diagnosis not present

## 2018-04-23 DIAGNOSIS — N2581 Secondary hyperparathyroidism of renal origin: Secondary | ICD-10-CM | POA: Diagnosis not present

## 2018-04-23 DIAGNOSIS — K650 Generalized (acute) peritonitis: Secondary | ICD-10-CM | POA: Diagnosis not present

## 2018-04-23 DIAGNOSIS — N186 End stage renal disease: Secondary | ICD-10-CM | POA: Diagnosis not present

## 2018-04-24 DIAGNOSIS — D631 Anemia in chronic kidney disease: Secondary | ICD-10-CM | POA: Diagnosis not present

## 2018-04-24 DIAGNOSIS — K650 Generalized (acute) peritonitis: Secondary | ICD-10-CM | POA: Diagnosis not present

## 2018-04-24 DIAGNOSIS — Z4932 Encounter for adequacy testing for peritoneal dialysis: Secondary | ICD-10-CM | POA: Diagnosis not present

## 2018-04-24 DIAGNOSIS — N2581 Secondary hyperparathyroidism of renal origin: Secondary | ICD-10-CM | POA: Diagnosis not present

## 2018-04-24 DIAGNOSIS — N186 End stage renal disease: Secondary | ICD-10-CM | POA: Diagnosis not present

## 2018-04-24 DIAGNOSIS — D509 Iron deficiency anemia, unspecified: Secondary | ICD-10-CM | POA: Diagnosis not present

## 2018-04-25 DIAGNOSIS — D631 Anemia in chronic kidney disease: Secondary | ICD-10-CM | POA: Diagnosis not present

## 2018-04-25 DIAGNOSIS — Z4932 Encounter for adequacy testing for peritoneal dialysis: Secondary | ICD-10-CM | POA: Diagnosis not present

## 2018-04-25 DIAGNOSIS — K650 Generalized (acute) peritonitis: Secondary | ICD-10-CM | POA: Diagnosis not present

## 2018-04-25 DIAGNOSIS — N2581 Secondary hyperparathyroidism of renal origin: Secondary | ICD-10-CM | POA: Diagnosis not present

## 2018-04-25 DIAGNOSIS — D509 Iron deficiency anemia, unspecified: Secondary | ICD-10-CM | POA: Diagnosis not present

## 2018-04-25 DIAGNOSIS — N186 End stage renal disease: Secondary | ICD-10-CM | POA: Diagnosis not present

## 2018-04-26 DIAGNOSIS — N2581 Secondary hyperparathyroidism of renal origin: Secondary | ICD-10-CM | POA: Diagnosis not present

## 2018-04-26 DIAGNOSIS — Z4932 Encounter for adequacy testing for peritoneal dialysis: Secondary | ICD-10-CM | POA: Diagnosis not present

## 2018-04-26 DIAGNOSIS — K650 Generalized (acute) peritonitis: Secondary | ICD-10-CM | POA: Diagnosis not present

## 2018-04-26 DIAGNOSIS — D631 Anemia in chronic kidney disease: Secondary | ICD-10-CM | POA: Diagnosis not present

## 2018-04-26 DIAGNOSIS — N186 End stage renal disease: Secondary | ICD-10-CM | POA: Diagnosis not present

## 2018-04-26 DIAGNOSIS — D509 Iron deficiency anemia, unspecified: Secondary | ICD-10-CM | POA: Diagnosis not present

## 2018-04-27 DIAGNOSIS — N186 End stage renal disease: Secondary | ICD-10-CM | POA: Diagnosis not present

## 2018-04-27 DIAGNOSIS — D509 Iron deficiency anemia, unspecified: Secondary | ICD-10-CM | POA: Diagnosis not present

## 2018-04-27 DIAGNOSIS — Z4932 Encounter for adequacy testing for peritoneal dialysis: Secondary | ICD-10-CM | POA: Diagnosis not present

## 2018-04-27 DIAGNOSIS — N2581 Secondary hyperparathyroidism of renal origin: Secondary | ICD-10-CM | POA: Diagnosis not present

## 2018-04-27 DIAGNOSIS — D631 Anemia in chronic kidney disease: Secondary | ICD-10-CM | POA: Diagnosis not present

## 2018-04-27 DIAGNOSIS — K650 Generalized (acute) peritonitis: Secondary | ICD-10-CM | POA: Diagnosis not present

## 2018-04-28 DIAGNOSIS — N2581 Secondary hyperparathyroidism of renal origin: Secondary | ICD-10-CM | POA: Diagnosis not present

## 2018-04-28 DIAGNOSIS — D631 Anemia in chronic kidney disease: Secondary | ICD-10-CM | POA: Diagnosis not present

## 2018-04-28 DIAGNOSIS — K650 Generalized (acute) peritonitis: Secondary | ICD-10-CM | POA: Diagnosis not present

## 2018-04-28 DIAGNOSIS — Z4932 Encounter for adequacy testing for peritoneal dialysis: Secondary | ICD-10-CM | POA: Diagnosis not present

## 2018-04-28 DIAGNOSIS — N186 End stage renal disease: Secondary | ICD-10-CM | POA: Diagnosis not present

## 2018-04-28 DIAGNOSIS — D509 Iron deficiency anemia, unspecified: Secondary | ICD-10-CM | POA: Diagnosis not present

## 2018-04-29 DIAGNOSIS — N2581 Secondary hyperparathyroidism of renal origin: Secondary | ICD-10-CM | POA: Diagnosis not present

## 2018-04-29 DIAGNOSIS — N186 End stage renal disease: Secondary | ICD-10-CM | POA: Diagnosis not present

## 2018-04-29 DIAGNOSIS — D509 Iron deficiency anemia, unspecified: Secondary | ICD-10-CM | POA: Diagnosis not present

## 2018-04-29 DIAGNOSIS — Z4932 Encounter for adequacy testing for peritoneal dialysis: Secondary | ICD-10-CM | POA: Diagnosis not present

## 2018-04-29 DIAGNOSIS — D631 Anemia in chronic kidney disease: Secondary | ICD-10-CM | POA: Diagnosis not present

## 2018-04-29 DIAGNOSIS — K650 Generalized (acute) peritonitis: Secondary | ICD-10-CM | POA: Diagnosis not present

## 2018-04-30 DIAGNOSIS — Z4932 Encounter for adequacy testing for peritoneal dialysis: Secondary | ICD-10-CM | POA: Diagnosis not present

## 2018-04-30 DIAGNOSIS — K650 Generalized (acute) peritonitis: Secondary | ICD-10-CM | POA: Diagnosis not present

## 2018-04-30 DIAGNOSIS — N186 End stage renal disease: Secondary | ICD-10-CM | POA: Diagnosis not present

## 2018-04-30 DIAGNOSIS — N2581 Secondary hyperparathyroidism of renal origin: Secondary | ICD-10-CM | POA: Diagnosis not present

## 2018-04-30 DIAGNOSIS — D509 Iron deficiency anemia, unspecified: Secondary | ICD-10-CM | POA: Diagnosis not present

## 2018-04-30 DIAGNOSIS — D631 Anemia in chronic kidney disease: Secondary | ICD-10-CM | POA: Diagnosis not present

## 2018-05-01 DIAGNOSIS — N2581 Secondary hyperparathyroidism of renal origin: Secondary | ICD-10-CM | POA: Diagnosis not present

## 2018-05-01 DIAGNOSIS — D631 Anemia in chronic kidney disease: Secondary | ICD-10-CM | POA: Diagnosis not present

## 2018-05-01 DIAGNOSIS — N186 End stage renal disease: Secondary | ICD-10-CM | POA: Diagnosis not present

## 2018-05-01 DIAGNOSIS — Z4932 Encounter for adequacy testing for peritoneal dialysis: Secondary | ICD-10-CM | POA: Diagnosis not present

## 2018-05-01 DIAGNOSIS — K650 Generalized (acute) peritonitis: Secondary | ICD-10-CM | POA: Diagnosis not present

## 2018-05-01 DIAGNOSIS — D509 Iron deficiency anemia, unspecified: Secondary | ICD-10-CM | POA: Diagnosis not present

## 2018-05-02 DIAGNOSIS — D631 Anemia in chronic kidney disease: Secondary | ICD-10-CM | POA: Diagnosis not present

## 2018-05-02 DIAGNOSIS — D509 Iron deficiency anemia, unspecified: Secondary | ICD-10-CM | POA: Diagnosis not present

## 2018-05-02 DIAGNOSIS — Z4932 Encounter for adequacy testing for peritoneal dialysis: Secondary | ICD-10-CM | POA: Diagnosis not present

## 2018-05-02 DIAGNOSIS — K650 Generalized (acute) peritonitis: Secondary | ICD-10-CM | POA: Diagnosis not present

## 2018-05-02 DIAGNOSIS — N186 End stage renal disease: Secondary | ICD-10-CM | POA: Diagnosis not present

## 2018-05-02 DIAGNOSIS — N2581 Secondary hyperparathyroidism of renal origin: Secondary | ICD-10-CM | POA: Diagnosis not present

## 2018-05-03 DIAGNOSIS — Z4932 Encounter for adequacy testing for peritoneal dialysis: Secondary | ICD-10-CM | POA: Diagnosis not present

## 2018-05-03 DIAGNOSIS — N2581 Secondary hyperparathyroidism of renal origin: Secondary | ICD-10-CM | POA: Diagnosis not present

## 2018-05-03 DIAGNOSIS — K650 Generalized (acute) peritonitis: Secondary | ICD-10-CM | POA: Diagnosis not present

## 2018-05-03 DIAGNOSIS — D509 Iron deficiency anemia, unspecified: Secondary | ICD-10-CM | POA: Diagnosis not present

## 2018-05-03 DIAGNOSIS — N186 End stage renal disease: Secondary | ICD-10-CM | POA: Diagnosis not present

## 2018-05-03 DIAGNOSIS — D631 Anemia in chronic kidney disease: Secondary | ICD-10-CM | POA: Diagnosis not present

## 2018-05-04 DIAGNOSIS — Z4932 Encounter for adequacy testing for peritoneal dialysis: Secondary | ICD-10-CM | POA: Diagnosis not present

## 2018-05-04 DIAGNOSIS — N2581 Secondary hyperparathyroidism of renal origin: Secondary | ICD-10-CM | POA: Diagnosis not present

## 2018-05-04 DIAGNOSIS — D509 Iron deficiency anemia, unspecified: Secondary | ICD-10-CM | POA: Diagnosis not present

## 2018-05-04 DIAGNOSIS — K650 Generalized (acute) peritonitis: Secondary | ICD-10-CM | POA: Diagnosis not present

## 2018-05-04 DIAGNOSIS — D631 Anemia in chronic kidney disease: Secondary | ICD-10-CM | POA: Diagnosis not present

## 2018-05-04 DIAGNOSIS — N186 End stage renal disease: Secondary | ICD-10-CM | POA: Diagnosis not present

## 2018-05-05 DIAGNOSIS — E1129 Type 2 diabetes mellitus with other diabetic kidney complication: Secondary | ICD-10-CM | POA: Diagnosis not present

## 2018-05-05 DIAGNOSIS — R82994 Hypercalciuria: Secondary | ICD-10-CM | POA: Diagnosis not present

## 2018-05-05 DIAGNOSIS — D509 Iron deficiency anemia, unspecified: Secondary | ICD-10-CM | POA: Diagnosis not present

## 2018-05-05 DIAGNOSIS — N2581 Secondary hyperparathyroidism of renal origin: Secondary | ICD-10-CM | POA: Diagnosis not present

## 2018-05-05 DIAGNOSIS — Z4932 Encounter for adequacy testing for peritoneal dialysis: Secondary | ICD-10-CM | POA: Diagnosis not present

## 2018-05-05 DIAGNOSIS — E7849 Other hyperlipidemia: Secondary | ICD-10-CM | POA: Diagnosis not present

## 2018-05-05 DIAGNOSIS — K650 Generalized (acute) peritonitis: Secondary | ICD-10-CM | POA: Diagnosis not present

## 2018-05-05 DIAGNOSIS — D631 Anemia in chronic kidney disease: Secondary | ICD-10-CM | POA: Diagnosis not present

## 2018-05-05 DIAGNOSIS — N186 End stage renal disease: Secondary | ICD-10-CM | POA: Diagnosis not present

## 2018-05-06 DIAGNOSIS — N186 End stage renal disease: Secondary | ICD-10-CM | POA: Diagnosis not present

## 2018-05-06 DIAGNOSIS — Z4932 Encounter for adequacy testing for peritoneal dialysis: Secondary | ICD-10-CM | POA: Diagnosis not present

## 2018-05-06 DIAGNOSIS — K650 Generalized (acute) peritonitis: Secondary | ICD-10-CM | POA: Diagnosis not present

## 2018-05-06 DIAGNOSIS — N2581 Secondary hyperparathyroidism of renal origin: Secondary | ICD-10-CM | POA: Diagnosis not present

## 2018-05-06 DIAGNOSIS — D631 Anemia in chronic kidney disease: Secondary | ICD-10-CM | POA: Diagnosis not present

## 2018-05-06 DIAGNOSIS — D509 Iron deficiency anemia, unspecified: Secondary | ICD-10-CM | POA: Diagnosis not present

## 2018-05-07 DIAGNOSIS — D631 Anemia in chronic kidney disease: Secondary | ICD-10-CM | POA: Diagnosis not present

## 2018-05-07 DIAGNOSIS — Z4932 Encounter for adequacy testing for peritoneal dialysis: Secondary | ICD-10-CM | POA: Diagnosis not present

## 2018-05-07 DIAGNOSIS — K650 Generalized (acute) peritonitis: Secondary | ICD-10-CM | POA: Diagnosis not present

## 2018-05-07 DIAGNOSIS — D509 Iron deficiency anemia, unspecified: Secondary | ICD-10-CM | POA: Diagnosis not present

## 2018-05-07 DIAGNOSIS — N2581 Secondary hyperparathyroidism of renal origin: Secondary | ICD-10-CM | POA: Diagnosis not present

## 2018-05-07 DIAGNOSIS — N186 End stage renal disease: Secondary | ICD-10-CM | POA: Diagnosis not present

## 2018-05-08 DIAGNOSIS — D631 Anemia in chronic kidney disease: Secondary | ICD-10-CM | POA: Diagnosis not present

## 2018-05-08 DIAGNOSIS — N2581 Secondary hyperparathyroidism of renal origin: Secondary | ICD-10-CM | POA: Diagnosis not present

## 2018-05-08 DIAGNOSIS — N186 End stage renal disease: Secondary | ICD-10-CM | POA: Diagnosis not present

## 2018-05-08 DIAGNOSIS — Z4932 Encounter for adequacy testing for peritoneal dialysis: Secondary | ICD-10-CM | POA: Diagnosis not present

## 2018-05-08 DIAGNOSIS — D509 Iron deficiency anemia, unspecified: Secondary | ICD-10-CM | POA: Diagnosis not present

## 2018-05-08 DIAGNOSIS — K650 Generalized (acute) peritonitis: Secondary | ICD-10-CM | POA: Diagnosis not present

## 2018-05-09 DIAGNOSIS — D631 Anemia in chronic kidney disease: Secondary | ICD-10-CM | POA: Diagnosis not present

## 2018-05-09 DIAGNOSIS — Z4932 Encounter for adequacy testing for peritoneal dialysis: Secondary | ICD-10-CM | POA: Diagnosis not present

## 2018-05-09 DIAGNOSIS — Z880 Allergy status to penicillin: Secondary | ICD-10-CM | POA: Diagnosis not present

## 2018-05-09 DIAGNOSIS — K650 Generalized (acute) peritonitis: Secondary | ICD-10-CM | POA: Diagnosis not present

## 2018-05-09 DIAGNOSIS — Z524 Kidney donor: Secondary | ICD-10-CM | POA: Diagnosis not present

## 2018-05-09 DIAGNOSIS — N2581 Secondary hyperparathyroidism of renal origin: Secondary | ICD-10-CM | POA: Diagnosis not present

## 2018-05-09 DIAGNOSIS — Z881 Allergy status to other antibiotic agents status: Secondary | ICD-10-CM | POA: Diagnosis not present

## 2018-05-09 DIAGNOSIS — D509 Iron deficiency anemia, unspecified: Secondary | ICD-10-CM | POA: Diagnosis not present

## 2018-05-09 DIAGNOSIS — N186 End stage renal disease: Secondary | ICD-10-CM | POA: Diagnosis not present

## 2018-05-10 DIAGNOSIS — D631 Anemia in chronic kidney disease: Secondary | ICD-10-CM | POA: Diagnosis not present

## 2018-05-10 DIAGNOSIS — K650 Generalized (acute) peritonitis: Secondary | ICD-10-CM | POA: Diagnosis not present

## 2018-05-10 DIAGNOSIS — D509 Iron deficiency anemia, unspecified: Secondary | ICD-10-CM | POA: Diagnosis not present

## 2018-05-10 DIAGNOSIS — Z4932 Encounter for adequacy testing for peritoneal dialysis: Secondary | ICD-10-CM | POA: Diagnosis not present

## 2018-05-10 DIAGNOSIS — N186 End stage renal disease: Secondary | ICD-10-CM | POA: Diagnosis not present

## 2018-05-10 DIAGNOSIS — N2581 Secondary hyperparathyroidism of renal origin: Secondary | ICD-10-CM | POA: Diagnosis not present

## 2018-05-11 DIAGNOSIS — D509 Iron deficiency anemia, unspecified: Secondary | ICD-10-CM | POA: Diagnosis not present

## 2018-05-11 DIAGNOSIS — N2581 Secondary hyperparathyroidism of renal origin: Secondary | ICD-10-CM | POA: Diagnosis not present

## 2018-05-11 DIAGNOSIS — D631 Anemia in chronic kidney disease: Secondary | ICD-10-CM | POA: Diagnosis not present

## 2018-05-11 DIAGNOSIS — N186 End stage renal disease: Secondary | ICD-10-CM | POA: Diagnosis not present

## 2018-05-11 DIAGNOSIS — K650 Generalized (acute) peritonitis: Secondary | ICD-10-CM | POA: Diagnosis not present

## 2018-05-11 DIAGNOSIS — Z4932 Encounter for adequacy testing for peritoneal dialysis: Secondary | ICD-10-CM | POA: Diagnosis not present

## 2018-05-12 DIAGNOSIS — D509 Iron deficiency anemia, unspecified: Secondary | ICD-10-CM | POA: Diagnosis not present

## 2018-05-12 DIAGNOSIS — D631 Anemia in chronic kidney disease: Secondary | ICD-10-CM | POA: Diagnosis not present

## 2018-05-12 DIAGNOSIS — N2581 Secondary hyperparathyroidism of renal origin: Secondary | ICD-10-CM | POA: Diagnosis not present

## 2018-05-12 DIAGNOSIS — K650 Generalized (acute) peritonitis: Secondary | ICD-10-CM | POA: Diagnosis not present

## 2018-05-12 DIAGNOSIS — Z4932 Encounter for adequacy testing for peritoneal dialysis: Secondary | ICD-10-CM | POA: Diagnosis not present

## 2018-05-12 DIAGNOSIS — N186 End stage renal disease: Secondary | ICD-10-CM | POA: Diagnosis not present

## 2018-05-13 DIAGNOSIS — D631 Anemia in chronic kidney disease: Secondary | ICD-10-CM | POA: Diagnosis not present

## 2018-05-13 DIAGNOSIS — N186 End stage renal disease: Secondary | ICD-10-CM | POA: Diagnosis not present

## 2018-05-13 DIAGNOSIS — K650 Generalized (acute) peritonitis: Secondary | ICD-10-CM | POA: Diagnosis not present

## 2018-05-13 DIAGNOSIS — N2581 Secondary hyperparathyroidism of renal origin: Secondary | ICD-10-CM | POA: Diagnosis not present

## 2018-05-13 DIAGNOSIS — D509 Iron deficiency anemia, unspecified: Secondary | ICD-10-CM | POA: Diagnosis not present

## 2018-05-13 DIAGNOSIS — Z4932 Encounter for adequacy testing for peritoneal dialysis: Secondary | ICD-10-CM | POA: Diagnosis not present

## 2018-05-14 DIAGNOSIS — K650 Generalized (acute) peritonitis: Secondary | ICD-10-CM | POA: Diagnosis not present

## 2018-05-14 DIAGNOSIS — N2581 Secondary hyperparathyroidism of renal origin: Secondary | ICD-10-CM | POA: Diagnosis not present

## 2018-05-14 DIAGNOSIS — N186 End stage renal disease: Secondary | ICD-10-CM | POA: Diagnosis not present

## 2018-05-14 DIAGNOSIS — D509 Iron deficiency anemia, unspecified: Secondary | ICD-10-CM | POA: Diagnosis not present

## 2018-05-14 DIAGNOSIS — D631 Anemia in chronic kidney disease: Secondary | ICD-10-CM | POA: Diagnosis not present

## 2018-05-14 DIAGNOSIS — Z4932 Encounter for adequacy testing for peritoneal dialysis: Secondary | ICD-10-CM | POA: Diagnosis not present

## 2018-05-15 DIAGNOSIS — Z4932 Encounter for adequacy testing for peritoneal dialysis: Secondary | ICD-10-CM | POA: Diagnosis not present

## 2018-05-15 DIAGNOSIS — K650 Generalized (acute) peritonitis: Secondary | ICD-10-CM | POA: Diagnosis not present

## 2018-05-15 DIAGNOSIS — D631 Anemia in chronic kidney disease: Secondary | ICD-10-CM | POA: Diagnosis not present

## 2018-05-15 DIAGNOSIS — N2581 Secondary hyperparathyroidism of renal origin: Secondary | ICD-10-CM | POA: Diagnosis not present

## 2018-05-15 DIAGNOSIS — R109 Unspecified abdominal pain: Secondary | ICD-10-CM | POA: Diagnosis not present

## 2018-05-15 DIAGNOSIS — N186 End stage renal disease: Secondary | ICD-10-CM | POA: Diagnosis not present

## 2018-05-15 DIAGNOSIS — D509 Iron deficiency anemia, unspecified: Secondary | ICD-10-CM | POA: Diagnosis not present

## 2018-05-16 ENCOUNTER — Other Ambulatory Visit: Payer: Self-pay | Admitting: Nephrology

## 2018-05-16 ENCOUNTER — Ambulatory Visit
Admission: RE | Admit: 2018-05-16 | Discharge: 2018-05-16 | Disposition: A | Discharge status: Home / Self Care | Payer: Medicare Other | Source: Ambulatory Visit | Attending: Nephrology | Admitting: Nephrology

## 2018-05-16 DIAGNOSIS — K5730 Diverticulosis of large intestine without perforation or abscess without bleeding: Secondary | ICD-10-CM | POA: Diagnosis not present

## 2018-05-16 DIAGNOSIS — N186 End stage renal disease: Secondary | ICD-10-CM | POA: Diagnosis not present

## 2018-05-16 DIAGNOSIS — K650 Generalized (acute) peritonitis: Secondary | ICD-10-CM | POA: Diagnosis not present

## 2018-05-16 DIAGNOSIS — D509 Iron deficiency anemia, unspecified: Secondary | ICD-10-CM | POA: Diagnosis not present

## 2018-05-16 DIAGNOSIS — N2581 Secondary hyperparathyroidism of renal origin: Secondary | ICD-10-CM | POA: Diagnosis not present

## 2018-05-16 DIAGNOSIS — M549 Dorsalgia, unspecified: Secondary | ICD-10-CM

## 2018-05-16 DIAGNOSIS — Z4932 Encounter for adequacy testing for peritoneal dialysis: Secondary | ICD-10-CM | POA: Diagnosis not present

## 2018-05-16 DIAGNOSIS — D631 Anemia in chronic kidney disease: Secondary | ICD-10-CM | POA: Diagnosis not present

## 2018-05-17 DIAGNOSIS — Z4932 Encounter for adequacy testing for peritoneal dialysis: Secondary | ICD-10-CM | POA: Diagnosis not present

## 2018-05-17 DIAGNOSIS — N2581 Secondary hyperparathyroidism of renal origin: Secondary | ICD-10-CM | POA: Diagnosis not present

## 2018-05-17 DIAGNOSIS — K650 Generalized (acute) peritonitis: Secondary | ICD-10-CM | POA: Diagnosis not present

## 2018-05-17 DIAGNOSIS — N186 End stage renal disease: Secondary | ICD-10-CM | POA: Diagnosis not present

## 2018-05-17 DIAGNOSIS — D509 Iron deficiency anemia, unspecified: Secondary | ICD-10-CM | POA: Diagnosis not present

## 2018-05-17 DIAGNOSIS — D631 Anemia in chronic kidney disease: Secondary | ICD-10-CM | POA: Diagnosis not present

## 2018-05-18 DIAGNOSIS — K650 Generalized (acute) peritonitis: Secondary | ICD-10-CM | POA: Diagnosis not present

## 2018-05-18 DIAGNOSIS — N2581 Secondary hyperparathyroidism of renal origin: Secondary | ICD-10-CM | POA: Diagnosis not present

## 2018-05-18 DIAGNOSIS — N186 End stage renal disease: Secondary | ICD-10-CM | POA: Diagnosis not present

## 2018-05-18 DIAGNOSIS — D509 Iron deficiency anemia, unspecified: Secondary | ICD-10-CM | POA: Diagnosis not present

## 2018-05-18 DIAGNOSIS — D631 Anemia in chronic kidney disease: Secondary | ICD-10-CM | POA: Diagnosis not present

## 2018-05-18 DIAGNOSIS — Z4932 Encounter for adequacy testing for peritoneal dialysis: Secondary | ICD-10-CM | POA: Diagnosis not present

## 2018-05-19 ENCOUNTER — Other Ambulatory Visit: Payer: Self-pay | Admitting: Nephrology

## 2018-05-19 DIAGNOSIS — D509 Iron deficiency anemia, unspecified: Secondary | ICD-10-CM | POA: Diagnosis not present

## 2018-05-19 DIAGNOSIS — Z4932 Encounter for adequacy testing for peritoneal dialysis: Secondary | ICD-10-CM | POA: Diagnosis not present

## 2018-05-19 DIAGNOSIS — K650 Generalized (acute) peritonitis: Secondary | ICD-10-CM | POA: Diagnosis not present

## 2018-05-19 DIAGNOSIS — N2581 Secondary hyperparathyroidism of renal origin: Secondary | ICD-10-CM | POA: Diagnosis not present

## 2018-05-19 DIAGNOSIS — D631 Anemia in chronic kidney disease: Secondary | ICD-10-CM | POA: Diagnosis not present

## 2018-05-19 DIAGNOSIS — N186 End stage renal disease: Secondary | ICD-10-CM | POA: Diagnosis not present

## 2018-05-20 DIAGNOSIS — N186 End stage renal disease: Secondary | ICD-10-CM | POA: Diagnosis not present

## 2018-05-20 DIAGNOSIS — N2581 Secondary hyperparathyroidism of renal origin: Secondary | ICD-10-CM | POA: Diagnosis not present

## 2018-05-20 DIAGNOSIS — Z4932 Encounter for adequacy testing for peritoneal dialysis: Secondary | ICD-10-CM | POA: Diagnosis not present

## 2018-05-20 DIAGNOSIS — D509 Iron deficiency anemia, unspecified: Secondary | ICD-10-CM | POA: Diagnosis not present

## 2018-05-20 DIAGNOSIS — D631 Anemia in chronic kidney disease: Secondary | ICD-10-CM | POA: Diagnosis not present

## 2018-05-20 DIAGNOSIS — K650 Generalized (acute) peritonitis: Secondary | ICD-10-CM | POA: Diagnosis not present

## 2018-05-21 DIAGNOSIS — N186 End stage renal disease: Secondary | ICD-10-CM | POA: Diagnosis not present

## 2018-05-21 DIAGNOSIS — K650 Generalized (acute) peritonitis: Secondary | ICD-10-CM | POA: Diagnosis not present

## 2018-05-21 DIAGNOSIS — D509 Iron deficiency anemia, unspecified: Secondary | ICD-10-CM | POA: Diagnosis not present

## 2018-05-21 DIAGNOSIS — N2581 Secondary hyperparathyroidism of renal origin: Secondary | ICD-10-CM | POA: Diagnosis not present

## 2018-05-21 DIAGNOSIS — Z4932 Encounter for adequacy testing for peritoneal dialysis: Secondary | ICD-10-CM | POA: Diagnosis not present

## 2018-05-21 DIAGNOSIS — D631 Anemia in chronic kidney disease: Secondary | ICD-10-CM | POA: Diagnosis not present

## 2018-05-22 DIAGNOSIS — N186 End stage renal disease: Secondary | ICD-10-CM | POA: Diagnosis not present

## 2018-05-22 DIAGNOSIS — Z4932 Encounter for adequacy testing for peritoneal dialysis: Secondary | ICD-10-CM | POA: Diagnosis not present

## 2018-05-22 DIAGNOSIS — K650 Generalized (acute) peritonitis: Secondary | ICD-10-CM | POA: Diagnosis not present

## 2018-05-22 DIAGNOSIS — D509 Iron deficiency anemia, unspecified: Secondary | ICD-10-CM | POA: Diagnosis not present

## 2018-05-22 DIAGNOSIS — D631 Anemia in chronic kidney disease: Secondary | ICD-10-CM | POA: Diagnosis not present

## 2018-05-22 DIAGNOSIS — N2581 Secondary hyperparathyroidism of renal origin: Secondary | ICD-10-CM | POA: Diagnosis not present

## 2018-05-23 DIAGNOSIS — D509 Iron deficiency anemia, unspecified: Secondary | ICD-10-CM | POA: Diagnosis not present

## 2018-05-23 DIAGNOSIS — D631 Anemia in chronic kidney disease: Secondary | ICD-10-CM | POA: Diagnosis not present

## 2018-05-23 DIAGNOSIS — R82994 Hypercalciuria: Secondary | ICD-10-CM | POA: Diagnosis not present

## 2018-05-23 DIAGNOSIS — Z4932 Encounter for adequacy testing for peritoneal dialysis: Secondary | ICD-10-CM | POA: Diagnosis not present

## 2018-05-23 DIAGNOSIS — R17 Unspecified jaundice: Secondary | ICD-10-CM | POA: Diagnosis not present

## 2018-05-23 DIAGNOSIS — Z79899 Other long term (current) drug therapy: Secondary | ICD-10-CM | POA: Diagnosis not present

## 2018-05-23 DIAGNOSIS — N186 End stage renal disease: Secondary | ICD-10-CM | POA: Diagnosis not present

## 2018-05-23 DIAGNOSIS — I129 Hypertensive chronic kidney disease with stage 1 through stage 4 chronic kidney disease, or unspecified chronic kidney disease: Secondary | ICD-10-CM | POA: Diagnosis not present

## 2018-05-23 DIAGNOSIS — Z992 Dependence on renal dialysis: Secondary | ICD-10-CM | POA: Diagnosis not present

## 2018-05-23 DIAGNOSIS — N2581 Secondary hyperparathyroidism of renal origin: Secondary | ICD-10-CM | POA: Diagnosis not present

## 2018-05-23 DIAGNOSIS — E440 Moderate protein-calorie malnutrition: Secondary | ICD-10-CM | POA: Diagnosis not present

## 2018-05-24 DIAGNOSIS — E440 Moderate protein-calorie malnutrition: Secondary | ICD-10-CM | POA: Diagnosis not present

## 2018-05-24 DIAGNOSIS — N2581 Secondary hyperparathyroidism of renal origin: Secondary | ICD-10-CM | POA: Diagnosis not present

## 2018-05-24 DIAGNOSIS — N186 End stage renal disease: Secondary | ICD-10-CM | POA: Diagnosis not present

## 2018-05-24 DIAGNOSIS — Z79899 Other long term (current) drug therapy: Secondary | ICD-10-CM | POA: Diagnosis not present

## 2018-05-24 DIAGNOSIS — D631 Anemia in chronic kidney disease: Secondary | ICD-10-CM | POA: Diagnosis not present

## 2018-05-24 DIAGNOSIS — R17 Unspecified jaundice: Secondary | ICD-10-CM | POA: Diagnosis not present

## 2018-05-25 DIAGNOSIS — R17 Unspecified jaundice: Secondary | ICD-10-CM | POA: Diagnosis not present

## 2018-05-25 DIAGNOSIS — Z79899 Other long term (current) drug therapy: Secondary | ICD-10-CM | POA: Diagnosis not present

## 2018-05-25 DIAGNOSIS — D631 Anemia in chronic kidney disease: Secondary | ICD-10-CM | POA: Diagnosis not present

## 2018-05-25 DIAGNOSIS — E440 Moderate protein-calorie malnutrition: Secondary | ICD-10-CM | POA: Diagnosis not present

## 2018-05-25 DIAGNOSIS — N2581 Secondary hyperparathyroidism of renal origin: Secondary | ICD-10-CM | POA: Diagnosis not present

## 2018-05-25 DIAGNOSIS — N186 End stage renal disease: Secondary | ICD-10-CM | POA: Diagnosis not present

## 2018-05-26 DIAGNOSIS — D631 Anemia in chronic kidney disease: Secondary | ICD-10-CM | POA: Diagnosis not present

## 2018-05-26 DIAGNOSIS — Z79899 Other long term (current) drug therapy: Secondary | ICD-10-CM | POA: Diagnosis not present

## 2018-05-26 DIAGNOSIS — N186 End stage renal disease: Secondary | ICD-10-CM | POA: Diagnosis not present

## 2018-05-26 DIAGNOSIS — R17 Unspecified jaundice: Secondary | ICD-10-CM | POA: Diagnosis not present

## 2018-05-26 DIAGNOSIS — N2581 Secondary hyperparathyroidism of renal origin: Secondary | ICD-10-CM | POA: Diagnosis not present

## 2018-05-26 DIAGNOSIS — E440 Moderate protein-calorie malnutrition: Secondary | ICD-10-CM | POA: Diagnosis not present

## 2018-05-27 DIAGNOSIS — N2581 Secondary hyperparathyroidism of renal origin: Secondary | ICD-10-CM | POA: Diagnosis not present

## 2018-05-27 DIAGNOSIS — E440 Moderate protein-calorie malnutrition: Secondary | ICD-10-CM | POA: Diagnosis not present

## 2018-05-27 DIAGNOSIS — R17 Unspecified jaundice: Secondary | ICD-10-CM | POA: Diagnosis not present

## 2018-05-27 DIAGNOSIS — N186 End stage renal disease: Secondary | ICD-10-CM | POA: Diagnosis not present

## 2018-05-27 DIAGNOSIS — Z79899 Other long term (current) drug therapy: Secondary | ICD-10-CM | POA: Diagnosis not present

## 2018-05-27 DIAGNOSIS — D631 Anemia in chronic kidney disease: Secondary | ICD-10-CM | POA: Diagnosis not present

## 2018-05-28 DIAGNOSIS — N186 End stage renal disease: Secondary | ICD-10-CM | POA: Diagnosis not present

## 2018-05-28 DIAGNOSIS — E440 Moderate protein-calorie malnutrition: Secondary | ICD-10-CM | POA: Diagnosis not present

## 2018-05-28 DIAGNOSIS — D631 Anemia in chronic kidney disease: Secondary | ICD-10-CM | POA: Diagnosis not present

## 2018-05-28 DIAGNOSIS — Z79899 Other long term (current) drug therapy: Secondary | ICD-10-CM | POA: Diagnosis not present

## 2018-05-28 DIAGNOSIS — N2581 Secondary hyperparathyroidism of renal origin: Secondary | ICD-10-CM | POA: Diagnosis not present

## 2018-05-28 DIAGNOSIS — R17 Unspecified jaundice: Secondary | ICD-10-CM | POA: Diagnosis not present

## 2018-05-29 DIAGNOSIS — D631 Anemia in chronic kidney disease: Secondary | ICD-10-CM | POA: Diagnosis not present

## 2018-05-29 DIAGNOSIS — R17 Unspecified jaundice: Secondary | ICD-10-CM | POA: Diagnosis not present

## 2018-05-29 DIAGNOSIS — N2581 Secondary hyperparathyroidism of renal origin: Secondary | ICD-10-CM | POA: Diagnosis not present

## 2018-05-29 DIAGNOSIS — Z79899 Other long term (current) drug therapy: Secondary | ICD-10-CM | POA: Diagnosis not present

## 2018-05-29 DIAGNOSIS — E440 Moderate protein-calorie malnutrition: Secondary | ICD-10-CM | POA: Diagnosis not present

## 2018-05-29 DIAGNOSIS — N186 End stage renal disease: Secondary | ICD-10-CM | POA: Diagnosis not present

## 2018-05-30 DIAGNOSIS — N2581 Secondary hyperparathyroidism of renal origin: Secondary | ICD-10-CM | POA: Diagnosis not present

## 2018-05-30 DIAGNOSIS — E440 Moderate protein-calorie malnutrition: Secondary | ICD-10-CM | POA: Diagnosis not present

## 2018-05-30 DIAGNOSIS — N186 End stage renal disease: Secondary | ICD-10-CM | POA: Diagnosis not present

## 2018-05-30 DIAGNOSIS — D631 Anemia in chronic kidney disease: Secondary | ICD-10-CM | POA: Diagnosis not present

## 2018-05-30 DIAGNOSIS — Z79899 Other long term (current) drug therapy: Secondary | ICD-10-CM | POA: Diagnosis not present

## 2018-05-30 DIAGNOSIS — R17 Unspecified jaundice: Secondary | ICD-10-CM | POA: Diagnosis not present

## 2018-05-31 DIAGNOSIS — N2581 Secondary hyperparathyroidism of renal origin: Secondary | ICD-10-CM | POA: Diagnosis not present

## 2018-05-31 DIAGNOSIS — Z79899 Other long term (current) drug therapy: Secondary | ICD-10-CM | POA: Diagnosis not present

## 2018-05-31 DIAGNOSIS — D631 Anemia in chronic kidney disease: Secondary | ICD-10-CM | POA: Diagnosis not present

## 2018-05-31 DIAGNOSIS — E440 Moderate protein-calorie malnutrition: Secondary | ICD-10-CM | POA: Diagnosis not present

## 2018-05-31 DIAGNOSIS — R17 Unspecified jaundice: Secondary | ICD-10-CM | POA: Diagnosis not present

## 2018-05-31 DIAGNOSIS — N186 End stage renal disease: Secondary | ICD-10-CM | POA: Diagnosis not present

## 2018-06-01 DIAGNOSIS — Z79899 Other long term (current) drug therapy: Secondary | ICD-10-CM | POA: Diagnosis not present

## 2018-06-01 DIAGNOSIS — E440 Moderate protein-calorie malnutrition: Secondary | ICD-10-CM | POA: Diagnosis not present

## 2018-06-01 DIAGNOSIS — N186 End stage renal disease: Secondary | ICD-10-CM | POA: Diagnosis not present

## 2018-06-01 DIAGNOSIS — R17 Unspecified jaundice: Secondary | ICD-10-CM | POA: Diagnosis not present

## 2018-06-01 DIAGNOSIS — D631 Anemia in chronic kidney disease: Secondary | ICD-10-CM | POA: Diagnosis not present

## 2018-06-01 DIAGNOSIS — N2581 Secondary hyperparathyroidism of renal origin: Secondary | ICD-10-CM | POA: Diagnosis not present

## 2018-06-02 DIAGNOSIS — R17 Unspecified jaundice: Secondary | ICD-10-CM | POA: Diagnosis not present

## 2018-06-02 DIAGNOSIS — N186 End stage renal disease: Secondary | ICD-10-CM | POA: Diagnosis not present

## 2018-06-02 DIAGNOSIS — Z79899 Other long term (current) drug therapy: Secondary | ICD-10-CM | POA: Diagnosis not present

## 2018-06-02 DIAGNOSIS — D631 Anemia in chronic kidney disease: Secondary | ICD-10-CM | POA: Diagnosis not present

## 2018-06-02 DIAGNOSIS — E440 Moderate protein-calorie malnutrition: Secondary | ICD-10-CM | POA: Diagnosis not present

## 2018-06-02 DIAGNOSIS — N2581 Secondary hyperparathyroidism of renal origin: Secondary | ICD-10-CM | POA: Diagnosis not present

## 2018-06-03 DIAGNOSIS — E440 Moderate protein-calorie malnutrition: Secondary | ICD-10-CM | POA: Diagnosis not present

## 2018-06-03 DIAGNOSIS — N2581 Secondary hyperparathyroidism of renal origin: Secondary | ICD-10-CM | POA: Diagnosis not present

## 2018-06-03 DIAGNOSIS — Z79899 Other long term (current) drug therapy: Secondary | ICD-10-CM | POA: Diagnosis not present

## 2018-06-03 DIAGNOSIS — N186 End stage renal disease: Secondary | ICD-10-CM | POA: Diagnosis not present

## 2018-06-03 DIAGNOSIS — D631 Anemia in chronic kidney disease: Secondary | ICD-10-CM | POA: Diagnosis not present

## 2018-06-03 DIAGNOSIS — R17 Unspecified jaundice: Secondary | ICD-10-CM | POA: Diagnosis not present

## 2018-06-04 DIAGNOSIS — N186 End stage renal disease: Secondary | ICD-10-CM | POA: Diagnosis not present

## 2018-06-04 DIAGNOSIS — N2581 Secondary hyperparathyroidism of renal origin: Secondary | ICD-10-CM | POA: Diagnosis not present

## 2018-06-04 DIAGNOSIS — R17 Unspecified jaundice: Secondary | ICD-10-CM | POA: Diagnosis not present

## 2018-06-04 DIAGNOSIS — Z79899 Other long term (current) drug therapy: Secondary | ICD-10-CM | POA: Diagnosis not present

## 2018-06-04 DIAGNOSIS — D631 Anemia in chronic kidney disease: Secondary | ICD-10-CM | POA: Diagnosis not present

## 2018-06-04 DIAGNOSIS — E440 Moderate protein-calorie malnutrition: Secondary | ICD-10-CM | POA: Diagnosis not present

## 2018-06-05 DIAGNOSIS — E1122 Type 2 diabetes mellitus with diabetic chronic kidney disease: Secondary | ICD-10-CM | POA: Diagnosis not present

## 2018-06-05 DIAGNOSIS — B182 Chronic viral hepatitis C: Secondary | ICD-10-CM | POA: Diagnosis not present

## 2018-06-05 DIAGNOSIS — Z79899 Other long term (current) drug therapy: Secondary | ICD-10-CM | POA: Diagnosis not present

## 2018-06-05 DIAGNOSIS — D631 Anemia in chronic kidney disease: Secondary | ICD-10-CM | POA: Diagnosis not present

## 2018-06-05 DIAGNOSIS — F322 Major depressive disorder, single episode, severe without psychotic features: Secondary | ICD-10-CM | POA: Diagnosis not present

## 2018-06-05 DIAGNOSIS — R0981 Nasal congestion: Secondary | ICD-10-CM | POA: Diagnosis not present

## 2018-06-05 DIAGNOSIS — N2581 Secondary hyperparathyroidism of renal origin: Secondary | ICD-10-CM | POA: Diagnosis not present

## 2018-06-05 DIAGNOSIS — I10 Essential (primary) hypertension: Secondary | ICD-10-CM | POA: Diagnosis not present

## 2018-06-05 DIAGNOSIS — Z992 Dependence on renal dialysis: Secondary | ICD-10-CM | POA: Diagnosis not present

## 2018-06-05 DIAGNOSIS — N186 End stage renal disease: Secondary | ICD-10-CM | POA: Diagnosis not present

## 2018-06-05 DIAGNOSIS — R17 Unspecified jaundice: Secondary | ICD-10-CM | POA: Diagnosis not present

## 2018-06-05 DIAGNOSIS — E440 Moderate protein-calorie malnutrition: Secondary | ICD-10-CM | POA: Diagnosis not present

## 2018-06-05 DIAGNOSIS — E782 Mixed hyperlipidemia: Secondary | ICD-10-CM | POA: Diagnosis not present

## 2018-06-06 DIAGNOSIS — E440 Moderate protein-calorie malnutrition: Secondary | ICD-10-CM | POA: Diagnosis not present

## 2018-06-06 DIAGNOSIS — N2581 Secondary hyperparathyroidism of renal origin: Secondary | ICD-10-CM | POA: Diagnosis not present

## 2018-06-06 DIAGNOSIS — Z79899 Other long term (current) drug therapy: Secondary | ICD-10-CM | POA: Diagnosis not present

## 2018-06-06 DIAGNOSIS — N186 End stage renal disease: Secondary | ICD-10-CM | POA: Diagnosis not present

## 2018-06-06 DIAGNOSIS — D631 Anemia in chronic kidney disease: Secondary | ICD-10-CM | POA: Diagnosis not present

## 2018-06-06 DIAGNOSIS — R17 Unspecified jaundice: Secondary | ICD-10-CM | POA: Diagnosis not present

## 2018-06-07 DIAGNOSIS — E440 Moderate protein-calorie malnutrition: Secondary | ICD-10-CM | POA: Diagnosis not present

## 2018-06-07 DIAGNOSIS — N186 End stage renal disease: Secondary | ICD-10-CM | POA: Diagnosis not present

## 2018-06-07 DIAGNOSIS — Z79899 Other long term (current) drug therapy: Secondary | ICD-10-CM | POA: Diagnosis not present

## 2018-06-07 DIAGNOSIS — N2581 Secondary hyperparathyroidism of renal origin: Secondary | ICD-10-CM | POA: Diagnosis not present

## 2018-06-07 DIAGNOSIS — D631 Anemia in chronic kidney disease: Secondary | ICD-10-CM | POA: Diagnosis not present

## 2018-06-07 DIAGNOSIS — R17 Unspecified jaundice: Secondary | ICD-10-CM | POA: Diagnosis not present

## 2018-06-08 DIAGNOSIS — E440 Moderate protein-calorie malnutrition: Secondary | ICD-10-CM | POA: Diagnosis not present

## 2018-06-08 DIAGNOSIS — N2581 Secondary hyperparathyroidism of renal origin: Secondary | ICD-10-CM | POA: Diagnosis not present

## 2018-06-08 DIAGNOSIS — N186 End stage renal disease: Secondary | ICD-10-CM | POA: Diagnosis not present

## 2018-06-08 DIAGNOSIS — R17 Unspecified jaundice: Secondary | ICD-10-CM | POA: Diagnosis not present

## 2018-06-08 DIAGNOSIS — Z79899 Other long term (current) drug therapy: Secondary | ICD-10-CM | POA: Diagnosis not present

## 2018-06-08 DIAGNOSIS — D631 Anemia in chronic kidney disease: Secondary | ICD-10-CM | POA: Diagnosis not present

## 2018-06-09 DIAGNOSIS — Z79899 Other long term (current) drug therapy: Secondary | ICD-10-CM | POA: Diagnosis not present

## 2018-06-09 DIAGNOSIS — N2581 Secondary hyperparathyroidism of renal origin: Secondary | ICD-10-CM | POA: Diagnosis not present

## 2018-06-09 DIAGNOSIS — E440 Moderate protein-calorie malnutrition: Secondary | ICD-10-CM | POA: Diagnosis not present

## 2018-06-09 DIAGNOSIS — D631 Anemia in chronic kidney disease: Secondary | ICD-10-CM | POA: Diagnosis not present

## 2018-06-09 DIAGNOSIS — R17 Unspecified jaundice: Secondary | ICD-10-CM | POA: Diagnosis not present

## 2018-06-09 DIAGNOSIS — N186 End stage renal disease: Secondary | ICD-10-CM | POA: Diagnosis not present

## 2018-06-10 DIAGNOSIS — E440 Moderate protein-calorie malnutrition: Secondary | ICD-10-CM | POA: Diagnosis not present

## 2018-06-10 DIAGNOSIS — R17 Unspecified jaundice: Secondary | ICD-10-CM | POA: Diagnosis not present

## 2018-06-10 DIAGNOSIS — N186 End stage renal disease: Secondary | ICD-10-CM | POA: Diagnosis not present

## 2018-06-10 DIAGNOSIS — Z79899 Other long term (current) drug therapy: Secondary | ICD-10-CM | POA: Diagnosis not present

## 2018-06-10 DIAGNOSIS — N2581 Secondary hyperparathyroidism of renal origin: Secondary | ICD-10-CM | POA: Diagnosis not present

## 2018-06-10 DIAGNOSIS — D631 Anemia in chronic kidney disease: Secondary | ICD-10-CM | POA: Diagnosis not present

## 2018-06-11 DIAGNOSIS — R17 Unspecified jaundice: Secondary | ICD-10-CM | POA: Diagnosis not present

## 2018-06-11 DIAGNOSIS — N186 End stage renal disease: Secondary | ICD-10-CM | POA: Diagnosis not present

## 2018-06-11 DIAGNOSIS — N2581 Secondary hyperparathyroidism of renal origin: Secondary | ICD-10-CM | POA: Diagnosis not present

## 2018-06-11 DIAGNOSIS — D631 Anemia in chronic kidney disease: Secondary | ICD-10-CM | POA: Diagnosis not present

## 2018-06-11 DIAGNOSIS — Z79899 Other long term (current) drug therapy: Secondary | ICD-10-CM | POA: Diagnosis not present

## 2018-06-11 DIAGNOSIS — E440 Moderate protein-calorie malnutrition: Secondary | ICD-10-CM | POA: Diagnosis not present

## 2018-06-12 DIAGNOSIS — N2581 Secondary hyperparathyroidism of renal origin: Secondary | ICD-10-CM | POA: Diagnosis not present

## 2018-06-12 DIAGNOSIS — E440 Moderate protein-calorie malnutrition: Secondary | ICD-10-CM | POA: Diagnosis not present

## 2018-06-12 DIAGNOSIS — N186 End stage renal disease: Secondary | ICD-10-CM | POA: Diagnosis not present

## 2018-06-12 DIAGNOSIS — D631 Anemia in chronic kidney disease: Secondary | ICD-10-CM | POA: Diagnosis not present

## 2018-06-12 DIAGNOSIS — R17 Unspecified jaundice: Secondary | ICD-10-CM | POA: Diagnosis not present

## 2018-06-12 DIAGNOSIS — Z79899 Other long term (current) drug therapy: Secondary | ICD-10-CM | POA: Diagnosis not present

## 2018-06-13 DIAGNOSIS — R17 Unspecified jaundice: Secondary | ICD-10-CM | POA: Diagnosis not present

## 2018-06-13 DIAGNOSIS — D631 Anemia in chronic kidney disease: Secondary | ICD-10-CM | POA: Diagnosis not present

## 2018-06-13 DIAGNOSIS — N2581 Secondary hyperparathyroidism of renal origin: Secondary | ICD-10-CM | POA: Diagnosis not present

## 2018-06-13 DIAGNOSIS — N186 End stage renal disease: Secondary | ICD-10-CM | POA: Diagnosis not present

## 2018-06-13 DIAGNOSIS — Z79899 Other long term (current) drug therapy: Secondary | ICD-10-CM | POA: Diagnosis not present

## 2018-06-13 DIAGNOSIS — E440 Moderate protein-calorie malnutrition: Secondary | ICD-10-CM | POA: Diagnosis not present

## 2018-06-14 DIAGNOSIS — Z79899 Other long term (current) drug therapy: Secondary | ICD-10-CM | POA: Diagnosis not present

## 2018-06-14 DIAGNOSIS — E440 Moderate protein-calorie malnutrition: Secondary | ICD-10-CM | POA: Diagnosis not present

## 2018-06-14 DIAGNOSIS — N2581 Secondary hyperparathyroidism of renal origin: Secondary | ICD-10-CM | POA: Diagnosis not present

## 2018-06-14 DIAGNOSIS — N186 End stage renal disease: Secondary | ICD-10-CM | POA: Diagnosis not present

## 2018-06-14 DIAGNOSIS — D631 Anemia in chronic kidney disease: Secondary | ICD-10-CM | POA: Diagnosis not present

## 2018-06-14 DIAGNOSIS — R17 Unspecified jaundice: Secondary | ICD-10-CM | POA: Diagnosis not present

## 2018-06-15 DIAGNOSIS — D631 Anemia in chronic kidney disease: Secondary | ICD-10-CM | POA: Diagnosis not present

## 2018-06-15 DIAGNOSIS — R17 Unspecified jaundice: Secondary | ICD-10-CM | POA: Diagnosis not present

## 2018-06-15 DIAGNOSIS — Z79899 Other long term (current) drug therapy: Secondary | ICD-10-CM | POA: Diagnosis not present

## 2018-06-15 DIAGNOSIS — N2581 Secondary hyperparathyroidism of renal origin: Secondary | ICD-10-CM | POA: Diagnosis not present

## 2018-06-15 DIAGNOSIS — E440 Moderate protein-calorie malnutrition: Secondary | ICD-10-CM | POA: Diagnosis not present

## 2018-06-15 DIAGNOSIS — N186 End stage renal disease: Secondary | ICD-10-CM | POA: Diagnosis not present

## 2018-06-16 DIAGNOSIS — R17 Unspecified jaundice: Secondary | ICD-10-CM | POA: Diagnosis not present

## 2018-06-16 DIAGNOSIS — Z79899 Other long term (current) drug therapy: Secondary | ICD-10-CM | POA: Diagnosis not present

## 2018-06-16 DIAGNOSIS — D631 Anemia in chronic kidney disease: Secondary | ICD-10-CM | POA: Diagnosis not present

## 2018-06-16 DIAGNOSIS — E440 Moderate protein-calorie malnutrition: Secondary | ICD-10-CM | POA: Diagnosis not present

## 2018-06-16 DIAGNOSIS — N186 End stage renal disease: Secondary | ICD-10-CM | POA: Diagnosis not present

## 2018-06-16 DIAGNOSIS — N2581 Secondary hyperparathyroidism of renal origin: Secondary | ICD-10-CM | POA: Diagnosis not present

## 2018-06-17 DIAGNOSIS — N2581 Secondary hyperparathyroidism of renal origin: Secondary | ICD-10-CM | POA: Diagnosis not present

## 2018-06-17 DIAGNOSIS — N186 End stage renal disease: Secondary | ICD-10-CM | POA: Diagnosis not present

## 2018-06-17 DIAGNOSIS — E440 Moderate protein-calorie malnutrition: Secondary | ICD-10-CM | POA: Diagnosis not present

## 2018-06-17 DIAGNOSIS — R17 Unspecified jaundice: Secondary | ICD-10-CM | POA: Diagnosis not present

## 2018-06-17 DIAGNOSIS — Z79899 Other long term (current) drug therapy: Secondary | ICD-10-CM | POA: Diagnosis not present

## 2018-06-17 DIAGNOSIS — D631 Anemia in chronic kidney disease: Secondary | ICD-10-CM | POA: Diagnosis not present

## 2018-06-18 DIAGNOSIS — N2581 Secondary hyperparathyroidism of renal origin: Secondary | ICD-10-CM | POA: Diagnosis not present

## 2018-06-18 DIAGNOSIS — Z79899 Other long term (current) drug therapy: Secondary | ICD-10-CM | POA: Diagnosis not present

## 2018-06-18 DIAGNOSIS — N186 End stage renal disease: Secondary | ICD-10-CM | POA: Diagnosis not present

## 2018-06-18 DIAGNOSIS — R17 Unspecified jaundice: Secondary | ICD-10-CM | POA: Diagnosis not present

## 2018-06-18 DIAGNOSIS — E440 Moderate protein-calorie malnutrition: Secondary | ICD-10-CM | POA: Diagnosis not present

## 2018-06-18 DIAGNOSIS — D631 Anemia in chronic kidney disease: Secondary | ICD-10-CM | POA: Diagnosis not present

## 2018-06-19 DIAGNOSIS — N2581 Secondary hyperparathyroidism of renal origin: Secondary | ICD-10-CM | POA: Diagnosis not present

## 2018-06-19 DIAGNOSIS — D631 Anemia in chronic kidney disease: Secondary | ICD-10-CM | POA: Diagnosis not present

## 2018-06-19 DIAGNOSIS — N186 End stage renal disease: Secondary | ICD-10-CM | POA: Diagnosis not present

## 2018-06-19 DIAGNOSIS — Z79899 Other long term (current) drug therapy: Secondary | ICD-10-CM | POA: Diagnosis not present

## 2018-06-19 DIAGNOSIS — E440 Moderate protein-calorie malnutrition: Secondary | ICD-10-CM | POA: Diagnosis not present

## 2018-06-19 DIAGNOSIS — R17 Unspecified jaundice: Secondary | ICD-10-CM | POA: Diagnosis not present

## 2018-06-20 DIAGNOSIS — N2581 Secondary hyperparathyroidism of renal origin: Secondary | ICD-10-CM | POA: Diagnosis not present

## 2018-06-20 DIAGNOSIS — R17 Unspecified jaundice: Secondary | ICD-10-CM | POA: Diagnosis not present

## 2018-06-20 DIAGNOSIS — N186 End stage renal disease: Secondary | ICD-10-CM | POA: Diagnosis not present

## 2018-06-20 DIAGNOSIS — Z79899 Other long term (current) drug therapy: Secondary | ICD-10-CM | POA: Diagnosis not present

## 2018-06-20 DIAGNOSIS — D631 Anemia in chronic kidney disease: Secondary | ICD-10-CM | POA: Diagnosis not present

## 2018-06-20 DIAGNOSIS — E440 Moderate protein-calorie malnutrition: Secondary | ICD-10-CM | POA: Diagnosis not present

## 2018-06-21 DIAGNOSIS — E440 Moderate protein-calorie malnutrition: Secondary | ICD-10-CM | POA: Diagnosis not present

## 2018-06-21 DIAGNOSIS — Z79899 Other long term (current) drug therapy: Secondary | ICD-10-CM | POA: Diagnosis not present

## 2018-06-21 DIAGNOSIS — D631 Anemia in chronic kidney disease: Secondary | ICD-10-CM | POA: Diagnosis not present

## 2018-06-21 DIAGNOSIS — N2581 Secondary hyperparathyroidism of renal origin: Secondary | ICD-10-CM | POA: Diagnosis not present

## 2018-06-21 DIAGNOSIS — R17 Unspecified jaundice: Secondary | ICD-10-CM | POA: Diagnosis not present

## 2018-06-21 DIAGNOSIS — N186 End stage renal disease: Secondary | ICD-10-CM | POA: Diagnosis not present

## 2018-06-22 DIAGNOSIS — D631 Anemia in chronic kidney disease: Secondary | ICD-10-CM | POA: Diagnosis not present

## 2018-06-22 DIAGNOSIS — N2581 Secondary hyperparathyroidism of renal origin: Secondary | ICD-10-CM | POA: Diagnosis not present

## 2018-06-22 DIAGNOSIS — E440 Moderate protein-calorie malnutrition: Secondary | ICD-10-CM | POA: Diagnosis not present

## 2018-06-22 DIAGNOSIS — Z4932 Encounter for adequacy testing for peritoneal dialysis: Secondary | ICD-10-CM | POA: Diagnosis not present

## 2018-06-22 DIAGNOSIS — N186 End stage renal disease: Secondary | ICD-10-CM | POA: Diagnosis not present

## 2018-06-22 DIAGNOSIS — R17 Unspecified jaundice: Secondary | ICD-10-CM | POA: Diagnosis not present

## 2018-06-22 DIAGNOSIS — I129 Hypertensive chronic kidney disease with stage 1 through stage 4 chronic kidney disease, or unspecified chronic kidney disease: Secondary | ICD-10-CM | POA: Diagnosis not present

## 2018-06-22 DIAGNOSIS — Z79899 Other long term (current) drug therapy: Secondary | ICD-10-CM | POA: Diagnosis not present

## 2018-06-22 DIAGNOSIS — Z992 Dependence on renal dialysis: Secondary | ICD-10-CM | POA: Diagnosis not present

## 2018-06-22 DIAGNOSIS — D509 Iron deficiency anemia, unspecified: Secondary | ICD-10-CM | POA: Diagnosis not present

## 2018-06-23 DIAGNOSIS — N186 End stage renal disease: Secondary | ICD-10-CM | POA: Diagnosis not present

## 2018-06-23 DIAGNOSIS — D631 Anemia in chronic kidney disease: Secondary | ICD-10-CM | POA: Diagnosis not present

## 2018-06-23 DIAGNOSIS — D509 Iron deficiency anemia, unspecified: Secondary | ICD-10-CM | POA: Diagnosis not present

## 2018-06-23 DIAGNOSIS — N2581 Secondary hyperparathyroidism of renal origin: Secondary | ICD-10-CM | POA: Diagnosis not present

## 2018-06-23 DIAGNOSIS — Z4932 Encounter for adequacy testing for peritoneal dialysis: Secondary | ICD-10-CM | POA: Diagnosis not present

## 2018-06-23 DIAGNOSIS — E440 Moderate protein-calorie malnutrition: Secondary | ICD-10-CM | POA: Diagnosis not present

## 2018-06-24 DIAGNOSIS — D509 Iron deficiency anemia, unspecified: Secondary | ICD-10-CM | POA: Diagnosis not present

## 2018-06-24 DIAGNOSIS — D631 Anemia in chronic kidney disease: Secondary | ICD-10-CM | POA: Diagnosis not present

## 2018-06-24 DIAGNOSIS — R82994 Hypercalciuria: Secondary | ICD-10-CM | POA: Diagnosis not present

## 2018-06-24 DIAGNOSIS — N2581 Secondary hyperparathyroidism of renal origin: Secondary | ICD-10-CM | POA: Diagnosis not present

## 2018-06-24 DIAGNOSIS — E440 Moderate protein-calorie malnutrition: Secondary | ICD-10-CM | POA: Diagnosis not present

## 2018-06-24 DIAGNOSIS — Z4932 Encounter for adequacy testing for peritoneal dialysis: Secondary | ICD-10-CM | POA: Diagnosis not present

## 2018-06-24 DIAGNOSIS — N186 End stage renal disease: Secondary | ICD-10-CM | POA: Diagnosis not present

## 2018-06-25 DIAGNOSIS — D631 Anemia in chronic kidney disease: Secondary | ICD-10-CM | POA: Diagnosis not present

## 2018-06-25 DIAGNOSIS — E440 Moderate protein-calorie malnutrition: Secondary | ICD-10-CM | POA: Diagnosis not present

## 2018-06-25 DIAGNOSIS — D509 Iron deficiency anemia, unspecified: Secondary | ICD-10-CM | POA: Diagnosis not present

## 2018-06-25 DIAGNOSIS — N186 End stage renal disease: Secondary | ICD-10-CM | POA: Diagnosis not present

## 2018-06-25 DIAGNOSIS — N2581 Secondary hyperparathyroidism of renal origin: Secondary | ICD-10-CM | POA: Diagnosis not present

## 2018-06-25 DIAGNOSIS — Z4932 Encounter for adequacy testing for peritoneal dialysis: Secondary | ICD-10-CM | POA: Diagnosis not present

## 2018-06-26 DIAGNOSIS — D509 Iron deficiency anemia, unspecified: Secondary | ICD-10-CM | POA: Diagnosis not present

## 2018-06-26 DIAGNOSIS — E440 Moderate protein-calorie malnutrition: Secondary | ICD-10-CM | POA: Diagnosis not present

## 2018-06-26 DIAGNOSIS — D631 Anemia in chronic kidney disease: Secondary | ICD-10-CM | POA: Diagnosis not present

## 2018-06-26 DIAGNOSIS — N186 End stage renal disease: Secondary | ICD-10-CM | POA: Diagnosis not present

## 2018-06-26 DIAGNOSIS — N2581 Secondary hyperparathyroidism of renal origin: Secondary | ICD-10-CM | POA: Diagnosis not present

## 2018-06-26 DIAGNOSIS — Z4932 Encounter for adequacy testing for peritoneal dialysis: Secondary | ICD-10-CM | POA: Diagnosis not present

## 2018-06-27 DIAGNOSIS — E440 Moderate protein-calorie malnutrition: Secondary | ICD-10-CM | POA: Diagnosis not present

## 2018-06-27 DIAGNOSIS — Z4932 Encounter for adequacy testing for peritoneal dialysis: Secondary | ICD-10-CM | POA: Diagnosis not present

## 2018-06-27 DIAGNOSIS — D509 Iron deficiency anemia, unspecified: Secondary | ICD-10-CM | POA: Diagnosis not present

## 2018-06-27 DIAGNOSIS — N2581 Secondary hyperparathyroidism of renal origin: Secondary | ICD-10-CM | POA: Diagnosis not present

## 2018-06-27 DIAGNOSIS — N186 End stage renal disease: Secondary | ICD-10-CM | POA: Diagnosis not present

## 2018-06-27 DIAGNOSIS — D631 Anemia in chronic kidney disease: Secondary | ICD-10-CM | POA: Diagnosis not present

## 2018-06-28 DIAGNOSIS — D631 Anemia in chronic kidney disease: Secondary | ICD-10-CM | POA: Diagnosis not present

## 2018-06-28 DIAGNOSIS — N186 End stage renal disease: Secondary | ICD-10-CM | POA: Diagnosis not present

## 2018-06-28 DIAGNOSIS — N2581 Secondary hyperparathyroidism of renal origin: Secondary | ICD-10-CM | POA: Diagnosis not present

## 2018-06-28 DIAGNOSIS — Z4932 Encounter for adequacy testing for peritoneal dialysis: Secondary | ICD-10-CM | POA: Diagnosis not present

## 2018-06-28 DIAGNOSIS — E440 Moderate protein-calorie malnutrition: Secondary | ICD-10-CM | POA: Diagnosis not present

## 2018-06-28 DIAGNOSIS — D509 Iron deficiency anemia, unspecified: Secondary | ICD-10-CM | POA: Diagnosis not present

## 2018-06-29 DIAGNOSIS — D631 Anemia in chronic kidney disease: Secondary | ICD-10-CM | POA: Diagnosis not present

## 2018-06-29 DIAGNOSIS — E440 Moderate protein-calorie malnutrition: Secondary | ICD-10-CM | POA: Diagnosis not present

## 2018-06-29 DIAGNOSIS — D509 Iron deficiency anemia, unspecified: Secondary | ICD-10-CM | POA: Diagnosis not present

## 2018-06-29 DIAGNOSIS — N186 End stage renal disease: Secondary | ICD-10-CM | POA: Diagnosis not present

## 2018-06-29 DIAGNOSIS — N2581 Secondary hyperparathyroidism of renal origin: Secondary | ICD-10-CM | POA: Diagnosis not present

## 2018-06-29 DIAGNOSIS — Z4932 Encounter for adequacy testing for peritoneal dialysis: Secondary | ICD-10-CM | POA: Diagnosis not present

## 2018-06-30 DIAGNOSIS — E440 Moderate protein-calorie malnutrition: Secondary | ICD-10-CM | POA: Diagnosis not present

## 2018-06-30 DIAGNOSIS — Z4932 Encounter for adequacy testing for peritoneal dialysis: Secondary | ICD-10-CM | POA: Diagnosis not present

## 2018-06-30 DIAGNOSIS — D509 Iron deficiency anemia, unspecified: Secondary | ICD-10-CM | POA: Diagnosis not present

## 2018-06-30 DIAGNOSIS — N2581 Secondary hyperparathyroidism of renal origin: Secondary | ICD-10-CM | POA: Diagnosis not present

## 2018-06-30 DIAGNOSIS — N186 End stage renal disease: Secondary | ICD-10-CM | POA: Diagnosis not present

## 2018-06-30 DIAGNOSIS — D631 Anemia in chronic kidney disease: Secondary | ICD-10-CM | POA: Diagnosis not present

## 2018-07-01 DIAGNOSIS — N2581 Secondary hyperparathyroidism of renal origin: Secondary | ICD-10-CM | POA: Diagnosis not present

## 2018-07-01 DIAGNOSIS — E440 Moderate protein-calorie malnutrition: Secondary | ICD-10-CM | POA: Diagnosis not present

## 2018-07-01 DIAGNOSIS — D509 Iron deficiency anemia, unspecified: Secondary | ICD-10-CM | POA: Diagnosis not present

## 2018-07-01 DIAGNOSIS — Z4932 Encounter for adequacy testing for peritoneal dialysis: Secondary | ICD-10-CM | POA: Diagnosis not present

## 2018-07-01 DIAGNOSIS — N186 End stage renal disease: Secondary | ICD-10-CM | POA: Diagnosis not present

## 2018-07-01 DIAGNOSIS — D631 Anemia in chronic kidney disease: Secondary | ICD-10-CM | POA: Diagnosis not present

## 2018-07-02 DIAGNOSIS — D509 Iron deficiency anemia, unspecified: Secondary | ICD-10-CM | POA: Diagnosis not present

## 2018-07-02 DIAGNOSIS — N186 End stage renal disease: Secondary | ICD-10-CM | POA: Diagnosis not present

## 2018-07-02 DIAGNOSIS — N2581 Secondary hyperparathyroidism of renal origin: Secondary | ICD-10-CM | POA: Diagnosis not present

## 2018-07-02 DIAGNOSIS — E440 Moderate protein-calorie malnutrition: Secondary | ICD-10-CM | POA: Diagnosis not present

## 2018-07-02 DIAGNOSIS — D631 Anemia in chronic kidney disease: Secondary | ICD-10-CM | POA: Diagnosis not present

## 2018-07-02 DIAGNOSIS — Z4932 Encounter for adequacy testing for peritoneal dialysis: Secondary | ICD-10-CM | POA: Diagnosis not present

## 2018-07-03 DIAGNOSIS — N2581 Secondary hyperparathyroidism of renal origin: Secondary | ICD-10-CM | POA: Diagnosis not present

## 2018-07-03 DIAGNOSIS — E440 Moderate protein-calorie malnutrition: Secondary | ICD-10-CM | POA: Diagnosis not present

## 2018-07-03 DIAGNOSIS — R51 Headache: Secondary | ICD-10-CM | POA: Diagnosis not present

## 2018-07-03 DIAGNOSIS — N186 End stage renal disease: Secondary | ICD-10-CM | POA: Diagnosis not present

## 2018-07-03 DIAGNOSIS — R0981 Nasal congestion: Secondary | ICD-10-CM | POA: Diagnosis not present

## 2018-07-03 DIAGNOSIS — Z4932 Encounter for adequacy testing for peritoneal dialysis: Secondary | ICD-10-CM | POA: Diagnosis not present

## 2018-07-03 DIAGNOSIS — D509 Iron deficiency anemia, unspecified: Secondary | ICD-10-CM | POA: Diagnosis not present

## 2018-07-03 DIAGNOSIS — D631 Anemia in chronic kidney disease: Secondary | ICD-10-CM | POA: Diagnosis not present

## 2018-07-04 ENCOUNTER — Telehealth: Payer: Self-pay | Admitting: Podiatry

## 2018-07-04 DIAGNOSIS — E782 Mixed hyperlipidemia: Secondary | ICD-10-CM | POA: Diagnosis not present

## 2018-07-04 DIAGNOSIS — D509 Iron deficiency anemia, unspecified: Secondary | ICD-10-CM | POA: Diagnosis not present

## 2018-07-04 DIAGNOSIS — Z4932 Encounter for adequacy testing for peritoneal dialysis: Secondary | ICD-10-CM | POA: Diagnosis not present

## 2018-07-04 DIAGNOSIS — N2581 Secondary hyperparathyroidism of renal origin: Secondary | ICD-10-CM | POA: Diagnosis not present

## 2018-07-04 DIAGNOSIS — E440 Moderate protein-calorie malnutrition: Secondary | ICD-10-CM | POA: Diagnosis not present

## 2018-07-04 DIAGNOSIS — D631 Anemia in chronic kidney disease: Secondary | ICD-10-CM | POA: Diagnosis not present

## 2018-07-04 DIAGNOSIS — N186 End stage renal disease: Secondary | ICD-10-CM | POA: Diagnosis not present

## 2018-07-04 MED ORDER — CICLOPIROX 8 % EX SOLN: Freq: Every day | CUTANEOUS | 0 refills | Status: DC

## 2018-07-04 NOTE — Telephone Encounter (Signed)
Pt called requesting refill on Topical Solution, Ciclopirox 8% nail lacquer. Please give pt a call.

## 2018-07-04 NOTE — Addendum Note (Signed)
Addended by: Harriett Sine D on: 07/04/2018 11:49 AM   Modules accepted: Orders

## 2018-07-04 NOTE — Telephone Encounter (Signed)
Left message with orders for Penlac at [facility].

## 2018-07-04 NOTE — Telephone Encounter (Signed)
I told pt Dr. March Rummage had wanted to see her in September, and I did not see that she had been seen in office at that time. Pt states she had hernia surgery and she is scheduled for 07/17/2018. I told pt I would refill the Penlac once and if Dr. March Rummage wanted to continue he would order again or change medications.

## 2018-07-05 DIAGNOSIS — D631 Anemia in chronic kidney disease: Secondary | ICD-10-CM | POA: Diagnosis not present

## 2018-07-05 DIAGNOSIS — N2581 Secondary hyperparathyroidism of renal origin: Secondary | ICD-10-CM | POA: Diagnosis not present

## 2018-07-05 DIAGNOSIS — Z4932 Encounter for adequacy testing for peritoneal dialysis: Secondary | ICD-10-CM | POA: Diagnosis not present

## 2018-07-05 DIAGNOSIS — E440 Moderate protein-calorie malnutrition: Secondary | ICD-10-CM | POA: Diagnosis not present

## 2018-07-05 DIAGNOSIS — N186 End stage renal disease: Secondary | ICD-10-CM | POA: Diagnosis not present

## 2018-07-05 DIAGNOSIS — D509 Iron deficiency anemia, unspecified: Secondary | ICD-10-CM | POA: Diagnosis not present

## 2018-07-06 DIAGNOSIS — E440 Moderate protein-calorie malnutrition: Secondary | ICD-10-CM | POA: Diagnosis not present

## 2018-07-06 DIAGNOSIS — D631 Anemia in chronic kidney disease: Secondary | ICD-10-CM | POA: Diagnosis not present

## 2018-07-06 DIAGNOSIS — D509 Iron deficiency anemia, unspecified: Secondary | ICD-10-CM | POA: Diagnosis not present

## 2018-07-06 DIAGNOSIS — Z4932 Encounter for adequacy testing for peritoneal dialysis: Secondary | ICD-10-CM | POA: Diagnosis not present

## 2018-07-06 DIAGNOSIS — N2581 Secondary hyperparathyroidism of renal origin: Secondary | ICD-10-CM | POA: Diagnosis not present

## 2018-07-06 DIAGNOSIS — N186 End stage renal disease: Secondary | ICD-10-CM | POA: Diagnosis not present

## 2018-07-07 DIAGNOSIS — N186 End stage renal disease: Secondary | ICD-10-CM | POA: Diagnosis not present

## 2018-07-07 DIAGNOSIS — D631 Anemia in chronic kidney disease: Secondary | ICD-10-CM | POA: Diagnosis not present

## 2018-07-07 DIAGNOSIS — D509 Iron deficiency anemia, unspecified: Secondary | ICD-10-CM | POA: Diagnosis not present

## 2018-07-07 DIAGNOSIS — N2581 Secondary hyperparathyroidism of renal origin: Secondary | ICD-10-CM | POA: Diagnosis not present

## 2018-07-07 DIAGNOSIS — Z4932 Encounter for adequacy testing for peritoneal dialysis: Secondary | ICD-10-CM | POA: Diagnosis not present

## 2018-07-07 DIAGNOSIS — E440 Moderate protein-calorie malnutrition: Secondary | ICD-10-CM | POA: Diagnosis not present

## 2018-07-08 DIAGNOSIS — N2581 Secondary hyperparathyroidism of renal origin: Secondary | ICD-10-CM | POA: Diagnosis not present

## 2018-07-08 DIAGNOSIS — E440 Moderate protein-calorie malnutrition: Secondary | ICD-10-CM | POA: Diagnosis not present

## 2018-07-08 DIAGNOSIS — Z4932 Encounter for adequacy testing for peritoneal dialysis: Secondary | ICD-10-CM | POA: Diagnosis not present

## 2018-07-08 DIAGNOSIS — N186 End stage renal disease: Secondary | ICD-10-CM | POA: Diagnosis not present

## 2018-07-08 DIAGNOSIS — D631 Anemia in chronic kidney disease: Secondary | ICD-10-CM | POA: Diagnosis not present

## 2018-07-08 DIAGNOSIS — D509 Iron deficiency anemia, unspecified: Secondary | ICD-10-CM | POA: Diagnosis not present

## 2018-07-09 DIAGNOSIS — Z4932 Encounter for adequacy testing for peritoneal dialysis: Secondary | ICD-10-CM | POA: Diagnosis not present

## 2018-07-09 DIAGNOSIS — N2581 Secondary hyperparathyroidism of renal origin: Secondary | ICD-10-CM | POA: Diagnosis not present

## 2018-07-09 DIAGNOSIS — E440 Moderate protein-calorie malnutrition: Secondary | ICD-10-CM | POA: Diagnosis not present

## 2018-07-09 DIAGNOSIS — N186 End stage renal disease: Secondary | ICD-10-CM | POA: Diagnosis not present

## 2018-07-09 DIAGNOSIS — D631 Anemia in chronic kidney disease: Secondary | ICD-10-CM | POA: Diagnosis not present

## 2018-07-09 DIAGNOSIS — D509 Iron deficiency anemia, unspecified: Secondary | ICD-10-CM | POA: Diagnosis not present

## 2018-07-10 DIAGNOSIS — D631 Anemia in chronic kidney disease: Secondary | ICD-10-CM | POA: Diagnosis not present

## 2018-07-10 DIAGNOSIS — N186 End stage renal disease: Secondary | ICD-10-CM | POA: Diagnosis not present

## 2018-07-10 DIAGNOSIS — E440 Moderate protein-calorie malnutrition: Secondary | ICD-10-CM | POA: Diagnosis not present

## 2018-07-10 DIAGNOSIS — N2581 Secondary hyperparathyroidism of renal origin: Secondary | ICD-10-CM | POA: Diagnosis not present

## 2018-07-10 DIAGNOSIS — Z4932 Encounter for adequacy testing for peritoneal dialysis: Secondary | ICD-10-CM | POA: Diagnosis not present

## 2018-07-10 DIAGNOSIS — D509 Iron deficiency anemia, unspecified: Secondary | ICD-10-CM | POA: Diagnosis not present

## 2018-07-11 DIAGNOSIS — E440 Moderate protein-calorie malnutrition: Secondary | ICD-10-CM | POA: Diagnosis not present

## 2018-07-11 DIAGNOSIS — Z4932 Encounter for adequacy testing for peritoneal dialysis: Secondary | ICD-10-CM | POA: Diagnosis not present

## 2018-07-11 DIAGNOSIS — D509 Iron deficiency anemia, unspecified: Secondary | ICD-10-CM | POA: Diagnosis not present

## 2018-07-11 DIAGNOSIS — D631 Anemia in chronic kidney disease: Secondary | ICD-10-CM | POA: Diagnosis not present

## 2018-07-11 DIAGNOSIS — N2581 Secondary hyperparathyroidism of renal origin: Secondary | ICD-10-CM | POA: Diagnosis not present

## 2018-07-11 DIAGNOSIS — N186 End stage renal disease: Secondary | ICD-10-CM | POA: Diagnosis not present

## 2018-07-12 DIAGNOSIS — D631 Anemia in chronic kidney disease: Secondary | ICD-10-CM | POA: Diagnosis not present

## 2018-07-12 DIAGNOSIS — N2581 Secondary hyperparathyroidism of renal origin: Secondary | ICD-10-CM | POA: Diagnosis not present

## 2018-07-12 DIAGNOSIS — D509 Iron deficiency anemia, unspecified: Secondary | ICD-10-CM | POA: Diagnosis not present

## 2018-07-12 DIAGNOSIS — N186 End stage renal disease: Secondary | ICD-10-CM | POA: Diagnosis not present

## 2018-07-12 DIAGNOSIS — E440 Moderate protein-calorie malnutrition: Secondary | ICD-10-CM | POA: Diagnosis not present

## 2018-07-12 DIAGNOSIS — Z4932 Encounter for adequacy testing for peritoneal dialysis: Secondary | ICD-10-CM | POA: Diagnosis not present

## 2018-07-13 DIAGNOSIS — N186 End stage renal disease: Secondary | ICD-10-CM | POA: Diagnosis not present

## 2018-07-13 DIAGNOSIS — N2581 Secondary hyperparathyroidism of renal origin: Secondary | ICD-10-CM | POA: Diagnosis not present

## 2018-07-13 DIAGNOSIS — E440 Moderate protein-calorie malnutrition: Secondary | ICD-10-CM | POA: Diagnosis not present

## 2018-07-13 DIAGNOSIS — D631 Anemia in chronic kidney disease: Secondary | ICD-10-CM | POA: Diagnosis not present

## 2018-07-13 DIAGNOSIS — D509 Iron deficiency anemia, unspecified: Secondary | ICD-10-CM | POA: Diagnosis not present

## 2018-07-13 DIAGNOSIS — Z4932 Encounter for adequacy testing for peritoneal dialysis: Secondary | ICD-10-CM | POA: Diagnosis not present

## 2018-07-14 DIAGNOSIS — Z4932 Encounter for adequacy testing for peritoneal dialysis: Secondary | ICD-10-CM | POA: Diagnosis not present

## 2018-07-14 DIAGNOSIS — N2581 Secondary hyperparathyroidism of renal origin: Secondary | ICD-10-CM | POA: Diagnosis not present

## 2018-07-14 DIAGNOSIS — D509 Iron deficiency anemia, unspecified: Secondary | ICD-10-CM | POA: Diagnosis not present

## 2018-07-14 DIAGNOSIS — N186 End stage renal disease: Secondary | ICD-10-CM | POA: Diagnosis not present

## 2018-07-14 DIAGNOSIS — D631 Anemia in chronic kidney disease: Secondary | ICD-10-CM | POA: Diagnosis not present

## 2018-07-14 DIAGNOSIS — E440 Moderate protein-calorie malnutrition: Secondary | ICD-10-CM | POA: Diagnosis not present

## 2018-07-15 DIAGNOSIS — D631 Anemia in chronic kidney disease: Secondary | ICD-10-CM | POA: Diagnosis not present

## 2018-07-15 DIAGNOSIS — N2581 Secondary hyperparathyroidism of renal origin: Secondary | ICD-10-CM | POA: Diagnosis not present

## 2018-07-15 DIAGNOSIS — E440 Moderate protein-calorie malnutrition: Secondary | ICD-10-CM | POA: Diagnosis not present

## 2018-07-15 DIAGNOSIS — D509 Iron deficiency anemia, unspecified: Secondary | ICD-10-CM | POA: Diagnosis not present

## 2018-07-15 DIAGNOSIS — Z4932 Encounter for adequacy testing for peritoneal dialysis: Secondary | ICD-10-CM | POA: Diagnosis not present

## 2018-07-15 DIAGNOSIS — N186 End stage renal disease: Secondary | ICD-10-CM | POA: Diagnosis not present

## 2018-07-16 DIAGNOSIS — N2581 Secondary hyperparathyroidism of renal origin: Secondary | ICD-10-CM | POA: Diagnosis not present

## 2018-07-16 DIAGNOSIS — D509 Iron deficiency anemia, unspecified: Secondary | ICD-10-CM | POA: Diagnosis not present

## 2018-07-16 DIAGNOSIS — N186 End stage renal disease: Secondary | ICD-10-CM | POA: Diagnosis not present

## 2018-07-16 DIAGNOSIS — Z4932 Encounter for adequacy testing for peritoneal dialysis: Secondary | ICD-10-CM | POA: Diagnosis not present

## 2018-07-16 DIAGNOSIS — D631 Anemia in chronic kidney disease: Secondary | ICD-10-CM | POA: Diagnosis not present

## 2018-07-16 DIAGNOSIS — E440 Moderate protein-calorie malnutrition: Secondary | ICD-10-CM | POA: Diagnosis not present

## 2018-07-17 ENCOUNTER — Other Ambulatory Visit: Payer: Self-pay

## 2018-07-17 ENCOUNTER — Ambulatory Visit (INDEPENDENT_AMBULATORY_CARE_PROVIDER_SITE_OTHER): Payer: Medicare Other | Admitting: Podiatry

## 2018-07-17 DIAGNOSIS — F419 Anxiety disorder, unspecified: Secondary | ICD-10-CM | POA: Insufficient documentation

## 2018-07-17 DIAGNOSIS — L603 Nail dystrophy: Secondary | ICD-10-CM | POA: Diagnosis not present

## 2018-07-17 DIAGNOSIS — D631 Anemia in chronic kidney disease: Secondary | ICD-10-CM | POA: Diagnosis not present

## 2018-07-17 DIAGNOSIS — E440 Moderate protein-calorie malnutrition: Secondary | ICD-10-CM | POA: Diagnosis not present

## 2018-07-17 DIAGNOSIS — N186 End stage renal disease: Secondary | ICD-10-CM | POA: Insufficient documentation

## 2018-07-17 DIAGNOSIS — Z4932 Encounter for adequacy testing for peritoneal dialysis: Secondary | ICD-10-CM | POA: Diagnosis not present

## 2018-07-17 DIAGNOSIS — N2581 Secondary hyperparathyroidism of renal origin: Secondary | ICD-10-CM | POA: Diagnosis not present

## 2018-07-17 DIAGNOSIS — M79609 Pain in unspecified limb: Secondary | ICD-10-CM | POA: Diagnosis not present

## 2018-07-17 DIAGNOSIS — D509 Iron deficiency anemia, unspecified: Secondary | ICD-10-CM | POA: Diagnosis not present

## 2018-07-17 DIAGNOSIS — B351 Tinea unguium: Secondary | ICD-10-CM

## 2018-07-17 DIAGNOSIS — Z98891 History of uterine scar from previous surgery: Secondary | ICD-10-CM | POA: Insufficient documentation

## 2018-07-17 DIAGNOSIS — Z992 Dependence on renal dialysis: Secondary | ICD-10-CM

## 2018-07-17 MED ORDER — CICLOPIROX 8 % EX SOLN: Freq: Every day | CUTANEOUS | 0 refills | Status: DC

## 2018-07-17 NOTE — Progress Notes (Signed)
Subjective:  Patient ID: Maureen Barnes, female    DOB: 08/03/1960,  MRN: [id number]  Chief Complaint  Patient presents with  . Nail Problem    right foot nail fungus    57 y.o. female presents with the above complaint.  States the nail fungus is about the same.  Review of Systems: Negative except as noted in the HPI. Denies N/V/F/Ch.  Past Medical History:  Diagnosis Date  . Anemia   . Diabetes mellitus   . Hep C w/o coma, chronic ([place])   . Hypertension   . Renal disorder     Current Outpatient Medications:  .  amLODipine (NORVASC) 10 MG tablet, Take 10 mg by mouth daily.  , Disp: , Rfl:  .  aspirin EC 81 MG tablet, Take 81 mg by mouth daily., Disp: , Rfl:  .  atorvastatin (LIPITOR) 40 MG tablet, Take 40 mg by mouth daily., Disp: , Rfl:  .  b complex-vitamin c-folic acid (NEPHRO-VITE) 0.8 MG TABS tablet, Take 1 tablet by mouth daily., Disp: , Rfl: 6 .  calcitRIOL (ROCALTROL) 0.25 MCG capsule, Take 0.25 mcg by mouth daily. Take 2 caps daily, Disp: , Rfl:  .  carvedilol (COREG) 12.5 MG tablet, Take 12.5 mg by mouth 2 (two) times daily with a meal., Disp: , Rfl:  .  cephALEXin (KEFLEX) 500 MG capsule, Take 1 capsule (500 mg total) by mouth 2 (two) times daily., Disp: 10 capsule, Rfl: 0 .  ciclopirox (PENLAC) 8 % solution, Apply topically at bedtime. Apply over nail and surrounding skin. Apply daily over previous coat. After seven (7) days, may remove with alcohol and continue cycle., Disp: 6.6 mL, Rfl: 0 .  cinacalcet (SENSIPAR) 30 MG tablet, TAKE 1 TABLET BY MOUTH EVERY OTHER DAY THREE DAYS A WEEK (3 TIMES A WEEK), Disp: , Rfl: 3 .  diphenhydrAMINE (BENADRYL) 25 MG tablet, Take 1 tablet (25 mg total) by mouth at bedtime as needed., Disp: 30 tablet, Rfl: 0 .  docusate sodium (COLACE) 100 MG capsule, Take 100 mg by mouth 2 (two) times daily., Disp: , Rfl:  .  ferrous sulfate 325 (65 FE) MG tablet, Take by mouth., Disp: , Rfl:  .  gentamicin cream (GARAMYCIN) 0.1 %, APPLY SMALL  AMOUNT TO EXIT SITE DAILY, Disp: , Rfl: 3 .  HYDROcodone-acetaminophen (NORCO/VICODIN) 5-325 MG tablet, TAKE 1 TABLET BY MOUTH EVERY 4 HOURS AS NEEDED FOR UP TO 5 DAYS., Disp: , Rfl: 0 .  Insulin Glargine (LANTUS SOLOSTAR [place]), Inject 26 Units into the skin daily., Disp: , Rfl:  .  lactulose, encephalopathy, (CHRONULAC) 10 GM/15ML SOLN, Take 10 g by mouth., Disp: , Rfl:  .  loratadine (CLARITIN) 10 MG tablet, Take 10 mg by mouth as directed., Disp: , Rfl:  .  losartan-hydrochlorothiazide (HYZAAR) 100-25 MG tablet, Take 1 tablet by mouth daily., Disp: , Rfl:  .  pantoprazole (PROTONIX) 40 MG tablet, Take 40 mg by mouth daily., Disp: , Rfl:  .  ranitidine (ZANTAC) 150 MG capsule, Take 150 mg by mouth every evening., Disp: , Rfl:  .  simethicone (MYLICON) 80 MG chewable tablet, Chew 80 mg by mouth every 6 (six) hours as needed for flatulence., Disp: , Rfl:  .  tiZANidine (ZANAFLEX) 2 MG tablet, Take by mouth., Disp: , Rfl:   Social History   Tobacco Use  Smoking Status Never Smoker  Smokeless Tobacco Never Used    Allergies  Allergen Reactions  . Peanut Oil Anaphylaxis, Itching and Swelling  Tree nuts  . Shellfish-Derived Products     Lips and thraot swell  . Iodine Itching and Swelling    Shellfish-lips swell itching.  . Peanut-Containing Drug Products Itching and Swelling  . Shellfish Allergy Itching and Swelling   Objective:  There were no vitals filed for this visit. There is no height or weight on file to calculate BMI. Constitutional Well developed. Well nourished.  Vascular Dorsalis pedis pulses palpable bilaterally. Posterior tibial pulses palpable bilaterally. Capillary refill normal to all digits.  No cyanosis or clubbing noted. Pedal hair growth normal.  Neurologic Normal speech. Oriented to person, place, and time. Epicritic sensation to light touch grossly present bilaterally.  Dermatologic Nails 4 and 5 right foot, left fifth dystrophic brown discoloration  subungual debris No open wounds. No skin lesions.  Orthopedic: Normal joint ROM without pain or crepitus bilaterally. No visible deformities. No bony tenderness.   Radiographs: None Assessment:   1. Dermatophytosis of nail   2. Nail dystrophy   3. Pain due to onychomycosis of nail    Plan:  Patient was evaluated and treated and all questions answered.  Onychomycosis -Discussed continue treatment with Penlac.  Other medicines none past this time due to concomitant medical issues.  Discussed avoiding other oral medications at this time and her insurance does not cover advanced topical medication.  Continue Penlac follow-up as needed  Return if symptoms worsen or fail to improve.

## 2018-07-18 DIAGNOSIS — N2581 Secondary hyperparathyroidism of renal origin: Secondary | ICD-10-CM | POA: Diagnosis not present

## 2018-07-18 DIAGNOSIS — N186 End stage renal disease: Secondary | ICD-10-CM | POA: Diagnosis not present

## 2018-07-18 DIAGNOSIS — E440 Moderate protein-calorie malnutrition: Secondary | ICD-10-CM | POA: Diagnosis not present

## 2018-07-18 DIAGNOSIS — D631 Anemia in chronic kidney disease: Secondary | ICD-10-CM | POA: Diagnosis not present

## 2018-07-18 DIAGNOSIS — Z4932 Encounter for adequacy testing for peritoneal dialysis: Secondary | ICD-10-CM | POA: Diagnosis not present

## 2018-07-18 DIAGNOSIS — D509 Iron deficiency anemia, unspecified: Secondary | ICD-10-CM | POA: Diagnosis not present

## 2018-07-19 DIAGNOSIS — Z4932 Encounter for adequacy testing for peritoneal dialysis: Secondary | ICD-10-CM | POA: Diagnosis not present

## 2018-07-19 DIAGNOSIS — D631 Anemia in chronic kidney disease: Secondary | ICD-10-CM | POA: Diagnosis not present

## 2018-07-19 DIAGNOSIS — N186 End stage renal disease: Secondary | ICD-10-CM | POA: Diagnosis not present

## 2018-07-19 DIAGNOSIS — D509 Iron deficiency anemia, unspecified: Secondary | ICD-10-CM | POA: Diagnosis not present

## 2018-07-19 DIAGNOSIS — E440 Moderate protein-calorie malnutrition: Secondary | ICD-10-CM | POA: Diagnosis not present

## 2018-07-19 DIAGNOSIS — N2581 Secondary hyperparathyroidism of renal origin: Secondary | ICD-10-CM | POA: Diagnosis not present

## 2018-07-20 DIAGNOSIS — D509 Iron deficiency anemia, unspecified: Secondary | ICD-10-CM | POA: Diagnosis not present

## 2018-07-20 DIAGNOSIS — N2581 Secondary hyperparathyroidism of renal origin: Secondary | ICD-10-CM | POA: Diagnosis not present

## 2018-07-20 DIAGNOSIS — Z4932 Encounter for adequacy testing for peritoneal dialysis: Secondary | ICD-10-CM | POA: Diagnosis not present

## 2018-07-20 DIAGNOSIS — D631 Anemia in chronic kidney disease: Secondary | ICD-10-CM | POA: Diagnosis not present

## 2018-07-20 DIAGNOSIS — N186 End stage renal disease: Secondary | ICD-10-CM | POA: Diagnosis not present

## 2018-07-20 DIAGNOSIS — E440 Moderate protein-calorie malnutrition: Secondary | ICD-10-CM | POA: Diagnosis not present

## 2018-07-21 DIAGNOSIS — Z4932 Encounter for adequacy testing for peritoneal dialysis: Secondary | ICD-10-CM | POA: Diagnosis not present

## 2018-07-21 DIAGNOSIS — E440 Moderate protein-calorie malnutrition: Secondary | ICD-10-CM | POA: Diagnosis not present

## 2018-07-21 DIAGNOSIS — N186 End stage renal disease: Secondary | ICD-10-CM | POA: Diagnosis not present

## 2018-07-21 DIAGNOSIS — N2581 Secondary hyperparathyroidism of renal origin: Secondary | ICD-10-CM | POA: Diagnosis not present

## 2018-07-21 DIAGNOSIS — D509 Iron deficiency anemia, unspecified: Secondary | ICD-10-CM | POA: Diagnosis not present

## 2018-07-21 DIAGNOSIS — D631 Anemia in chronic kidney disease: Secondary | ICD-10-CM | POA: Diagnosis not present

## 2018-07-22 DIAGNOSIS — N2581 Secondary hyperparathyroidism of renal origin: Secondary | ICD-10-CM | POA: Diagnosis not present

## 2018-07-22 DIAGNOSIS — Z4932 Encounter for adequacy testing for peritoneal dialysis: Secondary | ICD-10-CM | POA: Diagnosis not present

## 2018-07-22 DIAGNOSIS — E440 Moderate protein-calorie malnutrition: Secondary | ICD-10-CM | POA: Diagnosis not present

## 2018-07-22 DIAGNOSIS — D509 Iron deficiency anemia, unspecified: Secondary | ICD-10-CM | POA: Diagnosis not present

## 2018-07-22 DIAGNOSIS — D631 Anemia in chronic kidney disease: Secondary | ICD-10-CM | POA: Diagnosis not present

## 2018-07-22 DIAGNOSIS — N186 End stage renal disease: Secondary | ICD-10-CM | POA: Diagnosis not present

## 2018-07-23 DIAGNOSIS — N186 End stage renal disease: Secondary | ICD-10-CM | POA: Diagnosis not present

## 2018-07-23 DIAGNOSIS — Z4932 Encounter for adequacy testing for peritoneal dialysis: Secondary | ICD-10-CM | POA: Diagnosis not present

## 2018-07-23 DIAGNOSIS — N2589 Other disorders resulting from impaired renal tubular function: Secondary | ICD-10-CM | POA: Diagnosis not present

## 2018-07-23 DIAGNOSIS — D631 Anemia in chronic kidney disease: Secondary | ICD-10-CM | POA: Diagnosis not present

## 2018-07-23 DIAGNOSIS — N2581 Secondary hyperparathyroidism of renal origin: Secondary | ICD-10-CM | POA: Diagnosis not present

## 2018-07-23 DIAGNOSIS — D509 Iron deficiency anemia, unspecified: Secondary | ICD-10-CM | POA: Diagnosis not present

## 2018-07-23 DIAGNOSIS — K769 Liver disease, unspecified: Secondary | ICD-10-CM | POA: Diagnosis not present

## 2018-07-23 DIAGNOSIS — Z992 Dependence on renal dialysis: Secondary | ICD-10-CM | POA: Diagnosis not present

## 2018-07-23 DIAGNOSIS — I129 Hypertensive chronic kidney disease with stage 1 through stage 4 chronic kidney disease, or unspecified chronic kidney disease: Secondary | ICD-10-CM | POA: Diagnosis not present

## 2018-07-24 DIAGNOSIS — D631 Anemia in chronic kidney disease: Secondary | ICD-10-CM | POA: Diagnosis not present

## 2018-07-24 DIAGNOSIS — E7849 Other hyperlipidemia: Secondary | ICD-10-CM | POA: Diagnosis not present

## 2018-07-24 DIAGNOSIS — N2581 Secondary hyperparathyroidism of renal origin: Secondary | ICD-10-CM | POA: Diagnosis not present

## 2018-07-24 DIAGNOSIS — R82994 Hypercalciuria: Secondary | ICD-10-CM | POA: Diagnosis not present

## 2018-07-24 DIAGNOSIS — N186 End stage renal disease: Secondary | ICD-10-CM | POA: Diagnosis not present

## 2018-07-24 DIAGNOSIS — D509 Iron deficiency anemia, unspecified: Secondary | ICD-10-CM | POA: Diagnosis not present

## 2018-07-24 DIAGNOSIS — N2589 Other disorders resulting from impaired renal tubular function: Secondary | ICD-10-CM | POA: Diagnosis not present

## 2018-07-24 DIAGNOSIS — Z4932 Encounter for adequacy testing for peritoneal dialysis: Secondary | ICD-10-CM | POA: Diagnosis not present

## 2018-07-24 DIAGNOSIS — E1129 Type 2 diabetes mellitus with other diabetic kidney complication: Secondary | ICD-10-CM | POA: Diagnosis not present

## 2018-07-25 DIAGNOSIS — N2581 Secondary hyperparathyroidism of renal origin: Secondary | ICD-10-CM | POA: Diagnosis not present

## 2018-07-25 DIAGNOSIS — D631 Anemia in chronic kidney disease: Secondary | ICD-10-CM | POA: Diagnosis not present

## 2018-07-25 DIAGNOSIS — Z4932 Encounter for adequacy testing for peritoneal dialysis: Secondary | ICD-10-CM | POA: Diagnosis not present

## 2018-07-25 DIAGNOSIS — D509 Iron deficiency anemia, unspecified: Secondary | ICD-10-CM | POA: Diagnosis not present

## 2018-07-25 DIAGNOSIS — N2589 Other disorders resulting from impaired renal tubular function: Secondary | ICD-10-CM | POA: Diagnosis not present

## 2018-07-25 DIAGNOSIS — N186 End stage renal disease: Secondary | ICD-10-CM | POA: Diagnosis not present

## 2018-07-26 DIAGNOSIS — D509 Iron deficiency anemia, unspecified: Secondary | ICD-10-CM | POA: Diagnosis not present

## 2018-07-26 DIAGNOSIS — Z4932 Encounter for adequacy testing for peritoneal dialysis: Secondary | ICD-10-CM | POA: Diagnosis not present

## 2018-07-26 DIAGNOSIS — D631 Anemia in chronic kidney disease: Secondary | ICD-10-CM | POA: Diagnosis not present

## 2018-07-26 DIAGNOSIS — N186 End stage renal disease: Secondary | ICD-10-CM | POA: Diagnosis not present

## 2018-07-26 DIAGNOSIS — N2581 Secondary hyperparathyroidism of renal origin: Secondary | ICD-10-CM | POA: Diagnosis not present

## 2018-07-26 DIAGNOSIS — N2589 Other disorders resulting from impaired renal tubular function: Secondary | ICD-10-CM | POA: Diagnosis not present

## 2018-07-27 DIAGNOSIS — N186 End stage renal disease: Secondary | ICD-10-CM | POA: Diagnosis not present

## 2018-07-27 DIAGNOSIS — D631 Anemia in chronic kidney disease: Secondary | ICD-10-CM | POA: Diagnosis not present

## 2018-07-27 DIAGNOSIS — N2581 Secondary hyperparathyroidism of renal origin: Secondary | ICD-10-CM | POA: Diagnosis not present

## 2018-07-27 DIAGNOSIS — N2589 Other disorders resulting from impaired renal tubular function: Secondary | ICD-10-CM | POA: Diagnosis not present

## 2018-07-27 DIAGNOSIS — Z4932 Encounter for adequacy testing for peritoneal dialysis: Secondary | ICD-10-CM | POA: Diagnosis not present

## 2018-07-27 DIAGNOSIS — D509 Iron deficiency anemia, unspecified: Secondary | ICD-10-CM | POA: Diagnosis not present

## 2018-07-28 DIAGNOSIS — D631 Anemia in chronic kidney disease: Secondary | ICD-10-CM | POA: Diagnosis not present

## 2018-07-28 DIAGNOSIS — N2589 Other disorders resulting from impaired renal tubular function: Secondary | ICD-10-CM | POA: Diagnosis not present

## 2018-07-28 DIAGNOSIS — N2581 Secondary hyperparathyroidism of renal origin: Secondary | ICD-10-CM | POA: Diagnosis not present

## 2018-07-28 DIAGNOSIS — D509 Iron deficiency anemia, unspecified: Secondary | ICD-10-CM | POA: Diagnosis not present

## 2018-07-28 DIAGNOSIS — N186 End stage renal disease: Secondary | ICD-10-CM | POA: Diagnosis not present

## 2018-07-28 DIAGNOSIS — Z4932 Encounter for adequacy testing for peritoneal dialysis: Secondary | ICD-10-CM | POA: Diagnosis not present

## 2018-07-29 DIAGNOSIS — N2589 Other disorders resulting from impaired renal tubular function: Secondary | ICD-10-CM | POA: Diagnosis not present

## 2018-07-29 DIAGNOSIS — Z4932 Encounter for adequacy testing for peritoneal dialysis: Secondary | ICD-10-CM | POA: Diagnosis not present

## 2018-07-29 DIAGNOSIS — N2581 Secondary hyperparathyroidism of renal origin: Secondary | ICD-10-CM | POA: Diagnosis not present

## 2018-07-29 DIAGNOSIS — D631 Anemia in chronic kidney disease: Secondary | ICD-10-CM | POA: Diagnosis not present

## 2018-07-29 DIAGNOSIS — N186 End stage renal disease: Secondary | ICD-10-CM | POA: Diagnosis not present

## 2018-07-29 DIAGNOSIS — D509 Iron deficiency anemia, unspecified: Secondary | ICD-10-CM | POA: Diagnosis not present

## 2018-07-30 DIAGNOSIS — N2589 Other disorders resulting from impaired renal tubular function: Secondary | ICD-10-CM | POA: Diagnosis not present

## 2018-07-30 DIAGNOSIS — D631 Anemia in chronic kidney disease: Secondary | ICD-10-CM | POA: Diagnosis not present

## 2018-07-30 DIAGNOSIS — N2581 Secondary hyperparathyroidism of renal origin: Secondary | ICD-10-CM | POA: Diagnosis not present

## 2018-07-30 DIAGNOSIS — N186 End stage renal disease: Secondary | ICD-10-CM | POA: Diagnosis not present

## 2018-07-30 DIAGNOSIS — Z4932 Encounter for adequacy testing for peritoneal dialysis: Secondary | ICD-10-CM | POA: Diagnosis not present

## 2018-07-30 DIAGNOSIS — D509 Iron deficiency anemia, unspecified: Secondary | ICD-10-CM | POA: Diagnosis not present

## 2018-07-31 DIAGNOSIS — N2589 Other disorders resulting from impaired renal tubular function: Secondary | ICD-10-CM | POA: Diagnosis not present

## 2018-07-31 DIAGNOSIS — Z4932 Encounter for adequacy testing for peritoneal dialysis: Secondary | ICD-10-CM | POA: Diagnosis not present

## 2018-07-31 DIAGNOSIS — D631 Anemia in chronic kidney disease: Secondary | ICD-10-CM | POA: Diagnosis not present

## 2018-07-31 DIAGNOSIS — E785 Hyperlipidemia, unspecified: Secondary | ICD-10-CM | POA: Diagnosis not present

## 2018-07-31 DIAGNOSIS — D509 Iron deficiency anemia, unspecified: Secondary | ICD-10-CM | POA: Diagnosis not present

## 2018-07-31 DIAGNOSIS — N186 End stage renal disease: Secondary | ICD-10-CM | POA: Diagnosis not present

## 2018-07-31 DIAGNOSIS — N2581 Secondary hyperparathyroidism of renal origin: Secondary | ICD-10-CM | POA: Diagnosis not present

## 2018-08-01 DIAGNOSIS — Z4932 Encounter for adequacy testing for peritoneal dialysis: Secondary | ICD-10-CM | POA: Diagnosis not present

## 2018-08-01 DIAGNOSIS — N2589 Other disorders resulting from impaired renal tubular function: Secondary | ICD-10-CM | POA: Diagnosis not present

## 2018-08-01 DIAGNOSIS — N186 End stage renal disease: Secondary | ICD-10-CM | POA: Diagnosis not present

## 2018-08-01 DIAGNOSIS — D509 Iron deficiency anemia, unspecified: Secondary | ICD-10-CM | POA: Diagnosis not present

## 2018-08-01 DIAGNOSIS — N2581 Secondary hyperparathyroidism of renal origin: Secondary | ICD-10-CM | POA: Diagnosis not present

## 2018-08-01 DIAGNOSIS — D631 Anemia in chronic kidney disease: Secondary | ICD-10-CM | POA: Diagnosis not present

## 2018-08-02 DIAGNOSIS — D509 Iron deficiency anemia, unspecified: Secondary | ICD-10-CM | POA: Diagnosis not present

## 2018-08-02 DIAGNOSIS — N186 End stage renal disease: Secondary | ICD-10-CM | POA: Diagnosis not present

## 2018-08-02 DIAGNOSIS — N2589 Other disorders resulting from impaired renal tubular function: Secondary | ICD-10-CM | POA: Diagnosis not present

## 2018-08-02 DIAGNOSIS — N2581 Secondary hyperparathyroidism of renal origin: Secondary | ICD-10-CM | POA: Diagnosis not present

## 2018-08-02 DIAGNOSIS — Z4932 Encounter for adequacy testing for peritoneal dialysis: Secondary | ICD-10-CM | POA: Diagnosis not present

## 2018-08-02 DIAGNOSIS — D631 Anemia in chronic kidney disease: Secondary | ICD-10-CM | POA: Diagnosis not present

## 2018-08-03 DIAGNOSIS — N2589 Other disorders resulting from impaired renal tubular function: Secondary | ICD-10-CM | POA: Diagnosis not present

## 2018-08-03 DIAGNOSIS — D509 Iron deficiency anemia, unspecified: Secondary | ICD-10-CM | POA: Diagnosis not present

## 2018-08-03 DIAGNOSIS — N186 End stage renal disease: Secondary | ICD-10-CM | POA: Diagnosis not present

## 2018-08-03 DIAGNOSIS — Z4932 Encounter for adequacy testing for peritoneal dialysis: Secondary | ICD-10-CM | POA: Diagnosis not present

## 2018-08-03 DIAGNOSIS — D631 Anemia in chronic kidney disease: Secondary | ICD-10-CM | POA: Diagnosis not present

## 2018-08-03 DIAGNOSIS — N2581 Secondary hyperparathyroidism of renal origin: Secondary | ICD-10-CM | POA: Diagnosis not present

## 2018-08-04 DIAGNOSIS — N2581 Secondary hyperparathyroidism of renal origin: Secondary | ICD-10-CM | POA: Diagnosis not present

## 2018-08-04 DIAGNOSIS — Z4932 Encounter for adequacy testing for peritoneal dialysis: Secondary | ICD-10-CM | POA: Diagnosis not present

## 2018-08-04 DIAGNOSIS — D509 Iron deficiency anemia, unspecified: Secondary | ICD-10-CM | POA: Diagnosis not present

## 2018-08-04 DIAGNOSIS — N2589 Other disorders resulting from impaired renal tubular function: Secondary | ICD-10-CM | POA: Diagnosis not present

## 2018-08-04 DIAGNOSIS — N186 End stage renal disease: Secondary | ICD-10-CM | POA: Diagnosis not present

## 2018-08-04 DIAGNOSIS — D631 Anemia in chronic kidney disease: Secondary | ICD-10-CM | POA: Diagnosis not present

## 2018-08-05 DIAGNOSIS — D631 Anemia in chronic kidney disease: Secondary | ICD-10-CM | POA: Diagnosis not present

## 2018-08-05 DIAGNOSIS — N2581 Secondary hyperparathyroidism of renal origin: Secondary | ICD-10-CM | POA: Diagnosis not present

## 2018-08-05 DIAGNOSIS — D509 Iron deficiency anemia, unspecified: Secondary | ICD-10-CM | POA: Diagnosis not present

## 2018-08-05 DIAGNOSIS — Z4932 Encounter for adequacy testing for peritoneal dialysis: Secondary | ICD-10-CM | POA: Diagnosis not present

## 2018-08-05 DIAGNOSIS — N186 End stage renal disease: Secondary | ICD-10-CM | POA: Diagnosis not present

## 2018-08-05 DIAGNOSIS — N2589 Other disorders resulting from impaired renal tubular function: Secondary | ICD-10-CM | POA: Diagnosis not present

## 2018-08-06 DIAGNOSIS — D509 Iron deficiency anemia, unspecified: Secondary | ICD-10-CM | POA: Diagnosis not present

## 2018-08-06 DIAGNOSIS — D631 Anemia in chronic kidney disease: Secondary | ICD-10-CM | POA: Diagnosis not present

## 2018-08-06 DIAGNOSIS — N2589 Other disorders resulting from impaired renal tubular function: Secondary | ICD-10-CM | POA: Diagnosis not present

## 2018-08-06 DIAGNOSIS — Z4932 Encounter for adequacy testing for peritoneal dialysis: Secondary | ICD-10-CM | POA: Diagnosis not present

## 2018-08-06 DIAGNOSIS — N2581 Secondary hyperparathyroidism of renal origin: Secondary | ICD-10-CM | POA: Diagnosis not present

## 2018-08-06 DIAGNOSIS — N186 End stage renal disease: Secondary | ICD-10-CM | POA: Diagnosis not present

## 2018-08-07 DIAGNOSIS — D631 Anemia in chronic kidney disease: Secondary | ICD-10-CM | POA: Diagnosis not present

## 2018-08-07 DIAGNOSIS — D509 Iron deficiency anemia, unspecified: Secondary | ICD-10-CM | POA: Diagnosis not present

## 2018-08-07 DIAGNOSIS — N2589 Other disorders resulting from impaired renal tubular function: Secondary | ICD-10-CM | POA: Diagnosis not present

## 2018-08-07 DIAGNOSIS — Z4932 Encounter for adequacy testing for peritoneal dialysis: Secondary | ICD-10-CM | POA: Diagnosis not present

## 2018-08-07 DIAGNOSIS — N2581 Secondary hyperparathyroidism of renal origin: Secondary | ICD-10-CM | POA: Diagnosis not present

## 2018-08-07 DIAGNOSIS — N186 End stage renal disease: Secondary | ICD-10-CM | POA: Diagnosis not present

## 2018-08-08 DIAGNOSIS — N2589 Other disorders resulting from impaired renal tubular function: Secondary | ICD-10-CM | POA: Diagnosis not present

## 2018-08-08 DIAGNOSIS — D631 Anemia in chronic kidney disease: Secondary | ICD-10-CM | POA: Diagnosis not present

## 2018-08-08 DIAGNOSIS — N186 End stage renal disease: Secondary | ICD-10-CM | POA: Diagnosis not present

## 2018-08-08 DIAGNOSIS — D509 Iron deficiency anemia, unspecified: Secondary | ICD-10-CM | POA: Diagnosis not present

## 2018-08-08 DIAGNOSIS — N2581 Secondary hyperparathyroidism of renal origin: Secondary | ICD-10-CM | POA: Diagnosis not present

## 2018-08-08 DIAGNOSIS — Z4932 Encounter for adequacy testing for peritoneal dialysis: Secondary | ICD-10-CM | POA: Diagnosis not present

## 2018-08-09 DIAGNOSIS — N186 End stage renal disease: Secondary | ICD-10-CM | POA: Diagnosis not present

## 2018-08-09 DIAGNOSIS — N2581 Secondary hyperparathyroidism of renal origin: Secondary | ICD-10-CM | POA: Diagnosis not present

## 2018-08-09 DIAGNOSIS — D631 Anemia in chronic kidney disease: Secondary | ICD-10-CM | POA: Diagnosis not present

## 2018-08-09 DIAGNOSIS — D509 Iron deficiency anemia, unspecified: Secondary | ICD-10-CM | POA: Diagnosis not present

## 2018-08-09 DIAGNOSIS — Z4932 Encounter for adequacy testing for peritoneal dialysis: Secondary | ICD-10-CM | POA: Diagnosis not present

## 2018-08-09 DIAGNOSIS — N2589 Other disorders resulting from impaired renal tubular function: Secondary | ICD-10-CM | POA: Diagnosis not present

## 2018-08-10 DIAGNOSIS — N2589 Other disorders resulting from impaired renal tubular function: Secondary | ICD-10-CM | POA: Diagnosis not present

## 2018-08-10 DIAGNOSIS — N2581 Secondary hyperparathyroidism of renal origin: Secondary | ICD-10-CM | POA: Diagnosis not present

## 2018-08-10 DIAGNOSIS — Z4932 Encounter for adequacy testing for peritoneal dialysis: Secondary | ICD-10-CM | POA: Diagnosis not present

## 2018-08-10 DIAGNOSIS — N186 End stage renal disease: Secondary | ICD-10-CM | POA: Diagnosis not present

## 2018-08-10 DIAGNOSIS — D631 Anemia in chronic kidney disease: Secondary | ICD-10-CM | POA: Diagnosis not present

## 2018-08-10 DIAGNOSIS — D509 Iron deficiency anemia, unspecified: Secondary | ICD-10-CM | POA: Diagnosis not present

## 2018-08-11 DIAGNOSIS — D631 Anemia in chronic kidney disease: Secondary | ICD-10-CM | POA: Diagnosis not present

## 2018-08-11 DIAGNOSIS — D509 Iron deficiency anemia, unspecified: Secondary | ICD-10-CM | POA: Diagnosis not present

## 2018-08-11 DIAGNOSIS — N2581 Secondary hyperparathyroidism of renal origin: Secondary | ICD-10-CM | POA: Diagnosis not present

## 2018-08-11 DIAGNOSIS — Z4932 Encounter for adequacy testing for peritoneal dialysis: Secondary | ICD-10-CM | POA: Diagnosis not present

## 2018-08-11 DIAGNOSIS — N2589 Other disorders resulting from impaired renal tubular function: Secondary | ICD-10-CM | POA: Diagnosis not present

## 2018-08-11 DIAGNOSIS — N186 End stage renal disease: Secondary | ICD-10-CM | POA: Diagnosis not present

## 2018-08-12 DIAGNOSIS — Z4932 Encounter for adequacy testing for peritoneal dialysis: Secondary | ICD-10-CM | POA: Diagnosis not present

## 2018-08-12 DIAGNOSIS — N2589 Other disorders resulting from impaired renal tubular function: Secondary | ICD-10-CM | POA: Diagnosis not present

## 2018-08-12 DIAGNOSIS — N186 End stage renal disease: Secondary | ICD-10-CM | POA: Diagnosis not present

## 2018-08-12 DIAGNOSIS — D509 Iron deficiency anemia, unspecified: Secondary | ICD-10-CM | POA: Diagnosis not present

## 2018-08-12 DIAGNOSIS — D631 Anemia in chronic kidney disease: Secondary | ICD-10-CM | POA: Diagnosis not present

## 2018-08-12 DIAGNOSIS — N2581 Secondary hyperparathyroidism of renal origin: Secondary | ICD-10-CM | POA: Diagnosis not present

## 2018-08-13 DIAGNOSIS — D509 Iron deficiency anemia, unspecified: Secondary | ICD-10-CM | POA: Diagnosis not present

## 2018-08-13 DIAGNOSIS — N2581 Secondary hyperparathyroidism of renal origin: Secondary | ICD-10-CM | POA: Diagnosis not present

## 2018-08-13 DIAGNOSIS — D631 Anemia in chronic kidney disease: Secondary | ICD-10-CM | POA: Diagnosis not present

## 2018-08-13 DIAGNOSIS — N2589 Other disorders resulting from impaired renal tubular function: Secondary | ICD-10-CM | POA: Diagnosis not present

## 2018-08-13 DIAGNOSIS — N186 End stage renal disease: Secondary | ICD-10-CM | POA: Diagnosis not present

## 2018-08-13 DIAGNOSIS — Z4932 Encounter for adequacy testing for peritoneal dialysis: Secondary | ICD-10-CM | POA: Diagnosis not present

## 2018-08-14 DIAGNOSIS — N2581 Secondary hyperparathyroidism of renal origin: Secondary | ICD-10-CM | POA: Diagnosis not present

## 2018-08-14 DIAGNOSIS — N2589 Other disorders resulting from impaired renal tubular function: Secondary | ICD-10-CM | POA: Diagnosis not present

## 2018-08-14 DIAGNOSIS — D631 Anemia in chronic kidney disease: Secondary | ICD-10-CM | POA: Diagnosis not present

## 2018-08-14 DIAGNOSIS — Z4932 Encounter for adequacy testing for peritoneal dialysis: Secondary | ICD-10-CM | POA: Diagnosis not present

## 2018-08-14 DIAGNOSIS — D509 Iron deficiency anemia, unspecified: Secondary | ICD-10-CM | POA: Diagnosis not present

## 2018-08-14 DIAGNOSIS — N186 End stage renal disease: Secondary | ICD-10-CM | POA: Diagnosis not present

## 2018-08-15 DIAGNOSIS — N2589 Other disorders resulting from impaired renal tubular function: Secondary | ICD-10-CM | POA: Diagnosis not present

## 2018-08-15 DIAGNOSIS — N2581 Secondary hyperparathyroidism of renal origin: Secondary | ICD-10-CM | POA: Diagnosis not present

## 2018-08-15 DIAGNOSIS — N186 End stage renal disease: Secondary | ICD-10-CM | POA: Diagnosis not present

## 2018-08-15 DIAGNOSIS — Z4932 Encounter for adequacy testing for peritoneal dialysis: Secondary | ICD-10-CM | POA: Diagnosis not present

## 2018-08-15 DIAGNOSIS — D509 Iron deficiency anemia, unspecified: Secondary | ICD-10-CM | POA: Diagnosis not present

## 2018-08-15 DIAGNOSIS — D631 Anemia in chronic kidney disease: Secondary | ICD-10-CM | POA: Diagnosis not present

## 2018-08-16 DIAGNOSIS — D631 Anemia in chronic kidney disease: Secondary | ICD-10-CM | POA: Diagnosis not present

## 2018-08-16 DIAGNOSIS — N186 End stage renal disease: Secondary | ICD-10-CM | POA: Diagnosis not present

## 2018-08-16 DIAGNOSIS — N2589 Other disorders resulting from impaired renal tubular function: Secondary | ICD-10-CM | POA: Diagnosis not present

## 2018-08-16 DIAGNOSIS — Z4932 Encounter for adequacy testing for peritoneal dialysis: Secondary | ICD-10-CM | POA: Diagnosis not present

## 2018-08-16 DIAGNOSIS — N2581 Secondary hyperparathyroidism of renal origin: Secondary | ICD-10-CM | POA: Diagnosis not present

## 2018-08-16 DIAGNOSIS — D509 Iron deficiency anemia, unspecified: Secondary | ICD-10-CM | POA: Diagnosis not present

## 2018-08-17 DIAGNOSIS — N186 End stage renal disease: Secondary | ICD-10-CM | POA: Diagnosis not present

## 2018-08-17 DIAGNOSIS — D631 Anemia in chronic kidney disease: Secondary | ICD-10-CM | POA: Diagnosis not present

## 2018-08-17 DIAGNOSIS — N2581 Secondary hyperparathyroidism of renal origin: Secondary | ICD-10-CM | POA: Diagnosis not present

## 2018-08-17 DIAGNOSIS — N2589 Other disorders resulting from impaired renal tubular function: Secondary | ICD-10-CM | POA: Diagnosis not present

## 2018-08-17 DIAGNOSIS — D509 Iron deficiency anemia, unspecified: Secondary | ICD-10-CM | POA: Diagnosis not present

## 2018-08-17 DIAGNOSIS — Z4932 Encounter for adequacy testing for peritoneal dialysis: Secondary | ICD-10-CM | POA: Diagnosis not present

## 2018-08-18 DIAGNOSIS — N2589 Other disorders resulting from impaired renal tubular function: Secondary | ICD-10-CM | POA: Diagnosis not present

## 2018-08-18 DIAGNOSIS — D509 Iron deficiency anemia, unspecified: Secondary | ICD-10-CM | POA: Diagnosis not present

## 2018-08-18 DIAGNOSIS — D631 Anemia in chronic kidney disease: Secondary | ICD-10-CM | POA: Diagnosis not present

## 2018-08-18 DIAGNOSIS — Z4932 Encounter for adequacy testing for peritoneal dialysis: Secondary | ICD-10-CM | POA: Diagnosis not present

## 2018-08-18 DIAGNOSIS — N186 End stage renal disease: Secondary | ICD-10-CM | POA: Diagnosis not present

## 2018-08-18 DIAGNOSIS — N2581 Secondary hyperparathyroidism of renal origin: Secondary | ICD-10-CM | POA: Diagnosis not present

## 2018-08-19 DIAGNOSIS — N186 End stage renal disease: Secondary | ICD-10-CM | POA: Diagnosis not present

## 2018-08-19 DIAGNOSIS — D631 Anemia in chronic kidney disease: Secondary | ICD-10-CM | POA: Diagnosis not present

## 2018-08-19 DIAGNOSIS — N2589 Other disorders resulting from impaired renal tubular function: Secondary | ICD-10-CM | POA: Diagnosis not present

## 2018-08-19 DIAGNOSIS — N2581 Secondary hyperparathyroidism of renal origin: Secondary | ICD-10-CM | POA: Diagnosis not present

## 2018-08-19 DIAGNOSIS — Z4932 Encounter for adequacy testing for peritoneal dialysis: Secondary | ICD-10-CM | POA: Diagnosis not present

## 2018-08-19 DIAGNOSIS — D509 Iron deficiency anemia, unspecified: Secondary | ICD-10-CM | POA: Diagnosis not present

## 2018-08-20 DIAGNOSIS — N2581 Secondary hyperparathyroidism of renal origin: Secondary | ICD-10-CM | POA: Diagnosis not present

## 2018-08-20 DIAGNOSIS — D631 Anemia in chronic kidney disease: Secondary | ICD-10-CM | POA: Diagnosis not present

## 2018-08-20 DIAGNOSIS — D509 Iron deficiency anemia, unspecified: Secondary | ICD-10-CM | POA: Diagnosis not present

## 2018-08-20 DIAGNOSIS — N2589 Other disorders resulting from impaired renal tubular function: Secondary | ICD-10-CM | POA: Diagnosis not present

## 2018-08-20 DIAGNOSIS — N186 End stage renal disease: Secondary | ICD-10-CM | POA: Diagnosis not present

## 2018-08-20 DIAGNOSIS — Z4932 Encounter for adequacy testing for peritoneal dialysis: Secondary | ICD-10-CM | POA: Diagnosis not present

## 2018-08-21 DIAGNOSIS — N2589 Other disorders resulting from impaired renal tubular function: Secondary | ICD-10-CM | POA: Diagnosis not present

## 2018-08-21 DIAGNOSIS — D631 Anemia in chronic kidney disease: Secondary | ICD-10-CM | POA: Diagnosis not present

## 2018-08-21 DIAGNOSIS — N2581 Secondary hyperparathyroidism of renal origin: Secondary | ICD-10-CM | POA: Diagnosis not present

## 2018-08-21 DIAGNOSIS — N186 End stage renal disease: Secondary | ICD-10-CM | POA: Diagnosis not present

## 2018-08-21 DIAGNOSIS — Z4932 Encounter for adequacy testing for peritoneal dialysis: Secondary | ICD-10-CM | POA: Diagnosis not present

## 2018-08-21 DIAGNOSIS — D509 Iron deficiency anemia, unspecified: Secondary | ICD-10-CM | POA: Diagnosis not present

## 2018-08-22 DIAGNOSIS — N186 End stage renal disease: Secondary | ICD-10-CM | POA: Diagnosis not present

## 2018-08-22 DIAGNOSIS — N2581 Secondary hyperparathyroidism of renal origin: Secondary | ICD-10-CM | POA: Diagnosis not present

## 2018-08-22 DIAGNOSIS — D509 Iron deficiency anemia, unspecified: Secondary | ICD-10-CM | POA: Diagnosis not present

## 2018-08-22 DIAGNOSIS — N2589 Other disorders resulting from impaired renal tubular function: Secondary | ICD-10-CM | POA: Diagnosis not present

## 2018-08-22 DIAGNOSIS — Z4932 Encounter for adequacy testing for peritoneal dialysis: Secondary | ICD-10-CM | POA: Diagnosis not present

## 2018-08-22 DIAGNOSIS — D631 Anemia in chronic kidney disease: Secondary | ICD-10-CM | POA: Diagnosis not present

## 2018-08-23 DIAGNOSIS — E440 Moderate protein-calorie malnutrition: Secondary | ICD-10-CM | POA: Diagnosis not present

## 2018-08-23 DIAGNOSIS — D631 Anemia in chronic kidney disease: Secondary | ICD-10-CM | POA: Diagnosis not present

## 2018-08-23 DIAGNOSIS — D509 Iron deficiency anemia, unspecified: Secondary | ICD-10-CM | POA: Diagnosis not present

## 2018-08-23 DIAGNOSIS — Z4932 Encounter for adequacy testing for peritoneal dialysis: Secondary | ICD-10-CM | POA: Diagnosis not present

## 2018-08-23 DIAGNOSIS — N186 End stage renal disease: Secondary | ICD-10-CM | POA: Diagnosis not present

## 2018-08-23 DIAGNOSIS — Z992 Dependence on renal dialysis: Secondary | ICD-10-CM | POA: Diagnosis not present

## 2018-08-23 DIAGNOSIS — I129 Hypertensive chronic kidney disease with stage 1 through stage 4 chronic kidney disease, or unspecified chronic kidney disease: Secondary | ICD-10-CM | POA: Diagnosis not present

## 2018-08-23 DIAGNOSIS — Z79899 Other long term (current) drug therapy: Secondary | ICD-10-CM | POA: Diagnosis not present

## 2018-08-23 DIAGNOSIS — R17 Unspecified jaundice: Secondary | ICD-10-CM | POA: Diagnosis not present

## 2018-08-23 DIAGNOSIS — N2581 Secondary hyperparathyroidism of renal origin: Secondary | ICD-10-CM | POA: Diagnosis not present

## 2018-08-24 DIAGNOSIS — Z4932 Encounter for adequacy testing for peritoneal dialysis: Secondary | ICD-10-CM | POA: Diagnosis not present

## 2018-08-24 DIAGNOSIS — D631 Anemia in chronic kidney disease: Secondary | ICD-10-CM | POA: Diagnosis not present

## 2018-08-24 DIAGNOSIS — E440 Moderate protein-calorie malnutrition: Secondary | ICD-10-CM | POA: Diagnosis not present

## 2018-08-24 DIAGNOSIS — D509 Iron deficiency anemia, unspecified: Secondary | ICD-10-CM | POA: Diagnosis not present

## 2018-08-24 DIAGNOSIS — N2581 Secondary hyperparathyroidism of renal origin: Secondary | ICD-10-CM | POA: Diagnosis not present

## 2018-08-24 DIAGNOSIS — N186 End stage renal disease: Secondary | ICD-10-CM | POA: Diagnosis not present

## 2018-08-25 DIAGNOSIS — N2581 Secondary hyperparathyroidism of renal origin: Secondary | ICD-10-CM | POA: Diagnosis not present

## 2018-08-25 DIAGNOSIS — N186 End stage renal disease: Secondary | ICD-10-CM | POA: Diagnosis not present

## 2018-08-25 DIAGNOSIS — D631 Anemia in chronic kidney disease: Secondary | ICD-10-CM | POA: Diagnosis not present

## 2018-08-25 DIAGNOSIS — E440 Moderate protein-calorie malnutrition: Secondary | ICD-10-CM | POA: Diagnosis not present

## 2018-08-25 DIAGNOSIS — Z4932 Encounter for adequacy testing for peritoneal dialysis: Secondary | ICD-10-CM | POA: Diagnosis not present

## 2018-08-25 DIAGNOSIS — D509 Iron deficiency anemia, unspecified: Secondary | ICD-10-CM | POA: Diagnosis not present

## 2018-08-26 DIAGNOSIS — N186 End stage renal disease: Secondary | ICD-10-CM | POA: Diagnosis not present

## 2018-08-26 DIAGNOSIS — D509 Iron deficiency anemia, unspecified: Secondary | ICD-10-CM | POA: Diagnosis not present

## 2018-08-26 DIAGNOSIS — E440 Moderate protein-calorie malnutrition: Secondary | ICD-10-CM | POA: Diagnosis not present

## 2018-08-26 DIAGNOSIS — Z4932 Encounter for adequacy testing for peritoneal dialysis: Secondary | ICD-10-CM | POA: Diagnosis not present

## 2018-08-26 DIAGNOSIS — R82994 Hypercalciuria: Secondary | ICD-10-CM | POA: Diagnosis not present

## 2018-08-26 DIAGNOSIS — N2581 Secondary hyperparathyroidism of renal origin: Secondary | ICD-10-CM | POA: Diagnosis not present

## 2018-08-26 DIAGNOSIS — D631 Anemia in chronic kidney disease: Secondary | ICD-10-CM | POA: Diagnosis not present

## 2018-08-27 DIAGNOSIS — D509 Iron deficiency anemia, unspecified: Secondary | ICD-10-CM | POA: Diagnosis not present

## 2018-08-27 DIAGNOSIS — N2581 Secondary hyperparathyroidism of renal origin: Secondary | ICD-10-CM | POA: Diagnosis not present

## 2018-08-27 DIAGNOSIS — N186 End stage renal disease: Secondary | ICD-10-CM | POA: Diagnosis not present

## 2018-08-27 DIAGNOSIS — E440 Moderate protein-calorie malnutrition: Secondary | ICD-10-CM | POA: Diagnosis not present

## 2018-08-27 DIAGNOSIS — Z4932 Encounter for adequacy testing for peritoneal dialysis: Secondary | ICD-10-CM | POA: Diagnosis not present

## 2018-08-27 DIAGNOSIS — D631 Anemia in chronic kidney disease: Secondary | ICD-10-CM | POA: Diagnosis not present

## 2018-08-28 DIAGNOSIS — N186 End stage renal disease: Secondary | ICD-10-CM | POA: Diagnosis not present

## 2018-08-28 DIAGNOSIS — Z4932 Encounter for adequacy testing for peritoneal dialysis: Secondary | ICD-10-CM | POA: Diagnosis not present

## 2018-08-28 DIAGNOSIS — D509 Iron deficiency anemia, unspecified: Secondary | ICD-10-CM | POA: Diagnosis not present

## 2018-08-28 DIAGNOSIS — D631 Anemia in chronic kidney disease: Secondary | ICD-10-CM | POA: Diagnosis not present

## 2018-08-28 DIAGNOSIS — E440 Moderate protein-calorie malnutrition: Secondary | ICD-10-CM | POA: Diagnosis not present

## 2018-08-28 DIAGNOSIS — N2581 Secondary hyperparathyroidism of renal origin: Secondary | ICD-10-CM | POA: Diagnosis not present

## 2018-08-29 DIAGNOSIS — N186 End stage renal disease: Secondary | ICD-10-CM | POA: Diagnosis not present

## 2018-08-29 DIAGNOSIS — N2581 Secondary hyperparathyroidism of renal origin: Secondary | ICD-10-CM | POA: Diagnosis not present

## 2018-08-29 DIAGNOSIS — E440 Moderate protein-calorie malnutrition: Secondary | ICD-10-CM | POA: Diagnosis not present

## 2018-08-29 DIAGNOSIS — D509 Iron deficiency anemia, unspecified: Secondary | ICD-10-CM | POA: Diagnosis not present

## 2018-08-29 DIAGNOSIS — D631 Anemia in chronic kidney disease: Secondary | ICD-10-CM | POA: Diagnosis not present

## 2018-08-29 DIAGNOSIS — Z4932 Encounter for adequacy testing for peritoneal dialysis: Secondary | ICD-10-CM | POA: Diagnosis not present

## 2018-08-30 DIAGNOSIS — N2581 Secondary hyperparathyroidism of renal origin: Secondary | ICD-10-CM | POA: Diagnosis not present

## 2018-08-30 DIAGNOSIS — E440 Moderate protein-calorie malnutrition: Secondary | ICD-10-CM | POA: Diagnosis not present

## 2018-08-30 DIAGNOSIS — Z4932 Encounter for adequacy testing for peritoneal dialysis: Secondary | ICD-10-CM | POA: Diagnosis not present

## 2018-08-30 DIAGNOSIS — N186 End stage renal disease: Secondary | ICD-10-CM | POA: Diagnosis not present

## 2018-08-30 DIAGNOSIS — D509 Iron deficiency anemia, unspecified: Secondary | ICD-10-CM | POA: Diagnosis not present

## 2018-08-30 DIAGNOSIS — D631 Anemia in chronic kidney disease: Secondary | ICD-10-CM | POA: Diagnosis not present

## 2018-08-31 DIAGNOSIS — N2581 Secondary hyperparathyroidism of renal origin: Secondary | ICD-10-CM | POA: Diagnosis not present

## 2018-08-31 DIAGNOSIS — D509 Iron deficiency anemia, unspecified: Secondary | ICD-10-CM | POA: Diagnosis not present

## 2018-08-31 DIAGNOSIS — D631 Anemia in chronic kidney disease: Secondary | ICD-10-CM | POA: Diagnosis not present

## 2018-08-31 DIAGNOSIS — E440 Moderate protein-calorie malnutrition: Secondary | ICD-10-CM | POA: Diagnosis not present

## 2018-08-31 DIAGNOSIS — Z4932 Encounter for adequacy testing for peritoneal dialysis: Secondary | ICD-10-CM | POA: Diagnosis not present

## 2018-08-31 DIAGNOSIS — N186 End stage renal disease: Secondary | ICD-10-CM | POA: Diagnosis not present

## 2018-09-01 DIAGNOSIS — Z4932 Encounter for adequacy testing for peritoneal dialysis: Secondary | ICD-10-CM | POA: Diagnosis not present

## 2018-09-01 DIAGNOSIS — D509 Iron deficiency anemia, unspecified: Secondary | ICD-10-CM | POA: Diagnosis not present

## 2018-09-01 DIAGNOSIS — N186 End stage renal disease: Secondary | ICD-10-CM | POA: Diagnosis not present

## 2018-09-01 DIAGNOSIS — D631 Anemia in chronic kidney disease: Secondary | ICD-10-CM | POA: Diagnosis not present

## 2018-09-01 DIAGNOSIS — E440 Moderate protein-calorie malnutrition: Secondary | ICD-10-CM | POA: Diagnosis not present

## 2018-09-01 DIAGNOSIS — N2581 Secondary hyperparathyroidism of renal origin: Secondary | ICD-10-CM | POA: Diagnosis not present

## 2018-09-02 DIAGNOSIS — D509 Iron deficiency anemia, unspecified: Secondary | ICD-10-CM | POA: Diagnosis not present

## 2018-09-02 DIAGNOSIS — N2581 Secondary hyperparathyroidism of renal origin: Secondary | ICD-10-CM | POA: Diagnosis not present

## 2018-09-02 DIAGNOSIS — E440 Moderate protein-calorie malnutrition: Secondary | ICD-10-CM | POA: Diagnosis not present

## 2018-09-02 DIAGNOSIS — D631 Anemia in chronic kidney disease: Secondary | ICD-10-CM | POA: Diagnosis not present

## 2018-09-02 DIAGNOSIS — Z4932 Encounter for adequacy testing for peritoneal dialysis: Secondary | ICD-10-CM | POA: Diagnosis not present

## 2018-09-02 DIAGNOSIS — N186 End stage renal disease: Secondary | ICD-10-CM | POA: Diagnosis not present

## 2018-09-03 DIAGNOSIS — Z4932 Encounter for adequacy testing for peritoneal dialysis: Secondary | ICD-10-CM | POA: Diagnosis not present

## 2018-09-03 DIAGNOSIS — N2581 Secondary hyperparathyroidism of renal origin: Secondary | ICD-10-CM | POA: Diagnosis not present

## 2018-09-03 DIAGNOSIS — E440 Moderate protein-calorie malnutrition: Secondary | ICD-10-CM | POA: Diagnosis not present

## 2018-09-03 DIAGNOSIS — D631 Anemia in chronic kidney disease: Secondary | ICD-10-CM | POA: Diagnosis not present

## 2018-09-03 DIAGNOSIS — D509 Iron deficiency anemia, unspecified: Secondary | ICD-10-CM | POA: Diagnosis not present

## 2018-09-03 DIAGNOSIS — N186 End stage renal disease: Secondary | ICD-10-CM | POA: Diagnosis not present

## 2018-09-04 DIAGNOSIS — D509 Iron deficiency anemia, unspecified: Secondary | ICD-10-CM | POA: Diagnosis not present

## 2018-09-04 DIAGNOSIS — E440 Moderate protein-calorie malnutrition: Secondary | ICD-10-CM | POA: Diagnosis not present

## 2018-09-04 DIAGNOSIS — R0981 Nasal congestion: Secondary | ICD-10-CM | POA: Diagnosis not present

## 2018-09-04 DIAGNOSIS — N2581 Secondary hyperparathyroidism of renal origin: Secondary | ICD-10-CM | POA: Diagnosis not present

## 2018-09-04 DIAGNOSIS — R51 Headache: Secondary | ICD-10-CM | POA: Diagnosis not present

## 2018-09-04 DIAGNOSIS — E782 Mixed hyperlipidemia: Secondary | ICD-10-CM | POA: Diagnosis not present

## 2018-09-04 DIAGNOSIS — N186 End stage renal disease: Secondary | ICD-10-CM | POA: Diagnosis not present

## 2018-09-04 DIAGNOSIS — D631 Anemia in chronic kidney disease: Secondary | ICD-10-CM | POA: Diagnosis not present

## 2018-09-04 DIAGNOSIS — I10 Essential (primary) hypertension: Secondary | ICD-10-CM | POA: Diagnosis not present

## 2018-09-04 DIAGNOSIS — Z4932 Encounter for adequacy testing for peritoneal dialysis: Secondary | ICD-10-CM | POA: Diagnosis not present

## 2018-09-05 DIAGNOSIS — N186 End stage renal disease: Secondary | ICD-10-CM | POA: Diagnosis not present

## 2018-09-05 DIAGNOSIS — E440 Moderate protein-calorie malnutrition: Secondary | ICD-10-CM | POA: Diagnosis not present

## 2018-09-05 DIAGNOSIS — Z4932 Encounter for adequacy testing for peritoneal dialysis: Secondary | ICD-10-CM | POA: Diagnosis not present

## 2018-09-05 DIAGNOSIS — D631 Anemia in chronic kidney disease: Secondary | ICD-10-CM | POA: Diagnosis not present

## 2018-09-05 DIAGNOSIS — D509 Iron deficiency anemia, unspecified: Secondary | ICD-10-CM | POA: Diagnosis not present

## 2018-09-05 DIAGNOSIS — N2581 Secondary hyperparathyroidism of renal origin: Secondary | ICD-10-CM | POA: Diagnosis not present

## 2018-09-06 DIAGNOSIS — E440 Moderate protein-calorie malnutrition: Secondary | ICD-10-CM | POA: Diagnosis not present

## 2018-09-06 DIAGNOSIS — N186 End stage renal disease: Secondary | ICD-10-CM | POA: Diagnosis not present

## 2018-09-06 DIAGNOSIS — D631 Anemia in chronic kidney disease: Secondary | ICD-10-CM | POA: Diagnosis not present

## 2018-09-06 DIAGNOSIS — D509 Iron deficiency anemia, unspecified: Secondary | ICD-10-CM | POA: Diagnosis not present

## 2018-09-06 DIAGNOSIS — Z4932 Encounter for adequacy testing for peritoneal dialysis: Secondary | ICD-10-CM | POA: Diagnosis not present

## 2018-09-06 DIAGNOSIS — N2581 Secondary hyperparathyroidism of renal origin: Secondary | ICD-10-CM | POA: Diagnosis not present

## 2018-09-07 DIAGNOSIS — D509 Iron deficiency anemia, unspecified: Secondary | ICD-10-CM | POA: Diagnosis not present

## 2018-09-07 DIAGNOSIS — Z4932 Encounter for adequacy testing for peritoneal dialysis: Secondary | ICD-10-CM | POA: Diagnosis not present

## 2018-09-07 DIAGNOSIS — E440 Moderate protein-calorie malnutrition: Secondary | ICD-10-CM | POA: Diagnosis not present

## 2018-09-07 DIAGNOSIS — D631 Anemia in chronic kidney disease: Secondary | ICD-10-CM | POA: Diagnosis not present

## 2018-09-07 DIAGNOSIS — N186 End stage renal disease: Secondary | ICD-10-CM | POA: Diagnosis not present

## 2018-09-07 DIAGNOSIS — N2581 Secondary hyperparathyroidism of renal origin: Secondary | ICD-10-CM | POA: Diagnosis not present

## 2018-09-08 DIAGNOSIS — D631 Anemia in chronic kidney disease: Secondary | ICD-10-CM | POA: Diagnosis not present

## 2018-09-08 DIAGNOSIS — N2581 Secondary hyperparathyroidism of renal origin: Secondary | ICD-10-CM | POA: Diagnosis not present

## 2018-09-08 DIAGNOSIS — Z4932 Encounter for adequacy testing for peritoneal dialysis: Secondary | ICD-10-CM | POA: Diagnosis not present

## 2018-09-08 DIAGNOSIS — I120 Hypertensive chronic kidney disease with stage 5 chronic kidney disease or end stage renal disease: Secondary | ICD-10-CM | POA: Diagnosis not present

## 2018-09-08 DIAGNOSIS — B182 Chronic viral hepatitis C: Secondary | ICD-10-CM | POA: Diagnosis not present

## 2018-09-08 DIAGNOSIS — N186 End stage renal disease: Secondary | ICD-10-CM | POA: Diagnosis not present

## 2018-09-08 DIAGNOSIS — E440 Moderate protein-calorie malnutrition: Secondary | ICD-10-CM | POA: Diagnosis not present

## 2018-09-08 DIAGNOSIS — D509 Iron deficiency anemia, unspecified: Secondary | ICD-10-CM | POA: Diagnosis not present

## 2018-09-08 DIAGNOSIS — E785 Hyperlipidemia, unspecified: Secondary | ICD-10-CM | POA: Diagnosis not present

## 2018-09-08 DIAGNOSIS — Z79899 Other long term (current) drug therapy: Secondary | ICD-10-CM | POA: Diagnosis not present

## 2018-09-09 DIAGNOSIS — N186 End stage renal disease: Secondary | ICD-10-CM | POA: Diagnosis not present

## 2018-09-09 DIAGNOSIS — D509 Iron deficiency anemia, unspecified: Secondary | ICD-10-CM | POA: Diagnosis not present

## 2018-09-09 DIAGNOSIS — N2581 Secondary hyperparathyroidism of renal origin: Secondary | ICD-10-CM | POA: Diagnosis not present

## 2018-09-09 DIAGNOSIS — Z4932 Encounter for adequacy testing for peritoneal dialysis: Secondary | ICD-10-CM | POA: Diagnosis not present

## 2018-09-09 DIAGNOSIS — E440 Moderate protein-calorie malnutrition: Secondary | ICD-10-CM | POA: Diagnosis not present

## 2018-09-09 DIAGNOSIS — D631 Anemia in chronic kidney disease: Secondary | ICD-10-CM | POA: Diagnosis not present

## 2018-09-10 DIAGNOSIS — Z4932 Encounter for adequacy testing for peritoneal dialysis: Secondary | ICD-10-CM | POA: Diagnosis not present

## 2018-09-10 DIAGNOSIS — D631 Anemia in chronic kidney disease: Secondary | ICD-10-CM | POA: Diagnosis not present

## 2018-09-10 DIAGNOSIS — E440 Moderate protein-calorie malnutrition: Secondary | ICD-10-CM | POA: Diagnosis not present

## 2018-09-10 DIAGNOSIS — N186 End stage renal disease: Secondary | ICD-10-CM | POA: Diagnosis not present

## 2018-09-10 DIAGNOSIS — N2581 Secondary hyperparathyroidism of renal origin: Secondary | ICD-10-CM | POA: Diagnosis not present

## 2018-09-10 DIAGNOSIS — D509 Iron deficiency anemia, unspecified: Secondary | ICD-10-CM | POA: Diagnosis not present

## 2018-09-11 DIAGNOSIS — Z4932 Encounter for adequacy testing for peritoneal dialysis: Secondary | ICD-10-CM | POA: Diagnosis not present

## 2018-09-11 DIAGNOSIS — D509 Iron deficiency anemia, unspecified: Secondary | ICD-10-CM | POA: Diagnosis not present

## 2018-09-11 DIAGNOSIS — N2581 Secondary hyperparathyroidism of renal origin: Secondary | ICD-10-CM | POA: Diagnosis not present

## 2018-09-11 DIAGNOSIS — D631 Anemia in chronic kidney disease: Secondary | ICD-10-CM | POA: Diagnosis not present

## 2018-09-11 DIAGNOSIS — E440 Moderate protein-calorie malnutrition: Secondary | ICD-10-CM | POA: Diagnosis not present

## 2018-09-11 DIAGNOSIS — N186 End stage renal disease: Secondary | ICD-10-CM | POA: Diagnosis not present

## 2018-09-12 DIAGNOSIS — D509 Iron deficiency anemia, unspecified: Secondary | ICD-10-CM | POA: Diagnosis not present

## 2018-09-12 DIAGNOSIS — N2581 Secondary hyperparathyroidism of renal origin: Secondary | ICD-10-CM | POA: Diagnosis not present

## 2018-09-12 DIAGNOSIS — Z4932 Encounter for adequacy testing for peritoneal dialysis: Secondary | ICD-10-CM | POA: Diagnosis not present

## 2018-09-12 DIAGNOSIS — D631 Anemia in chronic kidney disease: Secondary | ICD-10-CM | POA: Diagnosis not present

## 2018-09-12 DIAGNOSIS — E440 Moderate protein-calorie malnutrition: Secondary | ICD-10-CM | POA: Diagnosis not present

## 2018-09-12 DIAGNOSIS — N186 End stage renal disease: Secondary | ICD-10-CM | POA: Diagnosis not present

## 2018-09-13 DIAGNOSIS — D509 Iron deficiency anemia, unspecified: Secondary | ICD-10-CM | POA: Diagnosis not present

## 2018-09-13 DIAGNOSIS — Z4932 Encounter for adequacy testing for peritoneal dialysis: Secondary | ICD-10-CM | POA: Diagnosis not present

## 2018-09-13 DIAGNOSIS — N2581 Secondary hyperparathyroidism of renal origin: Secondary | ICD-10-CM | POA: Diagnosis not present

## 2018-09-13 DIAGNOSIS — N186 End stage renal disease: Secondary | ICD-10-CM | POA: Diagnosis not present

## 2018-09-13 DIAGNOSIS — E440 Moderate protein-calorie malnutrition: Secondary | ICD-10-CM | POA: Diagnosis not present

## 2018-09-13 DIAGNOSIS — D631 Anemia in chronic kidney disease: Secondary | ICD-10-CM | POA: Diagnosis not present

## 2018-09-14 DIAGNOSIS — N186 End stage renal disease: Secondary | ICD-10-CM | POA: Diagnosis not present

## 2018-09-14 DIAGNOSIS — E440 Moderate protein-calorie malnutrition: Secondary | ICD-10-CM | POA: Diagnosis not present

## 2018-09-14 DIAGNOSIS — N2581 Secondary hyperparathyroidism of renal origin: Secondary | ICD-10-CM | POA: Diagnosis not present

## 2018-09-14 DIAGNOSIS — Z4932 Encounter for adequacy testing for peritoneal dialysis: Secondary | ICD-10-CM | POA: Diagnosis not present

## 2018-09-14 DIAGNOSIS — D631 Anemia in chronic kidney disease: Secondary | ICD-10-CM | POA: Diagnosis not present

## 2018-09-14 DIAGNOSIS — D509 Iron deficiency anemia, unspecified: Secondary | ICD-10-CM | POA: Diagnosis not present

## 2018-09-15 DIAGNOSIS — D509 Iron deficiency anemia, unspecified: Secondary | ICD-10-CM | POA: Diagnosis not present

## 2018-09-15 DIAGNOSIS — Z4932 Encounter for adequacy testing for peritoneal dialysis: Secondary | ICD-10-CM | POA: Diagnosis not present

## 2018-09-15 DIAGNOSIS — D631 Anemia in chronic kidney disease: Secondary | ICD-10-CM | POA: Diagnosis not present

## 2018-09-15 DIAGNOSIS — N186 End stage renal disease: Secondary | ICD-10-CM | POA: Diagnosis not present

## 2018-09-15 DIAGNOSIS — N2581 Secondary hyperparathyroidism of renal origin: Secondary | ICD-10-CM | POA: Diagnosis not present

## 2018-09-15 DIAGNOSIS — E440 Moderate protein-calorie malnutrition: Secondary | ICD-10-CM | POA: Diagnosis not present

## 2018-09-16 DIAGNOSIS — D631 Anemia in chronic kidney disease: Secondary | ICD-10-CM | POA: Diagnosis not present

## 2018-09-16 DIAGNOSIS — E440 Moderate protein-calorie malnutrition: Secondary | ICD-10-CM | POA: Diagnosis not present

## 2018-09-16 DIAGNOSIS — N186 End stage renal disease: Secondary | ICD-10-CM | POA: Diagnosis not present

## 2018-09-16 DIAGNOSIS — D509 Iron deficiency anemia, unspecified: Secondary | ICD-10-CM | POA: Diagnosis not present

## 2018-09-16 DIAGNOSIS — Z4932 Encounter for adequacy testing for peritoneal dialysis: Secondary | ICD-10-CM | POA: Diagnosis not present

## 2018-09-16 DIAGNOSIS — N2581 Secondary hyperparathyroidism of renal origin: Secondary | ICD-10-CM | POA: Diagnosis not present

## 2018-09-17 DIAGNOSIS — D509 Iron deficiency anemia, unspecified: Secondary | ICD-10-CM | POA: Diagnosis not present

## 2018-09-17 DIAGNOSIS — Z4932 Encounter for adequacy testing for peritoneal dialysis: Secondary | ICD-10-CM | POA: Diagnosis not present

## 2018-09-17 DIAGNOSIS — N186 End stage renal disease: Secondary | ICD-10-CM | POA: Diagnosis not present

## 2018-09-17 DIAGNOSIS — N2581 Secondary hyperparathyroidism of renal origin: Secondary | ICD-10-CM | POA: Diagnosis not present

## 2018-09-17 DIAGNOSIS — D631 Anemia in chronic kidney disease: Secondary | ICD-10-CM | POA: Diagnosis not present

## 2018-09-17 DIAGNOSIS — E440 Moderate protein-calorie malnutrition: Secondary | ICD-10-CM | POA: Diagnosis not present

## 2018-09-18 DIAGNOSIS — E440 Moderate protein-calorie malnutrition: Secondary | ICD-10-CM | POA: Diagnosis not present

## 2018-09-18 DIAGNOSIS — N2581 Secondary hyperparathyroidism of renal origin: Secondary | ICD-10-CM | POA: Diagnosis not present

## 2018-09-18 DIAGNOSIS — Z4932 Encounter for adequacy testing for peritoneal dialysis: Secondary | ICD-10-CM | POA: Diagnosis not present

## 2018-09-18 DIAGNOSIS — D509 Iron deficiency anemia, unspecified: Secondary | ICD-10-CM | POA: Diagnosis not present

## 2018-09-18 DIAGNOSIS — N186 End stage renal disease: Secondary | ICD-10-CM | POA: Diagnosis not present

## 2018-09-18 DIAGNOSIS — D631 Anemia in chronic kidney disease: Secondary | ICD-10-CM | POA: Diagnosis not present

## 2018-09-19 DIAGNOSIS — D509 Iron deficiency anemia, unspecified: Secondary | ICD-10-CM | POA: Diagnosis not present

## 2018-09-19 DIAGNOSIS — N186 End stage renal disease: Secondary | ICD-10-CM | POA: Diagnosis not present

## 2018-09-19 DIAGNOSIS — H5203 Hypermetropia, bilateral: Secondary | ICD-10-CM | POA: Diagnosis not present

## 2018-09-19 DIAGNOSIS — E440 Moderate protein-calorie malnutrition: Secondary | ICD-10-CM | POA: Diagnosis not present

## 2018-09-19 DIAGNOSIS — Z4932 Encounter for adequacy testing for peritoneal dialysis: Secondary | ICD-10-CM | POA: Diagnosis not present

## 2018-09-19 DIAGNOSIS — N2581 Secondary hyperparathyroidism of renal origin: Secondary | ICD-10-CM | POA: Diagnosis not present

## 2018-09-19 DIAGNOSIS — D631 Anemia in chronic kidney disease: Secondary | ICD-10-CM | POA: Diagnosis not present

## 2018-09-19 DIAGNOSIS — E119 Type 2 diabetes mellitus without complications: Secondary | ICD-10-CM | POA: Diagnosis not present

## 2018-09-19 DIAGNOSIS — H524 Presbyopia: Secondary | ICD-10-CM | POA: Diagnosis not present

## 2018-09-20 DIAGNOSIS — Z4932 Encounter for adequacy testing for peritoneal dialysis: Secondary | ICD-10-CM | POA: Diagnosis not present

## 2018-09-20 DIAGNOSIS — D631 Anemia in chronic kidney disease: Secondary | ICD-10-CM | POA: Diagnosis not present

## 2018-09-20 DIAGNOSIS — N2581 Secondary hyperparathyroidism of renal origin: Secondary | ICD-10-CM | POA: Diagnosis not present

## 2018-09-20 DIAGNOSIS — E440 Moderate protein-calorie malnutrition: Secondary | ICD-10-CM | POA: Diagnosis not present

## 2018-09-20 DIAGNOSIS — D509 Iron deficiency anemia, unspecified: Secondary | ICD-10-CM | POA: Diagnosis not present

## 2018-09-20 DIAGNOSIS — N186 End stage renal disease: Secondary | ICD-10-CM | POA: Diagnosis not present

## 2018-09-21 DIAGNOSIS — N186 End stage renal disease: Secondary | ICD-10-CM | POA: Diagnosis not present

## 2018-09-21 DIAGNOSIS — D631 Anemia in chronic kidney disease: Secondary | ICD-10-CM | POA: Diagnosis not present

## 2018-09-21 DIAGNOSIS — Z4932 Encounter for adequacy testing for peritoneal dialysis: Secondary | ICD-10-CM | POA: Diagnosis not present

## 2018-09-21 DIAGNOSIS — Z794 Long term (current) use of insulin: Secondary | ICD-10-CM | POA: Diagnosis not present

## 2018-09-21 DIAGNOSIS — Z0000 Encounter for general adult medical examination without abnormal findings: Secondary | ICD-10-CM | POA: Diagnosis not present

## 2018-09-21 DIAGNOSIS — E1122 Type 2 diabetes mellitus with diabetic chronic kidney disease: Secondary | ICD-10-CM | POA: Diagnosis not present

## 2018-09-21 DIAGNOSIS — E782 Mixed hyperlipidemia: Secondary | ICD-10-CM | POA: Diagnosis not present

## 2018-09-21 DIAGNOSIS — R17 Unspecified jaundice: Secondary | ICD-10-CM | POA: Diagnosis not present

## 2018-09-21 DIAGNOSIS — I129 Hypertensive chronic kidney disease with stage 1 through stage 4 chronic kidney disease, or unspecified chronic kidney disease: Secondary | ICD-10-CM | POA: Diagnosis not present

## 2018-09-21 DIAGNOSIS — E440 Moderate protein-calorie malnutrition: Secondary | ICD-10-CM | POA: Diagnosis not present

## 2018-09-21 DIAGNOSIS — N2581 Secondary hyperparathyroidism of renal origin: Secondary | ICD-10-CM | POA: Diagnosis not present

## 2018-09-21 DIAGNOSIS — K219 Gastro-esophageal reflux disease without esophagitis: Secondary | ICD-10-CM | POA: Diagnosis not present

## 2018-09-21 DIAGNOSIS — Z1389 Encounter for screening for other disorder: Secondary | ICD-10-CM | POA: Diagnosis not present

## 2018-09-21 DIAGNOSIS — Z79899 Other long term (current) drug therapy: Secondary | ICD-10-CM | POA: Diagnosis not present

## 2018-09-21 DIAGNOSIS — D509 Iron deficiency anemia, unspecified: Secondary | ICD-10-CM | POA: Diagnosis not present

## 2018-09-21 DIAGNOSIS — M519 Unspecified thoracic, thoracolumbar and lumbosacral intervertebral disc disorder: Secondary | ICD-10-CM | POA: Diagnosis not present

## 2018-09-21 DIAGNOSIS — R51 Headache: Secondary | ICD-10-CM | POA: Diagnosis not present

## 2018-09-21 DIAGNOSIS — Z992 Dependence on renal dialysis: Secondary | ICD-10-CM | POA: Diagnosis not present

## 2018-09-21 DIAGNOSIS — I10 Essential (primary) hypertension: Secondary | ICD-10-CM | POA: Diagnosis not present

## 2018-09-21 DIAGNOSIS — Z1239 Encounter for other screening for malignant neoplasm of breast: Secondary | ICD-10-CM | POA: Diagnosis not present

## 2018-09-21 DIAGNOSIS — B182 Chronic viral hepatitis C: Secondary | ICD-10-CM | POA: Diagnosis not present

## 2018-09-21 DIAGNOSIS — F322 Major depressive disorder, single episode, severe without psychotic features: Secondary | ICD-10-CM | POA: Diagnosis not present

## 2018-09-22 ENCOUNTER — Other Ambulatory Visit: Payer: Self-pay | Admitting: Internal Medicine

## 2018-09-22 DIAGNOSIS — B182 Chronic viral hepatitis C: Secondary | ICD-10-CM | POA: Diagnosis not present

## 2018-09-22 DIAGNOSIS — E1122 Type 2 diabetes mellitus with diabetic chronic kidney disease: Secondary | ICD-10-CM | POA: Diagnosis not present

## 2018-09-22 DIAGNOSIS — Z794 Long term (current) use of insulin: Secondary | ICD-10-CM | POA: Diagnosis not present

## 2018-09-22 DIAGNOSIS — N186 End stage renal disease: Secondary | ICD-10-CM | POA: Diagnosis not present

## 2018-09-22 DIAGNOSIS — E782 Mixed hyperlipidemia: Secondary | ICD-10-CM | POA: Diagnosis not present

## 2018-09-22 DIAGNOSIS — Z1231 Encounter for screening mammogram for malignant neoplasm of breast: Secondary | ICD-10-CM

## 2018-09-22 DIAGNOSIS — F322 Major depressive disorder, single episode, severe without psychotic features: Secondary | ICD-10-CM | POA: Diagnosis not present

## 2018-09-22 DIAGNOSIS — Z79899 Other long term (current) drug therapy: Secondary | ICD-10-CM | POA: Diagnosis not present

## 2018-09-22 DIAGNOSIS — Z0000 Encounter for general adult medical examination without abnormal findings: Secondary | ICD-10-CM | POA: Diagnosis not present

## 2018-09-22 DIAGNOSIS — D509 Iron deficiency anemia, unspecified: Secondary | ICD-10-CM | POA: Diagnosis not present

## 2018-09-22 DIAGNOSIS — M519 Unspecified thoracic, thoracolumbar and lumbosacral intervertebral disc disorder: Secondary | ICD-10-CM | POA: Diagnosis not present

## 2018-09-22 DIAGNOSIS — Z992 Dependence on renal dialysis: Secondary | ICD-10-CM | POA: Diagnosis not present

## 2018-09-22 DIAGNOSIS — R51 Headache: Secondary | ICD-10-CM | POA: Diagnosis not present

## 2018-09-22 DIAGNOSIS — Z1389 Encounter for screening for other disorder: Secondary | ICD-10-CM | POA: Diagnosis not present

## 2018-09-22 DIAGNOSIS — E440 Moderate protein-calorie malnutrition: Secondary | ICD-10-CM | POA: Diagnosis not present

## 2018-09-22 DIAGNOSIS — D631 Anemia in chronic kidney disease: Secondary | ICD-10-CM | POA: Diagnosis not present

## 2018-09-22 DIAGNOSIS — K219 Gastro-esophageal reflux disease without esophagitis: Secondary | ICD-10-CM | POA: Diagnosis not present

## 2018-09-22 DIAGNOSIS — I10 Essential (primary) hypertension: Secondary | ICD-10-CM | POA: Diagnosis not present

## 2018-09-22 DIAGNOSIS — Z1239 Encounter for other screening for malignant neoplasm of breast: Secondary | ICD-10-CM | POA: Diagnosis not present

## 2018-09-22 DIAGNOSIS — N2581 Secondary hyperparathyroidism of renal origin: Secondary | ICD-10-CM | POA: Diagnosis not present

## 2018-09-23 ENCOUNTER — Ambulatory Visit
Admission: RE | Admit: 2018-09-23 | Discharge: 2018-09-23 | Disposition: A | Discharge status: Home / Self Care | Payer: Medicare Other | Source: Ambulatory Visit | Attending: Internal Medicine | Admitting: Internal Medicine

## 2018-09-23 DIAGNOSIS — D509 Iron deficiency anemia, unspecified: Secondary | ICD-10-CM | POA: Diagnosis not present

## 2018-09-23 DIAGNOSIS — Z1231 Encounter for screening mammogram for malignant neoplasm of breast: Secondary | ICD-10-CM

## 2018-09-23 DIAGNOSIS — N2581 Secondary hyperparathyroidism of renal origin: Secondary | ICD-10-CM | POA: Diagnosis not present

## 2018-09-23 DIAGNOSIS — E440 Moderate protein-calorie malnutrition: Secondary | ICD-10-CM | POA: Diagnosis not present

## 2018-09-23 DIAGNOSIS — Z79899 Other long term (current) drug therapy: Secondary | ICD-10-CM | POA: Diagnosis not present

## 2018-09-23 DIAGNOSIS — N186 End stage renal disease: Secondary | ICD-10-CM | POA: Diagnosis not present

## 2018-09-23 DIAGNOSIS — D631 Anemia in chronic kidney disease: Secondary | ICD-10-CM | POA: Diagnosis not present

## 2018-09-24 DIAGNOSIS — Z79899 Other long term (current) drug therapy: Secondary | ICD-10-CM | POA: Diagnosis not present

## 2018-09-24 DIAGNOSIS — R82994 Hypercalciuria: Secondary | ICD-10-CM | POA: Diagnosis not present

## 2018-09-24 DIAGNOSIS — N186 End stage renal disease: Secondary | ICD-10-CM | POA: Diagnosis not present

## 2018-09-24 DIAGNOSIS — D509 Iron deficiency anemia, unspecified: Secondary | ICD-10-CM | POA: Diagnosis not present

## 2018-09-24 DIAGNOSIS — E440 Moderate protein-calorie malnutrition: Secondary | ICD-10-CM | POA: Diagnosis not present

## 2018-09-24 DIAGNOSIS — N2581 Secondary hyperparathyroidism of renal origin: Secondary | ICD-10-CM | POA: Diagnosis not present

## 2018-09-24 DIAGNOSIS — D631 Anemia in chronic kidney disease: Secondary | ICD-10-CM | POA: Diagnosis not present

## 2018-09-25 DIAGNOSIS — N186 End stage renal disease: Secondary | ICD-10-CM | POA: Diagnosis not present

## 2018-09-25 DIAGNOSIS — Z79899 Other long term (current) drug therapy: Secondary | ICD-10-CM | POA: Diagnosis not present

## 2018-09-25 DIAGNOSIS — D509 Iron deficiency anemia, unspecified: Secondary | ICD-10-CM | POA: Diagnosis not present

## 2018-09-25 DIAGNOSIS — E440 Moderate protein-calorie malnutrition: Secondary | ICD-10-CM | POA: Diagnosis not present

## 2018-09-25 DIAGNOSIS — N2581 Secondary hyperparathyroidism of renal origin: Secondary | ICD-10-CM | POA: Diagnosis not present

## 2018-09-25 DIAGNOSIS — D631 Anemia in chronic kidney disease: Secondary | ICD-10-CM | POA: Diagnosis not present

## 2018-09-26 ENCOUNTER — Encounter: Payer: Self-pay | Admitting: Dietician

## 2018-09-26 ENCOUNTER — Encounter: Payer: Medicare Other | Attending: Family | Admitting: Dietician

## 2018-09-26 ENCOUNTER — Other Ambulatory Visit: Payer: Self-pay

## 2018-09-26 DIAGNOSIS — E119 Type 2 diabetes mellitus without complications: Secondary | ICD-10-CM | POA: Diagnosis not present

## 2018-09-26 DIAGNOSIS — E440 Moderate protein-calorie malnutrition: Secondary | ICD-10-CM | POA: Diagnosis not present

## 2018-09-26 DIAGNOSIS — Z79899 Other long term (current) drug therapy: Secondary | ICD-10-CM | POA: Diagnosis not present

## 2018-09-26 DIAGNOSIS — D631 Anemia in chronic kidney disease: Secondary | ICD-10-CM | POA: Diagnosis not present

## 2018-09-26 DIAGNOSIS — Z794 Long term (current) use of insulin: Secondary | ICD-10-CM | POA: Diagnosis not present

## 2018-09-26 DIAGNOSIS — N186 End stage renal disease: Secondary | ICD-10-CM | POA: Diagnosis not present

## 2018-09-26 DIAGNOSIS — N183 Chronic kidney disease, stage 3 unspecified: Secondary | ICD-10-CM

## 2018-09-26 DIAGNOSIS — D509 Iron deficiency anemia, unspecified: Secondary | ICD-10-CM | POA: Diagnosis not present

## 2018-09-26 DIAGNOSIS — N2581 Secondary hyperparathyroidism of renal origin: Secondary | ICD-10-CM | POA: Diagnosis not present

## 2018-09-26 NOTE — Patient Instructions (Addendum)
Avoid added salt. Bake rather than fry Small amounts of olive oil are fine Choose margarine with less saturated fat. Choose lean meat and remove the skin from the chicken Make recipe substitutions (instead of regular cream cheese use fat free cream cheese, etc)  Resources: Kidney.org [employer]  Bar supplements for dialysis patients per [employer] Energy and protein bars are widely available these days, and although most are not specifically made for the kidney diet, many bars are low enough in sodium, potassium and phosphorus to be included. It's important to check the nutrition facts on each bar, because brands have a mix of acceptable bars and those that are not kidney-friendly.Use these guidelines when choosing a protein bar that's good for you, or ask your dietitian for help: Protein: 15 g or more  Potassium: 200 mg or less  Phosphorus: 150 mg or less  Sodium: 300 mg or less   Nu Go, Balance Bars, Pure Protein, Zone Perfect Bars all meet these criteria.

## 2018-09-26 NOTE — Progress Notes (Signed)
Medical Nutrition Therapy:  Appt start time: 1310 end time:  1430.   Assessment:  Primary concerns today:  Patient referred for type 2 diabetes, HTN, and CKD.  She is here today alone.  She would like to know what foods to eat to lower cholesterol and manage her ESRD and DM.  She is on peritoneal dialysis each night.  Other history includes headaches. Sleep:  Wakes in the middle of the night for 2 hours.  Sleeps only 4-5 hours at night.  Labs noted:  09/08/2018:  Cholesterol 317, HDL 45, LDL 234, Triglycerides 204, A1C 6.3%. BG 97 am and 105-159 after meals She states that she has had a low BG twice in the past 2 weeks and treats with OJ and PB and crackers  Medications include:  Lantus 10 units q am, phos binders, Farxiga, Metformin 177 lbs today and general 161 lbs-164 lbs and has increased recently as she is eating more to try to help her medication not hurt her stomach.  Patient's son lives with her.  She has not been feeling well (nausea, headache, very poor energy) and is thinking about having her daughter help shop but is currently doing the cooking and shopping.  She is trying to follow a low sugar, low salt, low phosphorous diet.  She is on disability.  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 Enjoys reading, designing and sewing clothes and jewelry, crafts and decorating. She follows a low sodium, low phosphorous diet with a 32 ounce Fluid Restriction.  Preferred Learning Style:   No preference indicated   Learning Readiness:   Ready  Change in progress   DIETARY INTAKE:  24-hr recall:  B ( AM): cream of wheat or oatmeal, boiled eggs, tea, white toast OR scrambled eggs, broccoli, plant based sausage, hash brown potatoes  Snk ( AM): occasional yogurt, granola, berries L ( PM): tuna sandwich on white, fruit  Snk ( PM):  D ( PM): grilled salmon or chicken, rice, green beans or broccoli OR pasta, meat sauce with ground [place], salad   Snk ( PM): cookie  occasionally Beverages: hot tea, lemon and honey or non dairy creamer, splenda  Usual physical activity: ADL's currently, enjoys walking (mall or park)  Estimated energy needs: 1800-2000 calories 80-90 g protein  Progress Towards Goal(s):  In progress.   Nutritional Diagnosis:  NB-1.1 Food and nutrition-related knowledge deficit As related to diet for ESRD and CKD.  As evidenced by diet hx and patient report.    Intervention:  Nutrition counseling/education related to diet for diabetes and CKD with peritoneal dialysis.  Showed patient the [employer] site and ideas for recipes and menu planning.  Discussed protein bar options that she could use when she was not able to eat otherwise or to supplement her meal intake when she was not able to eat enough.  She has internet access at home.  Avoid added salt. Bake rather than fry Small amounts of olive oil are fine Choose margarine with less saturated fat. Choose lean meat and remove the skin from the chicken Make recipe substitutions (instead of regular cream cheese use fat free cream cheese, etc)  Resources: Kidney.[facility] [employer]  Bar supplements for dialysis patients per [employer] Energy and protein bars are widely available these days, and although most are not specifically made for the kidney diet, many bars are low enough in sodium, potassium and phosphorus to be included. It's important to check the nutrition facts on each bar, because brands have  a mix of acceptable bars and those that are not kidney-friendly.Use these guidelines when choosing a protein bar that's good for you, or ask your dietitian for help: Protein: 15 g or more  Potassium: 200 mg or less  Phosphorus: 150 mg or less  Sodium: 300 mg or less   Nu Go, Balance Bars, Pure Protein, Zone Perfect Bars all meet these criteria.  Teaching Method Utilized:  Visual Auditory Hands on  Handouts given during visit include:  Kidney pyramid  Meal plan card  My  plate  Barriers to learning/adherence to lifestyle change: current health  Demonstrated degree of understanding via:  Teach Back   Monitoring/Evaluation:  Dietary intake, exercise, and body weight prn.

## 2018-09-27 DIAGNOSIS — N2581 Secondary hyperparathyroidism of renal origin: Secondary | ICD-10-CM | POA: Diagnosis not present

## 2018-09-27 DIAGNOSIS — E440 Moderate protein-calorie malnutrition: Secondary | ICD-10-CM | POA: Diagnosis not present

## 2018-09-27 DIAGNOSIS — N186 End stage renal disease: Secondary | ICD-10-CM | POA: Diagnosis not present

## 2018-09-27 DIAGNOSIS — D509 Iron deficiency anemia, unspecified: Secondary | ICD-10-CM | POA: Diagnosis not present

## 2018-09-27 DIAGNOSIS — D631 Anemia in chronic kidney disease: Secondary | ICD-10-CM | POA: Diagnosis not present

## 2018-09-27 DIAGNOSIS — Z79899 Other long term (current) drug therapy: Secondary | ICD-10-CM | POA: Diagnosis not present

## 2018-09-28 DIAGNOSIS — N186 End stage renal disease: Secondary | ICD-10-CM | POA: Diagnosis not present

## 2018-09-28 DIAGNOSIS — E440 Moderate protein-calorie malnutrition: Secondary | ICD-10-CM | POA: Diagnosis not present

## 2018-09-28 DIAGNOSIS — Z79899 Other long term (current) drug therapy: Secondary | ICD-10-CM | POA: Diagnosis not present

## 2018-09-28 DIAGNOSIS — D631 Anemia in chronic kidney disease: Secondary | ICD-10-CM | POA: Diagnosis not present

## 2018-09-28 DIAGNOSIS — N2581 Secondary hyperparathyroidism of renal origin: Secondary | ICD-10-CM | POA: Diagnosis not present

## 2018-09-28 DIAGNOSIS — D509 Iron deficiency anemia, unspecified: Secondary | ICD-10-CM | POA: Diagnosis not present

## 2018-09-29 DIAGNOSIS — Z79899 Other long term (current) drug therapy: Secondary | ICD-10-CM | POA: Diagnosis not present

## 2018-09-29 DIAGNOSIS — N2581 Secondary hyperparathyroidism of renal origin: Secondary | ICD-10-CM | POA: Diagnosis not present

## 2018-09-29 DIAGNOSIS — D509 Iron deficiency anemia, unspecified: Secondary | ICD-10-CM | POA: Diagnosis not present

## 2018-09-29 DIAGNOSIS — E440 Moderate protein-calorie malnutrition: Secondary | ICD-10-CM | POA: Diagnosis not present

## 2018-09-29 DIAGNOSIS — N186 End stage renal disease: Secondary | ICD-10-CM | POA: Diagnosis not present

## 2018-09-29 DIAGNOSIS — D631 Anemia in chronic kidney disease: Secondary | ICD-10-CM | POA: Diagnosis not present

## 2018-09-30 DIAGNOSIS — Z79899 Other long term (current) drug therapy: Secondary | ICD-10-CM | POA: Diagnosis not present

## 2018-09-30 DIAGNOSIS — N2581 Secondary hyperparathyroidism of renal origin: Secondary | ICD-10-CM | POA: Diagnosis not present

## 2018-09-30 DIAGNOSIS — N186 End stage renal disease: Secondary | ICD-10-CM | POA: Diagnosis not present

## 2018-09-30 DIAGNOSIS — D509 Iron deficiency anemia, unspecified: Secondary | ICD-10-CM | POA: Diagnosis not present

## 2018-09-30 DIAGNOSIS — D631 Anemia in chronic kidney disease: Secondary | ICD-10-CM | POA: Diagnosis not present

## 2018-09-30 DIAGNOSIS — E440 Moderate protein-calorie malnutrition: Secondary | ICD-10-CM | POA: Diagnosis not present

## 2018-10-01 DIAGNOSIS — Z79899 Other long term (current) drug therapy: Secondary | ICD-10-CM | POA: Diagnosis not present

## 2018-10-01 DIAGNOSIS — D631 Anemia in chronic kidney disease: Secondary | ICD-10-CM | POA: Diagnosis not present

## 2018-10-01 DIAGNOSIS — D509 Iron deficiency anemia, unspecified: Secondary | ICD-10-CM | POA: Diagnosis not present

## 2018-10-01 DIAGNOSIS — E440 Moderate protein-calorie malnutrition: Secondary | ICD-10-CM | POA: Diagnosis not present

## 2018-10-01 DIAGNOSIS — N2581 Secondary hyperparathyroidism of renal origin: Secondary | ICD-10-CM | POA: Diagnosis not present

## 2018-10-01 DIAGNOSIS — N186 End stage renal disease: Secondary | ICD-10-CM | POA: Diagnosis not present

## 2018-10-02 DIAGNOSIS — G43839 Menstrual migraine, intractable, without status migrainosus: Secondary | ICD-10-CM | POA: Diagnosis not present

## 2018-10-02 DIAGNOSIS — D631 Anemia in chronic kidney disease: Secondary | ICD-10-CM | POA: Diagnosis not present

## 2018-10-02 DIAGNOSIS — Z79899 Other long term (current) drug therapy: Secondary | ICD-10-CM | POA: Diagnosis not present

## 2018-10-02 DIAGNOSIS — G43719 Chronic migraine without aura, intractable, without status migrainosus: Secondary | ICD-10-CM | POA: Diagnosis not present

## 2018-10-02 DIAGNOSIS — E440 Moderate protein-calorie malnutrition: Secondary | ICD-10-CM | POA: Diagnosis not present

## 2018-10-02 DIAGNOSIS — Z049 Encounter for examination and observation for unspecified reason: Secondary | ICD-10-CM | POA: Diagnosis not present

## 2018-10-02 DIAGNOSIS — N2581 Secondary hyperparathyroidism of renal origin: Secondary | ICD-10-CM | POA: Diagnosis not present

## 2018-10-02 DIAGNOSIS — D509 Iron deficiency anemia, unspecified: Secondary | ICD-10-CM | POA: Diagnosis not present

## 2018-10-02 DIAGNOSIS — N186 End stage renal disease: Secondary | ICD-10-CM | POA: Diagnosis not present

## 2018-10-03 DIAGNOSIS — D631 Anemia in chronic kidney disease: Secondary | ICD-10-CM | POA: Diagnosis not present

## 2018-10-03 DIAGNOSIS — N2581 Secondary hyperparathyroidism of renal origin: Secondary | ICD-10-CM | POA: Diagnosis not present

## 2018-10-03 DIAGNOSIS — N186 End stage renal disease: Secondary | ICD-10-CM | POA: Diagnosis not present

## 2018-10-03 DIAGNOSIS — Z79899 Other long term (current) drug therapy: Secondary | ICD-10-CM | POA: Diagnosis not present

## 2018-10-03 DIAGNOSIS — D509 Iron deficiency anemia, unspecified: Secondary | ICD-10-CM | POA: Diagnosis not present

## 2018-10-03 DIAGNOSIS — E440 Moderate protein-calorie malnutrition: Secondary | ICD-10-CM | POA: Diagnosis not present

## 2018-10-04 DIAGNOSIS — N2581 Secondary hyperparathyroidism of renal origin: Secondary | ICD-10-CM | POA: Diagnosis not present

## 2018-10-04 DIAGNOSIS — N186 End stage renal disease: Secondary | ICD-10-CM | POA: Diagnosis not present

## 2018-10-04 DIAGNOSIS — D509 Iron deficiency anemia, unspecified: Secondary | ICD-10-CM | POA: Diagnosis not present

## 2018-10-04 DIAGNOSIS — Z79899 Other long term (current) drug therapy: Secondary | ICD-10-CM | POA: Diagnosis not present

## 2018-10-04 DIAGNOSIS — E440 Moderate protein-calorie malnutrition: Secondary | ICD-10-CM | POA: Diagnosis not present

## 2018-10-04 DIAGNOSIS — D631 Anemia in chronic kidney disease: Secondary | ICD-10-CM | POA: Diagnosis not present

## 2018-10-05 DIAGNOSIS — E440 Moderate protein-calorie malnutrition: Secondary | ICD-10-CM | POA: Diagnosis not present

## 2018-10-05 DIAGNOSIS — D509 Iron deficiency anemia, unspecified: Secondary | ICD-10-CM | POA: Diagnosis not present

## 2018-10-05 DIAGNOSIS — Z79899 Other long term (current) drug therapy: Secondary | ICD-10-CM | POA: Diagnosis not present

## 2018-10-05 DIAGNOSIS — D631 Anemia in chronic kidney disease: Secondary | ICD-10-CM | POA: Diagnosis not present

## 2018-10-05 DIAGNOSIS — N186 End stage renal disease: Secondary | ICD-10-CM | POA: Diagnosis not present

## 2018-10-05 DIAGNOSIS — N2581 Secondary hyperparathyroidism of renal origin: Secondary | ICD-10-CM | POA: Diagnosis not present

## 2018-10-06 DIAGNOSIS — D631 Anemia in chronic kidney disease: Secondary | ICD-10-CM | POA: Diagnosis not present

## 2018-10-06 DIAGNOSIS — N2581 Secondary hyperparathyroidism of renal origin: Secondary | ICD-10-CM | POA: Diagnosis not present

## 2018-10-06 DIAGNOSIS — E440 Moderate protein-calorie malnutrition: Secondary | ICD-10-CM | POA: Diagnosis not present

## 2018-10-06 DIAGNOSIS — Z79899 Other long term (current) drug therapy: Secondary | ICD-10-CM | POA: Diagnosis not present

## 2018-10-06 DIAGNOSIS — D509 Iron deficiency anemia, unspecified: Secondary | ICD-10-CM | POA: Diagnosis not present

## 2018-10-06 DIAGNOSIS — N186 End stage renal disease: Secondary | ICD-10-CM | POA: Diagnosis not present

## 2018-10-07 DIAGNOSIS — D631 Anemia in chronic kidney disease: Secondary | ICD-10-CM | POA: Diagnosis not present

## 2018-10-07 DIAGNOSIS — Z79899 Other long term (current) drug therapy: Secondary | ICD-10-CM | POA: Diagnosis not present

## 2018-10-07 DIAGNOSIS — N186 End stage renal disease: Secondary | ICD-10-CM | POA: Diagnosis not present

## 2018-10-07 DIAGNOSIS — E440 Moderate protein-calorie malnutrition: Secondary | ICD-10-CM | POA: Diagnosis not present

## 2018-10-07 DIAGNOSIS — N2581 Secondary hyperparathyroidism of renal origin: Secondary | ICD-10-CM | POA: Diagnosis not present

## 2018-10-07 DIAGNOSIS — D509 Iron deficiency anemia, unspecified: Secondary | ICD-10-CM | POA: Diagnosis not present

## 2018-10-08 DIAGNOSIS — D631 Anemia in chronic kidney disease: Secondary | ICD-10-CM | POA: Diagnosis not present

## 2018-10-08 DIAGNOSIS — N186 End stage renal disease: Secondary | ICD-10-CM | POA: Diagnosis not present

## 2018-10-08 DIAGNOSIS — N2581 Secondary hyperparathyroidism of renal origin: Secondary | ICD-10-CM | POA: Diagnosis not present

## 2018-10-08 DIAGNOSIS — Z79899 Other long term (current) drug therapy: Secondary | ICD-10-CM | POA: Diagnosis not present

## 2018-10-08 DIAGNOSIS — D509 Iron deficiency anemia, unspecified: Secondary | ICD-10-CM | POA: Diagnosis not present

## 2018-10-08 DIAGNOSIS — E440 Moderate protein-calorie malnutrition: Secondary | ICD-10-CM | POA: Diagnosis not present

## 2018-10-09 DIAGNOSIS — D631 Anemia in chronic kidney disease: Secondary | ICD-10-CM | POA: Diagnosis not present

## 2018-10-09 DIAGNOSIS — D509 Iron deficiency anemia, unspecified: Secondary | ICD-10-CM | POA: Diagnosis not present

## 2018-10-09 DIAGNOSIS — Z79899 Other long term (current) drug therapy: Secondary | ICD-10-CM | POA: Diagnosis not present

## 2018-10-09 DIAGNOSIS — N186 End stage renal disease: Secondary | ICD-10-CM | POA: Diagnosis not present

## 2018-10-09 DIAGNOSIS — N2581 Secondary hyperparathyroidism of renal origin: Secondary | ICD-10-CM | POA: Diagnosis not present

## 2018-10-09 DIAGNOSIS — E440 Moderate protein-calorie malnutrition: Secondary | ICD-10-CM | POA: Diagnosis not present

## 2018-10-10 DIAGNOSIS — D509 Iron deficiency anemia, unspecified: Secondary | ICD-10-CM | POA: Diagnosis not present

## 2018-10-10 DIAGNOSIS — N186 End stage renal disease: Secondary | ICD-10-CM | POA: Diagnosis not present

## 2018-10-10 DIAGNOSIS — D631 Anemia in chronic kidney disease: Secondary | ICD-10-CM | POA: Diagnosis not present

## 2018-10-10 DIAGNOSIS — N2581 Secondary hyperparathyroidism of renal origin: Secondary | ICD-10-CM | POA: Diagnosis not present

## 2018-10-10 DIAGNOSIS — Z79899 Other long term (current) drug therapy: Secondary | ICD-10-CM | POA: Diagnosis not present

## 2018-10-10 DIAGNOSIS — E440 Moderate protein-calorie malnutrition: Secondary | ICD-10-CM | POA: Diagnosis not present

## 2018-10-11 DIAGNOSIS — D509 Iron deficiency anemia, unspecified: Secondary | ICD-10-CM | POA: Diagnosis not present

## 2018-10-11 DIAGNOSIS — N2581 Secondary hyperparathyroidism of renal origin: Secondary | ICD-10-CM | POA: Diagnosis not present

## 2018-10-11 DIAGNOSIS — E440 Moderate protein-calorie malnutrition: Secondary | ICD-10-CM | POA: Diagnosis not present

## 2018-10-11 DIAGNOSIS — Z79899 Other long term (current) drug therapy: Secondary | ICD-10-CM | POA: Diagnosis not present

## 2018-10-11 DIAGNOSIS — D631 Anemia in chronic kidney disease: Secondary | ICD-10-CM | POA: Diagnosis not present

## 2018-10-11 DIAGNOSIS — N186 End stage renal disease: Secondary | ICD-10-CM | POA: Diagnosis not present

## 2018-10-12 DIAGNOSIS — D631 Anemia in chronic kidney disease: Secondary | ICD-10-CM | POA: Diagnosis not present

## 2018-10-12 DIAGNOSIS — Z79899 Other long term (current) drug therapy: Secondary | ICD-10-CM | POA: Diagnosis not present

## 2018-10-12 DIAGNOSIS — D509 Iron deficiency anemia, unspecified: Secondary | ICD-10-CM | POA: Diagnosis not present

## 2018-10-12 DIAGNOSIS — N186 End stage renal disease: Secondary | ICD-10-CM | POA: Diagnosis not present

## 2018-10-12 DIAGNOSIS — E440 Moderate protein-calorie malnutrition: Secondary | ICD-10-CM | POA: Diagnosis not present

## 2018-10-12 DIAGNOSIS — N2581 Secondary hyperparathyroidism of renal origin: Secondary | ICD-10-CM | POA: Diagnosis not present

## 2018-10-13 DIAGNOSIS — N2581 Secondary hyperparathyroidism of renal origin: Secondary | ICD-10-CM | POA: Diagnosis not present

## 2018-10-13 DIAGNOSIS — D509 Iron deficiency anemia, unspecified: Secondary | ICD-10-CM | POA: Diagnosis not present

## 2018-10-13 DIAGNOSIS — D631 Anemia in chronic kidney disease: Secondary | ICD-10-CM | POA: Diagnosis not present

## 2018-10-13 DIAGNOSIS — N186 End stage renal disease: Secondary | ICD-10-CM | POA: Diagnosis not present

## 2018-10-13 DIAGNOSIS — E440 Moderate protein-calorie malnutrition: Secondary | ICD-10-CM | POA: Diagnosis not present

## 2018-10-13 DIAGNOSIS — Z79899 Other long term (current) drug therapy: Secondary | ICD-10-CM | POA: Diagnosis not present

## 2018-10-14 DIAGNOSIS — D631 Anemia in chronic kidney disease: Secondary | ICD-10-CM | POA: Diagnosis not present

## 2018-10-14 DIAGNOSIS — N186 End stage renal disease: Secondary | ICD-10-CM | POA: Diagnosis not present

## 2018-10-14 DIAGNOSIS — Z79899 Other long term (current) drug therapy: Secondary | ICD-10-CM | POA: Diagnosis not present

## 2018-10-14 DIAGNOSIS — E440 Moderate protein-calorie malnutrition: Secondary | ICD-10-CM | POA: Diagnosis not present

## 2018-10-14 DIAGNOSIS — N2581 Secondary hyperparathyroidism of renal origin: Secondary | ICD-10-CM | POA: Diagnosis not present

## 2018-10-14 DIAGNOSIS — D509 Iron deficiency anemia, unspecified: Secondary | ICD-10-CM | POA: Diagnosis not present

## 2018-10-15 ENCOUNTER — Other Ambulatory Visit: Payer: Self-pay

## 2018-10-15 ENCOUNTER — Telehealth (INDEPENDENT_AMBULATORY_CARE_PROVIDER_SITE_OTHER): Payer: Medicare Other | Admitting: Gastroenterology

## 2018-10-15 ENCOUNTER — Telehealth: Payer: Medicare Other | Admitting: Gastroenterology

## 2018-10-15 DIAGNOSIS — R1013 Epigastric pain: Secondary | ICD-10-CM | POA: Diagnosis not present

## 2018-10-15 DIAGNOSIS — N186 End stage renal disease: Secondary | ICD-10-CM | POA: Diagnosis not present

## 2018-10-15 DIAGNOSIS — R112 Nausea with vomiting, unspecified: Secondary | ICD-10-CM | POA: Diagnosis not present

## 2018-10-15 DIAGNOSIS — D631 Anemia in chronic kidney disease: Secondary | ICD-10-CM | POA: Diagnosis not present

## 2018-10-15 DIAGNOSIS — D509 Iron deficiency anemia, unspecified: Secondary | ICD-10-CM | POA: Diagnosis not present

## 2018-10-15 DIAGNOSIS — Z79899 Other long term (current) drug therapy: Secondary | ICD-10-CM | POA: Diagnosis not present

## 2018-10-15 DIAGNOSIS — N2581 Secondary hyperparathyroidism of renal origin: Secondary | ICD-10-CM | POA: Diagnosis not present

## 2018-10-15 DIAGNOSIS — E440 Moderate protein-calorie malnutrition: Secondary | ICD-10-CM | POA: Diagnosis not present

## 2018-10-15 MED ORDER — ONDANSETRON HCL 4 MG PO TABS: 4.0000 mg | ORAL_TABLET | Freq: Three times a day (TID) | ORAL | 1 refills | Status: DC | PRN

## 2018-10-15 NOTE — Progress Notes (Signed)
This patient contacted our office requesting a physician telemedicine video consultation regarding clinical questions and/or test results.  If new patient, they were referred by Dr. Arty Baumgartner [facility])  Participants on the phone call myself and patient   The patient consented to phone consultation and was aware that a charge will be placed through their insurance.  I was in my office and the patient was at home on her phone  Total encounter time:  21 minutes   Wilfrid Lund, MD    _________________________________________________________          [facility] Consult Note:  History: Maureen Barnes 10/15/2018  Referring physician: Wenda Low, MD  Reason for consult/chief complaint: No chief complaint on file.   Subjective  HPI:  Several  months  Nausea, dyspepsia, heartburn, headaches ESRD on PD since October 2019 No dysphagia or weight loss  ROS:  Review of Systems   Past Medical History: Past Medical History:  Diagnosis Date  . Anemia   . Diabetes mellitus   . Hep C w/o coma, chronic ([place])   . Hypertension   . Renal disorder      Past Surgical History: Past Surgical History:  Procedure Laterality Date  . TUBAL LIGATION       Family History: Family History  Problem Relation Age of Onset  . Hypertension Mother   . Cancer Mother   . Breast cancer Mother   . Diabetes Father   . Hypertension Father     Social History: Social History   Socioeconomic History  . Marital status: Single    Spouse name: Not on file  . Number of children: Not on file  . Years of education: Not on file  . Highest education level: Not on file  Occupational History  . Not on file  Social Needs  . Financial resource strain: Not on file  . Food insecurity:    Worry: Not on file    Inability: Not on file  . Transportation needs:    Medical: Not on file    Non-medical: Not on file  Tobacco Use  . Smoking status: Never Smoker  .  Smokeless tobacco: Never Used  Substance and Sexual Activity  . Alcohol use: No  . Drug use: No  . Sexual activity: Not Currently    Birth control/protection: Surgical  Lifestyle  . Physical activity:    Days per week: Not on file    Minutes per session: Not on file  . Stress: Not on file  Relationships  . Social connections:    Talks on phone: Not on file    Gets together: Not on file    Attends religious service: Not on file    Active member of club or organization: Not on file    Attends meetings of clubs or organizations: Not on file    Relationship status: Not on file  Other Topics Concern  . Not on file  Social History Narrative  . Not on file    Allergies: Allergies  Allergen Reactions  . Peanut Oil Anaphylaxis, Itching and Swelling    Tree nuts  . Shellfish-Derived Products     Lips and thraot swell  . Iodine Itching and Swelling    Shellfish-lips swell itching.  . Peanut-Containing Drug Products Itching and Swelling  . Shellfish Allergy Itching and Swelling    Outpatient Meds: Current Outpatient Medications  Medication Sig Dispense Refill  . amLODipine (NORVASC) 10 MG tablet Take 10 mg by mouth daily.      Marland Kitchen  aspirin EC 81 MG tablet Take 81 mg by mouth daily.    Marland Kitchen atorvastatin (LIPITOR) 40 MG tablet Take 40 mg by mouth daily.    Marland Kitchen b complex-vitamin c-folic acid (NEPHRO-VITE) 0.8 MG TABS tablet Take 1 tablet by mouth daily.  6  . calcitRIOL (ROCALTROL) 0.25 MCG capsule Take 0.25 mcg by mouth daily. Take 2 caps daily    . carvedilol (COREG) 12.5 MG tablet Take 12.5 mg by mouth 2 (two) times daily with a meal.    . cephALEXin (KEFLEX) 500 MG capsule Take 1 capsule (500 mg total) by mouth 2 (two) times daily. (Patient not taking: Reported on 09/26/2018) 10 capsule 0  . ciclopirox (PENLAC) 8 % solution Apply topically at bedtime. Apply over nail and surrounding skin. Apply daily over previous coat. After seven (7) days, may remove with alcohol and continue cycle. 6.6  mL 0  . cinacalcet (SENSIPAR) 30 MG tablet TAKE 1 TABLET BY MOUTH EVERY OTHER DAY THREE DAYS A WEEK (3 TIMES A WEEK)  3  . diphenhydrAMINE (BENADRYL) 25 MG tablet Take 1 tablet (25 mg total) by mouth at bedtime as needed. (Patient not taking: Reported on 09/26/2018) 30 tablet 0  . docusate sodium (COLACE) 100 MG capsule Take 100 mg by mouth 2 (two) times daily.    . ferrous sulfate 325 (65 FE) MG tablet Take by mouth.    Marland Kitchen gentamicin cream (GARAMYCIN) 0.1 % APPLY SMALL AMOUNT TO EXIT SITE DAILY  3  . HYDROcodone-acetaminophen (NORCO/VICODIN) 5-325 MG tablet TAKE 1 TABLET BY MOUTH EVERY 4 HOURS AS NEEDED FOR UP TO 5 DAYS.  0  . Insulin Glargine (LANTUS SOLOSTAR ) Inject 26 Units into the skin daily.    Marland Kitchen lactulose, encephalopathy, (CHRONULAC) 10 GM/15ML SOLN Take 10 g by mouth.    . loratadine (CLARITIN) 10 MG tablet Take 10 mg by mouth as directed.    Marland Kitchen losartan-hydrochlorothiazide (HYZAAR) 100-25 MG tablet Take 1 tablet by mouth daily.    . ondansetron (ZOFRAN) 4 MG tablet Take 1 tablet (4 mg total) by mouth every 8 (eight) hours as needed for nausea or vomiting. 45 tablet 1  . pantoprazole (PROTONIX) 40 MG tablet Take 40 mg by mouth daily.    . ranitidine (ZANTAC) 150 MG capsule Take 150 mg by mouth every evening.    . simethicone (MYLICON) 80 MG chewable tablet Chew 80 mg by mouth every 6 (six) hours as needed for flatulence.    Marland Kitchen tiZANidine (ZANAFLEX) 2 MG tablet Take by mouth.     No current facility-administered medications for this visit.     Doesn't take aspirin much due to dyspepsia No longer taking iron tablets b/c getting IV iron  Takes occ'l famotidine for heartburn Not clear if still taking pantoprazole  ___________________________________________________________________ Objective    Labs:  09/25/18 (only records sent by [facility]) Wbc 5.9 hgb 9.6  pls 363 BUN 44  Creatinine 5.9  Assessment: Encounter Diagnoses  Name Primary?  . Dyspepsia Yes  . Nausea and  vomiting in adult    Likely related to ESRD/new PD    Plan:  Rx sent for zofran Our office will call her for a formal office visit in about 6 weeks , at which time hopefully current pandemic social distance restrictions will be lifted.  Thank you for the courtesy of this consult.  Please call me with any questions or concerns.  Nelida Meuse III  CC: Referring provider noted above

## 2018-10-16 DIAGNOSIS — N186 End stage renal disease: Secondary | ICD-10-CM | POA: Diagnosis not present

## 2018-10-16 DIAGNOSIS — D631 Anemia in chronic kidney disease: Secondary | ICD-10-CM | POA: Diagnosis not present

## 2018-10-16 DIAGNOSIS — Z79899 Other long term (current) drug therapy: Secondary | ICD-10-CM | POA: Diagnosis not present

## 2018-10-16 DIAGNOSIS — N2581 Secondary hyperparathyroidism of renal origin: Secondary | ICD-10-CM | POA: Diagnosis not present

## 2018-10-16 DIAGNOSIS — D509 Iron deficiency anemia, unspecified: Secondary | ICD-10-CM | POA: Diagnosis not present

## 2018-10-16 DIAGNOSIS — E440 Moderate protein-calorie malnutrition: Secondary | ICD-10-CM | POA: Diagnosis not present

## 2018-10-17 DIAGNOSIS — N2581 Secondary hyperparathyroidism of renal origin: Secondary | ICD-10-CM | POA: Diagnosis not present

## 2018-10-17 DIAGNOSIS — N186 End stage renal disease: Secondary | ICD-10-CM | POA: Diagnosis not present

## 2018-10-17 DIAGNOSIS — D631 Anemia in chronic kidney disease: Secondary | ICD-10-CM | POA: Diagnosis not present

## 2018-10-17 DIAGNOSIS — E440 Moderate protein-calorie malnutrition: Secondary | ICD-10-CM | POA: Diagnosis not present

## 2018-10-17 DIAGNOSIS — D509 Iron deficiency anemia, unspecified: Secondary | ICD-10-CM | POA: Diagnosis not present

## 2018-10-17 DIAGNOSIS — Z79899 Other long term (current) drug therapy: Secondary | ICD-10-CM | POA: Diagnosis not present

## 2018-10-18 DIAGNOSIS — D631 Anemia in chronic kidney disease: Secondary | ICD-10-CM | POA: Diagnosis not present

## 2018-10-18 DIAGNOSIS — N2581 Secondary hyperparathyroidism of renal origin: Secondary | ICD-10-CM | POA: Diagnosis not present

## 2018-10-18 DIAGNOSIS — Z79899 Other long term (current) drug therapy: Secondary | ICD-10-CM | POA: Diagnosis not present

## 2018-10-18 DIAGNOSIS — N186 End stage renal disease: Secondary | ICD-10-CM | POA: Diagnosis not present

## 2018-10-18 DIAGNOSIS — D509 Iron deficiency anemia, unspecified: Secondary | ICD-10-CM | POA: Diagnosis not present

## 2018-10-18 DIAGNOSIS — E440 Moderate protein-calorie malnutrition: Secondary | ICD-10-CM | POA: Diagnosis not present

## 2018-10-19 DIAGNOSIS — N2581 Secondary hyperparathyroidism of renal origin: Secondary | ICD-10-CM | POA: Diagnosis not present

## 2018-10-19 DIAGNOSIS — Z79899 Other long term (current) drug therapy: Secondary | ICD-10-CM | POA: Diagnosis not present

## 2018-10-19 DIAGNOSIS — E440 Moderate protein-calorie malnutrition: Secondary | ICD-10-CM | POA: Diagnosis not present

## 2018-10-19 DIAGNOSIS — D509 Iron deficiency anemia, unspecified: Secondary | ICD-10-CM | POA: Diagnosis not present

## 2018-10-19 DIAGNOSIS — D631 Anemia in chronic kidney disease: Secondary | ICD-10-CM | POA: Diagnosis not present

## 2018-10-19 DIAGNOSIS — N186 End stage renal disease: Secondary | ICD-10-CM | POA: Diagnosis not present

## 2018-10-20 DIAGNOSIS — N186 End stage renal disease: Secondary | ICD-10-CM | POA: Diagnosis not present

## 2018-10-20 DIAGNOSIS — E440 Moderate protein-calorie malnutrition: Secondary | ICD-10-CM | POA: Diagnosis not present

## 2018-10-20 DIAGNOSIS — D631 Anemia in chronic kidney disease: Secondary | ICD-10-CM | POA: Diagnosis not present

## 2018-10-20 DIAGNOSIS — Z79899 Other long term (current) drug therapy: Secondary | ICD-10-CM | POA: Diagnosis not present

## 2018-10-20 DIAGNOSIS — D509 Iron deficiency anemia, unspecified: Secondary | ICD-10-CM | POA: Diagnosis not present

## 2018-10-20 DIAGNOSIS — N2581 Secondary hyperparathyroidism of renal origin: Secondary | ICD-10-CM | POA: Diagnosis not present

## 2018-10-21 DIAGNOSIS — N2581 Secondary hyperparathyroidism of renal origin: Secondary | ICD-10-CM | POA: Diagnosis not present

## 2018-10-21 DIAGNOSIS — D631 Anemia in chronic kidney disease: Secondary | ICD-10-CM | POA: Diagnosis not present

## 2018-10-21 DIAGNOSIS — N186 End stage renal disease: Secondary | ICD-10-CM | POA: Diagnosis not present

## 2018-10-21 DIAGNOSIS — D509 Iron deficiency anemia, unspecified: Secondary | ICD-10-CM | POA: Diagnosis not present

## 2018-10-21 DIAGNOSIS — Z79899 Other long term (current) drug therapy: Secondary | ICD-10-CM | POA: Diagnosis not present

## 2018-10-21 DIAGNOSIS — E440 Moderate protein-calorie malnutrition: Secondary | ICD-10-CM | POA: Diagnosis not present

## 2018-10-22 DIAGNOSIS — R3915 Urgency of urination: Secondary | ICD-10-CM | POA: Diagnosis not present

## 2018-10-22 DIAGNOSIS — D509 Iron deficiency anemia, unspecified: Secondary | ICD-10-CM | POA: Diagnosis not present

## 2018-10-22 DIAGNOSIS — N2581 Secondary hyperparathyroidism of renal origin: Secondary | ICD-10-CM | POA: Diagnosis not present

## 2018-10-22 DIAGNOSIS — N951 Menopausal and female climacteric states: Secondary | ICD-10-CM | POA: Diagnosis not present

## 2018-10-22 DIAGNOSIS — Z992 Dependence on renal dialysis: Secondary | ICD-10-CM | POA: Diagnosis not present

## 2018-10-22 DIAGNOSIS — Z4932 Encounter for adequacy testing for peritoneal dialysis: Secondary | ICD-10-CM | POA: Diagnosis not present

## 2018-10-22 DIAGNOSIS — D631 Anemia in chronic kidney disease: Secondary | ICD-10-CM | POA: Diagnosis not present

## 2018-10-22 DIAGNOSIS — N2589 Other disorders resulting from impaired renal tubular function: Secondary | ICD-10-CM | POA: Diagnosis not present

## 2018-10-22 DIAGNOSIS — I129 Hypertensive chronic kidney disease with stage 1 through stage 4 chronic kidney disease, or unspecified chronic kidney disease: Secondary | ICD-10-CM | POA: Diagnosis not present

## 2018-10-22 DIAGNOSIS — N186 End stage renal disease: Secondary | ICD-10-CM | POA: Diagnosis not present

## 2018-10-23 DIAGNOSIS — D631 Anemia in chronic kidney disease: Secondary | ICD-10-CM | POA: Diagnosis not present

## 2018-10-23 DIAGNOSIS — N2589 Other disorders resulting from impaired renal tubular function: Secondary | ICD-10-CM | POA: Diagnosis not present

## 2018-10-23 DIAGNOSIS — N186 End stage renal disease: Secondary | ICD-10-CM | POA: Diagnosis not present

## 2018-10-23 DIAGNOSIS — R3915 Urgency of urination: Secondary | ICD-10-CM | POA: Diagnosis not present

## 2018-10-23 DIAGNOSIS — N2581 Secondary hyperparathyroidism of renal origin: Secondary | ICD-10-CM | POA: Diagnosis not present

## 2018-10-23 DIAGNOSIS — Z4932 Encounter for adequacy testing for peritoneal dialysis: Secondary | ICD-10-CM | POA: Diagnosis not present

## 2018-10-23 DIAGNOSIS — N951 Menopausal and female climacteric states: Secondary | ICD-10-CM | POA: Diagnosis not present

## 2018-10-23 DIAGNOSIS — D509 Iron deficiency anemia, unspecified: Secondary | ICD-10-CM | POA: Diagnosis not present

## 2018-10-24 DIAGNOSIS — Z4932 Encounter for adequacy testing for peritoneal dialysis: Secondary | ICD-10-CM | POA: Diagnosis not present

## 2018-10-24 DIAGNOSIS — N2581 Secondary hyperparathyroidism of renal origin: Secondary | ICD-10-CM | POA: Diagnosis not present

## 2018-10-24 DIAGNOSIS — D631 Anemia in chronic kidney disease: Secondary | ICD-10-CM | POA: Diagnosis not present

## 2018-10-24 DIAGNOSIS — N186 End stage renal disease: Secondary | ICD-10-CM | POA: Diagnosis not present

## 2018-10-24 DIAGNOSIS — D509 Iron deficiency anemia, unspecified: Secondary | ICD-10-CM | POA: Diagnosis not present

## 2018-10-24 DIAGNOSIS — N2589 Other disorders resulting from impaired renal tubular function: Secondary | ICD-10-CM | POA: Diagnosis not present

## 2018-10-25 DIAGNOSIS — Z4932 Encounter for adequacy testing for peritoneal dialysis: Secondary | ICD-10-CM | POA: Diagnosis not present

## 2018-10-25 DIAGNOSIS — N186 End stage renal disease: Secondary | ICD-10-CM | POA: Diagnosis not present

## 2018-10-25 DIAGNOSIS — D509 Iron deficiency anemia, unspecified: Secondary | ICD-10-CM | POA: Diagnosis not present

## 2018-10-25 DIAGNOSIS — N2589 Other disorders resulting from impaired renal tubular function: Secondary | ICD-10-CM | POA: Diagnosis not present

## 2018-10-25 DIAGNOSIS — N2581 Secondary hyperparathyroidism of renal origin: Secondary | ICD-10-CM | POA: Diagnosis not present

## 2018-10-25 DIAGNOSIS — D631 Anemia in chronic kidney disease: Secondary | ICD-10-CM | POA: Diagnosis not present

## 2018-10-26 DIAGNOSIS — D509 Iron deficiency anemia, unspecified: Secondary | ICD-10-CM | POA: Diagnosis not present

## 2018-10-26 DIAGNOSIS — N2581 Secondary hyperparathyroidism of renal origin: Secondary | ICD-10-CM | POA: Diagnosis not present

## 2018-10-26 DIAGNOSIS — D631 Anemia in chronic kidney disease: Secondary | ICD-10-CM | POA: Diagnosis not present

## 2018-10-26 DIAGNOSIS — N2589 Other disorders resulting from impaired renal tubular function: Secondary | ICD-10-CM | POA: Diagnosis not present

## 2018-10-26 DIAGNOSIS — N186 End stage renal disease: Secondary | ICD-10-CM | POA: Diagnosis not present

## 2018-10-26 DIAGNOSIS — Z4932 Encounter for adequacy testing for peritoneal dialysis: Secondary | ICD-10-CM | POA: Diagnosis not present

## 2018-10-27 DIAGNOSIS — R82994 Hypercalciuria: Secondary | ICD-10-CM | POA: Diagnosis not present

## 2018-10-27 DIAGNOSIS — D509 Iron deficiency anemia, unspecified: Secondary | ICD-10-CM | POA: Diagnosis not present

## 2018-10-27 DIAGNOSIS — N2581 Secondary hyperparathyroidism of renal origin: Secondary | ICD-10-CM | POA: Diagnosis not present

## 2018-10-27 DIAGNOSIS — N186 End stage renal disease: Secondary | ICD-10-CM | POA: Diagnosis not present

## 2018-10-27 DIAGNOSIS — N2589 Other disorders resulting from impaired renal tubular function: Secondary | ICD-10-CM | POA: Diagnosis not present

## 2018-10-27 DIAGNOSIS — Z4932 Encounter for adequacy testing for peritoneal dialysis: Secondary | ICD-10-CM | POA: Diagnosis not present

## 2018-10-27 DIAGNOSIS — E1129 Type 2 diabetes mellitus with other diabetic kidney complication: Secondary | ICD-10-CM | POA: Diagnosis not present

## 2018-10-27 DIAGNOSIS — D631 Anemia in chronic kidney disease: Secondary | ICD-10-CM | POA: Diagnosis not present

## 2018-10-28 DIAGNOSIS — N2589 Other disorders resulting from impaired renal tubular function: Secondary | ICD-10-CM | POA: Diagnosis not present

## 2018-10-28 DIAGNOSIS — N2581 Secondary hyperparathyroidism of renal origin: Secondary | ICD-10-CM | POA: Diagnosis not present

## 2018-10-28 DIAGNOSIS — N186 End stage renal disease: Secondary | ICD-10-CM | POA: Diagnosis not present

## 2018-10-28 DIAGNOSIS — D631 Anemia in chronic kidney disease: Secondary | ICD-10-CM | POA: Diagnosis not present

## 2018-10-28 DIAGNOSIS — D509 Iron deficiency anemia, unspecified: Secondary | ICD-10-CM | POA: Diagnosis not present

## 2018-10-28 DIAGNOSIS — Z4932 Encounter for adequacy testing for peritoneal dialysis: Secondary | ICD-10-CM | POA: Diagnosis not present

## 2018-10-29 DIAGNOSIS — N186 End stage renal disease: Secondary | ICD-10-CM | POA: Diagnosis not present

## 2018-10-29 DIAGNOSIS — D631 Anemia in chronic kidney disease: Secondary | ICD-10-CM | POA: Diagnosis not present

## 2018-10-29 DIAGNOSIS — D509 Iron deficiency anemia, unspecified: Secondary | ICD-10-CM | POA: Diagnosis not present

## 2018-10-29 DIAGNOSIS — Z4932 Encounter for adequacy testing for peritoneal dialysis: Secondary | ICD-10-CM | POA: Diagnosis not present

## 2018-10-29 DIAGNOSIS — N2581 Secondary hyperparathyroidism of renal origin: Secondary | ICD-10-CM | POA: Diagnosis not present

## 2018-10-29 DIAGNOSIS — N2589 Other disorders resulting from impaired renal tubular function: Secondary | ICD-10-CM | POA: Diagnosis not present

## 2018-10-30 ENCOUNTER — Telehealth: Payer: Self-pay | Admitting: Dietician

## 2018-10-30 DIAGNOSIS — Z4932 Encounter for adequacy testing for peritoneal dialysis: Secondary | ICD-10-CM | POA: Diagnosis not present

## 2018-10-30 DIAGNOSIS — N186 End stage renal disease: Secondary | ICD-10-CM | POA: Diagnosis not present

## 2018-10-30 DIAGNOSIS — N2581 Secondary hyperparathyroidism of renal origin: Secondary | ICD-10-CM | POA: Diagnosis not present

## 2018-10-30 DIAGNOSIS — D509 Iron deficiency anemia, unspecified: Secondary | ICD-10-CM | POA: Diagnosis not present

## 2018-10-30 DIAGNOSIS — N2589 Other disorders resulting from impaired renal tubular function: Secondary | ICD-10-CM | POA: Diagnosis not present

## 2018-10-30 DIAGNOSIS — D631 Anemia in chronic kidney disease: Secondary | ICD-10-CM | POA: Diagnosis not present

## 2018-10-30 NOTE — Telephone Encounter (Signed)
Called patient and rescheduled her nutrition appointment for tomorrow due to COVID-19.   Patient states that she feels that she has been gaining weight and is now looking at portion control and measuring more frequently.  She has adequate food and is aiming to eat as healthy as she can. She verbalized not needs and has not questions at this time.  Maureen Barnes, RD, LDN, CDE

## 2018-10-31 ENCOUNTER — Ambulatory Visit: Payer: Medicare Other | Admitting: Dietician

## 2018-10-31 DIAGNOSIS — D509 Iron deficiency anemia, unspecified: Secondary | ICD-10-CM | POA: Diagnosis not present

## 2018-10-31 DIAGNOSIS — N186 End stage renal disease: Secondary | ICD-10-CM | POA: Diagnosis not present

## 2018-10-31 DIAGNOSIS — N2581 Secondary hyperparathyroidism of renal origin: Secondary | ICD-10-CM | POA: Diagnosis not present

## 2018-10-31 DIAGNOSIS — Z4932 Encounter for adequacy testing for peritoneal dialysis: Secondary | ICD-10-CM | POA: Diagnosis not present

## 2018-10-31 DIAGNOSIS — N2589 Other disorders resulting from impaired renal tubular function: Secondary | ICD-10-CM | POA: Diagnosis not present

## 2018-10-31 DIAGNOSIS — D631 Anemia in chronic kidney disease: Secondary | ICD-10-CM | POA: Diagnosis not present

## 2018-11-01 DIAGNOSIS — N2581 Secondary hyperparathyroidism of renal origin: Secondary | ICD-10-CM | POA: Diagnosis not present

## 2018-11-01 DIAGNOSIS — N2589 Other disorders resulting from impaired renal tubular function: Secondary | ICD-10-CM | POA: Diagnosis not present

## 2018-11-01 DIAGNOSIS — Z4932 Encounter for adequacy testing for peritoneal dialysis: Secondary | ICD-10-CM | POA: Diagnosis not present

## 2018-11-01 DIAGNOSIS — N186 End stage renal disease: Secondary | ICD-10-CM | POA: Diagnosis not present

## 2018-11-01 DIAGNOSIS — D631 Anemia in chronic kidney disease: Secondary | ICD-10-CM | POA: Diagnosis not present

## 2018-11-01 DIAGNOSIS — D509 Iron deficiency anemia, unspecified: Secondary | ICD-10-CM | POA: Diagnosis not present

## 2018-11-02 DIAGNOSIS — N2589 Other disorders resulting from impaired renal tubular function: Secondary | ICD-10-CM | POA: Diagnosis not present

## 2018-11-02 DIAGNOSIS — D631 Anemia in chronic kidney disease: Secondary | ICD-10-CM | POA: Diagnosis not present

## 2018-11-02 DIAGNOSIS — N2581 Secondary hyperparathyroidism of renal origin: Secondary | ICD-10-CM | POA: Diagnosis not present

## 2018-11-02 DIAGNOSIS — Z4932 Encounter for adequacy testing for peritoneal dialysis: Secondary | ICD-10-CM | POA: Diagnosis not present

## 2018-11-02 DIAGNOSIS — N186 End stage renal disease: Secondary | ICD-10-CM | POA: Diagnosis not present

## 2018-11-02 DIAGNOSIS — D509 Iron deficiency anemia, unspecified: Secondary | ICD-10-CM | POA: Diagnosis not present

## 2018-11-03 DIAGNOSIS — Z4932 Encounter for adequacy testing for peritoneal dialysis: Secondary | ICD-10-CM | POA: Diagnosis not present

## 2018-11-03 DIAGNOSIS — N186 End stage renal disease: Secondary | ICD-10-CM | POA: Diagnosis not present

## 2018-11-03 DIAGNOSIS — D631 Anemia in chronic kidney disease: Secondary | ICD-10-CM | POA: Diagnosis not present

## 2018-11-03 DIAGNOSIS — N2581 Secondary hyperparathyroidism of renal origin: Secondary | ICD-10-CM | POA: Diagnosis not present

## 2018-11-03 DIAGNOSIS — N2589 Other disorders resulting from impaired renal tubular function: Secondary | ICD-10-CM | POA: Diagnosis not present

## 2018-11-03 DIAGNOSIS — D509 Iron deficiency anemia, unspecified: Secondary | ICD-10-CM | POA: Diagnosis not present

## 2018-11-04 DIAGNOSIS — D631 Anemia in chronic kidney disease: Secondary | ICD-10-CM | POA: Diagnosis not present

## 2018-11-04 DIAGNOSIS — D509 Iron deficiency anemia, unspecified: Secondary | ICD-10-CM | POA: Diagnosis not present

## 2018-11-04 DIAGNOSIS — Z4932 Encounter for adequacy testing for peritoneal dialysis: Secondary | ICD-10-CM | POA: Diagnosis not present

## 2018-11-04 DIAGNOSIS — N2589 Other disorders resulting from impaired renal tubular function: Secondary | ICD-10-CM | POA: Diagnosis not present

## 2018-11-04 DIAGNOSIS — N186 End stage renal disease: Secondary | ICD-10-CM | POA: Diagnosis not present

## 2018-11-04 DIAGNOSIS — N2581 Secondary hyperparathyroidism of renal origin: Secondary | ICD-10-CM | POA: Diagnosis not present

## 2018-11-05 DIAGNOSIS — N186 End stage renal disease: Secondary | ICD-10-CM | POA: Diagnosis not present

## 2018-11-05 DIAGNOSIS — D631 Anemia in chronic kidney disease: Secondary | ICD-10-CM | POA: Diagnosis not present

## 2018-11-05 DIAGNOSIS — D509 Iron deficiency anemia, unspecified: Secondary | ICD-10-CM | POA: Diagnosis not present

## 2018-11-05 DIAGNOSIS — Z4932 Encounter for adequacy testing for peritoneal dialysis: Secondary | ICD-10-CM | POA: Diagnosis not present

## 2018-11-05 DIAGNOSIS — N2581 Secondary hyperparathyroidism of renal origin: Secondary | ICD-10-CM | POA: Diagnosis not present

## 2018-11-05 DIAGNOSIS — N2589 Other disorders resulting from impaired renal tubular function: Secondary | ICD-10-CM | POA: Diagnosis not present

## 2018-11-06 DIAGNOSIS — N2589 Other disorders resulting from impaired renal tubular function: Secondary | ICD-10-CM | POA: Diagnosis not present

## 2018-11-06 DIAGNOSIS — D631 Anemia in chronic kidney disease: Secondary | ICD-10-CM | POA: Diagnosis not present

## 2018-11-06 DIAGNOSIS — N186 End stage renal disease: Secondary | ICD-10-CM | POA: Diagnosis not present

## 2018-11-06 DIAGNOSIS — N2581 Secondary hyperparathyroidism of renal origin: Secondary | ICD-10-CM | POA: Diagnosis not present

## 2018-11-06 DIAGNOSIS — Z4932 Encounter for adequacy testing for peritoneal dialysis: Secondary | ICD-10-CM | POA: Diagnosis not present

## 2018-11-06 DIAGNOSIS — D509 Iron deficiency anemia, unspecified: Secondary | ICD-10-CM | POA: Diagnosis not present

## 2018-11-07 DIAGNOSIS — D509 Iron deficiency anemia, unspecified: Secondary | ICD-10-CM | POA: Diagnosis not present

## 2018-11-07 DIAGNOSIS — N186 End stage renal disease: Secondary | ICD-10-CM | POA: Diagnosis not present

## 2018-11-07 DIAGNOSIS — N2589 Other disorders resulting from impaired renal tubular function: Secondary | ICD-10-CM | POA: Diagnosis not present

## 2018-11-07 DIAGNOSIS — Z4932 Encounter for adequacy testing for peritoneal dialysis: Secondary | ICD-10-CM | POA: Diagnosis not present

## 2018-11-07 DIAGNOSIS — D631 Anemia in chronic kidney disease: Secondary | ICD-10-CM | POA: Diagnosis not present

## 2018-11-07 DIAGNOSIS — N2581 Secondary hyperparathyroidism of renal origin: Secondary | ICD-10-CM | POA: Diagnosis not present

## 2018-11-08 DIAGNOSIS — N186 End stage renal disease: Secondary | ICD-10-CM | POA: Diagnosis not present

## 2018-11-08 DIAGNOSIS — N2589 Other disorders resulting from impaired renal tubular function: Secondary | ICD-10-CM | POA: Diagnosis not present

## 2018-11-08 DIAGNOSIS — D631 Anemia in chronic kidney disease: Secondary | ICD-10-CM | POA: Diagnosis not present

## 2018-11-08 DIAGNOSIS — N2581 Secondary hyperparathyroidism of renal origin: Secondary | ICD-10-CM | POA: Diagnosis not present

## 2018-11-08 DIAGNOSIS — Z4932 Encounter for adequacy testing for peritoneal dialysis: Secondary | ICD-10-CM | POA: Diagnosis not present

## 2018-11-08 DIAGNOSIS — D509 Iron deficiency anemia, unspecified: Secondary | ICD-10-CM | POA: Diagnosis not present

## 2018-11-09 DIAGNOSIS — D509 Iron deficiency anemia, unspecified: Secondary | ICD-10-CM | POA: Diagnosis not present

## 2018-11-09 DIAGNOSIS — Z4932 Encounter for adequacy testing for peritoneal dialysis: Secondary | ICD-10-CM | POA: Diagnosis not present

## 2018-11-09 DIAGNOSIS — N2589 Other disorders resulting from impaired renal tubular function: Secondary | ICD-10-CM | POA: Diagnosis not present

## 2018-11-09 DIAGNOSIS — D631 Anemia in chronic kidney disease: Secondary | ICD-10-CM | POA: Diagnosis not present

## 2018-11-09 DIAGNOSIS — N2581 Secondary hyperparathyroidism of renal origin: Secondary | ICD-10-CM | POA: Diagnosis not present

## 2018-11-09 DIAGNOSIS — N186 End stage renal disease: Secondary | ICD-10-CM | POA: Diagnosis not present

## 2018-11-10 DIAGNOSIS — N2589 Other disorders resulting from impaired renal tubular function: Secondary | ICD-10-CM | POA: Diagnosis not present

## 2018-11-10 DIAGNOSIS — D509 Iron deficiency anemia, unspecified: Secondary | ICD-10-CM | POA: Diagnosis not present

## 2018-11-10 DIAGNOSIS — D631 Anemia in chronic kidney disease: Secondary | ICD-10-CM | POA: Diagnosis not present

## 2018-11-10 DIAGNOSIS — Z4932 Encounter for adequacy testing for peritoneal dialysis: Secondary | ICD-10-CM | POA: Diagnosis not present

## 2018-11-10 DIAGNOSIS — N186 End stage renal disease: Secondary | ICD-10-CM | POA: Diagnosis not present

## 2018-11-10 DIAGNOSIS — N2581 Secondary hyperparathyroidism of renal origin: Secondary | ICD-10-CM | POA: Diagnosis not present

## 2018-11-11 DIAGNOSIS — N186 End stage renal disease: Secondary | ICD-10-CM | POA: Diagnosis not present

## 2018-11-11 DIAGNOSIS — N2589 Other disorders resulting from impaired renal tubular function: Secondary | ICD-10-CM | POA: Diagnosis not present

## 2018-11-11 DIAGNOSIS — D631 Anemia in chronic kidney disease: Secondary | ICD-10-CM | POA: Diagnosis not present

## 2018-11-11 DIAGNOSIS — N2581 Secondary hyperparathyroidism of renal origin: Secondary | ICD-10-CM | POA: Diagnosis not present

## 2018-11-11 DIAGNOSIS — D509 Iron deficiency anemia, unspecified: Secondary | ICD-10-CM | POA: Diagnosis not present

## 2018-11-11 DIAGNOSIS — Z4932 Encounter for adequacy testing for peritoneal dialysis: Secondary | ICD-10-CM | POA: Diagnosis not present

## 2018-11-12 DIAGNOSIS — N2581 Secondary hyperparathyroidism of renal origin: Secondary | ICD-10-CM | POA: Diagnosis not present

## 2018-11-12 DIAGNOSIS — D509 Iron deficiency anemia, unspecified: Secondary | ICD-10-CM | POA: Diagnosis not present

## 2018-11-12 DIAGNOSIS — N186 End stage renal disease: Secondary | ICD-10-CM | POA: Diagnosis not present

## 2018-11-12 DIAGNOSIS — N2589 Other disorders resulting from impaired renal tubular function: Secondary | ICD-10-CM | POA: Diagnosis not present

## 2018-11-12 DIAGNOSIS — D631 Anemia in chronic kidney disease: Secondary | ICD-10-CM | POA: Diagnosis not present

## 2018-11-12 DIAGNOSIS — Z4932 Encounter for adequacy testing for peritoneal dialysis: Secondary | ICD-10-CM | POA: Diagnosis not present

## 2018-11-13 DIAGNOSIS — N2589 Other disorders resulting from impaired renal tubular function: Secondary | ICD-10-CM | POA: Diagnosis not present

## 2018-11-13 DIAGNOSIS — N186 End stage renal disease: Secondary | ICD-10-CM | POA: Diagnosis not present

## 2018-11-13 DIAGNOSIS — D509 Iron deficiency anemia, unspecified: Secondary | ICD-10-CM | POA: Diagnosis not present

## 2018-11-13 DIAGNOSIS — D631 Anemia in chronic kidney disease: Secondary | ICD-10-CM | POA: Diagnosis not present

## 2018-11-13 DIAGNOSIS — Z4932 Encounter for adequacy testing for peritoneal dialysis: Secondary | ICD-10-CM | POA: Diagnosis not present

## 2018-11-13 DIAGNOSIS — N2581 Secondary hyperparathyroidism of renal origin: Secondary | ICD-10-CM | POA: Diagnosis not present

## 2018-11-14 DIAGNOSIS — N186 End stage renal disease: Secondary | ICD-10-CM | POA: Diagnosis not present

## 2018-11-14 DIAGNOSIS — N2589 Other disorders resulting from impaired renal tubular function: Secondary | ICD-10-CM | POA: Diagnosis not present

## 2018-11-14 DIAGNOSIS — N2581 Secondary hyperparathyroidism of renal origin: Secondary | ICD-10-CM | POA: Diagnosis not present

## 2018-11-14 DIAGNOSIS — D631 Anemia in chronic kidney disease: Secondary | ICD-10-CM | POA: Diagnosis not present

## 2018-11-14 DIAGNOSIS — Z4932 Encounter for adequacy testing for peritoneal dialysis: Secondary | ICD-10-CM | POA: Diagnosis not present

## 2018-11-14 DIAGNOSIS — D509 Iron deficiency anemia, unspecified: Secondary | ICD-10-CM | POA: Diagnosis not present

## 2018-11-15 DIAGNOSIS — D631 Anemia in chronic kidney disease: Secondary | ICD-10-CM | POA: Diagnosis not present

## 2018-11-15 DIAGNOSIS — Z4932 Encounter for adequacy testing for peritoneal dialysis: Secondary | ICD-10-CM | POA: Diagnosis not present

## 2018-11-15 DIAGNOSIS — N2581 Secondary hyperparathyroidism of renal origin: Secondary | ICD-10-CM | POA: Diagnosis not present

## 2018-11-15 DIAGNOSIS — N2589 Other disorders resulting from impaired renal tubular function: Secondary | ICD-10-CM | POA: Diagnosis not present

## 2018-11-15 DIAGNOSIS — D509 Iron deficiency anemia, unspecified: Secondary | ICD-10-CM | POA: Diagnosis not present

## 2018-11-15 DIAGNOSIS — N186 End stage renal disease: Secondary | ICD-10-CM | POA: Diagnosis not present

## 2018-11-16 DIAGNOSIS — D509 Iron deficiency anemia, unspecified: Secondary | ICD-10-CM | POA: Diagnosis not present

## 2018-11-16 DIAGNOSIS — D631 Anemia in chronic kidney disease: Secondary | ICD-10-CM | POA: Diagnosis not present

## 2018-11-16 DIAGNOSIS — Z4932 Encounter for adequacy testing for peritoneal dialysis: Secondary | ICD-10-CM | POA: Diagnosis not present

## 2018-11-16 DIAGNOSIS — N2581 Secondary hyperparathyroidism of renal origin: Secondary | ICD-10-CM | POA: Diagnosis not present

## 2018-11-16 DIAGNOSIS — N2589 Other disorders resulting from impaired renal tubular function: Secondary | ICD-10-CM | POA: Diagnosis not present

## 2018-11-16 DIAGNOSIS — N186 End stage renal disease: Secondary | ICD-10-CM | POA: Diagnosis not present

## 2018-11-17 DIAGNOSIS — Z4932 Encounter for adequacy testing for peritoneal dialysis: Secondary | ICD-10-CM | POA: Diagnosis not present

## 2018-11-17 DIAGNOSIS — N2589 Other disorders resulting from impaired renal tubular function: Secondary | ICD-10-CM | POA: Diagnosis not present

## 2018-11-17 DIAGNOSIS — N2581 Secondary hyperparathyroidism of renal origin: Secondary | ICD-10-CM | POA: Diagnosis not present

## 2018-11-17 DIAGNOSIS — N186 End stage renal disease: Secondary | ICD-10-CM | POA: Diagnosis not present

## 2018-11-17 DIAGNOSIS — D509 Iron deficiency anemia, unspecified: Secondary | ICD-10-CM | POA: Diagnosis not present

## 2018-11-17 DIAGNOSIS — D631 Anemia in chronic kidney disease: Secondary | ICD-10-CM | POA: Diagnosis not present

## 2018-11-18 DIAGNOSIS — D509 Iron deficiency anemia, unspecified: Secondary | ICD-10-CM | POA: Diagnosis not present

## 2018-11-18 DIAGNOSIS — D631 Anemia in chronic kidney disease: Secondary | ICD-10-CM | POA: Diagnosis not present

## 2018-11-18 DIAGNOSIS — Z4932 Encounter for adequacy testing for peritoneal dialysis: Secondary | ICD-10-CM | POA: Diagnosis not present

## 2018-11-18 DIAGNOSIS — N2589 Other disorders resulting from impaired renal tubular function: Secondary | ICD-10-CM | POA: Diagnosis not present

## 2018-11-18 DIAGNOSIS — N2581 Secondary hyperparathyroidism of renal origin: Secondary | ICD-10-CM | POA: Diagnosis not present

## 2018-11-18 DIAGNOSIS — N186 End stage renal disease: Secondary | ICD-10-CM | POA: Diagnosis not present

## 2018-11-19 DIAGNOSIS — Z4932 Encounter for adequacy testing for peritoneal dialysis: Secondary | ICD-10-CM | POA: Diagnosis not present

## 2018-11-19 DIAGNOSIS — N2589 Other disorders resulting from impaired renal tubular function: Secondary | ICD-10-CM | POA: Diagnosis not present

## 2018-11-19 DIAGNOSIS — N186 End stage renal disease: Secondary | ICD-10-CM | POA: Diagnosis not present

## 2018-11-19 DIAGNOSIS — D631 Anemia in chronic kidney disease: Secondary | ICD-10-CM | POA: Diagnosis not present

## 2018-11-19 DIAGNOSIS — N2581 Secondary hyperparathyroidism of renal origin: Secondary | ICD-10-CM | POA: Diagnosis not present

## 2018-11-19 DIAGNOSIS — D509 Iron deficiency anemia, unspecified: Secondary | ICD-10-CM | POA: Diagnosis not present

## 2018-11-20 DIAGNOSIS — N2589 Other disorders resulting from impaired renal tubular function: Secondary | ICD-10-CM | POA: Diagnosis not present

## 2018-11-20 DIAGNOSIS — N186 End stage renal disease: Secondary | ICD-10-CM | POA: Diagnosis not present

## 2018-11-20 DIAGNOSIS — D509 Iron deficiency anemia, unspecified: Secondary | ICD-10-CM | POA: Diagnosis not present

## 2018-11-20 DIAGNOSIS — N2581 Secondary hyperparathyroidism of renal origin: Secondary | ICD-10-CM | POA: Diagnosis not present

## 2018-11-20 DIAGNOSIS — Z4932 Encounter for adequacy testing for peritoneal dialysis: Secondary | ICD-10-CM | POA: Diagnosis not present

## 2018-11-20 DIAGNOSIS — D631 Anemia in chronic kidney disease: Secondary | ICD-10-CM | POA: Diagnosis not present

## 2018-11-21 DIAGNOSIS — N186 End stage renal disease: Secondary | ICD-10-CM | POA: Diagnosis not present

## 2018-11-21 DIAGNOSIS — E440 Moderate protein-calorie malnutrition: Secondary | ICD-10-CM | POA: Diagnosis not present

## 2018-11-21 DIAGNOSIS — N2581 Secondary hyperparathyroidism of renal origin: Secondary | ICD-10-CM | POA: Diagnosis not present

## 2018-11-21 DIAGNOSIS — D631 Anemia in chronic kidney disease: Secondary | ICD-10-CM | POA: Diagnosis not present

## 2018-11-21 DIAGNOSIS — Z4932 Encounter for adequacy testing for peritoneal dialysis: Secondary | ICD-10-CM | POA: Diagnosis not present

## 2018-11-21 DIAGNOSIS — Z79899 Other long term (current) drug therapy: Secondary | ICD-10-CM | POA: Diagnosis not present

## 2018-11-21 DIAGNOSIS — D509 Iron deficiency anemia, unspecified: Secondary | ICD-10-CM | POA: Diagnosis not present

## 2018-11-21 DIAGNOSIS — Z992 Dependence on renal dialysis: Secondary | ICD-10-CM | POA: Diagnosis not present

## 2018-11-21 DIAGNOSIS — R17 Unspecified jaundice: Secondary | ICD-10-CM | POA: Diagnosis not present

## 2018-11-21 DIAGNOSIS — I129 Hypertensive chronic kidney disease with stage 1 through stage 4 chronic kidney disease, or unspecified chronic kidney disease: Secondary | ICD-10-CM | POA: Diagnosis not present

## 2018-11-22 DIAGNOSIS — Z4932 Encounter for adequacy testing for peritoneal dialysis: Secondary | ICD-10-CM | POA: Diagnosis not present

## 2018-11-22 DIAGNOSIS — N2581 Secondary hyperparathyroidism of renal origin: Secondary | ICD-10-CM | POA: Diagnosis not present

## 2018-11-22 DIAGNOSIS — D631 Anemia in chronic kidney disease: Secondary | ICD-10-CM | POA: Diagnosis not present

## 2018-11-22 DIAGNOSIS — Z79899 Other long term (current) drug therapy: Secondary | ICD-10-CM | POA: Diagnosis not present

## 2018-11-22 DIAGNOSIS — N186 End stage renal disease: Secondary | ICD-10-CM | POA: Diagnosis not present

## 2018-11-22 DIAGNOSIS — R17 Unspecified jaundice: Secondary | ICD-10-CM | POA: Diagnosis not present

## 2018-11-23 DIAGNOSIS — D631 Anemia in chronic kidney disease: Secondary | ICD-10-CM | POA: Diagnosis not present

## 2018-11-23 DIAGNOSIS — N186 End stage renal disease: Secondary | ICD-10-CM | POA: Diagnosis not present

## 2018-11-23 DIAGNOSIS — Z79899 Other long term (current) drug therapy: Secondary | ICD-10-CM | POA: Diagnosis not present

## 2018-11-23 DIAGNOSIS — Z4932 Encounter for adequacy testing for peritoneal dialysis: Secondary | ICD-10-CM | POA: Diagnosis not present

## 2018-11-23 DIAGNOSIS — N2581 Secondary hyperparathyroidism of renal origin: Secondary | ICD-10-CM | POA: Diagnosis not present

## 2018-11-23 DIAGNOSIS — R17 Unspecified jaundice: Secondary | ICD-10-CM | POA: Diagnosis not present

## 2018-11-24 DIAGNOSIS — N2581 Secondary hyperparathyroidism of renal origin: Secondary | ICD-10-CM | POA: Diagnosis not present

## 2018-11-24 DIAGNOSIS — Z79899 Other long term (current) drug therapy: Secondary | ICD-10-CM | POA: Diagnosis not present

## 2018-11-24 DIAGNOSIS — Z4932 Encounter for adequacy testing for peritoneal dialysis: Secondary | ICD-10-CM | POA: Diagnosis not present

## 2018-11-24 DIAGNOSIS — D631 Anemia in chronic kidney disease: Secondary | ICD-10-CM | POA: Diagnosis not present

## 2018-11-24 DIAGNOSIS — R17 Unspecified jaundice: Secondary | ICD-10-CM | POA: Diagnosis not present

## 2018-11-24 DIAGNOSIS — N186 End stage renal disease: Secondary | ICD-10-CM | POA: Diagnosis not present

## 2018-11-25 DIAGNOSIS — N186 End stage renal disease: Secondary | ICD-10-CM | POA: Diagnosis not present

## 2018-11-25 DIAGNOSIS — N2581 Secondary hyperparathyroidism of renal origin: Secondary | ICD-10-CM | POA: Diagnosis not present

## 2018-11-25 DIAGNOSIS — Z79899 Other long term (current) drug therapy: Secondary | ICD-10-CM | POA: Diagnosis not present

## 2018-11-25 DIAGNOSIS — R17 Unspecified jaundice: Secondary | ICD-10-CM | POA: Diagnosis not present

## 2018-11-25 DIAGNOSIS — D631 Anemia in chronic kidney disease: Secondary | ICD-10-CM | POA: Diagnosis not present

## 2018-11-25 DIAGNOSIS — Z4932 Encounter for adequacy testing for peritoneal dialysis: Secondary | ICD-10-CM | POA: Diagnosis not present

## 2018-11-26 DIAGNOSIS — N2581 Secondary hyperparathyroidism of renal origin: Secondary | ICD-10-CM | POA: Diagnosis not present

## 2018-11-26 DIAGNOSIS — N186 End stage renal disease: Secondary | ICD-10-CM | POA: Diagnosis not present

## 2018-11-26 DIAGNOSIS — D631 Anemia in chronic kidney disease: Secondary | ICD-10-CM | POA: Diagnosis not present

## 2018-11-26 DIAGNOSIS — Z79899 Other long term (current) drug therapy: Secondary | ICD-10-CM | POA: Diagnosis not present

## 2018-11-26 DIAGNOSIS — R17 Unspecified jaundice: Secondary | ICD-10-CM | POA: Diagnosis not present

## 2018-11-26 DIAGNOSIS — Z4932 Encounter for adequacy testing for peritoneal dialysis: Secondary | ICD-10-CM | POA: Diagnosis not present

## 2018-11-27 DIAGNOSIS — R82994 Hypercalciuria: Secondary | ICD-10-CM | POA: Diagnosis not present

## 2018-11-27 DIAGNOSIS — R17 Unspecified jaundice: Secondary | ICD-10-CM | POA: Diagnosis not present

## 2018-11-27 DIAGNOSIS — Z4932 Encounter for adequacy testing for peritoneal dialysis: Secondary | ICD-10-CM | POA: Diagnosis not present

## 2018-11-27 DIAGNOSIS — D631 Anemia in chronic kidney disease: Secondary | ICD-10-CM | POA: Diagnosis not present

## 2018-11-27 DIAGNOSIS — N186 End stage renal disease: Secondary | ICD-10-CM | POA: Diagnosis not present

## 2018-11-27 DIAGNOSIS — Z79899 Other long term (current) drug therapy: Secondary | ICD-10-CM | POA: Diagnosis not present

## 2018-11-27 DIAGNOSIS — N2581 Secondary hyperparathyroidism of renal origin: Secondary | ICD-10-CM | POA: Diagnosis not present

## 2018-11-28 DIAGNOSIS — Z79899 Other long term (current) drug therapy: Secondary | ICD-10-CM | POA: Diagnosis not present

## 2018-11-28 DIAGNOSIS — Z4932 Encounter for adequacy testing for peritoneal dialysis: Secondary | ICD-10-CM | POA: Diagnosis not present

## 2018-11-28 DIAGNOSIS — N186 End stage renal disease: Secondary | ICD-10-CM | POA: Diagnosis not present

## 2018-11-28 DIAGNOSIS — R17 Unspecified jaundice: Secondary | ICD-10-CM | POA: Diagnosis not present

## 2018-11-28 DIAGNOSIS — N2581 Secondary hyperparathyroidism of renal origin: Secondary | ICD-10-CM | POA: Diagnosis not present

## 2018-11-28 DIAGNOSIS — D631 Anemia in chronic kidney disease: Secondary | ICD-10-CM | POA: Diagnosis not present

## 2018-11-29 DIAGNOSIS — Z79899 Other long term (current) drug therapy: Secondary | ICD-10-CM | POA: Diagnosis not present

## 2018-11-29 DIAGNOSIS — N2581 Secondary hyperparathyroidism of renal origin: Secondary | ICD-10-CM | POA: Diagnosis not present

## 2018-11-29 DIAGNOSIS — D631 Anemia in chronic kidney disease: Secondary | ICD-10-CM | POA: Diagnosis not present

## 2018-11-29 DIAGNOSIS — R17 Unspecified jaundice: Secondary | ICD-10-CM | POA: Diagnosis not present

## 2018-11-29 DIAGNOSIS — Z4932 Encounter for adequacy testing for peritoneal dialysis: Secondary | ICD-10-CM | POA: Diagnosis not present

## 2018-11-29 DIAGNOSIS — N186 End stage renal disease: Secondary | ICD-10-CM | POA: Diagnosis not present

## 2018-11-30 DIAGNOSIS — N186 End stage renal disease: Secondary | ICD-10-CM | POA: Diagnosis not present

## 2018-11-30 DIAGNOSIS — R17 Unspecified jaundice: Secondary | ICD-10-CM | POA: Diagnosis not present

## 2018-11-30 DIAGNOSIS — Z79899 Other long term (current) drug therapy: Secondary | ICD-10-CM | POA: Diagnosis not present

## 2018-11-30 DIAGNOSIS — N2581 Secondary hyperparathyroidism of renal origin: Secondary | ICD-10-CM | POA: Diagnosis not present

## 2018-11-30 DIAGNOSIS — D631 Anemia in chronic kidney disease: Secondary | ICD-10-CM | POA: Diagnosis not present

## 2018-11-30 DIAGNOSIS — Z4932 Encounter for adequacy testing for peritoneal dialysis: Secondary | ICD-10-CM | POA: Diagnosis not present

## 2018-12-01 DIAGNOSIS — D631 Anemia in chronic kidney disease: Secondary | ICD-10-CM | POA: Diagnosis not present

## 2018-12-01 DIAGNOSIS — Z79899 Other long term (current) drug therapy: Secondary | ICD-10-CM | POA: Diagnosis not present

## 2018-12-01 DIAGNOSIS — Z4932 Encounter for adequacy testing for peritoneal dialysis: Secondary | ICD-10-CM | POA: Diagnosis not present

## 2018-12-01 DIAGNOSIS — N186 End stage renal disease: Secondary | ICD-10-CM | POA: Diagnosis not present

## 2018-12-01 DIAGNOSIS — N2581 Secondary hyperparathyroidism of renal origin: Secondary | ICD-10-CM | POA: Diagnosis not present

## 2018-12-01 DIAGNOSIS — R17 Unspecified jaundice: Secondary | ICD-10-CM | POA: Diagnosis not present

## 2018-12-02 DIAGNOSIS — D631 Anemia in chronic kidney disease: Secondary | ICD-10-CM | POA: Diagnosis not present

## 2018-12-02 DIAGNOSIS — N186 End stage renal disease: Secondary | ICD-10-CM | POA: Diagnosis not present

## 2018-12-02 DIAGNOSIS — Z4932 Encounter for adequacy testing for peritoneal dialysis: Secondary | ICD-10-CM | POA: Diagnosis not present

## 2018-12-02 DIAGNOSIS — Z79899 Other long term (current) drug therapy: Secondary | ICD-10-CM | POA: Diagnosis not present

## 2018-12-02 DIAGNOSIS — R17 Unspecified jaundice: Secondary | ICD-10-CM | POA: Diagnosis not present

## 2018-12-02 DIAGNOSIS — N2581 Secondary hyperparathyroidism of renal origin: Secondary | ICD-10-CM | POA: Diagnosis not present

## 2018-12-03 DIAGNOSIS — N2581 Secondary hyperparathyroidism of renal origin: Secondary | ICD-10-CM | POA: Diagnosis not present

## 2018-12-03 DIAGNOSIS — D631 Anemia in chronic kidney disease: Secondary | ICD-10-CM | POA: Diagnosis not present

## 2018-12-03 DIAGNOSIS — R17 Unspecified jaundice: Secondary | ICD-10-CM | POA: Diagnosis not present

## 2018-12-03 DIAGNOSIS — N186 End stage renal disease: Secondary | ICD-10-CM | POA: Diagnosis not present

## 2018-12-03 DIAGNOSIS — Z79899 Other long term (current) drug therapy: Secondary | ICD-10-CM | POA: Diagnosis not present

## 2018-12-03 DIAGNOSIS — Z4932 Encounter for adequacy testing for peritoneal dialysis: Secondary | ICD-10-CM | POA: Diagnosis not present

## 2018-12-04 DIAGNOSIS — N186 End stage renal disease: Secondary | ICD-10-CM | POA: Diagnosis not present

## 2018-12-04 DIAGNOSIS — D631 Anemia in chronic kidney disease: Secondary | ICD-10-CM | POA: Diagnosis not present

## 2018-12-04 DIAGNOSIS — Z4932 Encounter for adequacy testing for peritoneal dialysis: Secondary | ICD-10-CM | POA: Diagnosis not present

## 2018-12-04 DIAGNOSIS — N2581 Secondary hyperparathyroidism of renal origin: Secondary | ICD-10-CM | POA: Diagnosis not present

## 2018-12-04 DIAGNOSIS — R17 Unspecified jaundice: Secondary | ICD-10-CM | POA: Diagnosis not present

## 2018-12-04 DIAGNOSIS — Z79899 Other long term (current) drug therapy: Secondary | ICD-10-CM | POA: Diagnosis not present

## 2018-12-05 DIAGNOSIS — D631 Anemia in chronic kidney disease: Secondary | ICD-10-CM | POA: Diagnosis not present

## 2018-12-05 DIAGNOSIS — N2581 Secondary hyperparathyroidism of renal origin: Secondary | ICD-10-CM | POA: Diagnosis not present

## 2018-12-05 DIAGNOSIS — R17 Unspecified jaundice: Secondary | ICD-10-CM | POA: Diagnosis not present

## 2018-12-05 DIAGNOSIS — Z79899 Other long term (current) drug therapy: Secondary | ICD-10-CM | POA: Diagnosis not present

## 2018-12-05 DIAGNOSIS — N186 End stage renal disease: Secondary | ICD-10-CM | POA: Diagnosis not present

## 2018-12-05 DIAGNOSIS — Z4932 Encounter for adequacy testing for peritoneal dialysis: Secondary | ICD-10-CM | POA: Diagnosis not present

## 2018-12-06 DIAGNOSIS — D631 Anemia in chronic kidney disease: Secondary | ICD-10-CM | POA: Diagnosis not present

## 2018-12-06 DIAGNOSIS — N2581 Secondary hyperparathyroidism of renal origin: Secondary | ICD-10-CM | POA: Diagnosis not present

## 2018-12-06 DIAGNOSIS — Z79899 Other long term (current) drug therapy: Secondary | ICD-10-CM | POA: Diagnosis not present

## 2018-12-06 DIAGNOSIS — R17 Unspecified jaundice: Secondary | ICD-10-CM | POA: Diagnosis not present

## 2018-12-06 DIAGNOSIS — Z4932 Encounter for adequacy testing for peritoneal dialysis: Secondary | ICD-10-CM | POA: Diagnosis not present

## 2018-12-06 DIAGNOSIS — N186 End stage renal disease: Secondary | ICD-10-CM | POA: Diagnosis not present

## 2018-12-07 DIAGNOSIS — D631 Anemia in chronic kidney disease: Secondary | ICD-10-CM | POA: Diagnosis not present

## 2018-12-07 DIAGNOSIS — Z4932 Encounter for adequacy testing for peritoneal dialysis: Secondary | ICD-10-CM | POA: Diagnosis not present

## 2018-12-07 DIAGNOSIS — Z79899 Other long term (current) drug therapy: Secondary | ICD-10-CM | POA: Diagnosis not present

## 2018-12-07 DIAGNOSIS — N2581 Secondary hyperparathyroidism of renal origin: Secondary | ICD-10-CM | POA: Diagnosis not present

## 2018-12-07 DIAGNOSIS — N186 End stage renal disease: Secondary | ICD-10-CM | POA: Diagnosis not present

## 2018-12-07 DIAGNOSIS — R17 Unspecified jaundice: Secondary | ICD-10-CM | POA: Diagnosis not present

## 2018-12-08 DIAGNOSIS — Z79899 Other long term (current) drug therapy: Secondary | ICD-10-CM | POA: Diagnosis not present

## 2018-12-08 DIAGNOSIS — R17 Unspecified jaundice: Secondary | ICD-10-CM | POA: Diagnosis not present

## 2018-12-08 DIAGNOSIS — N2581 Secondary hyperparathyroidism of renal origin: Secondary | ICD-10-CM | POA: Diagnosis not present

## 2018-12-08 DIAGNOSIS — Z4932 Encounter for adequacy testing for peritoneal dialysis: Secondary | ICD-10-CM | POA: Diagnosis not present

## 2018-12-08 DIAGNOSIS — N186 End stage renal disease: Secondary | ICD-10-CM | POA: Diagnosis not present

## 2018-12-08 DIAGNOSIS — D631 Anemia in chronic kidney disease: Secondary | ICD-10-CM | POA: Diagnosis not present

## 2018-12-09 DIAGNOSIS — D631 Anemia in chronic kidney disease: Secondary | ICD-10-CM | POA: Diagnosis not present

## 2018-12-09 DIAGNOSIS — Z4932 Encounter for adequacy testing for peritoneal dialysis: Secondary | ICD-10-CM | POA: Diagnosis not present

## 2018-12-09 DIAGNOSIS — N186 End stage renal disease: Secondary | ICD-10-CM | POA: Diagnosis not present

## 2018-12-09 DIAGNOSIS — R17 Unspecified jaundice: Secondary | ICD-10-CM | POA: Diagnosis not present

## 2018-12-09 DIAGNOSIS — N2581 Secondary hyperparathyroidism of renal origin: Secondary | ICD-10-CM | POA: Diagnosis not present

## 2018-12-09 DIAGNOSIS — Z79899 Other long term (current) drug therapy: Secondary | ICD-10-CM | POA: Diagnosis not present

## 2018-12-10 DIAGNOSIS — N2581 Secondary hyperparathyroidism of renal origin: Secondary | ICD-10-CM | POA: Diagnosis not present

## 2018-12-10 DIAGNOSIS — N186 End stage renal disease: Secondary | ICD-10-CM | POA: Diagnosis not present

## 2018-12-10 DIAGNOSIS — R17 Unspecified jaundice: Secondary | ICD-10-CM | POA: Diagnosis not present

## 2018-12-10 DIAGNOSIS — Z79899 Other long term (current) drug therapy: Secondary | ICD-10-CM | POA: Diagnosis not present

## 2018-12-10 DIAGNOSIS — D631 Anemia in chronic kidney disease: Secondary | ICD-10-CM | POA: Diagnosis not present

## 2018-12-10 DIAGNOSIS — Z4932 Encounter for adequacy testing for peritoneal dialysis: Secondary | ICD-10-CM | POA: Diagnosis not present

## 2018-12-11 ENCOUNTER — Encounter: Payer: Medicare Other | Attending: Family | Admitting: Dietician

## 2018-12-11 ENCOUNTER — Encounter: Payer: Self-pay | Admitting: Dietician

## 2018-12-11 DIAGNOSIS — N183 Chronic kidney disease, stage 3 unspecified: Secondary | ICD-10-CM

## 2018-12-11 DIAGNOSIS — Z4932 Encounter for adequacy testing for peritoneal dialysis: Secondary | ICD-10-CM | POA: Diagnosis not present

## 2018-12-11 DIAGNOSIS — D631 Anemia in chronic kidney disease: Secondary | ICD-10-CM | POA: Diagnosis not present

## 2018-12-11 DIAGNOSIS — N2581 Secondary hyperparathyroidism of renal origin: Secondary | ICD-10-CM | POA: Diagnosis not present

## 2018-12-11 DIAGNOSIS — N186 End stage renal disease: Secondary | ICD-10-CM | POA: Diagnosis not present

## 2018-12-11 DIAGNOSIS — Z79899 Other long term (current) drug therapy: Secondary | ICD-10-CM | POA: Diagnosis not present

## 2018-12-11 DIAGNOSIS — R17 Unspecified jaundice: Secondary | ICD-10-CM | POA: Diagnosis not present

## 2018-12-11 DIAGNOSIS — E119 Type 2 diabetes mellitus without complications: Secondary | ICD-10-CM

## 2018-12-11 DIAGNOSIS — Z794 Long term (current) use of insulin: Secondary | ICD-10-CM | POA: Insufficient documentation

## 2018-12-11 NOTE — Patient Instructions (Signed)
Low sodium frozen meal options  LUVO  Homemade (cook extra on your good days and freeze in meal size containers)  Label reading:  Overall, aim for no more than about 600 mg sodium per day  Look for low sodium and no sodium added fresh or Boars head meat for example.  Exercise options  PBS television  You tube video    Walking videos    Arm Chair videos    Yoga appropriate for your needs  Continue the arm exercises with the band  Continue to find ways to provide self care.

## 2018-12-11 NOTE — Progress Notes (Signed)
  Medical Nutrition Therapy:  Appt start time: 1050 end time:  1125. This visit was completed via telephone due to the COVID-19 pandemic.   I spoke with Maureen Barnes and verified that I was speaking with the correct person with two patient identifiers (full name and date of birth).   I discussed the limitations related to this kind of visit and the patient is willing to proceed.  She was the only one present.  She was at home and I was in my office.   Assessment:  Primary concerns today: Visit today is related to nutrition for type 2 diabetes and CKD along with other health concerns. History includes HTN, Hyperlipidemia, type 2 diabetes, and CKD on peritoneal dialysis.  She is on a kidney transplant list.  She also has arthritis and history of headaches. Sleep issues noted last visit (09/2018)- wakes for 2 hours in the middle of the night and sleeps for only 4-5 hours per night.  New labs unknown. She does not know her most recent A1C but reports that she checks her BG 3-4 times per day and this is 79-96 in the am and 105-120 after meals. Medications include:  Lantus 10 units q am.  She is no longer taking [place] or Metformin. UBW 161-164 lbs but 177 lbs in March, 2020. (was eating more to help her medication not hurt her stomach).  Patient's son lives with her.  She is doing her own shopping about every 2 weeks.  She reports increased stress due to COVID fears and is working through it.  She is not getting much exercise.  She has thought about buying a treadmill.  She continues to try to follow a low sugar, low salt, low phosphorous diet. 32 ounce fluid restriction. She thinks that her weight has increased some and reports that this is due to She is on disability.  She previously worked as a Quarry manager. Enjoys reading, designing and sewing clothes and jewelry, crafts and decorating.  Preferred Learning Style:   Auditory  Learning Readiness:   Ready  Change in progress   Estimated energy  needs: 1800-2000 calories 80-90 g protein  Progress Towards Goal(s):  In progress.   Nutritional Diagnosis:  NB-1.1 Food and nutrition-related knowledge deficit As related to diet for ESRD and CKD.  As evidenced by diet hx and patient report.    Intervention:  Nutrition counseling/education continued related to CKD, DM and other nutrition for HTN and HLD.  Discussed further other low sodium, convenience options.  Reviewed low sodium and label reading.  Discussed exercise options using you tube videos.  Plan: Low sodium frozen meal options  LUVO  Homemade (cook extra on your good days and freeze in meal size containers)  Label reading:  Overall, aim for no more than about 600 mg sodium per day  Look for low sodium and no sodium added fresh or Boars head meat for example.  Exercise options  PBS television  You tube video    Walking videos    Arm Chair videos    Yoga appropriate for your needs  Continue the arm exercises with the band  Continue to find ways to provide self care.   Teaching Method Utilized:  Auditory  Barriers to learning/adherence to lifestyle change: health  Demonstrated degree of understanding via:  Teach Back   Monitoring/Evaluation:  Dietary intake, exercise, label reading, and body weight in 5 week(s).

## 2018-12-12 DIAGNOSIS — N186 End stage renal disease: Secondary | ICD-10-CM | POA: Diagnosis not present

## 2018-12-12 DIAGNOSIS — R17 Unspecified jaundice: Secondary | ICD-10-CM | POA: Diagnosis not present

## 2018-12-12 DIAGNOSIS — Z79899 Other long term (current) drug therapy: Secondary | ICD-10-CM | POA: Diagnosis not present

## 2018-12-12 DIAGNOSIS — N2581 Secondary hyperparathyroidism of renal origin: Secondary | ICD-10-CM | POA: Diagnosis not present

## 2018-12-12 DIAGNOSIS — D631 Anemia in chronic kidney disease: Secondary | ICD-10-CM | POA: Diagnosis not present

## 2018-12-12 DIAGNOSIS — Z4932 Encounter for adequacy testing for peritoneal dialysis: Secondary | ICD-10-CM | POA: Diagnosis not present

## 2018-12-13 DIAGNOSIS — R17 Unspecified jaundice: Secondary | ICD-10-CM | POA: Diagnosis not present

## 2018-12-13 DIAGNOSIS — N2581 Secondary hyperparathyroidism of renal origin: Secondary | ICD-10-CM | POA: Diagnosis not present

## 2018-12-13 DIAGNOSIS — Z79899 Other long term (current) drug therapy: Secondary | ICD-10-CM | POA: Diagnosis not present

## 2018-12-13 DIAGNOSIS — N186 End stage renal disease: Secondary | ICD-10-CM | POA: Diagnosis not present

## 2018-12-13 DIAGNOSIS — Z4932 Encounter for adequacy testing for peritoneal dialysis: Secondary | ICD-10-CM | POA: Diagnosis not present

## 2018-12-13 DIAGNOSIS — D631 Anemia in chronic kidney disease: Secondary | ICD-10-CM | POA: Diagnosis not present

## 2018-12-14 DIAGNOSIS — R17 Unspecified jaundice: Secondary | ICD-10-CM | POA: Diagnosis not present

## 2018-12-14 DIAGNOSIS — D631 Anemia in chronic kidney disease: Secondary | ICD-10-CM | POA: Diagnosis not present

## 2018-12-14 DIAGNOSIS — Z79899 Other long term (current) drug therapy: Secondary | ICD-10-CM | POA: Diagnosis not present

## 2018-12-14 DIAGNOSIS — N186 End stage renal disease: Secondary | ICD-10-CM | POA: Diagnosis not present

## 2018-12-14 DIAGNOSIS — N2581 Secondary hyperparathyroidism of renal origin: Secondary | ICD-10-CM | POA: Diagnosis not present

## 2018-12-14 DIAGNOSIS — Z4932 Encounter for adequacy testing for peritoneal dialysis: Secondary | ICD-10-CM | POA: Diagnosis not present

## 2018-12-15 DIAGNOSIS — N186 End stage renal disease: Secondary | ICD-10-CM | POA: Diagnosis not present

## 2018-12-15 DIAGNOSIS — R17 Unspecified jaundice: Secondary | ICD-10-CM | POA: Diagnosis not present

## 2018-12-15 DIAGNOSIS — D631 Anemia in chronic kidney disease: Secondary | ICD-10-CM | POA: Diagnosis not present

## 2018-12-15 DIAGNOSIS — N2581 Secondary hyperparathyroidism of renal origin: Secondary | ICD-10-CM | POA: Diagnosis not present

## 2018-12-15 DIAGNOSIS — Z79899 Other long term (current) drug therapy: Secondary | ICD-10-CM | POA: Diagnosis not present

## 2018-12-15 DIAGNOSIS — Z4932 Encounter for adequacy testing for peritoneal dialysis: Secondary | ICD-10-CM | POA: Diagnosis not present

## 2018-12-16 DIAGNOSIS — D631 Anemia in chronic kidney disease: Secondary | ICD-10-CM | POA: Diagnosis not present

## 2018-12-16 DIAGNOSIS — Z79899 Other long term (current) drug therapy: Secondary | ICD-10-CM | POA: Diagnosis not present

## 2018-12-16 DIAGNOSIS — N2581 Secondary hyperparathyroidism of renal origin: Secondary | ICD-10-CM | POA: Diagnosis not present

## 2018-12-16 DIAGNOSIS — Z4932 Encounter for adequacy testing for peritoneal dialysis: Secondary | ICD-10-CM | POA: Diagnosis not present

## 2018-12-16 DIAGNOSIS — R17 Unspecified jaundice: Secondary | ICD-10-CM | POA: Diagnosis not present

## 2018-12-16 DIAGNOSIS — N186 End stage renal disease: Secondary | ICD-10-CM | POA: Diagnosis not present

## 2018-12-17 DIAGNOSIS — Z79899 Other long term (current) drug therapy: Secondary | ICD-10-CM | POA: Diagnosis not present

## 2018-12-17 DIAGNOSIS — Z4932 Encounter for adequacy testing for peritoneal dialysis: Secondary | ICD-10-CM | POA: Diagnosis not present

## 2018-12-17 DIAGNOSIS — D631 Anemia in chronic kidney disease: Secondary | ICD-10-CM | POA: Diagnosis not present

## 2018-12-17 DIAGNOSIS — N2581 Secondary hyperparathyroidism of renal origin: Secondary | ICD-10-CM | POA: Diagnosis not present

## 2018-12-17 DIAGNOSIS — R17 Unspecified jaundice: Secondary | ICD-10-CM | POA: Diagnosis not present

## 2018-12-17 DIAGNOSIS — N186 End stage renal disease: Secondary | ICD-10-CM | POA: Diagnosis not present

## 2018-12-18 DIAGNOSIS — Z4932 Encounter for adequacy testing for peritoneal dialysis: Secondary | ICD-10-CM | POA: Diagnosis not present

## 2018-12-18 DIAGNOSIS — R17 Unspecified jaundice: Secondary | ICD-10-CM | POA: Diagnosis not present

## 2018-12-18 DIAGNOSIS — D631 Anemia in chronic kidney disease: Secondary | ICD-10-CM | POA: Diagnosis not present

## 2018-12-18 DIAGNOSIS — N186 End stage renal disease: Secondary | ICD-10-CM | POA: Diagnosis not present

## 2018-12-18 DIAGNOSIS — Z79899 Other long term (current) drug therapy: Secondary | ICD-10-CM | POA: Diagnosis not present

## 2018-12-18 DIAGNOSIS — N2581 Secondary hyperparathyroidism of renal origin: Secondary | ICD-10-CM | POA: Diagnosis not present

## 2018-12-19 DIAGNOSIS — Z4932 Encounter for adequacy testing for peritoneal dialysis: Secondary | ICD-10-CM | POA: Diagnosis not present

## 2018-12-19 DIAGNOSIS — N2581 Secondary hyperparathyroidism of renal origin: Secondary | ICD-10-CM | POA: Diagnosis not present

## 2018-12-19 DIAGNOSIS — D631 Anemia in chronic kidney disease: Secondary | ICD-10-CM | POA: Diagnosis not present

## 2018-12-19 DIAGNOSIS — R17 Unspecified jaundice: Secondary | ICD-10-CM | POA: Diagnosis not present

## 2018-12-19 DIAGNOSIS — N186 End stage renal disease: Secondary | ICD-10-CM | POA: Diagnosis not present

## 2018-12-19 DIAGNOSIS — Z79899 Other long term (current) drug therapy: Secondary | ICD-10-CM | POA: Diagnosis not present

## 2018-12-20 DIAGNOSIS — Z4932 Encounter for adequacy testing for peritoneal dialysis: Secondary | ICD-10-CM | POA: Diagnosis not present

## 2018-12-20 DIAGNOSIS — Z79899 Other long term (current) drug therapy: Secondary | ICD-10-CM | POA: Diagnosis not present

## 2018-12-20 DIAGNOSIS — R17 Unspecified jaundice: Secondary | ICD-10-CM | POA: Diagnosis not present

## 2018-12-20 DIAGNOSIS — D631 Anemia in chronic kidney disease: Secondary | ICD-10-CM | POA: Diagnosis not present

## 2018-12-20 DIAGNOSIS — N186 End stage renal disease: Secondary | ICD-10-CM | POA: Diagnosis not present

## 2018-12-20 DIAGNOSIS — N2581 Secondary hyperparathyroidism of renal origin: Secondary | ICD-10-CM | POA: Diagnosis not present

## 2018-12-21 DIAGNOSIS — R17 Unspecified jaundice: Secondary | ICD-10-CM | POA: Diagnosis not present

## 2018-12-21 DIAGNOSIS — D631 Anemia in chronic kidney disease: Secondary | ICD-10-CM | POA: Diagnosis not present

## 2018-12-21 DIAGNOSIS — Z4932 Encounter for adequacy testing for peritoneal dialysis: Secondary | ICD-10-CM | POA: Diagnosis not present

## 2018-12-21 DIAGNOSIS — N2581 Secondary hyperparathyroidism of renal origin: Secondary | ICD-10-CM | POA: Diagnosis not present

## 2018-12-21 DIAGNOSIS — Z79899 Other long term (current) drug therapy: Secondary | ICD-10-CM | POA: Diagnosis not present

## 2018-12-21 DIAGNOSIS — N186 End stage renal disease: Secondary | ICD-10-CM | POA: Diagnosis not present

## 2018-12-22 DIAGNOSIS — R17 Unspecified jaundice: Secondary | ICD-10-CM | POA: Diagnosis not present

## 2018-12-22 DIAGNOSIS — N2581 Secondary hyperparathyroidism of renal origin: Secondary | ICD-10-CM | POA: Diagnosis not present

## 2018-12-22 DIAGNOSIS — I129 Hypertensive chronic kidney disease with stage 1 through stage 4 chronic kidney disease, or unspecified chronic kidney disease: Secondary | ICD-10-CM | POA: Diagnosis not present

## 2018-12-22 DIAGNOSIS — E440 Moderate protein-calorie malnutrition: Secondary | ICD-10-CM | POA: Diagnosis not present

## 2018-12-22 DIAGNOSIS — D631 Anemia in chronic kidney disease: Secondary | ICD-10-CM | POA: Diagnosis not present

## 2018-12-22 DIAGNOSIS — Z992 Dependence on renal dialysis: Secondary | ICD-10-CM | POA: Diagnosis not present

## 2018-12-22 DIAGNOSIS — Z79899 Other long term (current) drug therapy: Secondary | ICD-10-CM | POA: Diagnosis not present

## 2018-12-22 DIAGNOSIS — Z4932 Encounter for adequacy testing for peritoneal dialysis: Secondary | ICD-10-CM | POA: Diagnosis not present

## 2018-12-22 DIAGNOSIS — N186 End stage renal disease: Secondary | ICD-10-CM | POA: Diagnosis not present

## 2018-12-22 DIAGNOSIS — D509 Iron deficiency anemia, unspecified: Secondary | ICD-10-CM | POA: Diagnosis not present

## 2018-12-23 DIAGNOSIS — R17 Unspecified jaundice: Secondary | ICD-10-CM | POA: Diagnosis not present

## 2018-12-23 DIAGNOSIS — D631 Anemia in chronic kidney disease: Secondary | ICD-10-CM | POA: Diagnosis not present

## 2018-12-23 DIAGNOSIS — E440 Moderate protein-calorie malnutrition: Secondary | ICD-10-CM | POA: Diagnosis not present

## 2018-12-23 DIAGNOSIS — N2581 Secondary hyperparathyroidism of renal origin: Secondary | ICD-10-CM | POA: Diagnosis not present

## 2018-12-23 DIAGNOSIS — N186 End stage renal disease: Secondary | ICD-10-CM | POA: Diagnosis not present

## 2018-12-23 DIAGNOSIS — D509 Iron deficiency anemia, unspecified: Secondary | ICD-10-CM | POA: Diagnosis not present

## 2018-12-24 ENCOUNTER — Encounter: Payer: Self-pay | Admitting: General Surgery

## 2018-12-24 DIAGNOSIS — R82994 Hypercalciuria: Secondary | ICD-10-CM | POA: Diagnosis not present

## 2018-12-24 DIAGNOSIS — E440 Moderate protein-calorie malnutrition: Secondary | ICD-10-CM | POA: Diagnosis not present

## 2018-12-24 DIAGNOSIS — D631 Anemia in chronic kidney disease: Secondary | ICD-10-CM | POA: Diagnosis not present

## 2018-12-24 DIAGNOSIS — R17 Unspecified jaundice: Secondary | ICD-10-CM | POA: Diagnosis not present

## 2018-12-24 DIAGNOSIS — D509 Iron deficiency anemia, unspecified: Secondary | ICD-10-CM | POA: Diagnosis not present

## 2018-12-24 DIAGNOSIS — N186 End stage renal disease: Secondary | ICD-10-CM | POA: Diagnosis not present

## 2018-12-24 DIAGNOSIS — N2581 Secondary hyperparathyroidism of renal origin: Secondary | ICD-10-CM | POA: Diagnosis not present

## 2018-12-25 ENCOUNTER — Other Ambulatory Visit: Payer: Self-pay

## 2018-12-25 ENCOUNTER — Encounter: Payer: Self-pay | Admitting: Gastroenterology

## 2018-12-25 ENCOUNTER — Ambulatory Visit (INDEPENDENT_AMBULATORY_CARE_PROVIDER_SITE_OTHER): Payer: Medicare Other | Admitting: Gastroenterology

## 2018-12-25 VITALS — Ht 63.0 in | Wt 173.0 lb

## 2018-12-25 DIAGNOSIS — D631 Anemia in chronic kidney disease: Secondary | ICD-10-CM | POA: Diagnosis not present

## 2018-12-25 DIAGNOSIS — N186 End stage renal disease: Secondary | ICD-10-CM | POA: Diagnosis not present

## 2018-12-25 DIAGNOSIS — R6881 Early satiety: Secondary | ICD-10-CM

## 2018-12-25 DIAGNOSIS — N2581 Secondary hyperparathyroidism of renal origin: Secondary | ICD-10-CM | POA: Diagnosis not present

## 2018-12-25 DIAGNOSIS — R17 Unspecified jaundice: Secondary | ICD-10-CM | POA: Diagnosis not present

## 2018-12-25 DIAGNOSIS — R140 Abdominal distension (gaseous): Secondary | ICD-10-CM | POA: Diagnosis not present

## 2018-12-25 DIAGNOSIS — R12 Heartburn: Secondary | ICD-10-CM | POA: Diagnosis not present

## 2018-12-25 DIAGNOSIS — E440 Moderate protein-calorie malnutrition: Secondary | ICD-10-CM | POA: Diagnosis not present

## 2018-12-25 DIAGNOSIS — D509 Iron deficiency anemia, unspecified: Secondary | ICD-10-CM | POA: Diagnosis not present

## 2018-12-25 NOTE — Progress Notes (Signed)
This patient contacted our office requesting a physician telemedicine consultation regarding clinical questions and/or test results.   Participants on the conference : myself and patient   The patient consented to this consultation and was aware that a charge will be placed through their insurance.  I was in my office and the patient was at home   Encounter time:  Total time 32 minutes, with 27 minutes spent with patient on Doximity   _____________________________________________________________________________________________               [facility] Note  Chief Complaint: bloating  Subjective  History: Telemedicine new phone consult 10/15/2018 several months of nausea dyspepsia heartburn and headaches.  She has been on peritoneal dialysis for end-stage renal disease since October 19, and is on a transplant list. Has type 2 Prescribed Zofran for her - says she did not take it b/c has been feeling better.   Has abdominal bloating, flatulence, rumbling. No dysphagia, odynophagia Gets "hunger pain" in mid abdomen sometimes.  Saw nutritionist 5/21 and rec'd recommendations re: DM and ESRD dietary considerations (they report patient did not know her last Hgb A1C)   ROS: Cardiovascular:  no chest pain Respiratory: no dyspnea  The patient's Past Medical, Family and Social History were reviewed and are on file in the EMR.  Believes she had a screening colonoscopy about age 61 (another institution - no report available at this time)  Objective:  Med list reviewed  Current Outpatient Medications:  .  amLODipine (NORVASC) 10 MG tablet, Take 10 mg by mouth daily.  , Disp: , Rfl:  .  aspirin EC 81 MG tablet, Take 81 mg by mouth daily., Disp: , Rfl:  .  atorvastatin (LIPITOR) 40 MG tablet, Take 40 mg by mouth daily., Disp: , Rfl:  .  b complex-vitamin c-folic acid (NEPHRO-VITE) 0.8 MG TABS tablet, Take 1 tablet by mouth daily., Disp: , Rfl: 6 .  calcitRIOL  (ROCALTROL) 0.25 MCG capsule, Take 0.25 mcg by mouth daily. Take 2 caps daily, Disp: , Rfl:  .  carvedilol (COREG) 12.5 MG tablet, Take 12.5 mg by mouth 2 (two) times daily with a meal., Disp: , Rfl:  .  ciclopirox (PENLAC) 8 % solution, Apply topically at bedtime. Apply over nail and surrounding skin. Apply daily over previous coat. After seven (7) days, may remove with alcohol and continue cycle., Disp: 6.6 mL, Rfl: 0 .  cinacalcet (SENSIPAR) 30 MG tablet, TAKE 1 TABLET BY MOUTH EVERY OTHER DAY THREE DAYS A WEEK (3 TIMES A WEEK), Disp: , Rfl: 3 .  diphenhydrAMINE (BENADRYL) 25 MG tablet, Take 1 tablet (25 mg total) by mouth at bedtime as needed., Disp: 30 tablet, Rfl: 0 .  docusate sodium (COLACE) 100 MG capsule, Take 100 mg by mouth 2 (two) times daily., Disp: , Rfl:  .  famotidine (PEPCID) 10 MG tablet, Take 10 mg by mouth 2 (two) times daily., Disp: , Rfl:  .  ferrous sulfate 325 (65 FE) MG tablet, Take by mouth., Disp: , Rfl:  .  gentamicin cream (GARAMYCIN) 0.1 %, APPLY SMALL AMOUNT TO EXIT SITE DAILY, Disp: , Rfl: 3 .  HYDROcodone-acetaminophen (NORCO/VICODIN) 5-325 MG tablet, TAKE 1 TABLET BY MOUTH EVERY 4 HOURS AS NEEDED FOR UP TO 5 DAYS., Disp: , Rfl: 0 .  Insulin Glargine (LANTUS SOLOSTAR [place]), Inject 26 Units into the skin daily., Disp: , Rfl:  .  lactulose, encephalopathy, (CHRONULAC) 10 GM/15ML SOLN, Take 10 g by mouth., Disp: , Rfl:  .  loratadine (CLARITIN) 10 MG tablet, Take 10 mg by mouth as directed., Disp: , Rfl:  .  losartan-hydrochlorothiazide (HYZAAR) 100-25 MG tablet, Take 1 tablet by mouth daily., Disp: , Rfl:  .  metoprolol succinate (TOPROL-XL) 50 MG 24 hr tablet, Take 50 mg by mouth daily. Take with or immediately following a meal., Disp: , Rfl:  .  ondansetron (ZOFRAN) 4 MG tablet, Take 1 tablet (4 mg total) by mouth every 8 (eight) hours as needed for nausea or vomiting., Disp: 45 tablet, Rfl: 1 .  pantoprazole (PROTONIX) 40 MG tablet, Take 40 mg by mouth daily., Disp: ,  Rfl:  .  ranitidine (ZANTAC) 150 MG capsule, Take 150 mg by mouth every evening., Disp: , Rfl:  .  simethicone (MYLICON) 80 MG chewable tablet, Chew 80 mg by mouth every 6 (six) hours as needed for flatulence., Disp: , Rfl:  .  tiZANidine (ZANAFLEX) 2 MG tablet, Take by mouth., Disp: , Rfl:  Takes ducosate occasionally Uses generic miralax Not on lactulose No ranitidine, takes pepcid for heartburn    - no exam - virtual visit  Well-appearing, pleasant and conversational   She believes her last Hgb A1C was between 6-7  @ASSESSMENTPLANBEGIN @ Assessment: Encounter Diagnoses  Name Primary?  . Abdominal bloating Yes  . Early satiety   . Heartburn    Chronic nonspecific symptoms without red flag signs.  They seem largely related to chronic PD including some element of dysmotility, possibly maldigestion of foodstuffs causing bloating and flatulence.  MiraLAX side effect may be contributing, but she finds it helpful to relieve constipation that began when she started dialysis.  Breakthrough heartburn despite current therapy.  We discussed limitation of acid suppression medicines and the need for diet and lifestyle changes.  Not eating within several hours of bed and elevating head of bed really most important. Diabetic control sounds good.  Plan:  We will mail her some dietary advice regarding gas and bloating.  My office will arrange an in person visit with me since she is a new patient in the last few months with a follow-up today, and needs to be evaluated in person examination and determination if any other testing needed.  Nelida Meuse III

## 2018-12-25 NOTE — Patient Instructions (Signed)
Food Guidelines for gas and bloating  Many people have difficulty digesting certain foods, causing a variety of distressing and embarrassing symptoms such as abdominal pain, bloating and gas.  These foods may need to be avoided or consumed in small amounts.  Here are some tips that might be helpful for you.  1.   Lactose intolerance is the difficulty or complete inability to digest lactose, the natural sugar in milk and anything made from milk.  This condition is harmless, common, and can begin any time during life.  Some people can digest a modest amount of lactose while others cannot tolerate any.  Also, not all dairy products contain equal amounts of lactose.  For example, hard cheeses such as parmesan have less lactose than soft cheeses such as cheddar.  Yogurt has less lactose than milk or cheese.  Many packaged foods (even many brands of bread) have milk, so read ingredient lists carefully.  It is difficult to test for lactose intolerance, so just try avoiding lactose as much as possible for a week and see what happens with your symptoms.  If you seem to be lactose intolerant, the best plan is to avoid it (but make sure you get calcium from another source).  The next best thing is to use lactase enzyme supplements, available over the counter everywhere.  Just know that many lactose intolerant people need to take several tablets with each serving of dairy to avoid symptoms.  Lastly, a lot of restaurant food is made with milk or butter.  Many are things you might not suspect, such as mashed potatoes, rice and pasta (cooked with butter) and "grilled" items.  If you are lactose intolerant, it never hurts to ask your server what has milk or butter.  2.   Fiber is an important part of your diet, but not all fiber is well-tolerated.  Insoluble fiber such as bran is often consumed by normal gut bacteria and converted into gas.  Soluble fiber such as oats, squash, carrots and green beans are typically tolerated  better.  3.   Some types of carbohydrates can be poorly digested.  Examples include: fructose (apples, cherries, pears, raisins and other dried fruits), fructans (onions, zucchini, large amounts of wheat), sorbitol/mannitol/xylitol and sucralose/Splenda (common artificial sweeteners), and raffinose (lentils, broccoli, cabbage, asparagus, brussel sprouts, many types of beans).  Do a Development worker, community for [employer] and you will find helpful information. Beano, a dietary supplement, will often help with raffinose-containing foods.  As with lactase tablets, you may need several per serving.  4.   Whenever possible, avoid processed food&meats and chemical additives.  High fructose corn syrup, a common sweetener, may be difficult to digest.  Eggs and soy (comes from the soybean, and added to many foods now) are other common bloating/gassy foods.  - Dr. [facility]

## 2018-12-26 DIAGNOSIS — R17 Unspecified jaundice: Secondary | ICD-10-CM | POA: Diagnosis not present

## 2018-12-26 DIAGNOSIS — E440 Moderate protein-calorie malnutrition: Secondary | ICD-10-CM | POA: Diagnosis not present

## 2018-12-26 DIAGNOSIS — N2581 Secondary hyperparathyroidism of renal origin: Secondary | ICD-10-CM | POA: Diagnosis not present

## 2018-12-26 DIAGNOSIS — D509 Iron deficiency anemia, unspecified: Secondary | ICD-10-CM | POA: Diagnosis not present

## 2018-12-26 DIAGNOSIS — N186 End stage renal disease: Secondary | ICD-10-CM | POA: Diagnosis not present

## 2018-12-26 DIAGNOSIS — D631 Anemia in chronic kidney disease: Secondary | ICD-10-CM | POA: Diagnosis not present

## 2018-12-27 ENCOUNTER — Ambulatory Visit (HOSPITAL_COMMUNITY)
Admission: EM | Admit: 2018-12-27 | Discharge: 2018-12-27 | Disposition: A | Discharge status: Home / Self Care | Payer: Medicare Other | Attending: Family Medicine | Admitting: Family Medicine

## 2018-12-27 ENCOUNTER — Ambulatory Visit (INDEPENDENT_AMBULATORY_CARE_PROVIDER_SITE_OTHER): Payer: Medicare Other

## 2018-12-27 ENCOUNTER — Encounter (HOSPITAL_COMMUNITY): Payer: Self-pay

## 2018-12-27 ENCOUNTER — Other Ambulatory Visit: Payer: Self-pay

## 2018-12-27 DIAGNOSIS — S8002XA Contusion of left knee, initial encounter: Secondary | ICD-10-CM

## 2018-12-27 DIAGNOSIS — N186 End stage renal disease: Secondary | ICD-10-CM | POA: Diagnosis not present

## 2018-12-27 DIAGNOSIS — M25562 Pain in left knee: Secondary | ICD-10-CM | POA: Diagnosis not present

## 2018-12-27 DIAGNOSIS — R17 Unspecified jaundice: Secondary | ICD-10-CM | POA: Diagnosis not present

## 2018-12-27 DIAGNOSIS — E440 Moderate protein-calorie malnutrition: Secondary | ICD-10-CM | POA: Diagnosis not present

## 2018-12-27 DIAGNOSIS — N2581 Secondary hyperparathyroidism of renal origin: Secondary | ICD-10-CM | POA: Diagnosis not present

## 2018-12-27 DIAGNOSIS — W19XXXA Unspecified fall, initial encounter: Secondary | ICD-10-CM

## 2018-12-27 DIAGNOSIS — S8992XA Unspecified injury of left lower leg, initial encounter: Secondary | ICD-10-CM | POA: Diagnosis not present

## 2018-12-27 DIAGNOSIS — D631 Anemia in chronic kidney disease: Secondary | ICD-10-CM | POA: Diagnosis not present

## 2018-12-27 DIAGNOSIS — D509 Iron deficiency anemia, unspecified: Secondary | ICD-10-CM | POA: Diagnosis not present

## 2018-12-27 NOTE — ED Triage Notes (Signed)
Pt was shopping for some shoes and some how she slipped and fell in the store.left knee pain. This happened at 3 pm today. Pt states her right elbow is sore from the fal

## 2018-12-27 NOTE — ED Provider Notes (Addendum)
[place]    CSN: [id number] Arrival date & time: 12/27/18  1611     History   Chief Complaint Chief Complaint  Patient presents with  . Fall    HPI Maureen Barnes is a 58 y.o. female.   58 yo established patient at [facility], disabled woman from [place], [place]  Pt was shopping for some shoes and some how she slipped and fell in the store.left knee pain. This happened at 3 pm today. Pt states her right elbow is sore from the fall.  Patient was recently counseled on gas and bloating by GI physician (6/4)     Past Medical History:  Diagnosis Date  . Anemia   . Cervical disc disease   . Chronic kidney disease (CKD), stage V ([place])   . Diabetes mellitus   . Hep C w/o coma, chronic ([place])   . Hypertension   . Lumbar disc disease   . Peritoneal dialysis status ([place])   . Renal disorder     Patient Active Problem List   Diagnosis Date Noted  . Anxiety 07/17/2018  . ESRD on peritoneal dialysis ([place]) 07/17/2018  . H/O cesarean section 07/17/2018  . Evaluation by medical service required 04/02/2018  . Pre-transplant evaluation for end stage renal disease 11/18/2017  . Unilateral inguinal hernia 11/18/2017  . Hyperlipidemia 01/22/2017  . Palpitations 01/22/2017  . Dyspnea on exertion 01/22/2017  . CKD (chronic kidney disease) stage 3, GFR 30-59 ml/min (HCC) 10/18/2016  . Non-compliance 10/18/2016  . Chronic ethmoidal sinusitis 10/18/2016  . Hepatitis C, chronic ([place]) 05/15/2012  . Diabetes mellitus type 2, insulin dependent ([place]) 05/15/2012  . Hypertension 05/15/2012  . Anemia 05/15/2012    Past Surgical History:  Procedure Laterality Date  . TUBAL LIGATION      OB History    Gravida  3   Para  3   Term  3   Preterm      AB      Living  3     SAB      TAB      Ectopic      Multiple      Live Births               Home Medications    Prior to Admission medications   Medication Sig Start Date End Date Taking? Authorizing  Provider  amLODipine (NORVASC) 10 MG tablet Take 10 mg by mouth daily.      [provider]  aspirin EC 81 MG tablet Take 81 mg by mouth daily.    [provider]  atorvastatin (LIPITOR) 40 MG tablet Take 40 mg by mouth daily.    [provider]  b complex-vitamin c-folic acid (NEPHRO-VITE) 0.8 MG TABS tablet Take 1 tablet by mouth daily. 12/06/17   [provider]  calcitRIOL (ROCALTROL) 0.25 MCG capsule Take 0.25 mcg by mouth daily. Take 2 caps daily    [provider]  carvedilol (COREG) 12.5 MG tablet Take 12.5 mg by mouth 2 (two) times daily with a meal.    [provider]  ciclopirox (PENLAC) 8 % solution Apply topically at bedtime. Apply over nail and surrounding skin. Apply daily over previous coat. After seven (7) days, may remove with alcohol and continue cycle. 07/17/18   Evelina Bucy, DPM  cinacalcet (SENSIPAR) 30 MG tablet TAKE 1 TABLET BY MOUTH EVERY OTHER DAY THREE DAYS A WEEK (3 TIMES A WEEK) 04/22/18   [provider]  diphenhydrAMINE (BENADRYL) 25 MG tablet Take 1 tablet (25 mg total) by mouth at bedtime as needed. 11/12/16   Clent Demark, PA-C  docusate sodium (COLACE) 100 MG capsule Take 100 mg by mouth 2 (two) times daily.    [provider]  famotidine (PEPCID) 10 MG tablet Take 10 mg by mouth 2 (two) times daily.    [provider]  ferrous sulfate 325 (65 FE) MG tablet Take by mouth.    [provider]  gentamicin cream (GARAMYCIN) 0.1 % APPLY SMALL AMOUNT TO EXIT SITE DAILY 01/10/18   [provider]  HYDROcodone-acetaminophen (NORCO/VICODIN) 5-325 MG tablet TAKE 1 TABLET BY MOUTH EVERY 4 HOURS AS NEEDED FOR UP TO 5 DAYS. 04/17/18   [provider]  Insulin Glargine (LANTUS SOLOSTAR [place]) Inject 26 Units into the skin daily.    [provider]  lactulose, encephalopathy, (CHRONULAC) 10 GM/15ML SOLN Take 10 g by mouth.    [provider]  loratadine  (CLARITIN) 10 MG tablet Take 10 mg by mouth as directed.    [provider]  losartan-hydrochlorothiazide (HYZAAR) 100-25 MG tablet Take 1 tablet by mouth daily.    [provider]  metoprolol succinate (TOPROL-XL) 50 MG 24 hr tablet Take 50 mg by mouth daily. Take with or immediately following a meal.    [provider]  ondansetron (ZOFRAN) 4 MG tablet Take 1 tablet (4 mg total) by mouth every 8 (eight) hours as needed for nausea or vomiting. 10/15/18   Doran Stabler, MD  pantoprazole (PROTONIX) 40 MG tablet Take 40 mg by mouth daily.    [provider]  ranitidine (ZANTAC) 150 MG capsule Take 150 mg by mouth every evening.    [provider]  simethicone (MYLICON) 80 MG chewable tablet Chew 80 mg by mouth every 6 (six) hours as needed for flatulence.    [provider]  tiZANidine (ZANAFLEX) 2 MG tablet Take by mouth.    [provider]    Family History Family History  Problem Relation Age of Onset  . Hypertension Mother   . Cancer Mother   . Breast cancer Mother   . Diabetes Father   . Hypertension Father     Social History Social History   Tobacco Use  . Smoking status: Never Smoker  . Smokeless tobacco: Never Used  Substance Use Topics  . Alcohol use: No  . Drug use: No     Allergies   Peanut oil; Shellfish-derived products; Iodine; Peanut-containing drug products; and Shellfish allergy   Review of Systems Review of Systems   Physical Exam Triage Vital Signs ED Triage Vitals  Enc Vitals Group     BP 12/27/18 1632 131/72     Pulse Rate 12/27/18 1632 78     Resp 12/27/18 1632 16     Temp 12/27/18 1632 98.9 F (37.2 C)     Temp Source 12/27/18 1632 Oral     SpO2 12/27/18 1632 97 %     Weight 12/27/18 1629 172 lb (78 kg)     Height --      Head Circumference --      Peak Flow --      Pain Score 12/27/18 1629 6     Pain Loc --      Pain Edu? --      Excl. in [place]? --    No data found.   Updated Vital Signs BP 131/72 (BP Location: Right Arm)   Pulse  78   Temp 98.9 F (37.2 C) (Oral)   Resp 16   Wt 78 kg   LMP 07/10/2011   SpO2 97%   BMI 30.47 kg/m    Physical Exam Vitals signs and nursing note reviewed.  Constitutional:      Appearance: Normal appearance.  HENT:     Head: Normocephalic.     Right Ear: External ear normal.     Left Ear: External ear normal.     Nose: Nose normal.     Mouth/Throat:     Pharynx: Oropharynx is clear.  Eyes:     Conjunctiva/sclera: Conjunctivae normal.  Neck:     Musculoskeletal: Normal range of motion and neck supple.  Cardiovascular:     Rate and Rhythm: Normal rate.  Pulmonary:     Effort: Pulmonary effort is normal.     Breath sounds: Normal breath sounds.  Musculoskeletal:     Comments: Right elbow:  Normal inspection, ROM, skin, palpation  Left knee:  Tender just below patella with mild STS, no ecchymosis, intact ligaments and normal ROM  Skin:    General: Skin is warm and dry.  Neurological:     General: No focal deficit present.     Mental Status: She is alert.     Gait: Gait abnormal.  Psychiatric:        Mood and Affect: Mood normal.        UC Treatments / Results  Labs (all labs ordered are listed, but only abnormal results are displayed) Labs Reviewed - No data to display  EKG None  Radiology Dg Knee Complete 4 Views Left  Result Date: 12/27/2018 CLINICAL DATA:  Fall and struck anterior knee.  Left knee pain. EXAM: LEFT KNEE - COMPLETE 4+ VIEW COMPARISON:  None. FINDINGS: Negative for fracture, dislocation or large joint effusion. Enthesopathic changes along the superior aspect of the patella. Normal alignment. No significant joint space narrowing or degenerative disease. Mild soft tissue swelling along the anterior aspect of the knee. IMPRESSION: No acute bone abnormality to left knee. Electronically Signed   By: Markus Daft M.D.   On: 12/27/2018 17:58    Procedures Procedures (including  critical care time)  Medications Ordered in UC Medications - No data to display  Initial Impression / Assessment and Plan / UC Course  I have reviewed the triage vital signs and the nursing notes.  Pertinent labs & imaging results that were available during my care of the patient were reviewed by me and considered in my medical decision making (see chart for details).    Final Clinical Impressions(s) / UC Diagnoses   Final diagnoses:  Contusion of left knee, initial encounter  Fall, initial encounter     Discharge Instructions     Your x-rays are normal.  At this point I would suggest use ibuprofen and ice the knee for the next 24 hours.  The Ace wrap should help control the swelling.    ED Prescriptions    None     Controlled Substance Prescriptions [place] Controlled Substance Registry consulted? Not Applicable   Robyn Haber, MD 12/27/18 Joan Mayans    Robyn Haber, MD 12/27/18 [phone]

## 2018-12-27 NOTE — Discharge Instructions (Addendum)
Your x-rays are normal.  At this point I would suggest use ibuprofen and ice the knee for the next 24 hours.  The Ace wrap should help control the swelling.

## 2018-12-27 NOTE — ED Notes (Signed)
Ace wrap applied by provider

## 2018-12-28 DIAGNOSIS — N2581 Secondary hyperparathyroidism of renal origin: Secondary | ICD-10-CM | POA: Diagnosis not present

## 2018-12-28 DIAGNOSIS — N186 End stage renal disease: Secondary | ICD-10-CM | POA: Diagnosis not present

## 2018-12-28 DIAGNOSIS — D631 Anemia in chronic kidney disease: Secondary | ICD-10-CM | POA: Diagnosis not present

## 2018-12-28 DIAGNOSIS — E440 Moderate protein-calorie malnutrition: Secondary | ICD-10-CM | POA: Diagnosis not present

## 2018-12-28 DIAGNOSIS — R17 Unspecified jaundice: Secondary | ICD-10-CM | POA: Diagnosis not present

## 2018-12-28 DIAGNOSIS — D509 Iron deficiency anemia, unspecified: Secondary | ICD-10-CM | POA: Diagnosis not present

## 2018-12-29 DIAGNOSIS — N2581 Secondary hyperparathyroidism of renal origin: Secondary | ICD-10-CM | POA: Diagnosis not present

## 2018-12-29 DIAGNOSIS — R17 Unspecified jaundice: Secondary | ICD-10-CM | POA: Diagnosis not present

## 2018-12-29 DIAGNOSIS — N186 End stage renal disease: Secondary | ICD-10-CM | POA: Diagnosis not present

## 2018-12-29 DIAGNOSIS — E440 Moderate protein-calorie malnutrition: Secondary | ICD-10-CM | POA: Diagnosis not present

## 2018-12-29 DIAGNOSIS — D631 Anemia in chronic kidney disease: Secondary | ICD-10-CM | POA: Diagnosis not present

## 2018-12-29 DIAGNOSIS — D509 Iron deficiency anemia, unspecified: Secondary | ICD-10-CM | POA: Diagnosis not present

## 2018-12-30 DIAGNOSIS — D509 Iron deficiency anemia, unspecified: Secondary | ICD-10-CM | POA: Diagnosis not present

## 2018-12-30 DIAGNOSIS — E440 Moderate protein-calorie malnutrition: Secondary | ICD-10-CM | POA: Diagnosis not present

## 2018-12-30 DIAGNOSIS — D631 Anemia in chronic kidney disease: Secondary | ICD-10-CM | POA: Diagnosis not present

## 2018-12-30 DIAGNOSIS — R17 Unspecified jaundice: Secondary | ICD-10-CM | POA: Diagnosis not present

## 2018-12-30 DIAGNOSIS — N186 End stage renal disease: Secondary | ICD-10-CM | POA: Diagnosis not present

## 2018-12-30 DIAGNOSIS — N2581 Secondary hyperparathyroidism of renal origin: Secondary | ICD-10-CM | POA: Diagnosis not present

## 2018-12-31 DIAGNOSIS — D631 Anemia in chronic kidney disease: Secondary | ICD-10-CM | POA: Diagnosis not present

## 2018-12-31 DIAGNOSIS — N186 End stage renal disease: Secondary | ICD-10-CM | POA: Diagnosis not present

## 2018-12-31 DIAGNOSIS — D509 Iron deficiency anemia, unspecified: Secondary | ICD-10-CM | POA: Diagnosis not present

## 2018-12-31 DIAGNOSIS — E440 Moderate protein-calorie malnutrition: Secondary | ICD-10-CM | POA: Diagnosis not present

## 2018-12-31 DIAGNOSIS — R17 Unspecified jaundice: Secondary | ICD-10-CM | POA: Diagnosis not present

## 2018-12-31 DIAGNOSIS — N2581 Secondary hyperparathyroidism of renal origin: Secondary | ICD-10-CM | POA: Diagnosis not present

## 2019-01-01 DIAGNOSIS — N186 End stage renal disease: Secondary | ICD-10-CM | POA: Diagnosis not present

## 2019-01-01 DIAGNOSIS — R17 Unspecified jaundice: Secondary | ICD-10-CM | POA: Diagnosis not present

## 2019-01-01 DIAGNOSIS — E440 Moderate protein-calorie malnutrition: Secondary | ICD-10-CM | POA: Diagnosis not present

## 2019-01-01 DIAGNOSIS — N2581 Secondary hyperparathyroidism of renal origin: Secondary | ICD-10-CM | POA: Diagnosis not present

## 2019-01-01 DIAGNOSIS — D509 Iron deficiency anemia, unspecified: Secondary | ICD-10-CM | POA: Diagnosis not present

## 2019-01-01 DIAGNOSIS — D631 Anemia in chronic kidney disease: Secondary | ICD-10-CM | POA: Diagnosis not present

## 2019-01-02 DIAGNOSIS — D631 Anemia in chronic kidney disease: Secondary | ICD-10-CM | POA: Diagnosis not present

## 2019-01-02 DIAGNOSIS — E440 Moderate protein-calorie malnutrition: Secondary | ICD-10-CM | POA: Diagnosis not present

## 2019-01-02 DIAGNOSIS — R17 Unspecified jaundice: Secondary | ICD-10-CM | POA: Diagnosis not present

## 2019-01-02 DIAGNOSIS — N186 End stage renal disease: Secondary | ICD-10-CM | POA: Diagnosis not present

## 2019-01-02 DIAGNOSIS — N2581 Secondary hyperparathyroidism of renal origin: Secondary | ICD-10-CM | POA: Diagnosis not present

## 2019-01-02 DIAGNOSIS — D509 Iron deficiency anemia, unspecified: Secondary | ICD-10-CM | POA: Diagnosis not present

## 2019-01-03 DIAGNOSIS — E440 Moderate protein-calorie malnutrition: Secondary | ICD-10-CM | POA: Diagnosis not present

## 2019-01-03 DIAGNOSIS — D631 Anemia in chronic kidney disease: Secondary | ICD-10-CM | POA: Diagnosis not present

## 2019-01-03 DIAGNOSIS — N186 End stage renal disease: Secondary | ICD-10-CM | POA: Diagnosis not present

## 2019-01-03 DIAGNOSIS — D509 Iron deficiency anemia, unspecified: Secondary | ICD-10-CM | POA: Diagnosis not present

## 2019-01-03 DIAGNOSIS — N2581 Secondary hyperparathyroidism of renal origin: Secondary | ICD-10-CM | POA: Diagnosis not present

## 2019-01-03 DIAGNOSIS — R17 Unspecified jaundice: Secondary | ICD-10-CM | POA: Diagnosis not present

## 2019-01-04 DIAGNOSIS — N2581 Secondary hyperparathyroidism of renal origin: Secondary | ICD-10-CM | POA: Diagnosis not present

## 2019-01-04 DIAGNOSIS — D631 Anemia in chronic kidney disease: Secondary | ICD-10-CM | POA: Diagnosis not present

## 2019-01-04 DIAGNOSIS — N186 End stage renal disease: Secondary | ICD-10-CM | POA: Diagnosis not present

## 2019-01-04 DIAGNOSIS — I132 Hypertensive heart and chronic kidney disease with heart failure and with stage 5 chronic kidney disease, or end stage renal disease: Secondary | ICD-10-CM | POA: Diagnosis not present

## 2019-01-04 DIAGNOSIS — R17 Unspecified jaundice: Secondary | ICD-10-CM | POA: Diagnosis not present

## 2019-01-04 DIAGNOSIS — E440 Moderate protein-calorie malnutrition: Secondary | ICD-10-CM | POA: Diagnosis not present

## 2019-01-04 DIAGNOSIS — D509 Iron deficiency anemia, unspecified: Secondary | ICD-10-CM | POA: Diagnosis not present

## 2019-01-05 DIAGNOSIS — N2581 Secondary hyperparathyroidism of renal origin: Secondary | ICD-10-CM | POA: Diagnosis not present

## 2019-01-05 DIAGNOSIS — D631 Anemia in chronic kidney disease: Secondary | ICD-10-CM | POA: Diagnosis not present

## 2019-01-05 DIAGNOSIS — D509 Iron deficiency anemia, unspecified: Secondary | ICD-10-CM | POA: Diagnosis not present

## 2019-01-05 DIAGNOSIS — N186 End stage renal disease: Secondary | ICD-10-CM | POA: Diagnosis not present

## 2019-01-05 DIAGNOSIS — R17 Unspecified jaundice: Secondary | ICD-10-CM | POA: Diagnosis not present

## 2019-01-05 DIAGNOSIS — E440 Moderate protein-calorie malnutrition: Secondary | ICD-10-CM | POA: Diagnosis not present

## 2019-01-06 DIAGNOSIS — M25561 Pain in right knee: Secondary | ICD-10-CM | POA: Diagnosis not present

## 2019-01-06 DIAGNOSIS — M25562 Pain in left knee: Secondary | ICD-10-CM | POA: Diagnosis not present

## 2019-01-06 DIAGNOSIS — M222X1 Patellofemoral disorders, right knee: Secondary | ICD-10-CM | POA: Diagnosis not present

## 2019-01-06 DIAGNOSIS — E440 Moderate protein-calorie malnutrition: Secondary | ICD-10-CM | POA: Diagnosis not present

## 2019-01-06 DIAGNOSIS — D631 Anemia in chronic kidney disease: Secondary | ICD-10-CM | POA: Diagnosis not present

## 2019-01-06 DIAGNOSIS — D509 Iron deficiency anemia, unspecified: Secondary | ICD-10-CM | POA: Diagnosis not present

## 2019-01-06 DIAGNOSIS — R17 Unspecified jaundice: Secondary | ICD-10-CM | POA: Diagnosis not present

## 2019-01-06 DIAGNOSIS — N2581 Secondary hyperparathyroidism of renal origin: Secondary | ICD-10-CM | POA: Diagnosis not present

## 2019-01-06 DIAGNOSIS — N186 End stage renal disease: Secondary | ICD-10-CM | POA: Diagnosis not present

## 2019-01-07 DIAGNOSIS — N2581 Secondary hyperparathyroidism of renal origin: Secondary | ICD-10-CM | POA: Diagnosis not present

## 2019-01-07 DIAGNOSIS — D509 Iron deficiency anemia, unspecified: Secondary | ICD-10-CM | POA: Diagnosis not present

## 2019-01-07 DIAGNOSIS — D631 Anemia in chronic kidney disease: Secondary | ICD-10-CM | POA: Diagnosis not present

## 2019-01-07 DIAGNOSIS — E440 Moderate protein-calorie malnutrition: Secondary | ICD-10-CM | POA: Diagnosis not present

## 2019-01-07 DIAGNOSIS — R17 Unspecified jaundice: Secondary | ICD-10-CM | POA: Diagnosis not present

## 2019-01-07 DIAGNOSIS — N186 End stage renal disease: Secondary | ICD-10-CM | POA: Diagnosis not present

## 2019-01-08 DIAGNOSIS — N2581 Secondary hyperparathyroidism of renal origin: Secondary | ICD-10-CM | POA: Diagnosis not present

## 2019-01-08 DIAGNOSIS — D631 Anemia in chronic kidney disease: Secondary | ICD-10-CM | POA: Diagnosis not present

## 2019-01-08 DIAGNOSIS — E440 Moderate protein-calorie malnutrition: Secondary | ICD-10-CM | POA: Diagnosis not present

## 2019-01-08 DIAGNOSIS — R17 Unspecified jaundice: Secondary | ICD-10-CM | POA: Diagnosis not present

## 2019-01-08 DIAGNOSIS — D509 Iron deficiency anemia, unspecified: Secondary | ICD-10-CM | POA: Diagnosis not present

## 2019-01-08 DIAGNOSIS — N186 End stage renal disease: Secondary | ICD-10-CM | POA: Diagnosis not present

## 2019-01-09 DIAGNOSIS — E440 Moderate protein-calorie malnutrition: Secondary | ICD-10-CM | POA: Diagnosis not present

## 2019-01-09 DIAGNOSIS — N186 End stage renal disease: Secondary | ICD-10-CM | POA: Diagnosis not present

## 2019-01-09 DIAGNOSIS — N2581 Secondary hyperparathyroidism of renal origin: Secondary | ICD-10-CM | POA: Diagnosis not present

## 2019-01-09 DIAGNOSIS — D631 Anemia in chronic kidney disease: Secondary | ICD-10-CM | POA: Diagnosis not present

## 2019-01-09 DIAGNOSIS — D509 Iron deficiency anemia, unspecified: Secondary | ICD-10-CM | POA: Diagnosis not present

## 2019-01-09 DIAGNOSIS — R17 Unspecified jaundice: Secondary | ICD-10-CM | POA: Diagnosis not present

## 2019-01-10 DIAGNOSIS — D509 Iron deficiency anemia, unspecified: Secondary | ICD-10-CM | POA: Diagnosis not present

## 2019-01-10 DIAGNOSIS — E440 Moderate protein-calorie malnutrition: Secondary | ICD-10-CM | POA: Diagnosis not present

## 2019-01-10 DIAGNOSIS — R17 Unspecified jaundice: Secondary | ICD-10-CM | POA: Diagnosis not present

## 2019-01-10 DIAGNOSIS — D631 Anemia in chronic kidney disease: Secondary | ICD-10-CM | POA: Diagnosis not present

## 2019-01-10 DIAGNOSIS — N2581 Secondary hyperparathyroidism of renal origin: Secondary | ICD-10-CM | POA: Diagnosis not present

## 2019-01-10 DIAGNOSIS — N186 End stage renal disease: Secondary | ICD-10-CM | POA: Diagnosis not present

## 2019-01-11 DIAGNOSIS — E440 Moderate protein-calorie malnutrition: Secondary | ICD-10-CM | POA: Diagnosis not present

## 2019-01-11 DIAGNOSIS — R17 Unspecified jaundice: Secondary | ICD-10-CM | POA: Diagnosis not present

## 2019-01-11 DIAGNOSIS — N2581 Secondary hyperparathyroidism of renal origin: Secondary | ICD-10-CM | POA: Diagnosis not present

## 2019-01-11 DIAGNOSIS — D631 Anemia in chronic kidney disease: Secondary | ICD-10-CM | POA: Diagnosis not present

## 2019-01-11 DIAGNOSIS — D509 Iron deficiency anemia, unspecified: Secondary | ICD-10-CM | POA: Diagnosis not present

## 2019-01-11 DIAGNOSIS — N186 End stage renal disease: Secondary | ICD-10-CM | POA: Diagnosis not present

## 2019-01-12 DIAGNOSIS — K219 Gastro-esophageal reflux disease without esophagitis: Secondary | ICD-10-CM | POA: Diagnosis not present

## 2019-01-12 DIAGNOSIS — N186 End stage renal disease: Secondary | ICD-10-CM | POA: Diagnosis not present

## 2019-01-12 DIAGNOSIS — E440 Moderate protein-calorie malnutrition: Secondary | ICD-10-CM | POA: Diagnosis not present

## 2019-01-12 DIAGNOSIS — D631 Anemia in chronic kidney disease: Secondary | ICD-10-CM | POA: Diagnosis not present

## 2019-01-12 DIAGNOSIS — N2581 Secondary hyperparathyroidism of renal origin: Secondary | ICD-10-CM | POA: Diagnosis not present

## 2019-01-12 DIAGNOSIS — D509 Iron deficiency anemia, unspecified: Secondary | ICD-10-CM | POA: Diagnosis not present

## 2019-01-12 DIAGNOSIS — R17 Unspecified jaundice: Secondary | ICD-10-CM | POA: Diagnosis not present

## 2019-01-13 DIAGNOSIS — D631 Anemia in chronic kidney disease: Secondary | ICD-10-CM | POA: Diagnosis not present

## 2019-01-13 DIAGNOSIS — E440 Moderate protein-calorie malnutrition: Secondary | ICD-10-CM | POA: Diagnosis not present

## 2019-01-13 DIAGNOSIS — D509 Iron deficiency anemia, unspecified: Secondary | ICD-10-CM | POA: Diagnosis not present

## 2019-01-13 DIAGNOSIS — N2581 Secondary hyperparathyroidism of renal origin: Secondary | ICD-10-CM | POA: Diagnosis not present

## 2019-01-13 DIAGNOSIS — R17 Unspecified jaundice: Secondary | ICD-10-CM | POA: Diagnosis not present

## 2019-01-13 DIAGNOSIS — N186 End stage renal disease: Secondary | ICD-10-CM | POA: Diagnosis not present

## 2019-01-14 DIAGNOSIS — E440 Moderate protein-calorie malnutrition: Secondary | ICD-10-CM | POA: Diagnosis not present

## 2019-01-14 DIAGNOSIS — D509 Iron deficiency anemia, unspecified: Secondary | ICD-10-CM | POA: Diagnosis not present

## 2019-01-14 DIAGNOSIS — N2581 Secondary hyperparathyroidism of renal origin: Secondary | ICD-10-CM | POA: Diagnosis not present

## 2019-01-14 DIAGNOSIS — D631 Anemia in chronic kidney disease: Secondary | ICD-10-CM | POA: Diagnosis not present

## 2019-01-14 DIAGNOSIS — N186 End stage renal disease: Secondary | ICD-10-CM | POA: Diagnosis not present

## 2019-01-14 DIAGNOSIS — R17 Unspecified jaundice: Secondary | ICD-10-CM | POA: Diagnosis not present

## 2019-01-15 ENCOUNTER — Encounter: Payer: Self-pay | Admitting: Dietician

## 2019-01-15 ENCOUNTER — Encounter: Payer: Medicare Other | Attending: Family | Admitting: Dietician

## 2019-01-15 DIAGNOSIS — E119 Type 2 diabetes mellitus without complications: Secondary | ICD-10-CM | POA: Diagnosis not present

## 2019-01-15 DIAGNOSIS — N183 Chronic kidney disease, stage 3 (moderate): Secondary | ICD-10-CM | POA: Diagnosis not present

## 2019-01-15 DIAGNOSIS — E440 Moderate protein-calorie malnutrition: Secondary | ICD-10-CM | POA: Diagnosis not present

## 2019-01-15 DIAGNOSIS — Z794 Long term (current) use of insulin: Secondary | ICD-10-CM | POA: Insufficient documentation

## 2019-01-15 DIAGNOSIS — D631 Anemia in chronic kidney disease: Secondary | ICD-10-CM | POA: Diagnosis not present

## 2019-01-15 DIAGNOSIS — N186 End stage renal disease: Secondary | ICD-10-CM | POA: Diagnosis not present

## 2019-01-15 DIAGNOSIS — D509 Iron deficiency anemia, unspecified: Secondary | ICD-10-CM | POA: Diagnosis not present

## 2019-01-15 DIAGNOSIS — R17 Unspecified jaundice: Secondary | ICD-10-CM | POA: Diagnosis not present

## 2019-01-15 DIAGNOSIS — N2581 Secondary hyperparathyroidism of renal origin: Secondary | ICD-10-CM | POA: Diagnosis not present

## 2019-01-15 NOTE — Progress Notes (Signed)
Medical Nutrition Therapy:  Appt start time: 4970 end time:  2637. This visit was completed via telephone due to the COVID-19 pandemic.   I spoke with Maureen Barnes and verified that I was speaking with the correct person with two patient identifiers (full name and date of birth).   I discussed the limitations related to this kind of visit and the patient is willing to proceed.  She was the only one present.  She was at home and I was in my office.   Assessment:  Primary concerns today: Visit today is related to f/u for type 2 diabetes and CKD along with other health concerns. History includes HTN, Hyperlipidemia, type 2 diabetes, and CKD on peritoneal dialysis.  She is on a kidney transplant list.  She also has arthritis and history of headaches. Sleep issues noted last visit (09/2018)- wakes for 2 hours in the middle of the night and sleeps for only 4-5 hours per night.  New labs unknown. She does not know her most recent A1C but reports that she checks her BG 3-4 times per day and this is 98-104 in the am and 105-120 after meals. Medications include:  Lantus 10 units q am.  She is no longer taking [place] or Metformin. UBW 161-164 lbs but 177 lbs in March, 2020. (was eating more to help her medication not hurt her stomach).   Patient states since last visit, she has had increased stomach problems, gas and bloating.  She saw her GI MD about this.  She had stopped Zantac last fall as this was pulled from the market and was taking Pepcid which she states was not working and is now on [employer].  She states that she has had increased spicy foods as well. She has been freezing meals so that she has foods to eat on days she does not feel well or like cooking.   She states that she has had low motivation in regards to exercise and finds many excuses.  She did look into some of the on line options and has developed more of oa plan. She still verbalizes that she struggles with having multiple diet (one for  diabetes, one for her kidneys, one for stomach, one for HTN.)/  Will discuss meal planning further at next appointment.  Patient's son lives with her.  She is doing her own shopping about every 2 weeks.  She reports increased stress due to COVID fears and is working through it.  She is not getting much exercise.  She has thought about buying a treadmill.  She continues to try to follow a low sugar, low salt, low phosphorous diet. 32 ounce fluid restriction. She thinks that her weight has increased some and reports that this is due to She is on disability.  She previously worked as a Quarry manager. Enjoys reading, designing and sewing clothes and jewelry, crafts and decorating.  Preferred Learning Style:   Auditory  Learning Readiness:   Ready  Change in progress   Estimated energy needs: 1800-2000 calories 80-90 g protein  Progress Towards Goal(s):  In progress.   Nutritional Diagnosis:  NB-1.1 Food and nutrition-related knowledge deficit As related to diet for ESRD and CKD.  As evidenced by diet hx and patient report.    Intervention:  Nutrition counseling/education continued related to CKD, DM and other nutrition for HTN and HLD.  Discussed further other low sodium, convenience options.  Reviewed low sodium and label reading.  Discussed exercise options using you tube videos.  Plan: Low sodium  frozen meal options  LUVO  Homemade (cook extra on your good days and freeze in meal size containers)  Label reading:  Overall, aim for no more than about 600 mg sodium per day  Look for low sodium and no sodium added fresh or Boars head meat for example.  Exercise options  PBS television  You tube video    Walking videos    Arm Chair videos    Yoga appropriate for your needs  Continue the arm exercises with the band  Continue to find ways to provide self care.   Teaching Method Utilized:  Auditory  Barriers to learning/adherence to lifestyle change: health  Demonstrated degree of  understanding via:  Teach Back   Monitoring/Evaluation:  Dietary intake, exercise, label reading, and body weight in 5 week(s).

## 2019-01-15 NOTE — Patient Instructions (Addendum)
Consider avoiding or reducing your intake of spicy foods. Continue to avoid milk and milk products other than yogurt, parmesan cheese, and small amount of low fat cheese.  You stated that you wished to clean your bedroom to have more space to exercise.  This may help with your motivation.

## 2019-01-16 DIAGNOSIS — R17 Unspecified jaundice: Secondary | ICD-10-CM | POA: Diagnosis not present

## 2019-01-16 DIAGNOSIS — D631 Anemia in chronic kidney disease: Secondary | ICD-10-CM | POA: Diagnosis not present

## 2019-01-16 DIAGNOSIS — D509 Iron deficiency anemia, unspecified: Secondary | ICD-10-CM | POA: Diagnosis not present

## 2019-01-16 DIAGNOSIS — E440 Moderate protein-calorie malnutrition: Secondary | ICD-10-CM | POA: Diagnosis not present

## 2019-01-16 DIAGNOSIS — N186 End stage renal disease: Secondary | ICD-10-CM | POA: Diagnosis not present

## 2019-01-16 DIAGNOSIS — N2581 Secondary hyperparathyroidism of renal origin: Secondary | ICD-10-CM | POA: Diagnosis not present

## 2019-01-17 DIAGNOSIS — E440 Moderate protein-calorie malnutrition: Secondary | ICD-10-CM | POA: Diagnosis not present

## 2019-01-17 DIAGNOSIS — N186 End stage renal disease: Secondary | ICD-10-CM | POA: Diagnosis not present

## 2019-01-17 DIAGNOSIS — D509 Iron deficiency anemia, unspecified: Secondary | ICD-10-CM | POA: Diagnosis not present

## 2019-01-17 DIAGNOSIS — N2581 Secondary hyperparathyroidism of renal origin: Secondary | ICD-10-CM | POA: Diagnosis not present

## 2019-01-17 DIAGNOSIS — D631 Anemia in chronic kidney disease: Secondary | ICD-10-CM | POA: Diagnosis not present

## 2019-01-17 DIAGNOSIS — R17 Unspecified jaundice: Secondary | ICD-10-CM | POA: Diagnosis not present

## 2019-01-18 DIAGNOSIS — R17 Unspecified jaundice: Secondary | ICD-10-CM | POA: Diagnosis not present

## 2019-01-18 DIAGNOSIS — N2581 Secondary hyperparathyroidism of renal origin: Secondary | ICD-10-CM | POA: Diagnosis not present

## 2019-01-18 DIAGNOSIS — D509 Iron deficiency anemia, unspecified: Secondary | ICD-10-CM | POA: Diagnosis not present

## 2019-01-18 DIAGNOSIS — D631 Anemia in chronic kidney disease: Secondary | ICD-10-CM | POA: Diagnosis not present

## 2019-01-18 DIAGNOSIS — E440 Moderate protein-calorie malnutrition: Secondary | ICD-10-CM | POA: Diagnosis not present

## 2019-01-18 DIAGNOSIS — N186 End stage renal disease: Secondary | ICD-10-CM | POA: Diagnosis not present

## 2019-01-19 DIAGNOSIS — R17 Unspecified jaundice: Secondary | ICD-10-CM | POA: Diagnosis not present

## 2019-01-19 DIAGNOSIS — D509 Iron deficiency anemia, unspecified: Secondary | ICD-10-CM | POA: Diagnosis not present

## 2019-01-19 DIAGNOSIS — N186 End stage renal disease: Secondary | ICD-10-CM | POA: Diagnosis not present

## 2019-01-19 DIAGNOSIS — N2581 Secondary hyperparathyroidism of renal origin: Secondary | ICD-10-CM | POA: Diagnosis not present

## 2019-01-19 DIAGNOSIS — D631 Anemia in chronic kidney disease: Secondary | ICD-10-CM | POA: Diagnosis not present

## 2019-01-19 DIAGNOSIS — E440 Moderate protein-calorie malnutrition: Secondary | ICD-10-CM | POA: Diagnosis not present

## 2019-01-20 DIAGNOSIS — N186 End stage renal disease: Secondary | ICD-10-CM | POA: Diagnosis not present

## 2019-01-20 DIAGNOSIS — D631 Anemia in chronic kidney disease: Secondary | ICD-10-CM | POA: Diagnosis not present

## 2019-01-20 DIAGNOSIS — D509 Iron deficiency anemia, unspecified: Secondary | ICD-10-CM | POA: Diagnosis not present

## 2019-01-20 DIAGNOSIS — N2581 Secondary hyperparathyroidism of renal origin: Secondary | ICD-10-CM | POA: Diagnosis not present

## 2019-01-20 DIAGNOSIS — E440 Moderate protein-calorie malnutrition: Secondary | ICD-10-CM | POA: Diagnosis not present

## 2019-01-20 DIAGNOSIS — R17 Unspecified jaundice: Secondary | ICD-10-CM | POA: Diagnosis not present

## 2019-01-20 DIAGNOSIS — M25562 Pain in left knee: Secondary | ICD-10-CM | POA: Diagnosis not present

## 2019-01-20 DIAGNOSIS — M25561 Pain in right knee: Secondary | ICD-10-CM | POA: Diagnosis not present

## 2019-01-21 DIAGNOSIS — Z992 Dependence on renal dialysis: Secondary | ICD-10-CM | POA: Diagnosis not present

## 2019-01-21 DIAGNOSIS — N186 End stage renal disease: Secondary | ICD-10-CM | POA: Diagnosis not present

## 2019-01-21 DIAGNOSIS — Z4932 Encounter for adequacy testing for peritoneal dialysis: Secondary | ICD-10-CM | POA: Diagnosis not present

## 2019-01-21 DIAGNOSIS — N2581 Secondary hyperparathyroidism of renal origin: Secondary | ICD-10-CM | POA: Diagnosis not present

## 2019-01-21 DIAGNOSIS — I129 Hypertensive chronic kidney disease with stage 1 through stage 4 chronic kidney disease, or unspecified chronic kidney disease: Secondary | ICD-10-CM | POA: Diagnosis not present

## 2019-01-21 DIAGNOSIS — D631 Anemia in chronic kidney disease: Secondary | ICD-10-CM | POA: Diagnosis not present

## 2019-01-21 DIAGNOSIS — N2589 Other disorders resulting from impaired renal tubular function: Secondary | ICD-10-CM | POA: Diagnosis not present

## 2019-01-21 DIAGNOSIS — R82994 Hypercalciuria: Secondary | ICD-10-CM | POA: Diagnosis not present

## 2019-01-21 DIAGNOSIS — D509 Iron deficiency anemia, unspecified: Secondary | ICD-10-CM | POA: Diagnosis not present

## 2019-01-21 DIAGNOSIS — E1129 Type 2 diabetes mellitus with other diabetic kidney complication: Secondary | ICD-10-CM | POA: Diagnosis not present

## 2019-01-22 ENCOUNTER — Telehealth: Payer: Self-pay

## 2019-01-22 DIAGNOSIS — N2589 Other disorders resulting from impaired renal tubular function: Secondary | ICD-10-CM | POA: Diagnosis not present

## 2019-01-22 DIAGNOSIS — D509 Iron deficiency anemia, unspecified: Secondary | ICD-10-CM | POA: Diagnosis not present

## 2019-01-22 DIAGNOSIS — N2581 Secondary hyperparathyroidism of renal origin: Secondary | ICD-10-CM | POA: Diagnosis not present

## 2019-01-22 DIAGNOSIS — D631 Anemia in chronic kidney disease: Secondary | ICD-10-CM | POA: Diagnosis not present

## 2019-01-22 DIAGNOSIS — Z4932 Encounter for adequacy testing for peritoneal dialysis: Secondary | ICD-10-CM | POA: Diagnosis not present

## 2019-01-22 DIAGNOSIS — N186 End stage renal disease: Secondary | ICD-10-CM | POA: Diagnosis not present

## 2019-01-22 NOTE — Telephone Encounter (Signed)
Left message to schedule a in office follow up visit. See 12-26-2018 note tt

## 2019-01-22 NOTE — Telephone Encounter (Signed)
In person spots on the afternoon of 02-24-2019

## 2019-01-23 DIAGNOSIS — N2581 Secondary hyperparathyroidism of renal origin: Secondary | ICD-10-CM | POA: Diagnosis not present

## 2019-01-23 DIAGNOSIS — D631 Anemia in chronic kidney disease: Secondary | ICD-10-CM | POA: Diagnosis not present

## 2019-01-23 DIAGNOSIS — N2589 Other disorders resulting from impaired renal tubular function: Secondary | ICD-10-CM | POA: Diagnosis not present

## 2019-01-23 DIAGNOSIS — N186 End stage renal disease: Secondary | ICD-10-CM | POA: Diagnosis not present

## 2019-01-23 DIAGNOSIS — Z4932 Encounter for adequacy testing for peritoneal dialysis: Secondary | ICD-10-CM | POA: Diagnosis not present

## 2019-01-23 DIAGNOSIS — D509 Iron deficiency anemia, unspecified: Secondary | ICD-10-CM | POA: Diagnosis not present

## 2019-01-24 DIAGNOSIS — N2589 Other disorders resulting from impaired renal tubular function: Secondary | ICD-10-CM | POA: Diagnosis not present

## 2019-01-24 DIAGNOSIS — Z4932 Encounter for adequacy testing for peritoneal dialysis: Secondary | ICD-10-CM | POA: Diagnosis not present

## 2019-01-24 DIAGNOSIS — N186 End stage renal disease: Secondary | ICD-10-CM | POA: Diagnosis not present

## 2019-01-24 DIAGNOSIS — D509 Iron deficiency anemia, unspecified: Secondary | ICD-10-CM | POA: Diagnosis not present

## 2019-01-24 DIAGNOSIS — N2581 Secondary hyperparathyroidism of renal origin: Secondary | ICD-10-CM | POA: Diagnosis not present

## 2019-01-24 DIAGNOSIS — D631 Anemia in chronic kidney disease: Secondary | ICD-10-CM | POA: Diagnosis not present

## 2019-01-25 DIAGNOSIS — N2581 Secondary hyperparathyroidism of renal origin: Secondary | ICD-10-CM | POA: Diagnosis not present

## 2019-01-25 DIAGNOSIS — D631 Anemia in chronic kidney disease: Secondary | ICD-10-CM | POA: Diagnosis not present

## 2019-01-25 DIAGNOSIS — N2589 Other disorders resulting from impaired renal tubular function: Secondary | ICD-10-CM | POA: Diagnosis not present

## 2019-01-25 DIAGNOSIS — Z4932 Encounter for adequacy testing for peritoneal dialysis: Secondary | ICD-10-CM | POA: Diagnosis not present

## 2019-01-25 DIAGNOSIS — N186 End stage renal disease: Secondary | ICD-10-CM | POA: Diagnosis not present

## 2019-01-25 DIAGNOSIS — D509 Iron deficiency anemia, unspecified: Secondary | ICD-10-CM | POA: Diagnosis not present

## 2019-01-26 DIAGNOSIS — D631 Anemia in chronic kidney disease: Secondary | ICD-10-CM | POA: Diagnosis not present

## 2019-01-26 DIAGNOSIS — D509 Iron deficiency anemia, unspecified: Secondary | ICD-10-CM | POA: Diagnosis not present

## 2019-01-26 DIAGNOSIS — N2581 Secondary hyperparathyroidism of renal origin: Secondary | ICD-10-CM | POA: Diagnosis not present

## 2019-01-26 DIAGNOSIS — N186 End stage renal disease: Secondary | ICD-10-CM | POA: Diagnosis not present

## 2019-01-26 DIAGNOSIS — Z4932 Encounter for adequacy testing for peritoneal dialysis: Secondary | ICD-10-CM | POA: Diagnosis not present

## 2019-01-26 DIAGNOSIS — N2589 Other disorders resulting from impaired renal tubular function: Secondary | ICD-10-CM | POA: Diagnosis not present

## 2019-01-27 DIAGNOSIS — N2581 Secondary hyperparathyroidism of renal origin: Secondary | ICD-10-CM | POA: Diagnosis not present

## 2019-01-27 DIAGNOSIS — Z4932 Encounter for adequacy testing for peritoneal dialysis: Secondary | ICD-10-CM | POA: Diagnosis not present

## 2019-01-27 DIAGNOSIS — D631 Anemia in chronic kidney disease: Secondary | ICD-10-CM | POA: Diagnosis not present

## 2019-01-27 DIAGNOSIS — N2589 Other disorders resulting from impaired renal tubular function: Secondary | ICD-10-CM | POA: Diagnosis not present

## 2019-01-27 DIAGNOSIS — N186 End stage renal disease: Secondary | ICD-10-CM | POA: Diagnosis not present

## 2019-01-27 DIAGNOSIS — D509 Iron deficiency anemia, unspecified: Secondary | ICD-10-CM | POA: Diagnosis not present

## 2019-01-28 DIAGNOSIS — D509 Iron deficiency anemia, unspecified: Secondary | ICD-10-CM | POA: Diagnosis not present

## 2019-01-28 DIAGNOSIS — D631 Anemia in chronic kidney disease: Secondary | ICD-10-CM | POA: Diagnosis not present

## 2019-01-28 DIAGNOSIS — M25562 Pain in left knee: Secondary | ICD-10-CM | POA: Diagnosis not present

## 2019-01-28 DIAGNOSIS — N2581 Secondary hyperparathyroidism of renal origin: Secondary | ICD-10-CM | POA: Diagnosis not present

## 2019-01-28 DIAGNOSIS — Z4932 Encounter for adequacy testing for peritoneal dialysis: Secondary | ICD-10-CM | POA: Diagnosis not present

## 2019-01-28 DIAGNOSIS — N186 End stage renal disease: Secondary | ICD-10-CM | POA: Diagnosis not present

## 2019-01-28 DIAGNOSIS — N2589 Other disorders resulting from impaired renal tubular function: Secondary | ICD-10-CM | POA: Diagnosis not present

## 2019-01-28 DIAGNOSIS — M25561 Pain in right knee: Secondary | ICD-10-CM | POA: Diagnosis not present

## 2019-01-29 DIAGNOSIS — Z4932 Encounter for adequacy testing for peritoneal dialysis: Secondary | ICD-10-CM | POA: Diagnosis not present

## 2019-01-29 DIAGNOSIS — D509 Iron deficiency anemia, unspecified: Secondary | ICD-10-CM | POA: Diagnosis not present

## 2019-01-29 DIAGNOSIS — D631 Anemia in chronic kidney disease: Secondary | ICD-10-CM | POA: Diagnosis not present

## 2019-01-29 DIAGNOSIS — N186 End stage renal disease: Secondary | ICD-10-CM | POA: Diagnosis not present

## 2019-01-29 DIAGNOSIS — N2589 Other disorders resulting from impaired renal tubular function: Secondary | ICD-10-CM | POA: Diagnosis not present

## 2019-01-29 DIAGNOSIS — N2581 Secondary hyperparathyroidism of renal origin: Secondary | ICD-10-CM | POA: Diagnosis not present

## 2019-01-30 DIAGNOSIS — D631 Anemia in chronic kidney disease: Secondary | ICD-10-CM | POA: Diagnosis not present

## 2019-01-30 DIAGNOSIS — Z4932 Encounter for adequacy testing for peritoneal dialysis: Secondary | ICD-10-CM | POA: Diagnosis not present

## 2019-01-30 DIAGNOSIS — D509 Iron deficiency anemia, unspecified: Secondary | ICD-10-CM | POA: Diagnosis not present

## 2019-01-30 DIAGNOSIS — N2581 Secondary hyperparathyroidism of renal origin: Secondary | ICD-10-CM | POA: Diagnosis not present

## 2019-01-30 DIAGNOSIS — N186 End stage renal disease: Secondary | ICD-10-CM | POA: Diagnosis not present

## 2019-01-30 DIAGNOSIS — N2589 Other disorders resulting from impaired renal tubular function: Secondary | ICD-10-CM | POA: Diagnosis not present

## 2019-01-30 DIAGNOSIS — M25562 Pain in left knee: Secondary | ICD-10-CM | POA: Diagnosis not present

## 2019-01-30 DIAGNOSIS — M25561 Pain in right knee: Secondary | ICD-10-CM | POA: Diagnosis not present

## 2019-01-31 DIAGNOSIS — D509 Iron deficiency anemia, unspecified: Secondary | ICD-10-CM | POA: Diagnosis not present

## 2019-01-31 DIAGNOSIS — N186 End stage renal disease: Secondary | ICD-10-CM | POA: Diagnosis not present

## 2019-01-31 DIAGNOSIS — D631 Anemia in chronic kidney disease: Secondary | ICD-10-CM | POA: Diagnosis not present

## 2019-01-31 DIAGNOSIS — N2581 Secondary hyperparathyroidism of renal origin: Secondary | ICD-10-CM | POA: Diagnosis not present

## 2019-01-31 DIAGNOSIS — Z4932 Encounter for adequacy testing for peritoneal dialysis: Secondary | ICD-10-CM | POA: Diagnosis not present

## 2019-01-31 DIAGNOSIS — N2589 Other disorders resulting from impaired renal tubular function: Secondary | ICD-10-CM | POA: Diagnosis not present

## 2019-02-01 DIAGNOSIS — Z4932 Encounter for adequacy testing for peritoneal dialysis: Secondary | ICD-10-CM | POA: Diagnosis not present

## 2019-02-01 DIAGNOSIS — N2589 Other disorders resulting from impaired renal tubular function: Secondary | ICD-10-CM | POA: Diagnosis not present

## 2019-02-01 DIAGNOSIS — D509 Iron deficiency anemia, unspecified: Secondary | ICD-10-CM | POA: Diagnosis not present

## 2019-02-01 DIAGNOSIS — N2581 Secondary hyperparathyroidism of renal origin: Secondary | ICD-10-CM | POA: Diagnosis not present

## 2019-02-01 DIAGNOSIS — N186 End stage renal disease: Secondary | ICD-10-CM | POA: Diagnosis not present

## 2019-02-01 DIAGNOSIS — D631 Anemia in chronic kidney disease: Secondary | ICD-10-CM | POA: Diagnosis not present

## 2019-02-02 DIAGNOSIS — N2581 Secondary hyperparathyroidism of renal origin: Secondary | ICD-10-CM | POA: Diagnosis not present

## 2019-02-02 DIAGNOSIS — D509 Iron deficiency anemia, unspecified: Secondary | ICD-10-CM | POA: Diagnosis not present

## 2019-02-02 DIAGNOSIS — N186 End stage renal disease: Secondary | ICD-10-CM | POA: Diagnosis not present

## 2019-02-02 DIAGNOSIS — N2589 Other disorders resulting from impaired renal tubular function: Secondary | ICD-10-CM | POA: Diagnosis not present

## 2019-02-02 DIAGNOSIS — D631 Anemia in chronic kidney disease: Secondary | ICD-10-CM | POA: Diagnosis not present

## 2019-02-02 DIAGNOSIS — Z4932 Encounter for adequacy testing for peritoneal dialysis: Secondary | ICD-10-CM | POA: Diagnosis not present

## 2019-02-03 DIAGNOSIS — N186 End stage renal disease: Secondary | ICD-10-CM | POA: Diagnosis not present

## 2019-02-03 DIAGNOSIS — D631 Anemia in chronic kidney disease: Secondary | ICD-10-CM | POA: Diagnosis not present

## 2019-02-03 DIAGNOSIS — N2581 Secondary hyperparathyroidism of renal origin: Secondary | ICD-10-CM | POA: Diagnosis not present

## 2019-02-03 DIAGNOSIS — N2589 Other disorders resulting from impaired renal tubular function: Secondary | ICD-10-CM | POA: Diagnosis not present

## 2019-02-03 DIAGNOSIS — Z4932 Encounter for adequacy testing for peritoneal dialysis: Secondary | ICD-10-CM | POA: Diagnosis not present

## 2019-02-03 DIAGNOSIS — D509 Iron deficiency anemia, unspecified: Secondary | ICD-10-CM | POA: Diagnosis not present

## 2019-02-04 DIAGNOSIS — N2581 Secondary hyperparathyroidism of renal origin: Secondary | ICD-10-CM | POA: Diagnosis not present

## 2019-02-04 DIAGNOSIS — D631 Anemia in chronic kidney disease: Secondary | ICD-10-CM | POA: Diagnosis not present

## 2019-02-04 DIAGNOSIS — N186 End stage renal disease: Secondary | ICD-10-CM | POA: Diagnosis not present

## 2019-02-04 DIAGNOSIS — D509 Iron deficiency anemia, unspecified: Secondary | ICD-10-CM | POA: Diagnosis not present

## 2019-02-04 DIAGNOSIS — Z4932 Encounter for adequacy testing for peritoneal dialysis: Secondary | ICD-10-CM | POA: Diagnosis not present

## 2019-02-04 DIAGNOSIS — N2589 Other disorders resulting from impaired renal tubular function: Secondary | ICD-10-CM | POA: Diagnosis not present

## 2019-02-05 DIAGNOSIS — D631 Anemia in chronic kidney disease: Secondary | ICD-10-CM | POA: Diagnosis not present

## 2019-02-05 DIAGNOSIS — Z4932 Encounter for adequacy testing for peritoneal dialysis: Secondary | ICD-10-CM | POA: Diagnosis not present

## 2019-02-05 DIAGNOSIS — N2581 Secondary hyperparathyroidism of renal origin: Secondary | ICD-10-CM | POA: Diagnosis not present

## 2019-02-05 DIAGNOSIS — N2589 Other disorders resulting from impaired renal tubular function: Secondary | ICD-10-CM | POA: Diagnosis not present

## 2019-02-05 DIAGNOSIS — D509 Iron deficiency anemia, unspecified: Secondary | ICD-10-CM | POA: Diagnosis not present

## 2019-02-05 DIAGNOSIS — N186 End stage renal disease: Secondary | ICD-10-CM | POA: Diagnosis not present

## 2019-02-06 DIAGNOSIS — D631 Anemia in chronic kidney disease: Secondary | ICD-10-CM | POA: Diagnosis not present

## 2019-02-06 DIAGNOSIS — N2581 Secondary hyperparathyroidism of renal origin: Secondary | ICD-10-CM | POA: Diagnosis not present

## 2019-02-06 DIAGNOSIS — D509 Iron deficiency anemia, unspecified: Secondary | ICD-10-CM | POA: Diagnosis not present

## 2019-02-06 DIAGNOSIS — N186 End stage renal disease: Secondary | ICD-10-CM | POA: Diagnosis not present

## 2019-02-06 DIAGNOSIS — Z4932 Encounter for adequacy testing for peritoneal dialysis: Secondary | ICD-10-CM | POA: Diagnosis not present

## 2019-02-06 DIAGNOSIS — N2589 Other disorders resulting from impaired renal tubular function: Secondary | ICD-10-CM | POA: Diagnosis not present

## 2019-02-07 DIAGNOSIS — D631 Anemia in chronic kidney disease: Secondary | ICD-10-CM | POA: Diagnosis not present

## 2019-02-07 DIAGNOSIS — Z4932 Encounter for adequacy testing for peritoneal dialysis: Secondary | ICD-10-CM | POA: Diagnosis not present

## 2019-02-07 DIAGNOSIS — N2589 Other disorders resulting from impaired renal tubular function: Secondary | ICD-10-CM | POA: Diagnosis not present

## 2019-02-07 DIAGNOSIS — D509 Iron deficiency anemia, unspecified: Secondary | ICD-10-CM | POA: Diagnosis not present

## 2019-02-07 DIAGNOSIS — N186 End stage renal disease: Secondary | ICD-10-CM | POA: Diagnosis not present

## 2019-02-07 DIAGNOSIS — N2581 Secondary hyperparathyroidism of renal origin: Secondary | ICD-10-CM | POA: Diagnosis not present

## 2019-02-08 DIAGNOSIS — N2581 Secondary hyperparathyroidism of renal origin: Secondary | ICD-10-CM | POA: Diagnosis not present

## 2019-02-08 DIAGNOSIS — D509 Iron deficiency anemia, unspecified: Secondary | ICD-10-CM | POA: Diagnosis not present

## 2019-02-08 DIAGNOSIS — N2589 Other disorders resulting from impaired renal tubular function: Secondary | ICD-10-CM | POA: Diagnosis not present

## 2019-02-08 DIAGNOSIS — D631 Anemia in chronic kidney disease: Secondary | ICD-10-CM | POA: Diagnosis not present

## 2019-02-08 DIAGNOSIS — Z4932 Encounter for adequacy testing for peritoneal dialysis: Secondary | ICD-10-CM | POA: Diagnosis not present

## 2019-02-08 DIAGNOSIS — N186 End stage renal disease: Secondary | ICD-10-CM | POA: Diagnosis not present

## 2019-02-09 DIAGNOSIS — N2589 Other disorders resulting from impaired renal tubular function: Secondary | ICD-10-CM | POA: Diagnosis not present

## 2019-02-09 DIAGNOSIS — Z4932 Encounter for adequacy testing for peritoneal dialysis: Secondary | ICD-10-CM | POA: Diagnosis not present

## 2019-02-09 DIAGNOSIS — D631 Anemia in chronic kidney disease: Secondary | ICD-10-CM | POA: Diagnosis not present

## 2019-02-09 DIAGNOSIS — N2581 Secondary hyperparathyroidism of renal origin: Secondary | ICD-10-CM | POA: Diagnosis not present

## 2019-02-09 DIAGNOSIS — M25561 Pain in right knee: Secondary | ICD-10-CM | POA: Diagnosis not present

## 2019-02-09 DIAGNOSIS — M25562 Pain in left knee: Secondary | ICD-10-CM | POA: Diagnosis not present

## 2019-02-09 DIAGNOSIS — N186 End stage renal disease: Secondary | ICD-10-CM | POA: Diagnosis not present

## 2019-02-09 DIAGNOSIS — D509 Iron deficiency anemia, unspecified: Secondary | ICD-10-CM | POA: Diagnosis not present

## 2019-02-10 DIAGNOSIS — D509 Iron deficiency anemia, unspecified: Secondary | ICD-10-CM | POA: Diagnosis not present

## 2019-02-10 DIAGNOSIS — N186 End stage renal disease: Secondary | ICD-10-CM | POA: Diagnosis not present

## 2019-02-10 DIAGNOSIS — D631 Anemia in chronic kidney disease: Secondary | ICD-10-CM | POA: Diagnosis not present

## 2019-02-10 DIAGNOSIS — N2589 Other disorders resulting from impaired renal tubular function: Secondary | ICD-10-CM | POA: Diagnosis not present

## 2019-02-10 DIAGNOSIS — Z4932 Encounter for adequacy testing for peritoneal dialysis: Secondary | ICD-10-CM | POA: Diagnosis not present

## 2019-02-10 DIAGNOSIS — N2581 Secondary hyperparathyroidism of renal origin: Secondary | ICD-10-CM | POA: Diagnosis not present

## 2019-02-11 DIAGNOSIS — N186 End stage renal disease: Secondary | ICD-10-CM | POA: Diagnosis not present

## 2019-02-11 DIAGNOSIS — D509 Iron deficiency anemia, unspecified: Secondary | ICD-10-CM | POA: Diagnosis not present

## 2019-02-11 DIAGNOSIS — N2589 Other disorders resulting from impaired renal tubular function: Secondary | ICD-10-CM | POA: Diagnosis not present

## 2019-02-11 DIAGNOSIS — Z4932 Encounter for adequacy testing for peritoneal dialysis: Secondary | ICD-10-CM | POA: Diagnosis not present

## 2019-02-11 DIAGNOSIS — D631 Anemia in chronic kidney disease: Secondary | ICD-10-CM | POA: Diagnosis not present

## 2019-02-11 DIAGNOSIS — N2581 Secondary hyperparathyroidism of renal origin: Secondary | ICD-10-CM | POA: Diagnosis not present

## 2019-02-12 DIAGNOSIS — N186 End stage renal disease: Secondary | ICD-10-CM | POA: Diagnosis not present

## 2019-02-12 DIAGNOSIS — N2589 Other disorders resulting from impaired renal tubular function: Secondary | ICD-10-CM | POA: Diagnosis not present

## 2019-02-12 DIAGNOSIS — D509 Iron deficiency anemia, unspecified: Secondary | ICD-10-CM | POA: Diagnosis not present

## 2019-02-12 DIAGNOSIS — D631 Anemia in chronic kidney disease: Secondary | ICD-10-CM | POA: Diagnosis not present

## 2019-02-12 DIAGNOSIS — Z4932 Encounter for adequacy testing for peritoneal dialysis: Secondary | ICD-10-CM | POA: Diagnosis not present

## 2019-02-12 DIAGNOSIS — N2581 Secondary hyperparathyroidism of renal origin: Secondary | ICD-10-CM | POA: Diagnosis not present

## 2019-02-13 DIAGNOSIS — Z4932 Encounter for adequacy testing for peritoneal dialysis: Secondary | ICD-10-CM | POA: Diagnosis not present

## 2019-02-13 DIAGNOSIS — D509 Iron deficiency anemia, unspecified: Secondary | ICD-10-CM | POA: Diagnosis not present

## 2019-02-13 DIAGNOSIS — N2589 Other disorders resulting from impaired renal tubular function: Secondary | ICD-10-CM | POA: Diagnosis not present

## 2019-02-13 DIAGNOSIS — M25561 Pain in right knee: Secondary | ICD-10-CM | POA: Diagnosis not present

## 2019-02-13 DIAGNOSIS — D631 Anemia in chronic kidney disease: Secondary | ICD-10-CM | POA: Diagnosis not present

## 2019-02-13 DIAGNOSIS — N2581 Secondary hyperparathyroidism of renal origin: Secondary | ICD-10-CM | POA: Diagnosis not present

## 2019-02-13 DIAGNOSIS — M25562 Pain in left knee: Secondary | ICD-10-CM | POA: Diagnosis not present

## 2019-02-13 DIAGNOSIS — N186 End stage renal disease: Secondary | ICD-10-CM | POA: Diagnosis not present

## 2019-02-14 DIAGNOSIS — N186 End stage renal disease: Secondary | ICD-10-CM | POA: Diagnosis not present

## 2019-02-14 DIAGNOSIS — N2589 Other disorders resulting from impaired renal tubular function: Secondary | ICD-10-CM | POA: Diagnosis not present

## 2019-02-14 DIAGNOSIS — N2581 Secondary hyperparathyroidism of renal origin: Secondary | ICD-10-CM | POA: Diagnosis not present

## 2019-02-14 DIAGNOSIS — Z4932 Encounter for adequacy testing for peritoneal dialysis: Secondary | ICD-10-CM | POA: Diagnosis not present

## 2019-02-14 DIAGNOSIS — D631 Anemia in chronic kidney disease: Secondary | ICD-10-CM | POA: Diagnosis not present

## 2019-02-14 DIAGNOSIS — D509 Iron deficiency anemia, unspecified: Secondary | ICD-10-CM | POA: Diagnosis not present

## 2019-02-15 DIAGNOSIS — N2589 Other disorders resulting from impaired renal tubular function: Secondary | ICD-10-CM | POA: Diagnosis not present

## 2019-02-15 DIAGNOSIS — D509 Iron deficiency anemia, unspecified: Secondary | ICD-10-CM | POA: Diagnosis not present

## 2019-02-15 DIAGNOSIS — N186 End stage renal disease: Secondary | ICD-10-CM | POA: Diagnosis not present

## 2019-02-15 DIAGNOSIS — N2581 Secondary hyperparathyroidism of renal origin: Secondary | ICD-10-CM | POA: Diagnosis not present

## 2019-02-15 DIAGNOSIS — D631 Anemia in chronic kidney disease: Secondary | ICD-10-CM | POA: Diagnosis not present

## 2019-02-15 DIAGNOSIS — Z4932 Encounter for adequacy testing for peritoneal dialysis: Secondary | ICD-10-CM | POA: Diagnosis not present

## 2019-02-16 DIAGNOSIS — Z4932 Encounter for adequacy testing for peritoneal dialysis: Secondary | ICD-10-CM | POA: Diagnosis not present

## 2019-02-16 DIAGNOSIS — N2581 Secondary hyperparathyroidism of renal origin: Secondary | ICD-10-CM | POA: Diagnosis not present

## 2019-02-16 DIAGNOSIS — N2589 Other disorders resulting from impaired renal tubular function: Secondary | ICD-10-CM | POA: Diagnosis not present

## 2019-02-16 DIAGNOSIS — D509 Iron deficiency anemia, unspecified: Secondary | ICD-10-CM | POA: Diagnosis not present

## 2019-02-16 DIAGNOSIS — N186 End stage renal disease: Secondary | ICD-10-CM | POA: Diagnosis not present

## 2019-02-16 DIAGNOSIS — D631 Anemia in chronic kidney disease: Secondary | ICD-10-CM | POA: Diagnosis not present

## 2019-02-17 DIAGNOSIS — D509 Iron deficiency anemia, unspecified: Secondary | ICD-10-CM | POA: Diagnosis not present

## 2019-02-17 DIAGNOSIS — N186 End stage renal disease: Secondary | ICD-10-CM | POA: Diagnosis not present

## 2019-02-17 DIAGNOSIS — Z4932 Encounter for adequacy testing for peritoneal dialysis: Secondary | ICD-10-CM | POA: Diagnosis not present

## 2019-02-17 DIAGNOSIS — N2581 Secondary hyperparathyroidism of renal origin: Secondary | ICD-10-CM | POA: Diagnosis not present

## 2019-02-17 DIAGNOSIS — D631 Anemia in chronic kidney disease: Secondary | ICD-10-CM | POA: Diagnosis not present

## 2019-02-17 DIAGNOSIS — N2589 Other disorders resulting from impaired renal tubular function: Secondary | ICD-10-CM | POA: Diagnosis not present

## 2019-02-18 DIAGNOSIS — N2581 Secondary hyperparathyroidism of renal origin: Secondary | ICD-10-CM | POA: Diagnosis not present

## 2019-02-18 DIAGNOSIS — Z4932 Encounter for adequacy testing for peritoneal dialysis: Secondary | ICD-10-CM | POA: Diagnosis not present

## 2019-02-18 DIAGNOSIS — D631 Anemia in chronic kidney disease: Secondary | ICD-10-CM | POA: Diagnosis not present

## 2019-02-18 DIAGNOSIS — D509 Iron deficiency anemia, unspecified: Secondary | ICD-10-CM | POA: Diagnosis not present

## 2019-02-18 DIAGNOSIS — N2589 Other disorders resulting from impaired renal tubular function: Secondary | ICD-10-CM | POA: Diagnosis not present

## 2019-02-18 DIAGNOSIS — N186 End stage renal disease: Secondary | ICD-10-CM | POA: Diagnosis not present

## 2019-02-19 DIAGNOSIS — D509 Iron deficiency anemia, unspecified: Secondary | ICD-10-CM | POA: Diagnosis not present

## 2019-02-19 DIAGNOSIS — N186 End stage renal disease: Secondary | ICD-10-CM | POA: Diagnosis not present

## 2019-02-19 DIAGNOSIS — N2581 Secondary hyperparathyroidism of renal origin: Secondary | ICD-10-CM | POA: Diagnosis not present

## 2019-02-19 DIAGNOSIS — D631 Anemia in chronic kidney disease: Secondary | ICD-10-CM | POA: Diagnosis not present

## 2019-02-19 DIAGNOSIS — N2589 Other disorders resulting from impaired renal tubular function: Secondary | ICD-10-CM | POA: Diagnosis not present

## 2019-02-19 DIAGNOSIS — Z4932 Encounter for adequacy testing for peritoneal dialysis: Secondary | ICD-10-CM | POA: Diagnosis not present

## 2019-02-20 DIAGNOSIS — D631 Anemia in chronic kidney disease: Secondary | ICD-10-CM | POA: Diagnosis not present

## 2019-02-20 DIAGNOSIS — N2581 Secondary hyperparathyroidism of renal origin: Secondary | ICD-10-CM | POA: Diagnosis not present

## 2019-02-20 DIAGNOSIS — D509 Iron deficiency anemia, unspecified: Secondary | ICD-10-CM | POA: Diagnosis not present

## 2019-02-20 DIAGNOSIS — N2589 Other disorders resulting from impaired renal tubular function: Secondary | ICD-10-CM | POA: Diagnosis not present

## 2019-02-20 DIAGNOSIS — N186 End stage renal disease: Secondary | ICD-10-CM | POA: Diagnosis not present

## 2019-02-20 DIAGNOSIS — Z4932 Encounter for adequacy testing for peritoneal dialysis: Secondary | ICD-10-CM | POA: Diagnosis not present

## 2019-02-21 DIAGNOSIS — D631 Anemia in chronic kidney disease: Secondary | ICD-10-CM | POA: Diagnosis not present

## 2019-02-21 DIAGNOSIS — N2581 Secondary hyperparathyroidism of renal origin: Secondary | ICD-10-CM | POA: Diagnosis not present

## 2019-02-21 DIAGNOSIS — D509 Iron deficiency anemia, unspecified: Secondary | ICD-10-CM | POA: Diagnosis not present

## 2019-02-21 DIAGNOSIS — N186 End stage renal disease: Secondary | ICD-10-CM | POA: Diagnosis not present

## 2019-02-21 DIAGNOSIS — Z79899 Other long term (current) drug therapy: Secondary | ICD-10-CM | POA: Diagnosis not present

## 2019-02-21 DIAGNOSIS — Z992 Dependence on renal dialysis: Secondary | ICD-10-CM | POA: Diagnosis not present

## 2019-02-21 DIAGNOSIS — R17 Unspecified jaundice: Secondary | ICD-10-CM | POA: Diagnosis not present

## 2019-02-21 DIAGNOSIS — Z4932 Encounter for adequacy testing for peritoneal dialysis: Secondary | ICD-10-CM | POA: Diagnosis not present

## 2019-02-21 DIAGNOSIS — E440 Moderate protein-calorie malnutrition: Secondary | ICD-10-CM | POA: Diagnosis not present

## 2019-02-22 DIAGNOSIS — N2581 Secondary hyperparathyroidism of renal origin: Secondary | ICD-10-CM | POA: Diagnosis not present

## 2019-02-22 DIAGNOSIS — E440 Moderate protein-calorie malnutrition: Secondary | ICD-10-CM | POA: Diagnosis not present

## 2019-02-22 DIAGNOSIS — N186 End stage renal disease: Secondary | ICD-10-CM | POA: Diagnosis not present

## 2019-02-22 DIAGNOSIS — Z992 Dependence on renal dialysis: Secondary | ICD-10-CM | POA: Diagnosis not present

## 2019-02-22 DIAGNOSIS — Z79899 Other long term (current) drug therapy: Secondary | ICD-10-CM | POA: Diagnosis not present

## 2019-02-22 DIAGNOSIS — D509 Iron deficiency anemia, unspecified: Secondary | ICD-10-CM | POA: Diagnosis not present

## 2019-02-23 DIAGNOSIS — E440 Moderate protein-calorie malnutrition: Secondary | ICD-10-CM | POA: Diagnosis not present

## 2019-02-23 DIAGNOSIS — Z79899 Other long term (current) drug therapy: Secondary | ICD-10-CM | POA: Diagnosis not present

## 2019-02-23 DIAGNOSIS — Z992 Dependence on renal dialysis: Secondary | ICD-10-CM | POA: Diagnosis not present

## 2019-02-23 DIAGNOSIS — N186 End stage renal disease: Secondary | ICD-10-CM | POA: Diagnosis not present

## 2019-02-23 DIAGNOSIS — N2581 Secondary hyperparathyroidism of renal origin: Secondary | ICD-10-CM | POA: Diagnosis not present

## 2019-02-23 DIAGNOSIS — D509 Iron deficiency anemia, unspecified: Secondary | ICD-10-CM | POA: Diagnosis not present

## 2019-02-24 DIAGNOSIS — Z992 Dependence on renal dialysis: Secondary | ICD-10-CM | POA: Diagnosis not present

## 2019-02-24 DIAGNOSIS — N2581 Secondary hyperparathyroidism of renal origin: Secondary | ICD-10-CM | POA: Diagnosis not present

## 2019-02-24 DIAGNOSIS — E440 Moderate protein-calorie malnutrition: Secondary | ICD-10-CM | POA: Diagnosis not present

## 2019-02-24 DIAGNOSIS — Z79899 Other long term (current) drug therapy: Secondary | ICD-10-CM | POA: Diagnosis not present

## 2019-02-24 DIAGNOSIS — N186 End stage renal disease: Secondary | ICD-10-CM | POA: Diagnosis not present

## 2019-02-24 DIAGNOSIS — D509 Iron deficiency anemia, unspecified: Secondary | ICD-10-CM | POA: Diagnosis not present

## 2019-02-25 DIAGNOSIS — N2581 Secondary hyperparathyroidism of renal origin: Secondary | ICD-10-CM | POA: Diagnosis not present

## 2019-02-25 DIAGNOSIS — D509 Iron deficiency anemia, unspecified: Secondary | ICD-10-CM | POA: Diagnosis not present

## 2019-02-25 DIAGNOSIS — Z79899 Other long term (current) drug therapy: Secondary | ICD-10-CM | POA: Diagnosis not present

## 2019-02-25 DIAGNOSIS — R82994 Hypercalciuria: Secondary | ICD-10-CM | POA: Diagnosis not present

## 2019-02-25 DIAGNOSIS — N186 End stage renal disease: Secondary | ICD-10-CM | POA: Diagnosis not present

## 2019-02-25 DIAGNOSIS — E440 Moderate protein-calorie malnutrition: Secondary | ICD-10-CM | POA: Diagnosis not present

## 2019-02-25 DIAGNOSIS — Z992 Dependence on renal dialysis: Secondary | ICD-10-CM | POA: Diagnosis not present

## 2019-02-26 DIAGNOSIS — N2581 Secondary hyperparathyroidism of renal origin: Secondary | ICD-10-CM | POA: Diagnosis not present

## 2019-02-26 DIAGNOSIS — E440 Moderate protein-calorie malnutrition: Secondary | ICD-10-CM | POA: Diagnosis not present

## 2019-02-26 DIAGNOSIS — Z79899 Other long term (current) drug therapy: Secondary | ICD-10-CM | POA: Diagnosis not present

## 2019-02-26 DIAGNOSIS — D509 Iron deficiency anemia, unspecified: Secondary | ICD-10-CM | POA: Diagnosis not present

## 2019-02-26 DIAGNOSIS — N186 End stage renal disease: Secondary | ICD-10-CM | POA: Diagnosis not present

## 2019-02-26 DIAGNOSIS — Z992 Dependence on renal dialysis: Secondary | ICD-10-CM | POA: Diagnosis not present

## 2019-02-27 DIAGNOSIS — D509 Iron deficiency anemia, unspecified: Secondary | ICD-10-CM | POA: Diagnosis not present

## 2019-02-27 DIAGNOSIS — N2581 Secondary hyperparathyroidism of renal origin: Secondary | ICD-10-CM | POA: Diagnosis not present

## 2019-02-27 DIAGNOSIS — E440 Moderate protein-calorie malnutrition: Secondary | ICD-10-CM | POA: Diagnosis not present

## 2019-02-27 DIAGNOSIS — Z79899 Other long term (current) drug therapy: Secondary | ICD-10-CM | POA: Diagnosis not present

## 2019-02-27 DIAGNOSIS — N186 End stage renal disease: Secondary | ICD-10-CM | POA: Diagnosis not present

## 2019-02-27 DIAGNOSIS — Z992 Dependence on renal dialysis: Secondary | ICD-10-CM | POA: Diagnosis not present

## 2019-02-28 DIAGNOSIS — Z7982 Long term (current) use of aspirin: Secondary | ICD-10-CM | POA: Diagnosis not present

## 2019-02-28 DIAGNOSIS — I10 Essential (primary) hypertension: Secondary | ICD-10-CM | POA: Diagnosis not present

## 2019-02-28 DIAGNOSIS — Z992 Dependence on renal dialysis: Secondary | ICD-10-CM | POA: Diagnosis not present

## 2019-02-28 DIAGNOSIS — Z833 Family history of diabetes mellitus: Secondary | ICD-10-CM | POA: Diagnosis not present

## 2019-02-28 DIAGNOSIS — I120 Hypertensive chronic kidney disease with stage 5 chronic kidney disease or end stage renal disease: Secondary | ICD-10-CM | POA: Diagnosis not present

## 2019-02-28 DIAGNOSIS — F419 Anxiety disorder, unspecified: Secondary | ICD-10-CM | POA: Diagnosis present

## 2019-02-28 DIAGNOSIS — Z79899 Other long term (current) drug therapy: Secondary | ICD-10-CM | POA: Diagnosis not present

## 2019-02-28 DIAGNOSIS — J810 Acute pulmonary edema: Secondary | ICD-10-CM | POA: Diagnosis not present

## 2019-02-28 DIAGNOSIS — R9431 Abnormal electrocardiogram [ECG] [EKG]: Secondary | ICD-10-CM | POA: Diagnosis not present

## 2019-02-28 DIAGNOSIS — Z4822 Encounter for aftercare following kidney transplant: Secondary | ICD-10-CM | POA: Diagnosis not present

## 2019-02-28 DIAGNOSIS — Z809 Family history of malignant neoplasm, unspecified: Secondary | ICD-10-CM | POA: Diagnosis not present

## 2019-02-28 DIAGNOSIS — E1121 Type 2 diabetes mellitus with diabetic nephropathy: Secondary | ICD-10-CM | POA: Diagnosis not present

## 2019-02-28 DIAGNOSIS — E1122 Type 2 diabetes mellitus with diabetic chronic kidney disease: Secondary | ICD-10-CM | POA: Diagnosis not present

## 2019-02-28 DIAGNOSIS — Z4902 Encounter for fitting and adjustment of peritoneal dialysis catheter: Secondary | ICD-10-CM | POA: Diagnosis not present

## 2019-02-28 DIAGNOSIS — J322 Chronic ethmoidal sinusitis: Secondary | ICD-10-CM | POA: Diagnosis present

## 2019-02-28 DIAGNOSIS — Z20828 Contact with and (suspected) exposure to other viral communicable diseases: Secondary | ICD-10-CM | POA: Diagnosis present

## 2019-02-28 DIAGNOSIS — N186 End stage renal disease: Secondary | ICD-10-CM | POA: Diagnosis not present

## 2019-02-28 DIAGNOSIS — Z940 Kidney transplant status: Secondary | ICD-10-CM | POA: Diagnosis not present

## 2019-02-28 DIAGNOSIS — D631 Anemia in chronic kidney disease: Secondary | ICD-10-CM | POA: Diagnosis not present

## 2019-02-28 DIAGNOSIS — K4090 Unilateral inguinal hernia, without obstruction or gangrene, not specified as recurrent: Secondary | ICD-10-CM | POA: Diagnosis present

## 2019-02-28 DIAGNOSIS — E1165 Type 2 diabetes mellitus with hyperglycemia: Secondary | ICD-10-CM | POA: Diagnosis not present

## 2019-02-28 DIAGNOSIS — Z5181 Encounter for therapeutic drug level monitoring: Secondary | ICD-10-CM | POA: Diagnosis not present

## 2019-02-28 DIAGNOSIS — Z01818 Encounter for other preprocedural examination: Secondary | ICD-10-CM | POA: Diagnosis not present

## 2019-02-28 DIAGNOSIS — D62 Acute posthemorrhagic anemia: Secondary | ICD-10-CM | POA: Diagnosis not present

## 2019-02-28 DIAGNOSIS — Z8249 Family history of ischemic heart disease and other diseases of the circulatory system: Secondary | ICD-10-CM | POA: Diagnosis not present

## 2019-02-28 DIAGNOSIS — B182 Chronic viral hepatitis C: Secondary | ICD-10-CM | POA: Diagnosis present

## 2019-02-28 DIAGNOSIS — Z794 Long term (current) use of insulin: Secondary | ICD-10-CM | POA: Diagnosis not present

## 2019-02-28 DIAGNOSIS — E785 Hyperlipidemia, unspecified: Secondary | ICD-10-CM | POA: Diagnosis present

## 2019-02-28 DIAGNOSIS — E119 Type 2 diabetes mellitus without complications: Secondary | ICD-10-CM | POA: Diagnosis not present

## 2019-03-06 DIAGNOSIS — D649 Anemia, unspecified: Secondary | ICD-10-CM | POA: Diagnosis not present

## 2019-03-06 DIAGNOSIS — N2581 Secondary hyperparathyroidism of renal origin: Secondary | ICD-10-CM | POA: Diagnosis not present

## 2019-03-06 DIAGNOSIS — Z940 Kidney transplant status: Secondary | ICD-10-CM | POA: Diagnosis not present

## 2019-03-06 DIAGNOSIS — E119 Type 2 diabetes mellitus without complications: Secondary | ICD-10-CM | POA: Diagnosis not present

## 2019-03-06 DIAGNOSIS — E1121 Type 2 diabetes mellitus with diabetic nephropathy: Secondary | ICD-10-CM | POA: Diagnosis not present

## 2019-03-06 DIAGNOSIS — Z79899 Other long term (current) drug therapy: Secondary | ICD-10-CM | POA: Diagnosis not present

## 2019-03-06 DIAGNOSIS — Z5181 Encounter for therapeutic drug level monitoring: Secondary | ICD-10-CM | POA: Diagnosis not present

## 2019-03-06 DIAGNOSIS — Z792 Long term (current) use of antibiotics: Secondary | ICD-10-CM | POA: Diagnosis not present

## 2019-03-06 DIAGNOSIS — Z7952 Long term (current) use of systemic steroids: Secondary | ICD-10-CM | POA: Diagnosis not present

## 2019-03-06 DIAGNOSIS — Z9889 Other specified postprocedural states: Secondary | ICD-10-CM | POA: Diagnosis not present

## 2019-03-06 DIAGNOSIS — Z794 Long term (current) use of insulin: Secondary | ICD-10-CM | POA: Diagnosis not present

## 2019-03-06 DIAGNOSIS — D899 Disorder involving the immune mechanism, unspecified: Secondary | ICD-10-CM | POA: Diagnosis not present

## 2019-03-06 DIAGNOSIS — E785 Hyperlipidemia, unspecified: Secondary | ICD-10-CM | POA: Diagnosis not present

## 2019-03-06 DIAGNOSIS — Z4822 Encounter for aftercare following kidney transplant: Secondary | ICD-10-CM | POA: Diagnosis not present

## 2019-03-06 DIAGNOSIS — I10 Essential (primary) hypertension: Secondary | ICD-10-CM | POA: Diagnosis not present

## 2019-03-10 ENCOUNTER — Ambulatory Visit: Payer: Medicare Other | Admitting: Gastroenterology

## 2019-03-10 DIAGNOSIS — E119 Type 2 diabetes mellitus without complications: Secondary | ICD-10-CM | POA: Diagnosis not present

## 2019-03-10 DIAGNOSIS — I10 Essential (primary) hypertension: Secondary | ICD-10-CM | POA: Diagnosis not present

## 2019-03-10 DIAGNOSIS — Z7952 Long term (current) use of systemic steroids: Secondary | ICD-10-CM | POA: Diagnosis not present

## 2019-03-10 DIAGNOSIS — Z79899 Other long term (current) drug therapy: Secondary | ICD-10-CM | POA: Diagnosis not present

## 2019-03-10 DIAGNOSIS — D899 Disorder involving the immune mechanism, unspecified: Secondary | ICD-10-CM | POA: Diagnosis not present

## 2019-03-10 DIAGNOSIS — Z794 Long term (current) use of insulin: Secondary | ICD-10-CM | POA: Diagnosis not present

## 2019-03-10 DIAGNOSIS — N2581 Secondary hyperparathyroidism of renal origin: Secondary | ICD-10-CM | POA: Diagnosis not present

## 2019-03-10 DIAGNOSIS — Z940 Kidney transplant status: Secondary | ICD-10-CM | POA: Diagnosis not present

## 2019-03-10 DIAGNOSIS — Z4822 Encounter for aftercare following kidney transplant: Secondary | ICD-10-CM | POA: Diagnosis not present

## 2019-03-10 DIAGNOSIS — Z792 Long term (current) use of antibiotics: Secondary | ICD-10-CM | POA: Diagnosis not present

## 2019-03-10 DIAGNOSIS — D649 Anemia, unspecified: Secondary | ICD-10-CM | POA: Diagnosis not present

## 2019-03-12 ENCOUNTER — Encounter: Payer: Self-pay | Admitting: Dietician

## 2019-03-12 ENCOUNTER — Encounter: Payer: Medicare Other | Attending: Family | Admitting: Dietician

## 2019-03-12 DIAGNOSIS — N183 Chronic kidney disease, stage 3 (moderate): Secondary | ICD-10-CM | POA: Insufficient documentation

## 2019-03-12 DIAGNOSIS — E119 Type 2 diabetes mellitus without complications: Secondary | ICD-10-CM | POA: Diagnosis not present

## 2019-03-12 DIAGNOSIS — Z794 Long term (current) use of insulin: Secondary | ICD-10-CM | POA: Diagnosis not present

## 2019-03-12 DIAGNOSIS — Z940 Kidney transplant status: Secondary | ICD-10-CM

## 2019-03-12 NOTE — Patient Instructions (Signed)
Take your long acting insulin (Basaglar) daily as directed by your MD. Continue to check your blood sugar. Your calorie needs and protein needs remain very high until 1 month post op/wound healed well.  Be sure to continue to eat well. Avoid skipping meals and continue the Enterex as needed. Continue to be watchful of foods/beverages that make your blood sugar and stomach issues worse.    Continue to eat balanced meals. Resume exercise when your MD states that you can resume this.  Please call for any questions.

## 2019-03-12 NOTE — Progress Notes (Signed)
Medical Nutrition Therapy:  Appt start time: 1110 end time:  0762. This visit was completed via telephone due to the COVID-19 pandemic.   I spoke with Maureen Barnes and verified that I was speaking with the correct person with two patient identifiers (full name and date of birth).   I discussed the limitations related to this kind of visit and the patient is willing to proceed. Patient was at her home and I am at my office.  Daughter was present.  Assessment:  Primary concerns today: Blood sugar Last visit with RD 01/15/2019. Patient s/p kidney transplant 02/28/19 and states that this went well. Her daughter is there caring for her. She has a problem with sleep due to side effects of the medication. BG higher due to prednisone which is being tapered to a lower dose. BG 79 this am and 222 before dinner last night.  She is currently on Basaglar and states that she was told to take this if her BG is >170-180.  She generally takes this in the afternoon. She has continue to have GI issues since the last appointment with increased gas, bloating, and nausea.  She has an appointment to see a gastroenterologist in September.  She is taking IB Guard Peppermint oil which is helping.  She stated that the probiotic that she was taking was making this worse.   She states that she is trying to follow a diabetic diet and was instructed to increase the phosphorous and magnesium in her diet. She is taking Enterex nutritional supplement (similar to Glucerna).  She feels shaky at times and wonders if she is taking too much insulin.  She has an MD appointment tomorrow with the transplant team.  History includes HTN, Hyperlipidemia, type 2 diabetes, and CKD on peritoneal dialysis.  She also has arthritis and history of headaches. Sleep issues noted last visit (09/2018)- wakes for 2 hours in the middle of the night and sleeps for only 4-5 hours per night.  New labs unknown. She does not know her most recent A1C UBW 161-164 lbs  but 177 lbs in March, 2020. (was eating more to help her medication not hurt her stomach).  Patient's son lives with her.  She is doing her own shopping about every 2 weeks.  She reports increased stress due to COVID fears and is working through it.  She is not getting much exercise.  She has thought about buying a treadmill.  She continues to try to follow a low sugar, low salt, low phosphorous diet. 32 ounce fluid restriction. She thinks that her weight has increased some and reports that this is due to overeating and lack of exercise. She is on disability.  She previously worked as a Quarry manager. Enjoys reading, designing and sewing clothes and jewelry, crafts and decorating.  Preferred Learning Style:   No preference indicated   Learning Readiness:   Ready  Change in progress   MEDICATIONS: Basaglar 20 units, Lantus is no longer covered by insurance,    DIETARY INTAKE:  Usual eating pattern includes 3 meals and 1 snacks per day. Avoided foods include red meat, pork.    24-hr recall:  B ( AM): egg whites, spinach, sausage, toast with jam  Snk ( AM):  Enterex nutrition supplement L ( PM): soup, salad, cheese and crackers OR lean cuisine Snk ( PM):  D ( PM): chicken quesadilla, salad OR baked chicken wings, rice, vegetables OR shrimp scampi  Snk ( PM):  Beverages: water, occasional regular gingerale  Usual physical  activity: none post op  Estimated energy needs: 2250-2350 calories until 1 month post op and then enough to maintain weight 50% g carbohydrates 100-110 g protein until 1 month post op and then 75 grams per day <30% g fat  Progress Towards Goal(s):  In progress.   Nutritional Diagnosis:  NB-1.1 Food and nutrition-related knowledge deficit As related to balance of carbohydrate, protein, fat, nutrition post renal transplant.  As evidenced by diet hx and patient report.    Intervention:  Nutrition education related to nutrition post op with recommendations of balance  of carbohydrates and protein to help maintain her BG.  Discussed things such as pain, stress, medications (prednisone) that are currently increasing her BG.  Discussed her insulin use and discussed to talk with her MD about this tomorrow.  Discussed that she should be consistent with timing of Basaglar and to take this daily.  MD may adjust dose.  Discussed her GI issues and that her body is currently undergoing a lot of changes currently post op.  PLAN: Take your long acting insulin (Basaglar) daily as directed by your MD. Continue to check your blood sugar. Your calorie needs and protein needs remain very high until 1 month post op/wound healed well.  Be sure to continue to eat well. Avoid skipping meals and continue the Enterex as needed. Continue to be watchful of foods/beverages that make your blood sugar and stomach issues worse.    Continue to eat balanced meals. Resume exercise when your MD states that you can resume this.  Please call for any questions.  Teaching Method Utilized:  Auditory  Handouts given during visit include:  How to thrive:  A guide for your journey with diabetes  Meal plan card  IBS nutrition therapy from [facility]  Barriers to learning/adherence to lifestyle change: none  Demonstrated degree of understanding via:  Teach Back   Monitoring/Evaluation:  Dietary intake, exercise, and body weight in 1 month(s).

## 2019-03-13 DIAGNOSIS — Z794 Long term (current) use of insulin: Secondary | ICD-10-CM | POA: Diagnosis not present

## 2019-03-13 DIAGNOSIS — N186 End stage renal disease: Secondary | ICD-10-CM | POA: Diagnosis not present

## 2019-03-13 DIAGNOSIS — D899 Disorder involving the immune mechanism, unspecified: Secondary | ICD-10-CM | POA: Diagnosis not present

## 2019-03-13 DIAGNOSIS — I120 Hypertensive chronic kidney disease with stage 5 chronic kidney disease or end stage renal disease: Secondary | ICD-10-CM | POA: Diagnosis not present

## 2019-03-13 DIAGNOSIS — E11649 Type 2 diabetes mellitus with hypoglycemia without coma: Secondary | ICD-10-CM | POA: Diagnosis not present

## 2019-03-13 DIAGNOSIS — Z79899 Other long term (current) drug therapy: Secondary | ICD-10-CM | POA: Diagnosis not present

## 2019-03-13 DIAGNOSIS — D649 Anemia, unspecified: Secondary | ICD-10-CM | POA: Diagnosis not present

## 2019-03-13 DIAGNOSIS — I10 Essential (primary) hypertension: Secondary | ICD-10-CM | POA: Diagnosis not present

## 2019-03-13 DIAGNOSIS — E1121 Type 2 diabetes mellitus with diabetic nephropathy: Secondary | ICD-10-CM | POA: Diagnosis not present

## 2019-03-13 DIAGNOSIS — Z4822 Encounter for aftercare following kidney transplant: Secondary | ICD-10-CM | POA: Diagnosis not present

## 2019-03-13 DIAGNOSIS — Z940 Kidney transplant status: Secondary | ICD-10-CM | POA: Diagnosis not present

## 2019-03-13 DIAGNOSIS — Z5181 Encounter for therapeutic drug level monitoring: Secondary | ICD-10-CM | POA: Diagnosis not present

## 2019-03-13 DIAGNOSIS — D72829 Elevated white blood cell count, unspecified: Secondary | ICD-10-CM | POA: Diagnosis not present

## 2019-03-13 DIAGNOSIS — E1122 Type 2 diabetes mellitus with diabetic chronic kidney disease: Secondary | ICD-10-CM | POA: Diagnosis not present

## 2019-03-13 DIAGNOSIS — E785 Hyperlipidemia, unspecified: Secondary | ICD-10-CM | POA: Diagnosis not present

## 2019-03-13 DIAGNOSIS — N2581 Secondary hyperparathyroidism of renal origin: Secondary | ICD-10-CM | POA: Diagnosis not present

## 2019-03-17 DIAGNOSIS — R109 Unspecified abdominal pain: Secondary | ICD-10-CM | POA: Diagnosis not present

## 2019-03-17 DIAGNOSIS — Z7952 Long term (current) use of systemic steroids: Secondary | ICD-10-CM | POA: Diagnosis not present

## 2019-03-17 DIAGNOSIS — N2581 Secondary hyperparathyroidism of renal origin: Secondary | ICD-10-CM | POA: Diagnosis not present

## 2019-03-17 DIAGNOSIS — F419 Anxiety disorder, unspecified: Secondary | ICD-10-CM | POA: Diagnosis not present

## 2019-03-17 DIAGNOSIS — Z4822 Encounter for aftercare following kidney transplant: Secondary | ICD-10-CM | POA: Diagnosis not present

## 2019-03-17 DIAGNOSIS — Z792 Long term (current) use of antibiotics: Secondary | ICD-10-CM | POA: Diagnosis not present

## 2019-03-17 DIAGNOSIS — Z794 Long term (current) use of insulin: Secondary | ICD-10-CM | POA: Diagnosis not present

## 2019-03-17 DIAGNOSIS — R251 Tremor, unspecified: Secondary | ICD-10-CM | POA: Diagnosis not present

## 2019-03-17 DIAGNOSIS — K3184 Gastroparesis: Secondary | ICD-10-CM | POA: Diagnosis not present

## 2019-03-17 DIAGNOSIS — Z940 Kidney transplant status: Secondary | ICD-10-CM | POA: Diagnosis not present

## 2019-03-17 DIAGNOSIS — E119 Type 2 diabetes mellitus without complications: Secondary | ICD-10-CM | POA: Diagnosis not present

## 2019-03-17 DIAGNOSIS — R1084 Generalized abdominal pain: Secondary | ICD-10-CM | POA: Diagnosis not present

## 2019-03-17 DIAGNOSIS — E785 Hyperlipidemia, unspecified: Secondary | ICD-10-CM | POA: Diagnosis not present

## 2019-03-17 DIAGNOSIS — Z9889 Other specified postprocedural states: Secondary | ICD-10-CM | POA: Diagnosis not present

## 2019-03-17 DIAGNOSIS — E1143 Type 2 diabetes mellitus with diabetic autonomic (poly)neuropathy: Secondary | ICD-10-CM | POA: Diagnosis not present

## 2019-03-17 DIAGNOSIS — M25562 Pain in left knee: Secondary | ICD-10-CM | POA: Diagnosis not present

## 2019-03-17 DIAGNOSIS — R0602 Shortness of breath: Secondary | ICD-10-CM | POA: Diagnosis not present

## 2019-03-17 DIAGNOSIS — M25561 Pain in right knee: Secondary | ICD-10-CM | POA: Diagnosis not present

## 2019-03-17 DIAGNOSIS — Z79899 Other long term (current) drug therapy: Secondary | ICD-10-CM | POA: Diagnosis not present

## 2019-03-17 DIAGNOSIS — Z8719 Personal history of other diseases of the digestive system: Secondary | ICD-10-CM | POA: Diagnosis not present

## 2019-03-17 DIAGNOSIS — R112 Nausea with vomiting, unspecified: Secondary | ICD-10-CM | POA: Diagnosis not present

## 2019-03-17 DIAGNOSIS — I10 Essential (primary) hypertension: Secondary | ICD-10-CM | POA: Diagnosis not present

## 2019-03-17 DIAGNOSIS — R000 Tachycardia, unspecified: Secondary | ICD-10-CM | POA: Diagnosis not present

## 2019-03-17 DIAGNOSIS — D899 Disorder involving the immune mechanism, unspecified: Secondary | ICD-10-CM | POA: Diagnosis not present

## 2019-03-17 DIAGNOSIS — D649 Anemia, unspecified: Secondary | ICD-10-CM | POA: Diagnosis not present

## 2019-03-17 DIAGNOSIS — Z5181 Encounter for therapeutic drug level monitoring: Secondary | ICD-10-CM | POA: Diagnosis not present

## 2019-03-20 DIAGNOSIS — Z4822 Encounter for aftercare following kidney transplant: Secondary | ICD-10-CM | POA: Diagnosis not present

## 2019-03-20 DIAGNOSIS — Z940 Kidney transplant status: Secondary | ICD-10-CM | POA: Diagnosis not present

## 2019-03-20 DIAGNOSIS — D899 Disorder involving the immune mechanism, unspecified: Secondary | ICD-10-CM | POA: Diagnosis not present

## 2019-03-20 DIAGNOSIS — E119 Type 2 diabetes mellitus without complications: Secondary | ICD-10-CM | POA: Diagnosis not present

## 2019-03-20 DIAGNOSIS — Z79899 Other long term (current) drug therapy: Secondary | ICD-10-CM | POA: Diagnosis not present

## 2019-03-20 DIAGNOSIS — Z792 Long term (current) use of antibiotics: Secondary | ICD-10-CM | POA: Diagnosis not present

## 2019-03-20 DIAGNOSIS — I10 Essential (primary) hypertension: Secondary | ICD-10-CM | POA: Diagnosis not present

## 2019-03-20 DIAGNOSIS — K3184 Gastroparesis: Secondary | ICD-10-CM | POA: Diagnosis not present

## 2019-03-20 DIAGNOSIS — E1143 Type 2 diabetes mellitus with diabetic autonomic (poly)neuropathy: Secondary | ICD-10-CM | POA: Diagnosis not present

## 2019-03-24 DIAGNOSIS — Z79899 Other long term (current) drug therapy: Secondary | ICD-10-CM | POA: Diagnosis not present

## 2019-03-24 DIAGNOSIS — K3184 Gastroparesis: Secondary | ICD-10-CM | POA: Diagnosis not present

## 2019-03-24 DIAGNOSIS — R110 Nausea: Secondary | ICD-10-CM | POA: Diagnosis not present

## 2019-03-24 DIAGNOSIS — R109 Unspecified abdominal pain: Secondary | ICD-10-CM | POA: Diagnosis not present

## 2019-03-24 DIAGNOSIS — E1143 Type 2 diabetes mellitus with diabetic autonomic (poly)neuropathy: Secondary | ICD-10-CM | POA: Diagnosis not present

## 2019-03-24 DIAGNOSIS — R12 Heartburn: Secondary | ICD-10-CM | POA: Diagnosis not present

## 2019-03-24 DIAGNOSIS — D899 Disorder involving the immune mechanism, unspecified: Secondary | ICD-10-CM | POA: Diagnosis not present

## 2019-03-24 DIAGNOSIS — E1121 Type 2 diabetes mellitus with diabetic nephropathy: Secondary | ICD-10-CM | POA: Diagnosis not present

## 2019-03-24 DIAGNOSIS — R197 Diarrhea, unspecified: Secondary | ICD-10-CM | POA: Diagnosis not present

## 2019-03-24 DIAGNOSIS — Z940 Kidney transplant status: Secondary | ICD-10-CM | POA: Diagnosis not present

## 2019-03-24 DIAGNOSIS — Z792 Long term (current) use of antibiotics: Secondary | ICD-10-CM | POA: Diagnosis not present

## 2019-03-24 DIAGNOSIS — I10 Essential (primary) hypertension: Secondary | ICD-10-CM | POA: Diagnosis not present

## 2019-03-24 DIAGNOSIS — E785 Hyperlipidemia, unspecified: Secondary | ICD-10-CM | POA: Diagnosis not present

## 2019-03-24 DIAGNOSIS — Z4822 Encounter for aftercare following kidney transplant: Secondary | ICD-10-CM | POA: Diagnosis not present

## 2019-03-24 DIAGNOSIS — Z794 Long term (current) use of insulin: Secondary | ICD-10-CM | POA: Diagnosis not present

## 2019-03-24 DIAGNOSIS — R1084 Generalized abdominal pain: Secondary | ICD-10-CM | POA: Diagnosis not present

## 2019-03-27 DIAGNOSIS — Z940 Kidney transplant status: Secondary | ICD-10-CM | POA: Diagnosis not present

## 2019-03-27 DIAGNOSIS — R110 Nausea: Secondary | ICD-10-CM | POA: Diagnosis not present

## 2019-03-27 DIAGNOSIS — R109 Unspecified abdominal pain: Secondary | ICD-10-CM | POA: Diagnosis not present

## 2019-03-27 DIAGNOSIS — N2889 Other specified disorders of kidney and ureter: Secondary | ICD-10-CM | POA: Diagnosis not present

## 2019-03-27 DIAGNOSIS — D899 Disorder involving the immune mechanism, unspecified: Secondary | ICD-10-CM | POA: Diagnosis not present

## 2019-03-27 DIAGNOSIS — R197 Diarrhea, unspecified: Secondary | ICD-10-CM | POA: Diagnosis not present

## 2019-03-27 DIAGNOSIS — E1121 Type 2 diabetes mellitus with diabetic nephropathy: Secondary | ICD-10-CM | POA: Diagnosis not present

## 2019-03-27 DIAGNOSIS — I10 Essential (primary) hypertension: Secondary | ICD-10-CM | POA: Diagnosis not present

## 2019-03-27 DIAGNOSIS — D649 Anemia, unspecified: Secondary | ICD-10-CM | POA: Diagnosis not present

## 2019-03-27 DIAGNOSIS — Z4822 Encounter for aftercare following kidney transplant: Secondary | ICD-10-CM | POA: Diagnosis not present

## 2019-03-27 DIAGNOSIS — Z79899 Other long term (current) drug therapy: Secondary | ICD-10-CM | POA: Diagnosis not present

## 2019-04-01 DIAGNOSIS — D8989 Other specified disorders involving the immune mechanism, not elsewhere classified: Secondary | ICD-10-CM | POA: Diagnosis not present

## 2019-04-01 DIAGNOSIS — Z7952 Long term (current) use of systemic steroids: Secondary | ICD-10-CM | POA: Diagnosis not present

## 2019-04-01 DIAGNOSIS — Z7984 Long term (current) use of oral hypoglycemic drugs: Secondary | ICD-10-CM | POA: Diagnosis not present

## 2019-04-01 DIAGNOSIS — E212 Other hyperparathyroidism: Secondary | ICD-10-CM | POA: Diagnosis not present

## 2019-04-01 DIAGNOSIS — I10 Essential (primary) hypertension: Secondary | ICD-10-CM | POA: Diagnosis not present

## 2019-04-01 DIAGNOSIS — E785 Hyperlipidemia, unspecified: Secondary | ICD-10-CM | POA: Diagnosis not present

## 2019-04-01 DIAGNOSIS — Z79899 Other long term (current) drug therapy: Secondary | ICD-10-CM | POA: Diagnosis not present

## 2019-04-01 DIAGNOSIS — N2581 Secondary hyperparathyroidism of renal origin: Secondary | ICD-10-CM | POA: Diagnosis not present

## 2019-04-01 DIAGNOSIS — Z4822 Encounter for aftercare following kidney transplant: Secondary | ICD-10-CM | POA: Diagnosis not present

## 2019-04-01 DIAGNOSIS — D649 Anemia, unspecified: Secondary | ICD-10-CM | POA: Diagnosis not present

## 2019-04-01 DIAGNOSIS — Z792 Long term (current) use of antibiotics: Secondary | ICD-10-CM | POA: Diagnosis not present

## 2019-04-01 DIAGNOSIS — Z794 Long term (current) use of insulin: Secondary | ICD-10-CM | POA: Diagnosis not present

## 2019-04-01 DIAGNOSIS — Z940 Kidney transplant status: Secondary | ICD-10-CM | POA: Diagnosis not present

## 2019-04-01 DIAGNOSIS — E119 Type 2 diabetes mellitus without complications: Secondary | ICD-10-CM | POA: Diagnosis not present

## 2019-04-01 DIAGNOSIS — R000 Tachycardia, unspecified: Secondary | ICD-10-CM | POA: Diagnosis not present

## 2019-04-10 DIAGNOSIS — Z5189 Encounter for other specified aftercare: Secondary | ICD-10-CM | POA: Diagnosis not present

## 2019-04-10 DIAGNOSIS — Z79899 Other long term (current) drug therapy: Secondary | ICD-10-CM | POA: Diagnosis not present

## 2019-04-10 DIAGNOSIS — Z792 Long term (current) use of antibiotics: Secondary | ICD-10-CM | POA: Diagnosis not present

## 2019-04-10 DIAGNOSIS — E1121 Type 2 diabetes mellitus with diabetic nephropathy: Secondary | ICD-10-CM | POA: Diagnosis not present

## 2019-04-10 DIAGNOSIS — Z794 Long term (current) use of insulin: Secondary | ICD-10-CM | POA: Diagnosis not present

## 2019-04-10 DIAGNOSIS — D899 Disorder involving the immune mechanism, unspecified: Secondary | ICD-10-CM | POA: Diagnosis not present

## 2019-04-10 DIAGNOSIS — R251 Tremor, unspecified: Secondary | ICD-10-CM | POA: Diagnosis not present

## 2019-04-10 DIAGNOSIS — Z4822 Encounter for aftercare following kidney transplant: Secondary | ICD-10-CM | POA: Diagnosis not present

## 2019-04-10 DIAGNOSIS — Z7952 Long term (current) use of systemic steroids: Secondary | ICD-10-CM | POA: Diagnosis not present

## 2019-04-10 DIAGNOSIS — I10 Essential (primary) hypertension: Secondary | ICD-10-CM | POA: Diagnosis not present

## 2019-04-10 DIAGNOSIS — Z940 Kidney transplant status: Secondary | ICD-10-CM | POA: Diagnosis not present

## 2019-04-13 DIAGNOSIS — R140 Abdominal distension (gaseous): Secondary | ICD-10-CM | POA: Diagnosis not present

## 2019-04-13 DIAGNOSIS — Z940 Kidney transplant status: Secondary | ICD-10-CM | POA: Diagnosis not present

## 2019-04-13 DIAGNOSIS — R110 Nausea: Secondary | ICD-10-CM | POA: Diagnosis not present

## 2019-04-13 DIAGNOSIS — B182 Chronic viral hepatitis C: Secondary | ICD-10-CM | POA: Diagnosis not present

## 2019-04-13 DIAGNOSIS — K582 Mixed irritable bowel syndrome: Secondary | ICD-10-CM | POA: Diagnosis not present

## 2019-04-13 IMAGING — DX DG ABDOMEN 1V
2 series · 2 of 2 positions shown · non-contrast
Comparison: June 10, 2017

CLINICAL DATA: Abdominal pain and swelling

EXAM:
ABDOMEN - 1 VIEW

[abdomen kub (1 of 2)]
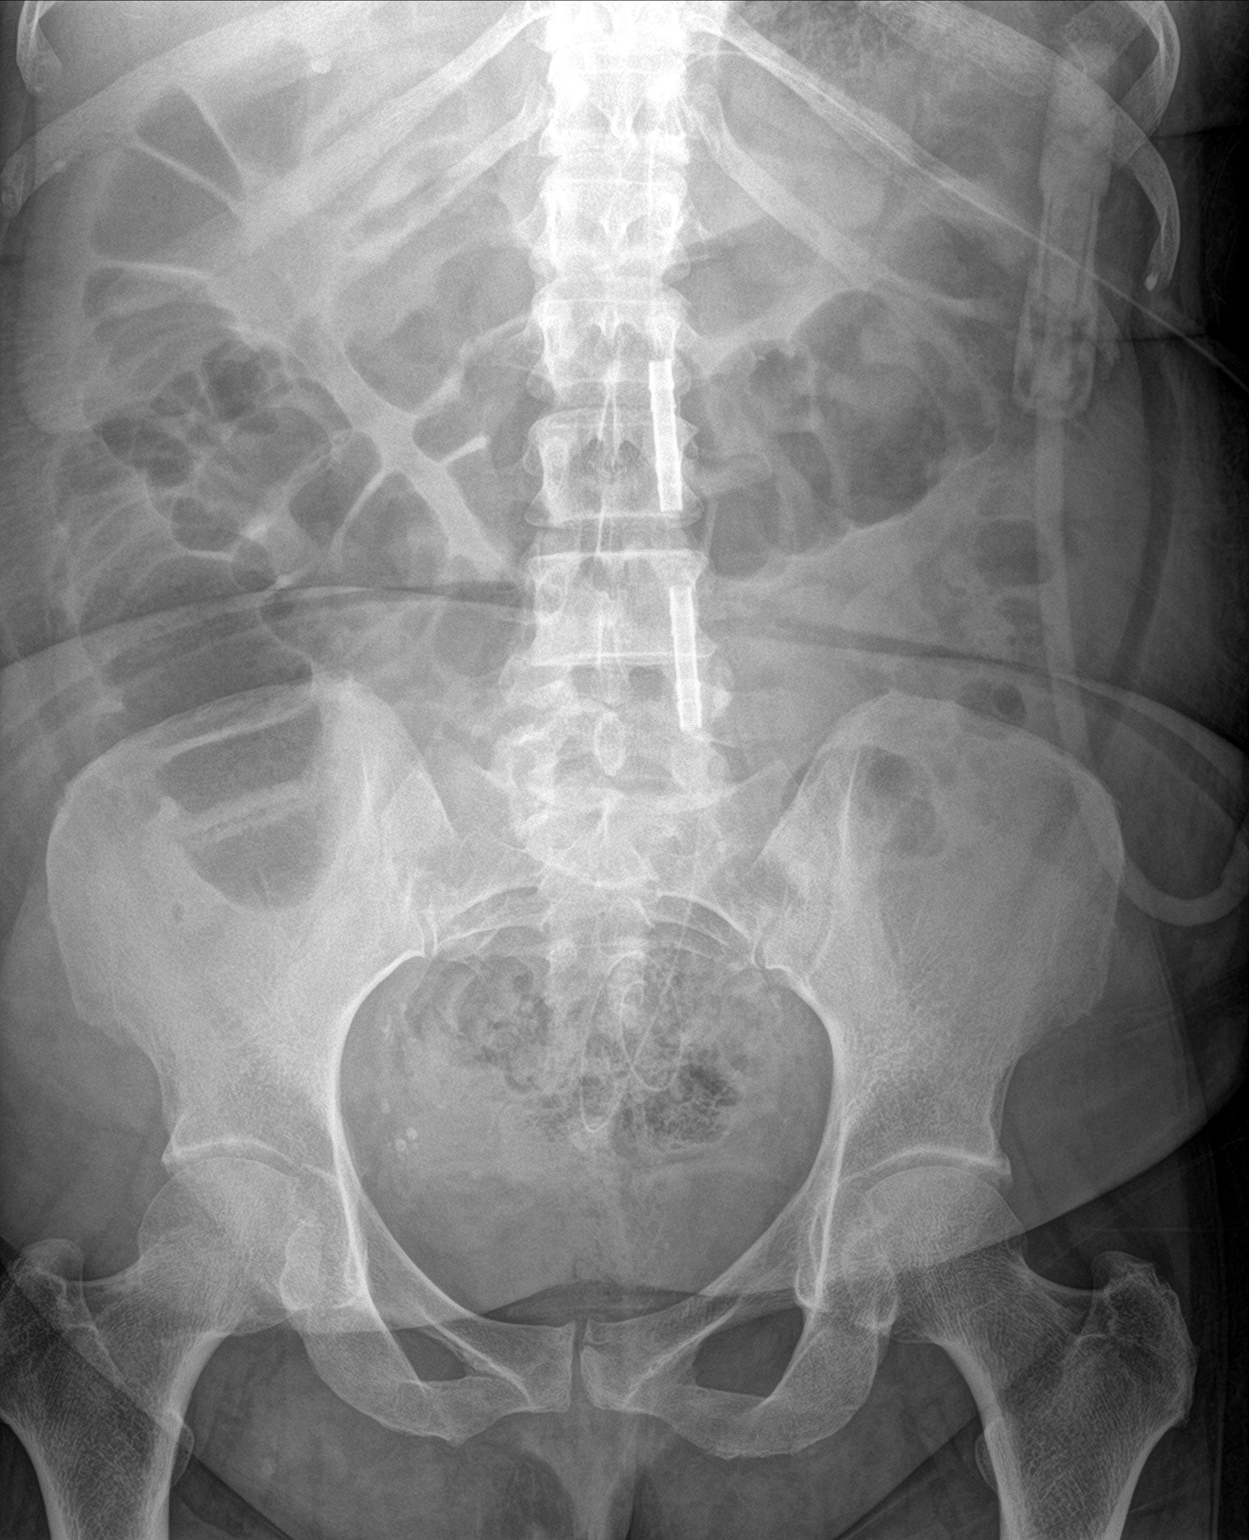

[abdomen kub (2 of 2)]
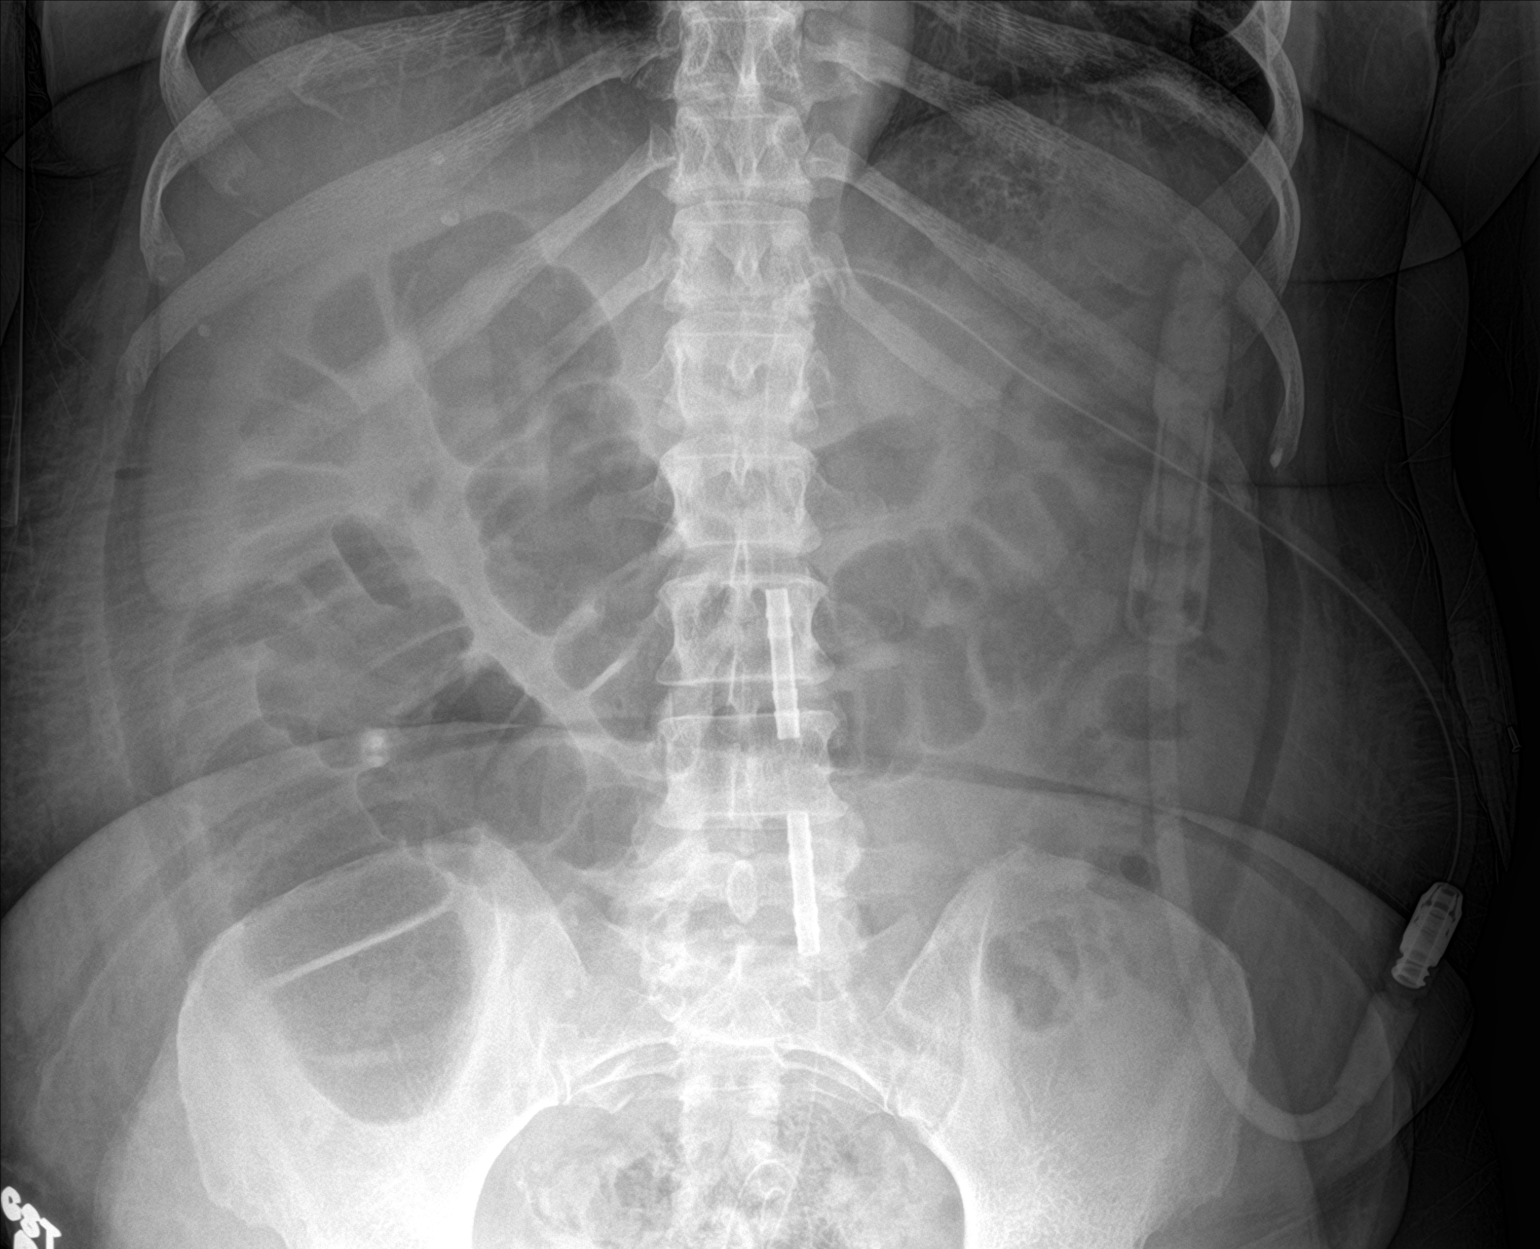

[2 of 2 positions shown; findings below may reference images not displayed]

FINDINGS: There is a peritoneal dialysis catheter with tip in mid pelvis.

There is moderate air in the colon. There is no bowel dilatation or
air-fluid level to suggest bowel obstruction. No free air. There are
phleboliths in the pelvis. Visualized lung bases are clear.
IMPRESSION: Dialysis catheter tip in mid pelvis. No bowel obstruction or free
air evident. Moderate stool in colon.

## 2019-04-16 ENCOUNTER — Ambulatory Visit: Payer: Medicare Other | Admitting: Dietician

## 2019-04-17 DIAGNOSIS — E1121 Type 2 diabetes mellitus with diabetic nephropathy: Secondary | ICD-10-CM | POA: Diagnosis not present

## 2019-04-17 DIAGNOSIS — I10 Essential (primary) hypertension: Secondary | ICD-10-CM | POA: Diagnosis not present

## 2019-04-17 DIAGNOSIS — Z5181 Encounter for therapeutic drug level monitoring: Secondary | ICD-10-CM | POA: Diagnosis not present

## 2019-04-17 DIAGNOSIS — R140 Abdominal distension (gaseous): Secondary | ICD-10-CM | POA: Diagnosis not present

## 2019-04-17 DIAGNOSIS — Z7952 Long term (current) use of systemic steroids: Secondary | ICD-10-CM | POA: Diagnosis not present

## 2019-04-17 DIAGNOSIS — R197 Diarrhea, unspecified: Secondary | ICD-10-CM | POA: Diagnosis not present

## 2019-04-17 DIAGNOSIS — R110 Nausea: Secondary | ICD-10-CM | POA: Diagnosis not present

## 2019-04-17 DIAGNOSIS — Z940 Kidney transplant status: Secondary | ICD-10-CM | POA: Diagnosis not present

## 2019-04-17 DIAGNOSIS — Z794 Long term (current) use of insulin: Secondary | ICD-10-CM | POA: Diagnosis not present

## 2019-04-17 DIAGNOSIS — N2581 Secondary hyperparathyroidism of renal origin: Secondary | ICD-10-CM | POA: Diagnosis not present

## 2019-04-17 DIAGNOSIS — E785 Hyperlipidemia, unspecified: Secondary | ICD-10-CM | POA: Diagnosis not present

## 2019-04-17 DIAGNOSIS — Z4822 Encounter for aftercare following kidney transplant: Secondary | ICD-10-CM | POA: Diagnosis not present

## 2019-04-17 DIAGNOSIS — Z79899 Other long term (current) drug therapy: Secondary | ICD-10-CM | POA: Diagnosis not present

## 2019-04-17 DIAGNOSIS — D899 Disorder involving the immune mechanism, unspecified: Secondary | ICD-10-CM | POA: Diagnosis not present

## 2019-04-17 DIAGNOSIS — Z7984 Long term (current) use of oral hypoglycemic drugs: Secondary | ICD-10-CM | POA: Diagnosis not present

## 2019-04-17 DIAGNOSIS — Z792 Long term (current) use of antibiotics: Secondary | ICD-10-CM | POA: Diagnosis not present

## 2019-04-17 DIAGNOSIS — E119 Type 2 diabetes mellitus without complications: Secondary | ICD-10-CM | POA: Diagnosis not present

## 2019-04-21 ENCOUNTER — Ambulatory Visit: Payer: Medicare Other | Admitting: Gastroenterology

## 2019-04-24 DIAGNOSIS — E785 Hyperlipidemia, unspecified: Secondary | ICD-10-CM | POA: Diagnosis not present

## 2019-04-24 DIAGNOSIS — Z79899 Other long term (current) drug therapy: Secondary | ICD-10-CM | POA: Diagnosis not present

## 2019-04-24 DIAGNOSIS — R110 Nausea: Secondary | ICD-10-CM | POA: Diagnosis not present

## 2019-04-24 DIAGNOSIS — Z940 Kidney transplant status: Secondary | ICD-10-CM | POA: Diagnosis not present

## 2019-04-24 DIAGNOSIS — R140 Abdominal distension (gaseous): Secondary | ICD-10-CM | POA: Diagnosis not present

## 2019-04-24 DIAGNOSIS — E119 Type 2 diabetes mellitus without complications: Secondary | ICD-10-CM | POA: Diagnosis not present

## 2019-04-24 DIAGNOSIS — E1121 Type 2 diabetes mellitus with diabetic nephropathy: Secondary | ICD-10-CM | POA: Diagnosis not present

## 2019-04-24 DIAGNOSIS — I10 Essential (primary) hypertension: Secondary | ICD-10-CM | POA: Diagnosis not present

## 2019-04-24 DIAGNOSIS — D849 Immunodeficiency, unspecified: Secondary | ICD-10-CM | POA: Diagnosis not present

## 2019-04-24 DIAGNOSIS — R000 Tachycardia, unspecified: Secondary | ICD-10-CM | POA: Diagnosis not present

## 2019-04-24 DIAGNOSIS — R5383 Other fatigue: Secondary | ICD-10-CM | POA: Diagnosis not present

## 2019-04-24 DIAGNOSIS — Z4822 Encounter for aftercare following kidney transplant: Secondary | ICD-10-CM | POA: Diagnosis not present

## 2019-04-24 DIAGNOSIS — Z7952 Long term (current) use of systemic steroids: Secondary | ICD-10-CM | POA: Diagnosis not present

## 2019-04-24 DIAGNOSIS — N2581 Secondary hyperparathyroidism of renal origin: Secondary | ICD-10-CM | POA: Diagnosis not present

## 2019-04-24 DIAGNOSIS — R197 Diarrhea, unspecified: Secondary | ICD-10-CM | POA: Diagnosis not present

## 2019-04-24 DIAGNOSIS — Z5181 Encounter for therapeutic drug level monitoring: Secondary | ICD-10-CM | POA: Diagnosis not present

## 2019-04-24 DIAGNOSIS — Z794 Long term (current) use of insulin: Secondary | ICD-10-CM | POA: Diagnosis not present

## 2019-04-24 DIAGNOSIS — Z792 Long term (current) use of antibiotics: Secondary | ICD-10-CM | POA: Diagnosis not present

## 2019-04-24 DIAGNOSIS — Z7984 Long term (current) use of oral hypoglycemic drugs: Secondary | ICD-10-CM | POA: Diagnosis not present

## 2019-05-01 DIAGNOSIS — R140 Abdominal distension (gaseous): Secondary | ICD-10-CM | POA: Diagnosis not present

## 2019-05-01 DIAGNOSIS — N2581 Secondary hyperparathyroidism of renal origin: Secondary | ICD-10-CM | POA: Diagnosis not present

## 2019-05-01 DIAGNOSIS — E119 Type 2 diabetes mellitus without complications: Secondary | ICD-10-CM | POA: Diagnosis not present

## 2019-05-01 DIAGNOSIS — Z794 Long term (current) use of insulin: Secondary | ICD-10-CM | POA: Diagnosis not present

## 2019-05-01 DIAGNOSIS — Z79899 Other long term (current) drug therapy: Secondary | ICD-10-CM | POA: Diagnosis not present

## 2019-05-01 DIAGNOSIS — R197 Diarrhea, unspecified: Secondary | ICD-10-CM | POA: Diagnosis not present

## 2019-05-01 DIAGNOSIS — Z792 Long term (current) use of antibiotics: Secondary | ICD-10-CM | POA: Diagnosis not present

## 2019-05-01 DIAGNOSIS — Z5181 Encounter for therapeutic drug level monitoring: Secondary | ICD-10-CM | POA: Diagnosis not present

## 2019-05-01 DIAGNOSIS — Z4822 Encounter for aftercare following kidney transplant: Secondary | ICD-10-CM | POA: Diagnosis not present

## 2019-05-01 DIAGNOSIS — Z7952 Long term (current) use of systemic steroids: Secondary | ICD-10-CM | POA: Diagnosis not present

## 2019-05-01 DIAGNOSIS — Z940 Kidney transplant status: Secondary | ICD-10-CM | POA: Diagnosis not present

## 2019-05-01 DIAGNOSIS — R109 Unspecified abdominal pain: Secondary | ICD-10-CM | POA: Diagnosis not present

## 2019-05-01 DIAGNOSIS — R110 Nausea: Secondary | ICD-10-CM | POA: Diagnosis not present

## 2019-05-01 DIAGNOSIS — I10 Essential (primary) hypertension: Secondary | ICD-10-CM | POA: Diagnosis not present

## 2019-05-01 DIAGNOSIS — D849 Immunodeficiency, unspecified: Secondary | ICD-10-CM | POA: Diagnosis not present

## 2019-05-01 DIAGNOSIS — E785 Hyperlipidemia, unspecified: Secondary | ICD-10-CM | POA: Diagnosis not present

## 2019-05-01 DIAGNOSIS — R000 Tachycardia, unspecified: Secondary | ICD-10-CM | POA: Diagnosis not present

## 2019-05-01 DIAGNOSIS — R12 Heartburn: Secondary | ICD-10-CM | POA: Diagnosis not present

## 2019-05-07 DIAGNOSIS — Z20828 Contact with and (suspected) exposure to other viral communicable diseases: Secondary | ICD-10-CM | POA: Diagnosis not present

## 2019-05-07 DIAGNOSIS — Z01812 Encounter for preprocedural laboratory examination: Secondary | ICD-10-CM | POA: Diagnosis not present

## 2019-05-07 DIAGNOSIS — Z940 Kidney transplant status: Secondary | ICD-10-CM | POA: Diagnosis not present

## 2019-05-08 ENCOUNTER — Ambulatory Visit: Payer: Medicare Other | Admitting: Dietician

## 2019-05-08 DIAGNOSIS — Z5181 Encounter for therapeutic drug level monitoring: Secondary | ICD-10-CM | POA: Diagnosis not present

## 2019-05-08 DIAGNOSIS — R110 Nausea: Secondary | ICD-10-CM | POA: Diagnosis not present

## 2019-05-08 DIAGNOSIS — D849 Immunodeficiency, unspecified: Secondary | ICD-10-CM | POA: Diagnosis not present

## 2019-05-08 DIAGNOSIS — R197 Diarrhea, unspecified: Secondary | ICD-10-CM | POA: Diagnosis not present

## 2019-05-08 DIAGNOSIS — Z4822 Encounter for aftercare following kidney transplant: Secondary | ICD-10-CM | POA: Diagnosis not present

## 2019-05-08 DIAGNOSIS — Z79899 Other long term (current) drug therapy: Secondary | ICD-10-CM | POA: Diagnosis not present

## 2019-05-08 DIAGNOSIS — E119 Type 2 diabetes mellitus without complications: Secondary | ICD-10-CM | POA: Diagnosis not present

## 2019-05-08 DIAGNOSIS — I10 Essential (primary) hypertension: Secondary | ICD-10-CM | POA: Diagnosis not present

## 2019-05-08 DIAGNOSIS — Z940 Kidney transplant status: Secondary | ICD-10-CM | POA: Diagnosis not present

## 2019-05-08 DIAGNOSIS — D649 Anemia, unspecified: Secondary | ICD-10-CM | POA: Diagnosis not present

## 2019-05-08 DIAGNOSIS — N2581 Secondary hyperparathyroidism of renal origin: Secondary | ICD-10-CM | POA: Diagnosis not present

## 2019-05-12 DIAGNOSIS — R1013 Epigastric pain: Secondary | ICD-10-CM | POA: Diagnosis not present

## 2019-05-12 DIAGNOSIS — R110 Nausea: Secondary | ICD-10-CM | POA: Diagnosis not present

## 2019-05-14 DIAGNOSIS — K2970 Gastritis, unspecified, without bleeding: Secondary | ICD-10-CM | POA: Diagnosis not present

## 2019-05-14 DIAGNOSIS — R110 Nausea: Secondary | ICD-10-CM | POA: Diagnosis not present

## 2019-05-14 DIAGNOSIS — K3184 Gastroparesis: Secondary | ICD-10-CM | POA: Diagnosis not present

## 2019-05-14 DIAGNOSIS — E1143 Type 2 diabetes mellitus with diabetic autonomic (poly)neuropathy: Secondary | ICD-10-CM | POA: Diagnosis not present

## 2019-05-14 DIAGNOSIS — Z794 Long term (current) use of insulin: Secondary | ICD-10-CM | POA: Diagnosis not present

## 2019-05-14 DIAGNOSIS — K2950 Unspecified chronic gastritis without bleeding: Secondary | ICD-10-CM | POA: Diagnosis not present

## 2019-05-22 DIAGNOSIS — Z940 Kidney transplant status: Secondary | ICD-10-CM | POA: Diagnosis not present

## 2019-05-22 DIAGNOSIS — E1143 Type 2 diabetes mellitus with diabetic autonomic (poly)neuropathy: Secondary | ICD-10-CM | POA: Diagnosis not present

## 2019-05-22 DIAGNOSIS — R140 Abdominal distension (gaseous): Secondary | ICD-10-CM | POA: Diagnosis not present

## 2019-05-22 DIAGNOSIS — I10 Essential (primary) hypertension: Secondary | ICD-10-CM | POA: Diagnosis not present

## 2019-05-22 DIAGNOSIS — K3184 Gastroparesis: Secondary | ICD-10-CM | POA: Diagnosis not present

## 2019-05-22 DIAGNOSIS — D849 Immunodeficiency, unspecified: Secondary | ICD-10-CM | POA: Diagnosis not present

## 2019-05-22 DIAGNOSIS — Z4822 Encounter for aftercare following kidney transplant: Secondary | ICD-10-CM | POA: Diagnosis not present

## 2019-05-22 DIAGNOSIS — Z5181 Encounter for therapeutic drug level monitoring: Secondary | ICD-10-CM | POA: Diagnosis not present

## 2019-05-22 DIAGNOSIS — R251 Tremor, unspecified: Secondary | ICD-10-CM | POA: Diagnosis not present

## 2019-05-22 DIAGNOSIS — R109 Unspecified abdominal pain: Secondary | ICD-10-CM | POA: Diagnosis not present

## 2019-05-22 DIAGNOSIS — R110 Nausea: Secondary | ICD-10-CM | POA: Diagnosis not present

## 2019-05-22 DIAGNOSIS — Z79899 Other long term (current) drug therapy: Secondary | ICD-10-CM | POA: Diagnosis not present

## 2019-05-22 DIAGNOSIS — Z794 Long term (current) use of insulin: Secondary | ICD-10-CM | POA: Diagnosis not present

## 2019-05-22 DIAGNOSIS — D649 Anemia, unspecified: Secondary | ICD-10-CM | POA: Diagnosis not present

## 2019-05-22 DIAGNOSIS — N2581 Secondary hyperparathyroidism of renal origin: Secondary | ICD-10-CM | POA: Diagnosis not present

## 2019-05-22 DIAGNOSIS — R000 Tachycardia, unspecified: Secondary | ICD-10-CM | POA: Diagnosis not present

## 2019-05-22 DIAGNOSIS — R197 Diarrhea, unspecified: Secondary | ICD-10-CM | POA: Diagnosis not present

## 2019-05-22 DIAGNOSIS — E1121 Type 2 diabetes mellitus with diabetic nephropathy: Secondary | ICD-10-CM | POA: Diagnosis not present

## 2019-05-22 DIAGNOSIS — Z792 Long term (current) use of antibiotics: Secondary | ICD-10-CM | POA: Diagnosis not present

## 2019-05-22 DIAGNOSIS — Z7952 Long term (current) use of systemic steroids: Secondary | ICD-10-CM | POA: Diagnosis not present

## 2019-05-29 DIAGNOSIS — C642 Malignant neoplasm of left kidney, except renal pelvis: Secondary | ICD-10-CM | POA: Diagnosis not present

## 2019-06-04 ENCOUNTER — Ambulatory Visit: Payer: Medicare Other | Admitting: Dietician

## 2019-06-05 DIAGNOSIS — Z792 Long term (current) use of antibiotics: Secondary | ICD-10-CM | POA: Diagnosis not present

## 2019-06-05 DIAGNOSIS — Z5181 Encounter for therapeutic drug level monitoring: Secondary | ICD-10-CM | POA: Diagnosis not present

## 2019-06-05 DIAGNOSIS — Z7982 Long term (current) use of aspirin: Secondary | ICD-10-CM | POA: Diagnosis not present

## 2019-06-05 DIAGNOSIS — E1165 Type 2 diabetes mellitus with hyperglycemia: Secondary | ICD-10-CM | POA: Diagnosis not present

## 2019-06-05 DIAGNOSIS — Z4822 Encounter for aftercare following kidney transplant: Secondary | ICD-10-CM | POA: Diagnosis not present

## 2019-06-05 DIAGNOSIS — Z7952 Long term (current) use of systemic steroids: Secondary | ICD-10-CM | POA: Diagnosis not present

## 2019-06-05 DIAGNOSIS — R000 Tachycardia, unspecified: Secondary | ICD-10-CM | POA: Diagnosis not present

## 2019-06-05 DIAGNOSIS — D849 Immunodeficiency, unspecified: Secondary | ICD-10-CM | POA: Diagnosis not present

## 2019-06-05 DIAGNOSIS — Z794 Long term (current) use of insulin: Secondary | ICD-10-CM | POA: Diagnosis not present

## 2019-06-05 DIAGNOSIS — I10 Essential (primary) hypertension: Secondary | ICD-10-CM | POA: Diagnosis not present

## 2019-06-05 DIAGNOSIS — E785 Hyperlipidemia, unspecified: Secondary | ICD-10-CM | POA: Diagnosis not present

## 2019-06-05 DIAGNOSIS — Z79899 Other long term (current) drug therapy: Secondary | ICD-10-CM | POA: Diagnosis not present

## 2019-06-05 DIAGNOSIS — R110 Nausea: Secondary | ICD-10-CM | POA: Diagnosis not present

## 2019-06-05 DIAGNOSIS — E1121 Type 2 diabetes mellitus with diabetic nephropathy: Secondary | ICD-10-CM | POA: Diagnosis not present

## 2019-06-05 DIAGNOSIS — Z940 Kidney transplant status: Secondary | ICD-10-CM | POA: Diagnosis not present

## 2019-06-05 DIAGNOSIS — N2581 Secondary hyperparathyroidism of renal origin: Secondary | ICD-10-CM | POA: Diagnosis not present

## 2019-06-05 DIAGNOSIS — D649 Anemia, unspecified: Secondary | ICD-10-CM | POA: Diagnosis not present

## 2019-06-16 DIAGNOSIS — E1121 Type 2 diabetes mellitus with diabetic nephropathy: Secondary | ICD-10-CM | POA: Diagnosis not present

## 2019-06-16 DIAGNOSIS — Z23 Encounter for immunization: Secondary | ICD-10-CM | POA: Diagnosis not present

## 2019-06-16 DIAGNOSIS — B259 Cytomegaloviral disease, unspecified: Secondary | ICD-10-CM | POA: Diagnosis not present

## 2019-06-16 DIAGNOSIS — D849 Immunodeficiency, unspecified: Secondary | ICD-10-CM | POA: Diagnosis not present

## 2019-06-16 DIAGNOSIS — N186 End stage renal disease: Secondary | ICD-10-CM | POA: Diagnosis not present

## 2019-06-16 DIAGNOSIS — Z4822 Encounter for aftercare following kidney transplant: Secondary | ICD-10-CM | POA: Diagnosis not present

## 2019-06-16 DIAGNOSIS — Z79899 Other long term (current) drug therapy: Secondary | ICD-10-CM | POA: Diagnosis not present

## 2019-06-16 DIAGNOSIS — S46002A Unspecified injury of muscle(s) and tendon(s) of the rotator cuff of left shoulder, initial encounter: Secondary | ICD-10-CM | POA: Diagnosis not present

## 2019-06-16 DIAGNOSIS — Z940 Kidney transplant status: Secondary | ICD-10-CM | POA: Diagnosis not present

## 2019-06-16 DIAGNOSIS — R739 Hyperglycemia, unspecified: Secondary | ICD-10-CM | POA: Diagnosis not present

## 2019-06-16 DIAGNOSIS — I120 Hypertensive chronic kidney disease with stage 5 chronic kidney disease or end stage renal disease: Secondary | ICD-10-CM | POA: Diagnosis not present

## 2019-06-16 DIAGNOSIS — Z794 Long term (current) use of insulin: Secondary | ICD-10-CM | POA: Diagnosis not present

## 2019-06-16 DIAGNOSIS — E785 Hyperlipidemia, unspecified: Secondary | ICD-10-CM | POA: Diagnosis not present

## 2019-06-16 DIAGNOSIS — E1122 Type 2 diabetes mellitus with diabetic chronic kidney disease: Secondary | ICD-10-CM | POA: Diagnosis not present

## 2019-06-23 DIAGNOSIS — D849 Immunodeficiency, unspecified: Secondary | ICD-10-CM | POA: Diagnosis not present

## 2019-06-23 DIAGNOSIS — Z794 Long term (current) use of insulin: Secondary | ICD-10-CM | POA: Diagnosis not present

## 2019-06-23 DIAGNOSIS — I10 Essential (primary) hypertension: Secondary | ICD-10-CM | POA: Diagnosis not present

## 2019-06-23 DIAGNOSIS — E1165 Type 2 diabetes mellitus with hyperglycemia: Secondary | ICD-10-CM | POA: Diagnosis not present

## 2019-06-23 DIAGNOSIS — Z940 Kidney transplant status: Secondary | ICD-10-CM | POA: Diagnosis not present

## 2019-07-02 ENCOUNTER — Encounter: Payer: Self-pay | Admitting: Dietician

## 2019-07-02 ENCOUNTER — Other Ambulatory Visit: Payer: Self-pay

## 2019-07-02 ENCOUNTER — Encounter: Payer: Medicare Other | Attending: Internal Medicine | Admitting: Dietician

## 2019-07-02 DIAGNOSIS — E119 Type 2 diabetes mellitus without complications: Secondary | ICD-10-CM | POA: Insufficient documentation

## 2019-07-02 DIAGNOSIS — Z794 Long term (current) use of insulin: Secondary | ICD-10-CM | POA: Diagnosis present

## 2019-07-02 NOTE — Patient Instructions (Addendum)
Continue to take your medication as prescribed.    Be sure to take the additional sliding scale insulin to help your blood sugar decrease.  Continue to stay active.  Aim for 30 minutes per day.   Aim for 2-3 Carb Choices per meal (30-45 grams) +/- 1 either way  Aim for 0-1 Carbs per snack if hungry - limit Include protein in moderation with your meals and snacks Consider reading food labels for Total Carbohydrate of foods Continue checking BG      ed

## 2019-07-02 NOTE — Progress Notes (Signed)
  Medical Nutrition Therapy:  Appt start time: 1500 end time:  1751.   Assessment:  Primary concerns today: Patient is here today alone. Her last appointment with this RD was a televisit 02/2019.   She states that her kidney transplant is going very well.  She is having more problems with with her BG since surgery.  She brought a BG log sheet   She states that she doe snot have IBS and that an endo showed gastritis and another test showed mild gastroparesis.  Type 2 diabetes on insulin since 2012, kidney transplant 02/28/2019, HTN, hyperlipidemia, type 2 diabetes, and history of headaches.  History of sleep problems (wakes for 4-5 hours per night) in March.   Medications include Basaglar 40 units q HS, Novolog 5 units before meals plus sliding scale, metformin, and prednisone Uses a Theressa Stamps  Patient is on disability.  She previously worked as a Quarry manager. Enjoys reading, designing and sewing clothes and jewelry, crafts and decorating.2  Preferred Learning Style:   No preference indicated   Learning Readiness:   Ready  Change in progress   DIETARY INTAKE:  24-hr recall:  B ( AM): cream of wheat sometimes with scrambled egg whites OR egg omelet and vegetables, vegetarian sausage OR avocado, lettuce, cucumber, radishes with egg white Snk ( AM):   L ( PM): fruit smoothie and yogurt OR tuna sandwich on flat bun, rice cake Snk ( PM): peanut butter crackers D ( PM): 2 tacos with ground [place] with onions, lettuce, tomatoes, avocado, cheese Snk ( PM):  Entrex (like Ensure)- 25 g CHO Beverages:  Unsweetened peppermint or turmeric tea with splenda, water, milk, fruit smoothie, Ensure 1 every 2 days., coffee with cream  Usual physical activity: walks, stairs, stretching/movement   Progress Towards Goal(s):  In progress.   Nutritional Diagnosis:  NB-1.1 Food and nutrition-related knowledge deficit As related to balance of carbohydrates, protein, and fat.  As evidenced by diet hx and  patient report.    Intervention:  Nutrition education related to BG management continued.  Reviewed her BG log and provided feedback.  Recommended continued regular meal schedule with balance of protein.  Instructed her to be sure to take the sliding scale insulin for better control of her BG and to stay active.  Plan: Continue to take your medication as prescribed.    Be sure to take the additional sliding scale insulin to help your blood sugar decrease.  Continue to stay active.  Aim for 30 minutes per day Aim for 2-3 Carb Choices per meal (30-45 grams) +/- 1 either way  Aim for 0-1 Carbs per snack if hungry - limit Include protein in moderation with your meals and snacks Consider reading food labels for Total Carbohydrate of foods Continue checking BG  Teaching Method Utilized:  Visual Auditory Hands on  Handouts given during visit include:  Gastroparesis nutrition therapy from [facility]  Yellow meal card  Snack list  Barriers to learning/adherence to lifestyle change: none  Demonstrated degree of understanding via:  Teach Back   Monitoring/Evaluation:  Dietary intake, exercise, and body weight in 2 month(s) via My Chart.

## 2019-07-03 DIAGNOSIS — Z792 Long term (current) use of antibiotics: Secondary | ICD-10-CM | POA: Diagnosis not present

## 2019-07-03 DIAGNOSIS — D849 Immunodeficiency, unspecified: Secondary | ICD-10-CM | POA: Diagnosis not present

## 2019-07-03 DIAGNOSIS — E785 Hyperlipidemia, unspecified: Secondary | ICD-10-CM | POA: Diagnosis not present

## 2019-07-03 DIAGNOSIS — K3184 Gastroparesis: Secondary | ICD-10-CM | POA: Diagnosis not present

## 2019-07-03 DIAGNOSIS — E1165 Type 2 diabetes mellitus with hyperglycemia: Secondary | ICD-10-CM | POA: Diagnosis not present

## 2019-07-03 DIAGNOSIS — N2581 Secondary hyperparathyroidism of renal origin: Secondary | ICD-10-CM | POA: Diagnosis not present

## 2019-07-03 DIAGNOSIS — Z79899 Other long term (current) drug therapy: Secondary | ICD-10-CM | POA: Diagnosis not present

## 2019-07-03 DIAGNOSIS — D649 Anemia, unspecified: Secondary | ICD-10-CM | POA: Diagnosis not present

## 2019-07-03 DIAGNOSIS — Z4822 Encounter for aftercare following kidney transplant: Secondary | ICD-10-CM | POA: Diagnosis not present

## 2019-07-03 DIAGNOSIS — Z9889 Other specified postprocedural states: Secondary | ICD-10-CM | POA: Diagnosis not present

## 2019-07-03 DIAGNOSIS — E1143 Type 2 diabetes mellitus with diabetic autonomic (poly)neuropathy: Secondary | ICD-10-CM | POA: Diagnosis not present

## 2019-07-03 DIAGNOSIS — Z7952 Long term (current) use of systemic steroids: Secondary | ICD-10-CM | POA: Diagnosis not present

## 2019-07-03 DIAGNOSIS — E1121 Type 2 diabetes mellitus with diabetic nephropathy: Secondary | ICD-10-CM | POA: Diagnosis not present

## 2019-07-03 DIAGNOSIS — B259 Cytomegaloviral disease, unspecified: Secondary | ICD-10-CM | POA: Diagnosis not present

## 2019-07-03 DIAGNOSIS — I10 Essential (primary) hypertension: Secondary | ICD-10-CM | POA: Diagnosis not present

## 2019-07-03 DIAGNOSIS — Z940 Kidney transplant status: Secondary | ICD-10-CM | POA: Diagnosis not present

## 2019-07-03 DIAGNOSIS — Z794 Long term (current) use of insulin: Secondary | ICD-10-CM | POA: Diagnosis not present

## 2019-08-27 ENCOUNTER — Ambulatory Visit: Payer: Medicare Other | Admitting: Dietician

## 2019-10-14 ENCOUNTER — Other Ambulatory Visit: Payer: Self-pay | Admitting: Internal Medicine

## 2019-10-14 DIAGNOSIS — Z1231 Encounter for screening mammogram for malignant neoplasm of breast: Secondary | ICD-10-CM

## 2019-12-08 ENCOUNTER — Other Ambulatory Visit: Payer: Self-pay

## 2019-12-08 ENCOUNTER — Ambulatory Visit
Admission: RE | Admit: 2019-12-08 | Discharge: 2019-12-08 | Disposition: A | Discharge status: Home / Self Care | Payer: Medicare Other | Source: Ambulatory Visit | Attending: Internal Medicine | Admitting: Internal Medicine

## 2019-12-08 DIAGNOSIS — Z1231 Encounter for screening mammogram for malignant neoplasm of breast: Secondary | ICD-10-CM

## 2019-12-21 ENCOUNTER — Other Ambulatory Visit: Payer: Self-pay

## 2019-12-21 ENCOUNTER — Ambulatory Visit (HOSPITAL_COMMUNITY)
Admission: EM | Admit: 2019-12-21 | Discharge: 2019-12-21 | Disposition: A | Discharge status: Home / Self Care | Payer: Medicare Other

## 2020-05-26 ENCOUNTER — Ambulatory Visit (INDEPENDENT_AMBULATORY_CARE_PROVIDER_SITE_OTHER): Payer: Medicare Other

## 2020-05-26 ENCOUNTER — Ambulatory Visit (INDEPENDENT_AMBULATORY_CARE_PROVIDER_SITE_OTHER): Payer: Medicare Other | Admitting: Podiatry

## 2020-05-26 ENCOUNTER — Other Ambulatory Visit: Payer: Self-pay

## 2020-05-26 ENCOUNTER — Ambulatory Visit: Payer: Medicare Other

## 2020-05-26 DIAGNOSIS — M76822 Posterior tibial tendinitis, left leg: Secondary | ICD-10-CM

## 2020-05-26 DIAGNOSIS — M2141 Flat foot [pes planus] (acquired), right foot: Secondary | ICD-10-CM | POA: Diagnosis not present

## 2020-05-26 DIAGNOSIS — M79672 Pain in left foot: Secondary | ICD-10-CM | POA: Diagnosis not present

## 2020-05-26 DIAGNOSIS — M216X2 Other acquired deformities of left foot: Secondary | ICD-10-CM | POA: Diagnosis not present

## 2020-05-26 DIAGNOSIS — M21862 Other specified acquired deformities of left lower leg: Secondary | ICD-10-CM

## 2020-05-26 DIAGNOSIS — M2142 Flat foot [pes planus] (acquired), left foot: Secondary | ICD-10-CM

## 2020-05-26 NOTE — Progress Notes (Signed)
  Subjective:  Patient ID: Maureen Barnes, female    DOB: 1961-02-09,  MRN: [id number]  Chief Complaint  Patient presents with  . Foot Pain    left pain in the heel. It hurts when pressure is applied    59 y.o. female presents with the above complaint. History confirmed with patient. Present for a few months, worsening over last few weeks. Feels like pulling and swelling on inside of arch and into ankle.  Objective:  Physical Exam: warm, good capillary refill, no trophic changes or ulcerative lesions, normal DP and PT pulses and normal sensory exam. Left Foot: pain along PT tendon in retromalleolar area and towards navicular, not tender at insertion. Unable to do single heel rise, able to do double   Radiographs: X-ray of the left foot: no fracture, dislocation, swelling or degenerative changes noted , pes planus noted Assessment:   1. Foot pain, left   2. Posterior tibial tendinitis of left lower extremity   3. Pes planus of both feet   4. Gastrocnemius equinus of left lower extremity      Plan:  Patient was evaluated and treated and all questions answered.   Discussed the etiology and treatment options for posterior tibial tendinitis and pes planus including stretching, formal physical therapy, supportive shoegears such as a running shoe or sneaker, pre fabricated orthoses,  and oral medications. We also discussed the role of surgical treatment of this for patients who do not improve after exhausting non-surgical treatment options.    -X-rays reviewed as above -Dispensed Tri-Lock ankle brace.  Patient educated on use  -Recommend stretching and icing, given to her.  -Referral to PT sent   No follow-ups on file.

## 2020-05-26 NOTE — Patient Instructions (Signed)
Look for Voltaren gel at the pharmacy over the counter or online (also known as diclofenac 1% gel). Apply to the painful areas 3-4x daily with the supplied dosing card. Allow to dry for 10 minutes before going into socks/shoes    Posterior Tibial Tendinitis  Posterior tibial tendinitis is irritation of a tendon called the posterior tibial tendon. Your posterior tibial tendon is a cord-like tissue that connects bones of your lower leg and foot to a muscle that: 1. Supports your arch. 2. Helps you raise up on your toes. 3. Helps you turn your foot down and in. This condition causes foot and ankle pain. It can also lead to a flat foot. What are the causes? This condition is most often caused by repeated stress to the tendon (overuse injury). It can also be caused by a sudden injury that stresses the tendon, such as landing on your foot after jumping or falling. What increases the risk? This condition is more likely to develop in: 1. People who play a sport that involves putting a lot of pressure on the feet, such as: 1. Basketball. 2. Tennis. 3. Soccer. 4. Hockey. 2. Runners. 3. Females who are older than 58 years of age and are overweight. 4. People with diabetes. 5. People with decreased foot stability. 6. People with flat feet. What are the signs or symptoms? Symptoms include: 1. Pain in the inner ankle. 2. Pain at the arch of your foot. 3. Pain that gets worse with running, walking, or standing. 4. Swelling on the inside of your ankle and foot. 5. Weakness in your ankle or foot. 6. Inability to stand up on tiptoe. 7. Flattening of the arch of your foot. How is this diagnosed? This condition may be diagnosed based on: 1. Your symptoms. 2. Your medical history. 3. A physical exam. 4. Tests, such as: 1. X-ray. 2. MRI. 3. Ultrasound. How is this treated? This condition may be treated by: 1. Putting ice to the injured area. 2. Taking NSAIDs, such as ibuprofen, to reduce pain  and swelling. 3. Wearing a special shoe or shoe insert to support your arch (orthotic). 4. Having physical therapy. 5. Replacing high-impact exercise with low-impact exercise, such as swimming or cycling. If your symptoms do not improve with these treatments, you may need to wear a splint, removable walking boot, or short leg cast for 6-8 weeks to keep your foot and ankle still (immobilized). Follow these instructions at home: If you have a cast, splint, or boot:  Keep it clean and dry.  Check the skin around it every day. Tell your health care provider about any concerns. If you have a cast:  Do not stick anything inside it to scratch your skin. Doing that increases your risk of infection.  You may put lotion on dry skin around the edges of the cast. Do not put lotion on the skin underneath the cast. If you have a splint or boot:  Wear it as told by your health care provider. Remove it only as told by your health care provider.  Loosen it if your toes tingle, become numb, or turn cold and blue. Bathing 1. Do not take baths, swim, or use a hot tub until your health care provider approves. Ask your health care provider if you may take showers. 2. If your cast, splint, or boot is not waterproof: ? Do not let it get wet. ? Cover it with a waterproof covering while you take a bath or a shower. Managing pain and   swelling   1. If directed, put ice on the injured area. ? If you have a removable splint or boot, remove it as told by your health care provider. ? Put ice in a plastic bag. ? Place a towel between your skin and the bag or between your cast and the bag. ? Leave the ice on for 20 minutes, 2-3 times a day. 2. Move your toes often to reduce stiffness and swelling. 3. Raise (elevate) the injured area above the level of your heart while you are sitting or lying down. Activity  Do not use the injured foot to support your body weight until your health care provider says that you  can. Use crutches as told by your health care provider.  Do not do activities that make pain or swelling worse.  Ask your health care provider when it is safe to drive if you have a cast, splint, or boot on your foot.  Return to your normal activities as told by your health care provider. Ask your health care provider what activities are safe for you.  Do exercises as told by your health care provider. General instructions  Take over-the-counter and prescription medicines only as told by your health care provider.  If you have an orthotic, use it as told by your health care provider.  Keep all follow-up visits as told by your health care provider. This is important. How is this prevented?  Wear footwear that is appropriate to your athletic activity.  Avoid athletic activities that cause pain or swelling in your ankle or foot.  Before being active, do range-of-motion and stretching exercises.  If you develop pain or swelling while training, stop training.  If you have pain or swelling that does not improve after a few days of rest, see your health care provider.  If you start a new athletic activity, start gradually so you can build up your strength and flexibility. Contact a health care provider if:  Your symptoms get worse.  Your symptoms do not improve in 6-8 weeks.  You develop new, unexplained symptoms.  Your splint, boot, or cast gets damaged. Summary  Posterior tibial tendinitis is irritation of a tendon called the posterior tibial tendon.  This condition is most often caused by repeated stress to the tendon (overuse injury).  This condition causes foot pain and ankle pain. It can also lead to a flat foot.  This condition may be treated by not doing high-impact activities, applying ice, having physical therapy, wearing orthotics, and wearing a cast, splint, or boot if needed. This information is not intended to replace advice given to you by your health care  provider. Make sure you discuss any questions you have with your health care provider. Document Revised: 11/04/2018 Document Reviewed: 09/11/2018 Elsevier Patient Education  2020 [employer]  Posterior Tibial Tendinitis Rehab Ask your health care provider which exercises are safe for you. Do exercises exactly as told by your health care provider and adjust them as directed. It is normal to feel mild stretching, pulling, tightness, or discomfort as you do these exercises. Stop right away if you feel sudden pain or your pain gets worse. Do not begin these exercises until told by your health care provider. Stretching and range-of-motion exercises These exercises warm up your muscles and joints and improve the movement and flexibility in your ankle and foot. These exercises may also help to relieve pain. Standing wall calf stretch, knee straight   4. Stand with your hands against a wall.   5. Extend your left / right leg behind you, and bend your front knee slightly. If directed, place a folded washcloth under the arch of your foot for support. 6. Point the toes of your back foot slightly inward. 7. Keeping your heels on the floor and your back knee straight, shift your weight toward the wall. Do not allow your back to arch. You should feel a gentle stretch in your upper left / right calf. 8. Hold this position for 10 seconds. Repeat 10 times. Complete this exercise 2 times a day. Standing wall calf stretch, knee bent 7. Stand with your hands against a wall. 8. Extend your left / right leg behind you, and bend your front knee slightly. If directed, place a folded washcloth under the arch of your foot for support. 9. Point the toes of your back foot slightly inward. 10. Unlock your back knee so it is bent. Keep your heels on the floor. You should feel a gentle stretch deep in your lower left / right calf. 11. Hold this position for 10 seconds. Repeat 10 times. Complete this exercise 2 times a  day. Strengthening exercises These exercises build strength and endurance in your ankle and foot. Endurance is the ability to use your muscles for a long time, even after they get tired. Ankle inversion with band 8. Secure one end of a rubber exercise band or tubing to a fixed object, such as a table leg or a pole, that will stay still when the band is pulled. 9. Loop the other end of the band around the middle of your left / right foot. 10. Sit on the floor facing the object with your left / right leg extended. The band or tube should be slightly tense when your foot is relaxed. 11. Leading with your big toe, slowly bring your left / right foot and ankle inward, toward your other foot (inversion). 12. Hold this position for 10 seconds. 13. Slowly return your foot to the starting position. Repeat 10 times. Complete this exercise 2 times a day. Towel curls   5. Sit in a chair on a non-carpeted surface, and put your feet on the floor. 6. Place a towel in front of your feet. 7. Keeping your heel on the floor, put your left / right foot on the towel. 8. Pull the towel toward you by grabbing the towel with your toes and curling them under. Keep your heel on the floor while you do this. 9. Let your toes relax. 10. Grab the towel with your toes again. Keep going until the towel is completely underneath your foot. Repeat 10 times. Complete this exercise 2 times a day. Balance exercise This exercise improves or maintains your balance. Balance is important in preventing falls. Single leg stand 6. Without wearing shoes, stand near a railing or in a doorway. You may hold on to the railing or door frame as needed for balance. 7. Stand on your left / right foot. Keep your big toe down on the floor and try to keep your arch lifted. ? If balancing in this position is too easy, try the exercise with your eyes closed or while standing on a pillow. 8. Hold this position for 10 seconds. Repeat 10 times.  Complete this exercise 2 times a day. This information is not intended to replace advice given to you by your health care provider. Make sure you discuss any questions you have with your health care provider.  

## 2020-05-29 ENCOUNTER — Encounter: Payer: Self-pay | Admitting: Podiatry

## 2020-05-30 ENCOUNTER — Telehealth: Payer: Self-pay | Admitting: Podiatry

## 2020-05-30 NOTE — Telephone Encounter (Signed)
Dr.Husain office called stated patient is no longer being seen at practice and has transferred [facility]. No longer patient at practice and requested the office notes now be sent over to [facility]

## 2020-06-07 ENCOUNTER — Other Ambulatory Visit: Payer: Self-pay | Admitting: Podiatry

## 2020-06-07 DIAGNOSIS — M76822 Posterior tibial tendinitis, left leg: Secondary | ICD-10-CM

## 2020-06-25 ENCOUNTER — Encounter: Payer: Self-pay | Admitting: Rehabilitative and Restorative Service Providers"

## 2020-06-25 ENCOUNTER — Other Ambulatory Visit: Payer: Self-pay

## 2020-06-25 ENCOUNTER — Ambulatory Visit: Payer: Medicare Other | Attending: Podiatry | Admitting: Rehabilitative and Restorative Service Providers"

## 2020-06-25 DIAGNOSIS — M79672 Pain in left foot: Secondary | ICD-10-CM | POA: Diagnosis present

## 2020-06-25 DIAGNOSIS — R2689 Other abnormalities of gait and mobility: Secondary | ICD-10-CM | POA: Diagnosis present

## 2020-06-25 DIAGNOSIS — M6281 Muscle weakness (generalized): Secondary | ICD-10-CM | POA: Diagnosis present

## 2020-06-25 NOTE — Therapy (Signed)
Maureen Barnes, [place], [address] Phone: [phone]   Fax:  [phone]  Physical Therapy Evaluation  Patient Details  Name: Maureen Barnes MRN: [id number] Date of Birth: 10/06/1960 Referring Provider (PT): Lanae Crumbly, DPM   Encounter Date: 06/25/2020   PT End of Session - 06/25/20 1039    Visit Number 1    Number of Visits 10    Date for PT Re-Evaluation 08/06/20    Authorization Type UHC MCR/MCD    Progress Note Due on Visit 10    PT Start Time 1039    PT Stop Time 5427    PT Time Calculation (min) 52 min    Equipment Utilized During Treatment Other (comment)   pt had L ankle trilock brace issued by MD   Activity Tolerance Patient tolerated treatment well;No increased pain    Behavior During Therapy WFL for tasks assessed/performed           Past Medical History:  Diagnosis Date  . Anemia   . Cervical disc disease   . Chronic kidney disease (CKD), stage V ([place])   . Diabetes mellitus   . Hep C w/o coma, chronic ([place])   . Hypertension   . Lumbar disc disease   . Peritoneal dialysis status ([place])   . Renal disorder     Past Surgical History:  Procedure Laterality Date  . TUBAL LIGATION      There were no vitals filed for this visit.    Subjective Assessment - 06/25/20 1053    Subjective Trilock ankle brace helping some still. Feels like pain underneath and around he heel and around the ankle bone (medially); started in October 2021 just standing at the kitchen counter. Arrived to PT with cane; she said her balance is affected by her meds.    Pertinent History Had a fall about a year ago without injury and pain slipping whereby she landed on her knees. Per MD, L posterior tibial tendinitis, bil ples planus, gastroc equinus of L LE    Limitations Standing;Walking;House hold activities    How long can you sit comfortably? no concerns    How long can you stand comfortably? unknown but a  short period of time <15 min    How long can you walk comfortably? 1 hour    Diagnostic tests Xray demos no abnormalities or fractures    Patient Stated Goals to move around painfree    Currently in Pain? Yes    Pain Score 3     Pain Location Ankle    Pain Orientation Left;Medial    Pain Descriptors / Indicators Aching    Pain Type Chronic pain    Pain Onset More than a month ago    Pain Frequency Intermittent    Aggravating Factors  anything weightbearing    Pain Relieving Factors rest, ointment    Multiple Pain Sites No              [facility] PT Assessment - 06/25/20 0001      Assessment   Medical Diagnosis L posterior tibial tendonitis, bil pes planus, gastroc equinus of L LE    Referring Provider (PT) Lanae Crumbly, DPM    Onset Date/Surgical Date 05/03/20    Hand Dominance Left    Next MD Visit TBD    Prior Therapy none      Precautions   Precaution Comments L kidney transplant 06/09/20      Restrictions   Weight Bearing Restrictions  No      Balance Screen   Has the patient fallen in the past 6 months Yes    How many times? 1    Has the patient had a decrease in activity level because of a fear of falling?  No    Is the patient reluctant to leave their home because of a fear of falling?  No      Home Ecologist residence    Additional Comments has stairs      Prior Function   Level of Independence Independent      Cognition   Overall Cognitive Status Within Functional Limits for tasks assessed      Observation/Other Assessments   Focus on Therapeutic Outcomes (FOTO)  62% limited      Observation/Other Assessments-Edema    Edema --   none present     Sensation   Light Touch Appears Intact      Coordination   Gross Motor Movements are Fluid and Coordinated No      AROM   Overall AROM Comments L AROM inversion 18, eversion 4, PF 49, DF 0. R AROM inversion 10, eversion 20, PF 49, DF -10. Knee AROM bil WNL      PROM    Overall PROM Comments PROM restricted all directions L with c/o pain with DF, in/eversion      Strength   Overall Strength Comments L inversion 1/5, eversion 1/5, PF 4/5, DF 3/5. R inversion 3-/5, eversion 3-/5, PF/DF 4+/5      Palpation   Palpation comment great toes at rest bil extend; great toe extensors bil are tight. Metatarsal mobs L are WNL all except 1-2 stiff. Pt tender to palpation along L posterior tib distally, around medial malleoli and plantar foot. L gastroc tender distally without warmth noted.       Ambulation/Gait   Gait Comments pt ambulates with decreased WB R LE, increased L foot pronation, increased R calcaneal eversion but present bil, tibial bowing out bil, overuse of bil toe extensors 1-5 noted but especially bil great toes to assist with foot clearance; however pt has good hip flexion ROM                      Objective measurements completed on examination: See above findings.               PT Education - 06/25/20 1311    Education Details Educated pt on shoewear (trail vs stability, etc...mesh vs more rigid fabric). Pt is wearing sneakers but sneakers noted to be flexible mesh with more rigid midfoot and very flexible forefoot. Discussed going to [employer] to be assessed and then finding shoes based off of their recommendation. Reviewed PT POC and findings.    Person(s) Educated Patient    Methods Explanation    Comprehension Verbalized understanding            PT Short Term Goals - 06/25/20 1319      PT SHORT TERM GOAL #1   Title Pt will be independent with initial HEP    Baseline did not have time at eval    Time 3    Period Weeks    Status New    Target Date 07/16/20      PT SHORT TERM GOAL #2   Title Pt will report improvement in L LE ankle pain x 25% to assist with improved functional mobility    Baseline up to 7/10  Time 3    Period Weeks    Status New    Target Date 07/16/20             PT Long Term Goals -  06/25/20 1320      PT LONG TERM GOAL #1   Title Pt will be able to walk up and down stairs with 75% less pain    Baseline up to 7-8/10 pain    Time 6    Period Weeks    Status New    Target Date 08/06/20      PT LONG TERM GOAL #2   Title pt will be able to walk x 30 min with </= 3/10 L ankle pain to go shopping    Baseline unable    Time 6    Period Weeks    Status New    Target Date 08/06/20      PT LONG TERM GOAL #3   Title pt will have improved Foto score to 41% limited    Baseline 62% limitation    Time 6    Period Weeks    Status New    Target Date 08/06/20      PT LONG TERM GOAL #4   Title Pt will be able to stand to perform ADLS with 63% less difficulty    Baseline unable without difficulty    Time 6    Period Weeks    Status New    Target Date 08/06/20                  Plan - 06/25/20 1314    Clinical Impression Statement Pt demonstrates abnormality of gait  due to limitation in bil DF and pt is thereby using bil toe extensors to assist with bil foot clearance, bil pes planus L > R, L posterior tibial tendonitis, decreased AROM bil ankles L > R, decreased ankle strength bil. Pt uses a cane. Pt would benefit from PT for L ankle ROM, strength, stability, painrelief modalites and manual therapy to concentrate on L posterior tib, gastroc L, L great toe extensors, and L metatarsal mobs. MD diagnosis L foot pain, posterior tibial tendonitis, bil pes planus, L LE gastroc equinus. consult orthotist as needed.    Personal Factors and Comorbidities Comorbidity 1    Comorbidities Kidney transplant 06/09/20    Examination-Activity Limitations Squat;Stairs;Locomotion Level;Stand    Examination-Participation Restrictions Cleaning;Shop;Community Activity    Stability/Clinical Decision Making Evolving/Moderate complexity    Clinical Decision Making Moderate    Rehab Potential Good    PT Frequency 2x / week    PT Duration 6 weeks   sometimes pt states she can only come  1x/week   PT Treatment/Interventions Cryotherapy;Ultrasound;Moist Heat;Iontophoresis 4mg /ml Dexamethasone;Gait training;Stair training;Functional mobility training;Neuromuscular re-education;Balance training;Therapeutic exercise;Therapeutic activities;Patient/family education;Manual techniques;Passive range of motion;Dry needling;Taping;Joint Manipulations    PT Next Visit Plan Issue HEP for L ankle AROM, toe flexion (marble pickup/towel crunches) due to limitation at time of evaluation; see if patient went to [employer] and had a gait assessment. Do Berg and assess SLS    PT Home Exercise Plan no time to issue at evaluation    Consulted and Agree with Plan of Care Patient           Patient will benefit from skilled therapeutic intervention in order to improve the following deficits and impairments:  Abnormal gait, Decreased endurance, Decreased mobility, Difficulty walking, Hypomobility, Decreased range of motion, Decreased activity tolerance, Decreased strength, Impaired flexibility, Pain  Visit Diagnosis:  Pain in left foot  Muscle weakness (generalized)  Other abnormalities of gait and mobility     Problem List Patient Active Problem List   Diagnosis Date Noted  . Anxiety 07/17/2018  . ESRD on peritoneal dialysis ([place]) 07/17/2018  . H/O cesarean section 07/17/2018  . Evaluation by medical service required 04/02/2018  . Pre-transplant evaluation for end stage renal disease 11/18/2017  . Unilateral inguinal hernia 11/18/2017  . Hyperlipidemia 01/22/2017  . Palpitations 01/22/2017  . Dyspnea on exertion 01/22/2017  . CKD (chronic kidney disease) stage 3, GFR 30-59 ml/min ([facility]) 10/18/2016  . Non-compliance 10/18/2016  . Chronic ethmoidal sinusitis 10/18/2016  . Hepatitis C, chronic ([place]) 05/15/2012  . Diabetes mellitus type 2, insulin dependent ([place]) 05/15/2012  . Hypertension 05/15/2012  . Anemia 05/15/2012    Myra Rude, PT 06/25/2020, 1:33 PM  [facility] [address] [place], [place], [address] Phone: [phone]   Fax:  [phone]  Name: Maureen Barnes MRN: [id number] Date of Birth: [birth year]

## 2020-06-26 IMAGING — MG DIGITAL SCREENING BILATERAL MAMMOGRAM WITH TOMO AND CAD
6 of 12 series · 6 of 36 positions shown · non-contrast
Comparison: Previous exam(s).

CLINICAL DATA: Screening.

EXAM:
DIGITAL SCREENING BILATERAL MAMMOGRAM WITH TOMO AND CAD

[L MLO synth-2D (1 of 2)]
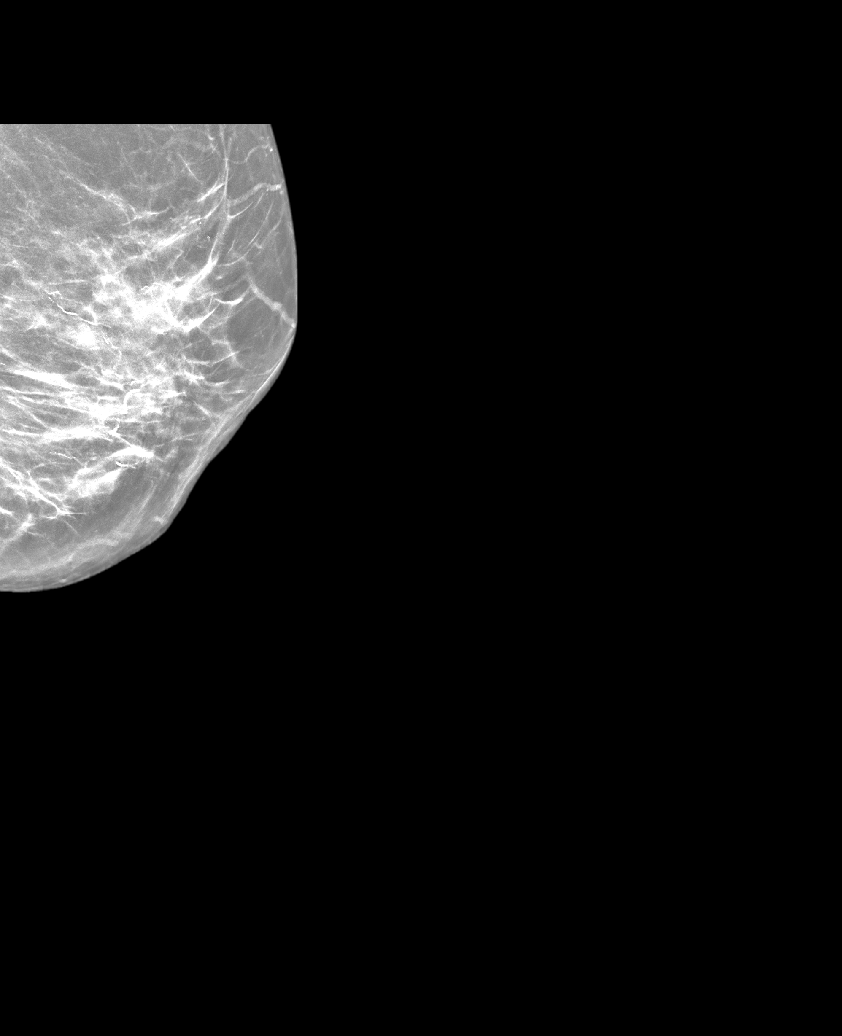

[R MLO synth-2D]
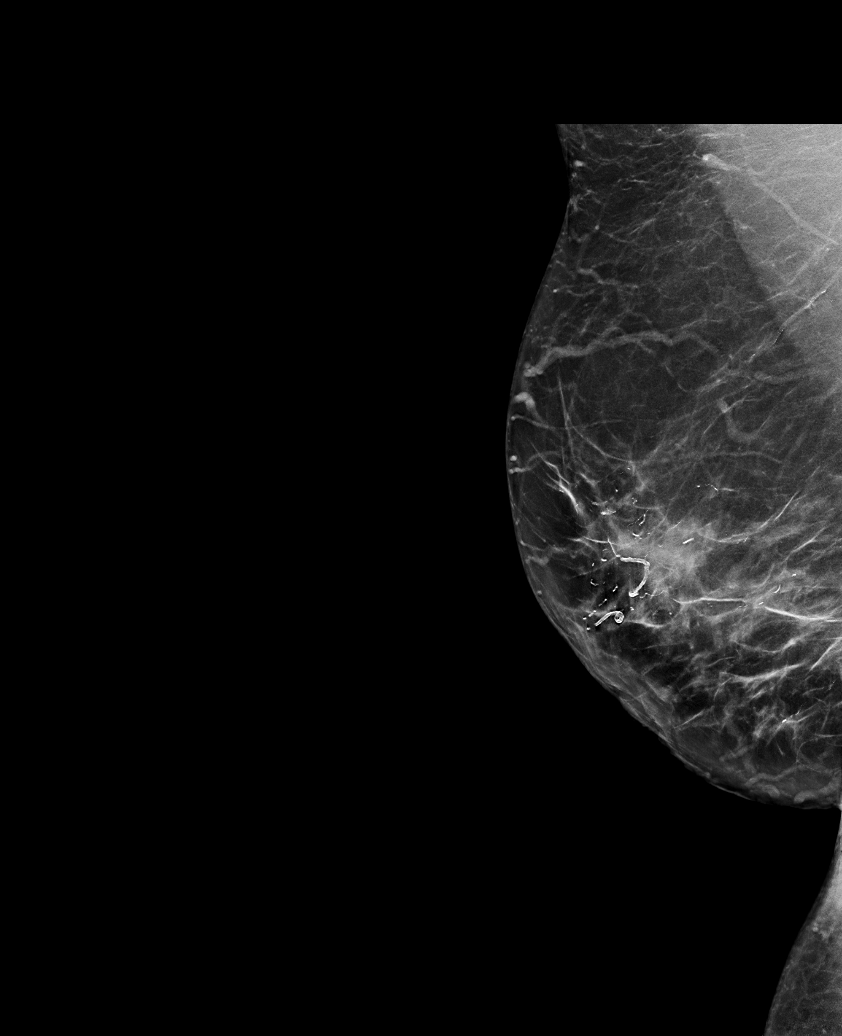

[L CC synth-2D]
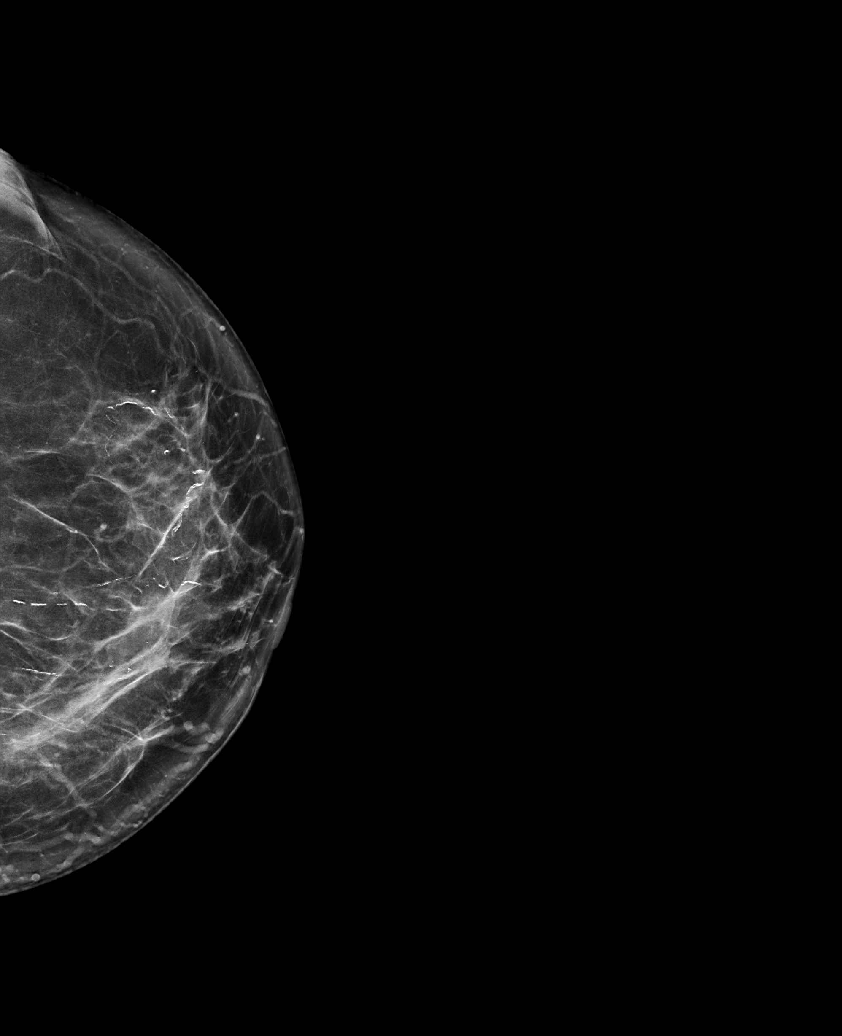

[R XCCL synth-2D]
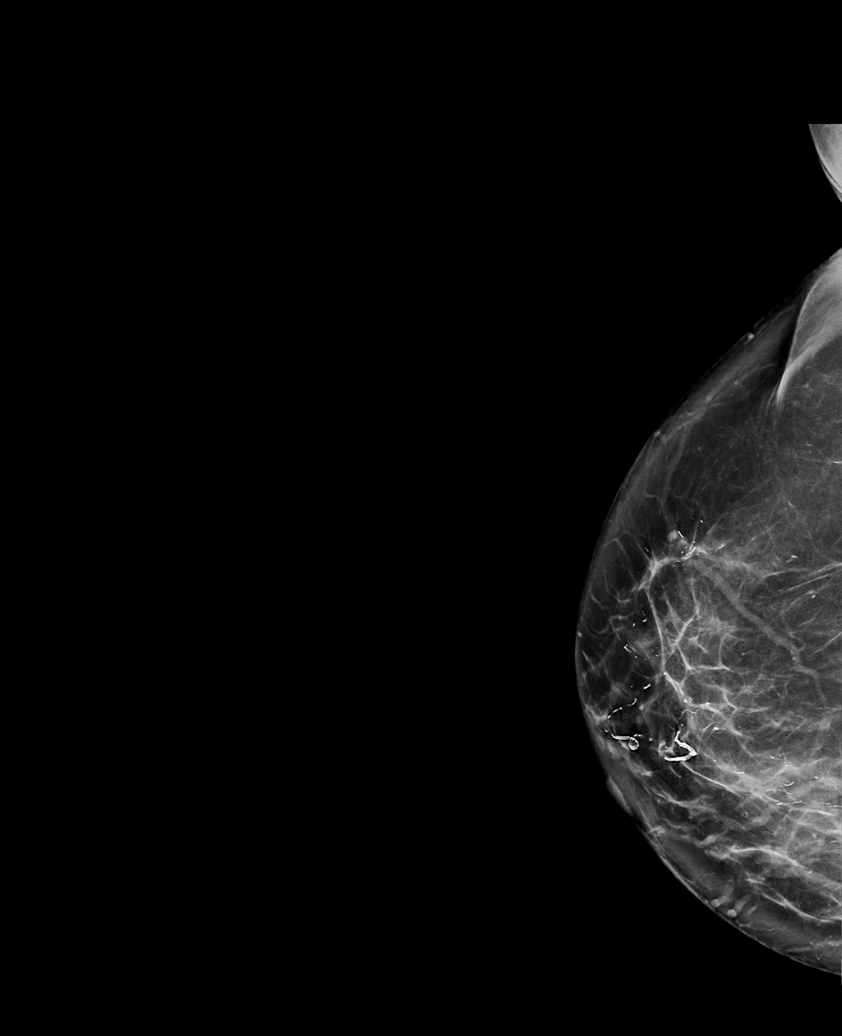

[R CC synth-2D]
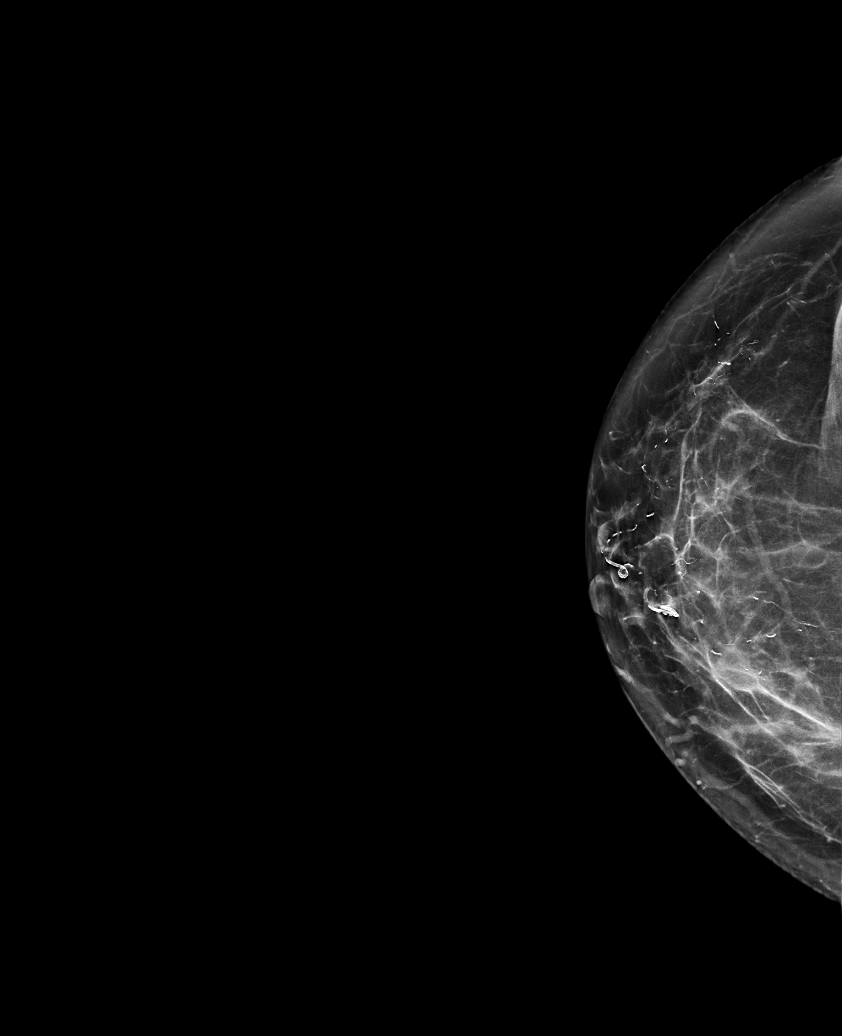

[L MLO synth-2D (2 of 2)]
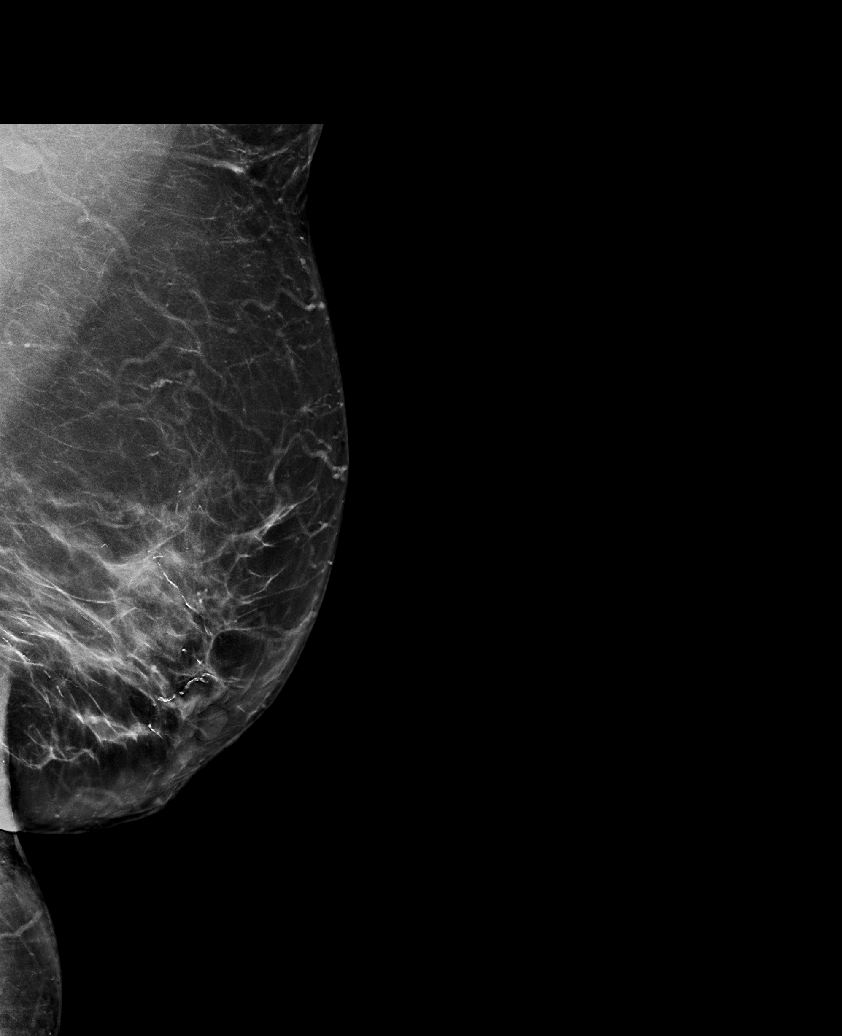

[6 of 36 positions shown; findings below may reference images not displayed]

ACR Breast Density Category c: The breast tissue is heterogeneously
dense, which may obscure small masses.
FINDINGS: There are no findings suspicious for malignancy. Images were
processed with CAD.
IMPRESSION: No mammographic evidence of malignancy. A result letter of this
screening mammogram will be mailed directly to the patient.

RECOMMENDATION:
Screening mammogram in one year. (Code:FT-U-LHB)

BI-RADS CATEGORY  1: Negative.

## 2020-07-05 ENCOUNTER — Ambulatory Visit: Payer: Medicare Other

## 2020-07-05 ENCOUNTER — Other Ambulatory Visit: Payer: Self-pay

## 2020-07-05 DIAGNOSIS — M79672 Pain in left foot: Secondary | ICD-10-CM | POA: Diagnosis not present

## 2020-07-05 DIAGNOSIS — R2689 Other abnormalities of gait and mobility: Secondary | ICD-10-CM

## 2020-07-05 DIAGNOSIS — M6281 Muscle weakness (generalized): Secondary | ICD-10-CM

## 2020-07-05 NOTE — Therapy (Signed)
[place], [place], [address] Phone: [phone]   Fax:  [phone]  Physical Therapy Treatment  Patient Details  Name: Maureen Barnes MRN: [id number] Date of Birth: [birth year] Referring Provider (PT): Lanae Crumbly, DPM   Encounter Date: 07/05/2020   PT End of Session - 07/05/20 1726    Visit Number 2    Number of Visits 10    Date for PT Re-Evaluation 08/06/20    Authorization Type UHC MCR/MCD    Progress Note Due on Visit 10    PT Start Time 5643    PT Stop Time 1725    PT Time Calculation (min) 44 min    Equipment Utilized During Treatment Other (comment)   pt had L ankle trilock brace issued by MD   Activity Tolerance Patient tolerated treatment well;No increased pain    Behavior During Therapy WFL for tasks assessed/performed           Past Medical History:  Diagnosis Date   Anemia    Cervical disc disease    Chronic kidney disease (CKD), stage V (HCC)    Diabetes mellitus    Hep C w/o coma, chronic ([place])    Hypertension    Lumbar disc disease    Peritoneal dialysis status ([place])    Renal disorder     Past Surgical History:  Procedure Laterality Date   TUBAL LIGATION      There were no vitals filed for this visit.   Subjective Assessment - 07/05/20 1644    Subjective patient denies pain currently, but will get occasional sharp pain when standing/walking or with pivoting motion. She has still wearing her brace and using a pain relieving cream that are helping with her pain. She was sore after the initial evaluation that lasted the rest of the day. She went to [employer] was instructed on proper footwear, but has not obtained what they recommended currently.    Pertinent History Had a fall about a year ago without injury and pain slipping whereby she landed on her knees. Per MD, L posterior tibial tendinitis, bil ples planus, gastroc equinus of L LE    Limitations  Standing;Walking;House hold activities    How long can you sit comfortably? no concerns    How long can you stand comfortably? unknown but a short period of time <15 min    How long can you walk comfortably? 1 hour    Diagnostic tests Xray demos no abnormalities or fractures    Currently in Pain? No/denies              [facility] PT Assessment - 07/05/20 0001      Berg Balance Test   Sit to Stand Able to stand without using hands and stabilize independently    Standing Unsupported Able to stand safely 2 minutes    Sitting with Back Unsupported but Feet Supported on Floor or Stool Able to sit safely and securely 2 minutes    Stand to Sit Sits safely with minimal use of hands    Transfers Able to transfer safely, minor use of hands    Standing Unsupported with Eyes Closed Able to stand 10 seconds safely    Standing Unsupported with Feet Together Able to place feet together independently and stand 1 minute safely    From Standing, Reach Forward with Outstretched Arm Can reach forward >12 cm safely (5")    From Standing Position, Pick up Object from Floor Able to pick  up shoe safely and easily    From Standing Position, Turn to Look Behind Over each Shoulder Looks behind from both sides and weight shifts well    Turn 360 Degrees Able to turn 360 degrees safely in 4 seconds or less    Standing Unsupported, Alternately Place Feet on Step/Stool Able to stand independently and safely and complete 8 steps in 20 seconds    Standing Unsupported, One Foot in [place] to take small step independently and hold 30 seconds    Standing on One Leg Tries to lift leg/unable to hold 3 seconds but remains standing independently    Total Score 50                         [facility] Adult PT Treatment/Exercise - 07/05/20 0001      Self-Care   Other Self-Care Comments  see patient education      Manual Therapy   Manual therapy comments STM to Lt posterior tib, plantar fascia, passive gastroc  stretch      Ankle Exercises: Stretches   Plantar Fascia Stretch Limitations fascial mobilization 30 sec    Soleus Stretch Limitations 3 x 30 sec supine Lt    Gastroc Stretch Limitations 3 x 30 sec supine Lt      Ankle Exercises: Seated   Other Seated Ankle Exercises arch lift 2 x 10                  PT Education - 07/05/20 1731    Education Details Education on HEP, Education on anatomy of current condition, Review of proper shoewear. Education on foot type.    Person(s) Educated Patient    Methods Explanation;Demonstration;Handout    Comprehension Verbalized understanding;Returned demonstration            PT Short Term Goals - 06/25/20 1319      PT SHORT TERM GOAL #1   Title Pt will be independent with initial HEP    Baseline did not have time at eval    Time 3    Period Weeks    Status New    Target Date 07/16/20      PT SHORT TERM GOAL #2   Title Pt will report improvement in L LE ankle pain x 25% to assist with improved functional mobility    Baseline up to 7/10    Time 3    Period Weeks    Status New    Target Date 07/16/20             PT Long Term Goals - 06/25/20 1320      PT LONG TERM GOAL #1   Title Pt will be able to walk up and down stairs with 75% less pain    Baseline up to 7-8/10 pain    Time 6    Period Weeks    Status New    Target Date 08/06/20      PT LONG TERM GOAL #2   Title pt will be able to walk x 30 min with </= 3/10 L ankle pain to go shopping    Baseline unable    Time 6    Period Weeks    Status New    Target Date 08/06/20      PT LONG TERM GOAL #3   Title pt will have improved Foto score to 41% limited    Baseline 62% limitation    Time 6    Period Weeks  Status New    Target Date 08/06/20      PT LONG TERM GOAL #4   Title Pt will be able to stand to perform ADLS with 99% less difficulty    Baseline unable without difficulty    Time 6    Period Weeks    Status New    Target Date 08/06/20                  Plan - 07/05/20 1727    Clinical Impression Statement Patient has significant tautness and palpable tenderness about Lt posterior tib and plantar fascia with partial release from manual therapy. Able to further assess balance and although patient scores in low fall risk category based upon BERG assessment, she has significant difficulty with single leg and tandem static balance. Overall good tolerance to ankle stretching and foot intrinsic strengthening without reports of pain.    Personal Factors and Comorbidities Comorbidity 1    Comorbidities Kidney transplant 06/09/20    Examination-Activity Limitations Squat;Stairs;Locomotion Level;Stand    Examination-Participation Restrictions Cleaning;Shop;Community Activity    Stability/Clinical Decision Making Evolving/Moderate complexity    Rehab Potential Good    PT Frequency 2x / week    PT Duration 6 weeks   sometimes pt states she can only come 1x/week   PT Treatment/Interventions Cryotherapy;Ultrasound;Moist Heat;Iontophoresis 4mg /ml Dexamethasone;Gait training;Stair training;Functional mobility training;Neuromuscular re-education;Balance training;Therapeutic exercise;Therapeutic activities;Patient/family education;Manual techniques;Passive range of motion;Dry needling;Taping;Joint Manipulations    PT Next Visit Plan Review HEP, static balance activity, intrinsic strengthening,STM to posterior tib, plantar fascia, gastroc/soleus.    PT Home Exercise Plan [id number]    Consulted and Agree with Plan of Care Patient           Patient will benefit from skilled therapeutic intervention in order to improve the following deficits and impairments:  Abnormal gait,Decreased endurance,Decreased mobility,Difficulty walking,Hypomobility,Decreased range of motion,Decreased activity tolerance,Decreased strength,Impaired flexibility,Pain  Visit Diagnosis: Pain in left foot  Muscle weakness (generalized)  Other abnormalities of gait and  mobility     Problem List Patient Active Problem List   Diagnosis Date Noted   Anxiety 07/17/2018   ESRD on peritoneal dialysis ([place]) 07/17/2018   H/O cesarean section 07/17/2018   Evaluation by medical service required 04/02/2018   Pre-transplant evaluation for end stage renal disease 11/18/2017   Unilateral inguinal hernia 11/18/2017   Hyperlipidemia 01/22/2017   Palpitations 01/22/2017   Dyspnea on exertion 01/22/2017   CKD (chronic kidney disease) stage 3, GFR 30-59 ml/min (HCC) 10/18/2016   Non-compliance 10/18/2016   Chronic ethmoidal sinusitis 10/18/2016   Hepatitis C, chronic ([place]) 05/15/2012   Diabetes mellitus type 2, insulin dependent ([place]) 05/15/2012   Hypertension 05/15/2012   Anemia 05/15/2012   Gwendolyn Grant, PT, DPT, ATC 07/05/20 5:34 PM [facility] [facility] [address] [place], [place], [address] Phone: [phone]   Fax:  [phone]  Name: Maureen Barnes MRN: [id number] Date of Birth: 06/24/1961

## 2020-07-07 ENCOUNTER — Other Ambulatory Visit: Payer: Self-pay

## 2020-07-07 ENCOUNTER — Ambulatory Visit: Payer: Medicare Other

## 2020-07-07 DIAGNOSIS — M79672 Pain in left foot: Secondary | ICD-10-CM

## 2020-07-07 DIAGNOSIS — M6281 Muscle weakness (generalized): Secondary | ICD-10-CM

## 2020-07-07 DIAGNOSIS — R2689 Other abnormalities of gait and mobility: Secondary | ICD-10-CM

## 2020-07-07 NOTE — Therapy (Signed)
[place], [place], [address] Phone: [phone]   Fax:  [phone]  Physical Therapy Treatment  Patient Details  Name: Maureen Barnes MRN: [id number] Date of Birth: February 06, 1961 Referring Provider (PT): Lanae Crumbly, DPM   Encounter Date: 07/07/2020   PT End of Session - 07/07/20 1638    Visit Number 3    Number of Visits 10    Date for PT Re-Evaluation 08/06/20    Authorization Type UHC MCR/MCD    Progress Note Due on Visit 10    PT Start Time 1525    PT Stop Time 1615    PT Time Calculation (min) 50 min    Equipment Utilized During Treatment Other (comment)   pt had L ankle trilock brace issued by MD   Activity Tolerance Patient tolerated treatment well;No increased pain    Behavior During Therapy WFL for tasks assessed/performed           Past Medical History:  Diagnosis Date  . Anemia   . Cervical disc disease   . Chronic kidney disease (CKD), stage V ([place])   . Diabetes mellitus   . Hep C w/o coma, chronic ([place])   . Hypertension   . Lumbar disc disease   . Peritoneal dialysis status ([place])   . Renal disorder     Past Surgical History:  Procedure Laterality Date  . TUBAL LIGATION      There were no vitals filed for this visit.   Subjective Assessment - 07/07/20 1526    Subjective She reports the foot is feeling about the same and is still looking for a better pair of tennis shoes. She reports soreness along posterior tib after last session that lasted about 30 minutes. She is wearing her brace, but did not wear it to PT because she knew we would be taking it off during the session.    Pertinent History Had a fall about a year ago without injury and pain slipping whereby she landed on her knees. Per MD, L posterior tibial tendinitis, bil ples planus, gastroc equinus of L LE    Limitations Standing;Walking;House hold activities    How long can you sit comfortably? no concerns    How  long can you stand comfortably? unknown but a short period of time <15 min    How long can you walk comfortably? 1 hour    Diagnostic tests Xray demos no abnormalities or fractures    Currently in Pain? Yes    Pain Score 4     Pain Location Ankle    Pain Orientation Left;Medial    Pain Descriptors / Indicators Aching    Pain Type Chronic pain    Pain Onset More than a month ago    Pain Frequency Intermittent    Multiple Pain Sites No                             [facility] Adult PT Treatment/Exercise - 07/07/20 0001      Manual Therapy   Manual therapy comments STM/TrP release to Lt posterior tib, anterior tib, gastroc/soleus, plantar fascia. Passive gastroc stretching      Ankle Exercises: Seated   Marble Pickup 2 sets of 15 marbles      Ankle Exercises: Stretches   Soleus Stretch Limitations 2 x 30 sec standing    Gastroc Stretch Limitations 2 x 30 sec standing      Ankle Exercises:  Standing   Other Standing Ankle Exercises semi-tandem 3 x 30 sec each    Other Standing Ankle Exercises rhomberg on foam 3 x 30 sec each                  PT Education - 07/07/20 1637    Education Details Education on updated HEP. Education on dry needling indications and side effects.    Person(s) Educated Patient    Methods Explanation;Demonstration;Handout    Comprehension Verbalized understanding;Returned demonstration            PT Short Term Goals - 06/25/20 1319      PT SHORT TERM GOAL #1   Title Pt will be independent with initial HEP    Baseline did not have time at eval    Time 3    Period Weeks    Status New    Target Date 07/16/20      PT SHORT TERM GOAL #2   Title Pt will report improvement in L LE ankle pain x 25% to assist with improved functional mobility    Baseline up to 7/10    Time 3    Period Weeks    Status New    Target Date 07/16/20             PT Long Term Goals - 06/25/20 1320      PT LONG TERM GOAL #1   Title Pt will be  able to walk up and down stairs with 75% less pain    Baseline up to 7-8/10 pain    Time 6    Period Weeks    Status New    Target Date 08/06/20      PT LONG TERM GOAL #2   Title pt will be able to walk x 30 min with </= 3/10 L ankle pain to go shopping    Baseline unable    Time 6    Period Weeks    Status New    Target Date 08/06/20      PT LONG TERM GOAL #3   Title pt will have improved Foto score to 41% limited    Baseline 62% limitation    Time 6    Period Weeks    Status New    Target Date 08/06/20      PT LONG TERM GOAL #4   Title Pt will be able to stand to perform ADLS with 00% less difficulty    Baseline unable without difficulty    Time 6    Period Weeks    Status New    Target Date 08/06/20                 Plan - 07/07/20 1557    Clinical Impression Statement Significant tautness and moderate trigger points about Lt posterior tib with partial release from manual therapy. Patient will potentially benefit from dry needling at future sessions if trigger points remain. Patient challenged with static balance activity with moderate postural sway and occasional LOB.    Personal Factors and Comorbidities Comorbidity 1    Comorbidities Kidney transplant 06/09/20    Examination-Activity Limitations Squat;Stairs;Locomotion Level;Stand    Examination-Participation Restrictions Cleaning;Shop;Community Activity    Stability/Clinical Decision Making Evolving/Moderate complexity    Rehab Potential Good    PT Frequency 2x / week    PT Duration 6 weeks   sometimes pt states she can only come 1x/week   PT Treatment/Interventions Cryotherapy;Ultrasound;Moist Heat;Iontophoresis 4mg /ml Dexamethasone;Gait training;Stair training;Functional mobility training;Neuromuscular re-education;Balance training;Therapeutic  exercise;Therapeutic activities;Patient/family education;Manual techniques;Passive range of motion;Dry needling;Taping;Joint Manipulations    PT Next Visit Plan STM to  posterior tib, plantar fascia, gastroc/soleus, foot intrinsic strengthening, consider dry needling to posterior tib, progress static balance    PT Home Exercise Plan [id number]    Consulted and Agree with Plan of Care Patient           Patient will benefit from skilled therapeutic intervention in order to improve the following deficits and impairments:  Abnormal gait,Decreased endurance,Decreased mobility,Difficulty walking,Hypomobility,Decreased range of motion,Decreased activity tolerance,Decreased strength,Impaired flexibility,Pain  Visit Diagnosis: Pain in left foot  Muscle weakness (generalized)  Other abnormalities of gait and mobility     Problem List Patient Active Problem List   Diagnosis Date Noted  . Anxiety 07/17/2018  . ESRD on peritoneal dialysis ([place]) 07/17/2018  . H/O cesarean section 07/17/2018  . Evaluation by medical service required 04/02/2018  . Pre-transplant evaluation for end stage renal disease 11/18/2017  . Unilateral inguinal hernia 11/18/2017  . Hyperlipidemia 01/22/2017  . Palpitations 01/22/2017  . Dyspnea on exertion 01/22/2017  . CKD (chronic kidney disease) stage 3, GFR 30-59 ml/min (HCC) 10/18/2016  . Non-compliance 10/18/2016  . Chronic ethmoidal sinusitis 10/18/2016  . Hepatitis C, chronic ([place]) 05/15/2012  . Diabetes mellitus type 2, insulin dependent ([place]) 05/15/2012  . Hypertension 05/15/2012  . Anemia 05/15/2012   Gwendolyn Grant, PT, DPT, ATC 07/07/20 4:41 PM [place] [facility] [address] [place], [place], [address] Phone: [phone]   Fax:  [phone]  Name: ADELEE HANNULA MRN: [id number] Date of Birth: 08/12/1960

## 2020-07-08 ENCOUNTER — Ambulatory Visit: Payer: Medicare Other | Admitting: Podiatry

## 2020-07-12 ENCOUNTER — Ambulatory Visit: Payer: Medicare Other

## 2020-07-12 ENCOUNTER — Other Ambulatory Visit: Payer: Self-pay

## 2020-07-12 DIAGNOSIS — M6281 Muscle weakness (generalized): Secondary | ICD-10-CM

## 2020-07-12 DIAGNOSIS — M79672 Pain in left foot: Secondary | ICD-10-CM | POA: Diagnosis not present

## 2020-07-12 DIAGNOSIS — R2689 Other abnormalities of gait and mobility: Secondary | ICD-10-CM

## 2020-07-12 NOTE — Therapy (Signed)
[place], [place], [address] Phone: [phone]   Fax:  [phone]  Physical Therapy Treatment  Patient Details  Name: Maureen Barnes MRN: [id number] Date of Birth: 11-27-1960 Referring Provider (PT): Lanae Crumbly, DPM   Encounter Date: 07/12/2020   PT End of Session - 07/12/20 1133    Visit Number 4    Number of Visits 10    Date for PT Re-Evaluation 08/06/20    Authorization Type UHC MCR/MCD    Progress Note Due on Visit 10    PT Start Time 1100    PT Stop Time 1145    PT Time Calculation (min) 45 min    Equipment Utilized During Treatment Other (comment)   pt had L ankle trilock brace issued by MD   Activity Tolerance Patient tolerated treatment well;No increased pain    Behavior During Therapy WFL for tasks assessed/performed           Past Medical History:  Diagnosis Date  . Anemia   . Cervical disc disease   . Chronic kidney disease (CKD), stage V ([place])   . Diabetes mellitus   . Hep C w/o coma, chronic ([place])   . Hypertension   . Lumbar disc disease   . Peritoneal dialysis status ([place])   . Renal disorder     Past Surgical History:  Procedure Laterality Date  . TUBAL LIGATION      There were no vitals filed for this visit.   Subjective Assessment - 07/12/20 1103    Subjective She reports the back of the ankle is bothering her when walking and the Rt knee started hurting after completing her standing stretches. She reports throbbing along the ankle after last session when walking. She continues to wear her brace with all activity. She reports that the bottom of her foot is feeling better since starting PT, but the ankle pain is about the same. She has f/u with physician later this week.    Pertinent History Had a fall about a year ago without injury and pain slipping whereby she landed on her knees. Per MD, L posterior tibial tendinitis, bil ples planus, gastroc equinus of L LE     Limitations Standing;Walking;House hold activities    How long can you sit comfortably? no concerns    How long can you stand comfortably? unknown but a short period of time <15 min    How long can you walk comfortably? 1 hour    Diagnostic tests Xray demos no abnormalities or fractures    Patient Stated Goals to move around painfree    Currently in Pain? Yes    Pain Score 3     Pain Location Ankle    Pain Orientation Left;Medial    Pain Descriptors / Indicators Aching    Pain Type Chronic pain    Pain Onset More than a month ago    Pain Frequency Intermittent              [facility] PT Assessment - 07/12/20 0001      AROM   Overall AROM Comments L ankle DF: 6 R ankle DF: 10                         [facility] Adult PT Treatment/Exercise - 07/12/20 0001      Self-Care   Other Self-Care Comments  see patient education      Manual Therapy   Manual therapy comments  STM/TrP release Lt posterior tib, anterior tib, gastroc/soleus. Passive gastroc stretch      Ankle Exercises: Seated   Marble Pickup 2 sets of 15 marbles    Other Seated Ankle Exercises calf raise on leg press with IR and ER 1x  10 each 20 lbs      Ankle Exercises: Stretches   Soleus Stretch Limitations 1 x 30 sec standing bilateral    Gastroc Stretch Limitations 1 x 30 sec standing bilateral      Ankle Exercises: Standing   Other Standing Ankle Exercises semi-tandem 3 x 30 sec each EC                  PT Education - 07/12/20 1132    Education Details Review of HEP stretches with recommendation to decrease duration of hold time. Education on dry needling indications, expectations, and side effects.    Person(s) Educated Patient    Methods Explanation;Demonstration;Handout    Comprehension Verbalized understanding;Returned demonstration            PT Short Term Goals - 06/25/20 1319      PT SHORT TERM GOAL #1   Title Pt will be independent with initial HEP    Baseline did not have  time at eval    Time 3    Period Weeks    Status New    Target Date 07/16/20      PT SHORT TERM GOAL #2   Title Pt will report improvement in L LE ankle pain x 25% to assist with improved functional mobility    Baseline up to 7/10    Time 3    Period Weeks    Status New    Target Date 07/16/20             PT Long Term Goals - 06/25/20 1320      PT LONG TERM GOAL #1   Title Pt will be able to walk up and down stairs with 75% less pain    Baseline up to 7-8/10 pain    Time 6    Period Weeks    Status New    Target Date 08/06/20      PT LONG TERM GOAL #2   Title pt will be able to walk x 30 min with </= 3/10 L ankle pain to go shopping    Baseline unable    Time 6    Period Weeks    Status New    Target Date 08/06/20      PT LONG TERM GOAL #3   Title pt will have improved Foto score to 41% limited    Baseline 62% limitation    Time 6    Period Weeks    Status New    Target Date 08/06/20      PT LONG TERM GOAL #4   Title Pt will be able to stand to perform ADLS with 50% less difficulty    Baseline unable without difficulty    Time 6    Period Weeks    Status New    Target Date 08/06/20                 Plan - 07/12/20 1133    Clinical Impression Statement Tautness and palpable tenderness remain in Lt posterior tib and gastroc/soleus complex. Time spent educating patient on dry needling indications and patient is potentially interested in trying dry needling at next session. Her Lt ankle dorsiflexion AROM has improved since initial evaluation, though  she still lacks functional ROM. Light progression of balance and strengthening activity without increased ankle pain reported.    Personal Factors and Comorbidities Comorbidity 1    Comorbidities Kidney transplant 06/09/20    Examination-Activity Limitations Squat;Stairs;Locomotion Level;Stand    Examination-Participation Restrictions Cleaning;Shop;Community Activity    Stability/Clinical Decision Making  Evolving/Moderate complexity    Rehab Potential Good    PT Frequency 2x / week    PT Duration 6 weeks   sometimes pt states she can only come 1x/week   PT Treatment/Interventions Cryotherapy;Ultrasound;Moist Heat;Iontophoresis 4mg /ml Dexamethasone;Gait training;Stair training;Functional mobility training;Neuromuscular re-education;Balance training;Therapeutic exercise;Therapeutic activities;Patient/family education;Manual techniques;Passive range of motion;Dry needling;Taping;Joint Manipulations    PT Next Visit Plan Consider DN of posterior tib, gastroc/soleus. progress static balance activity. Consider 4 way resisted ankle.    PT Home Exercise Plan [id number]    Consulted and Agree with Plan of Care Patient           Patient will benefit from skilled therapeutic intervention in order to improve the following deficits and impairments:  Abnormal gait,Decreased endurance,Decreased mobility,Difficulty walking,Hypomobility,Decreased range of motion,Decreased activity tolerance,Decreased strength,Impaired flexibility,Pain  Visit Diagnosis: Pain in left foot  Muscle weakness (generalized)  Other abnormalities of gait and mobility     Problem List Patient Active Problem List   Diagnosis Date Noted  . Anxiety 07/17/2018  . ESRD on peritoneal dialysis ([place]) 07/17/2018  . H/O cesarean section 07/17/2018  . Evaluation by medical service required 04/02/2018  . Pre-transplant evaluation for end stage renal disease 11/18/2017  . Unilateral inguinal hernia 11/18/2017  . Hyperlipidemia 01/22/2017  . Palpitations 01/22/2017  . Dyspnea on exertion 01/22/2017  . CKD (chronic kidney disease) stage 3, GFR 30-59 ml/min (HCC) 10/18/2016  . Non-compliance 10/18/2016  . Chronic ethmoidal sinusitis 10/18/2016  . Hepatitis C, chronic ([place]) 05/15/2012  . Diabetes mellitus type 2, insulin dependent ([place]) 05/15/2012  . Hypertension 05/15/2012  . Anemia 05/15/2012   Gwendolyn Grant, PT, DPT, ATC 07/12/20  11:55 AM  [facility] [address] [place], [place], [address] Phone: [phone]   Fax:  [phone]  Name: Maureen Barnes MRN: [id number] Date of Birth: [birth year]

## 2020-07-14 ENCOUNTER — Ambulatory Visit: Payer: Medicare Other

## 2020-07-14 ENCOUNTER — Ambulatory Visit (INDEPENDENT_AMBULATORY_CARE_PROVIDER_SITE_OTHER): Payer: Medicare Other | Admitting: Podiatry

## 2020-07-14 ENCOUNTER — Other Ambulatory Visit: Payer: Self-pay

## 2020-07-14 DIAGNOSIS — M79672 Pain in left foot: Secondary | ICD-10-CM

## 2020-07-14 DIAGNOSIS — M21862 Other specified acquired deformities of left lower leg: Secondary | ICD-10-CM

## 2020-07-14 DIAGNOSIS — R2689 Other abnormalities of gait and mobility: Secondary | ICD-10-CM

## 2020-07-14 DIAGNOSIS — M2141 Flat foot [pes planus] (acquired), right foot: Secondary | ICD-10-CM

## 2020-07-14 DIAGNOSIS — M2142 Flat foot [pes planus] (acquired), left foot: Secondary | ICD-10-CM

## 2020-07-14 DIAGNOSIS — M76822 Posterior tibial tendinitis, left leg: Secondary | ICD-10-CM

## 2020-07-14 DIAGNOSIS — M6281 Muscle weakness (generalized): Secondary | ICD-10-CM

## 2020-07-14 DIAGNOSIS — M216X2 Other acquired deformities of left foot: Secondary | ICD-10-CM

## 2020-07-14 NOTE — Therapy (Signed)
[place], [place], [address] Phone: [phone]   Fax:  [phone]  Physical Therapy Treatment  Patient Details  Name: Maureen Barnes MRN: [id number] Date of Birth: 17-Nov-1960 Referring Provider (PT): Lanae Crumbly, DPM   Encounter Date: 07/14/2020   PT End of Session - 07/14/20 1058    Visit Number 5    Number of Visits 10    Date for PT Re-Evaluation 08/06/20    Authorization Type UHC MCR/MCD    Progress Note Due on Visit 10    PT Start Time 1055    PT Stop Time 1150    PT Time Calculation (min) 55 min    Equipment Utilized During Treatment --    Activity Tolerance Patient tolerated treatment well;Patient limited by pain    Behavior During Therapy [facility] for tasks assessed/performed           Past Medical History:  Diagnosis Date  . Anemia   . Cervical disc disease   . Chronic kidney disease (CKD), stage V ([place])   . Diabetes mellitus   . Hep C w/o coma, chronic ([place])   . Hypertension   . Lumbar disc disease   . Peritoneal dialysis status ([place])   . Renal disorder     Past Surgical History:  Procedure Laterality Date  . TUBAL LIGATION      There were no vitals filed for this visit.   Subjective Assessment - 07/14/20 1055    Subjective She feels her ankle is "good" but her rest and use of modalities at home is only temporary relief of pain. She states she felt better after last session, but was feeling some pain on the outside of the ankle. She thinks it's too soon to tell if her prescribed HEP is helping. She has follow-up with physician later today.    Pertinent History Had a fall about a year ago without injury and pain slipping whereby she landed on her knees. Per MD, L posterior tibial tendinitis, bil ples planus, gastroc equinus of L LE    Limitations Standing;Walking;House hold activities    Currently in Pain? Yes    Pain Score 3     Pain Location Ankle    Pain Orientation  Left;Anterior;Medial    Pain Descriptors / Indicators Aching    Pain Type Chronic pain    Pain Onset More than a month ago    Pain Frequency Intermittent    Multiple Pain Sites No                             [facility] Adult PT Treatment/Exercise - 07/14/20 0001      Moist Heat Therapy   Number Minutes Moist Heat 10 Minutes    Moist Heat Location Ankle      Manual Therapy   Manual therapy comments STM/TrP release to Lt posterior tib, anterior tib, gastroc/soleus. passive gastroc stretch, skilled palpation performed for trigger point dry needling      Ankle Exercises: Standing   Other Standing Ankle Exercises standing march attempted pain    Other Standing Ankle Exercises rhomberg on foam eyes closed 3 x 30 sec      Ankle Exercises: Seated   Marble Pickup 2 sets of 15 marbles    Other Seated Ankle Exercises 4 way ankle yellow TB 2 x 10 LLE            Trigger Point Dry Needling -  07/14/20 0001    Consent Given? Yes    Education Handout Provided Previously provided    Muscles Treated Lower Quadrant Posterior tibialis    Dry Needling Comments Performed by Starr [place], PT, DPT, ATC    Posterior tibialis Response Twitch response elicited;Palpable increased muscle length                PT Education - 07/14/20 1147    Education Details Review of dry needling indications, side effects. Patient verablized consent for dry needling.    Person(s) Educated Patient    Methods Explanation;Demonstration    Comprehension Verbalized understanding            PT Short Term Goals - 06/25/20 1319      PT SHORT TERM GOAL #1   Title Pt will be independent with initial HEP    Baseline did not have time at eval    Time 3    Period Weeks    Status New    Target Date 07/16/20      PT SHORT TERM GOAL #2   Title Pt will report improvement in L LE ankle pain x 25% to assist with improved functional mobility    Baseline up to 7/10    Time 3    Period Weeks     Status New    Target Date 07/16/20             PT Long Term Goals - 06/25/20 1320      PT LONG TERM GOAL #1   Title Pt will be able to walk up and down stairs with 75% less pain    Baseline up to 7-8/10 pain    Time 6    Period Weeks    Status New    Target Date 08/06/20      PT LONG TERM GOAL #2   Title pt will be able to walk x 30 min with </= 3/10 L ankle pain to go shopping    Baseline unable    Time 6    Period Weeks    Status New    Target Date 08/06/20      PT LONG TERM GOAL #3   Title pt will have improved Foto score to 41% limited    Baseline 62% limitation    Time 6    Period Weeks    Status New    Target Date 08/06/20      PT LONG TERM GOAL #4   Title Pt will be able to stand to perform ADLS with 68% less difficulty    Baseline unable without difficulty    Time 6    Period Weeks    Status New    Target Date 08/06/20                 Plan - 07/14/20 1051    Clinical Impression Statement Patient demonstrates sound twitch response with dry needling of the Lt posterior tibialis, reporting post-intervention soreness as expected that was relieved with stretching and MHP. Notable reduction in posterior tibialis tautness and palpable tenderness following dry needling, though multiple trigger points still remain. She will potentially benefit from continued dry needling at future sessions. Overall good tolerance to balance and strengthening exercises with exception of standing march as patient reports increased pain about Lt anteromedial ankle during SLS on LLE.    Personal Factors and Comorbidities Comorbidity 1    Comorbidities Kidney transplant 06/09/20    Examination-Activity Limitations Squat;Stairs;Locomotion Level;Stand  Examination-Participation Restrictions Cleaning;Shop;Community Activity    Stability/Clinical Decision Making Evolving/Moderate complexity    Rehab Potential Good    PT Frequency 2x / week    PT Duration 6 weeks   sometimes pt  states she can only come 1x/week   PT Treatment/Interventions Cryotherapy;Ultrasound;Moist Heat;Iontophoresis 4mg /ml Dexamethasone;Gait training;Stair training;Functional mobility training;Neuromuscular re-education;Balance training;Therapeutic exercise;Therapeutic activities;Patient/family education;Manual techniques;Passive range of motion;Dry needling;Taping;Joint Manipulations    PT Next Visit Plan Assess response to DN. Consider DN of posterior tib, gastroc/soleus. update HEP. Progress balance and ankle strength as tolerated. Consider hip abductor strengthening.    PT Home Exercise Plan [id number]    Consulted and Agree with Plan of Care Patient           Patient will benefit from skilled therapeutic intervention in order to improve the following deficits and impairments:  Abnormal gait,Decreased endurance,Decreased mobility,Difficulty walking,Hypomobility,Decreased range of motion,Decreased activity tolerance,Decreased strength,Impaired flexibility,Pain  Visit Diagnosis: Pain in left foot  Muscle weakness (generalized)  Other abnormalities of gait and mobility     Problem List Patient Active Problem List   Diagnosis Date Noted  . Anxiety 07/17/2018  . ESRD on peritoneal dialysis ([place]) 07/17/2018  . H/O cesarean section 07/17/2018  . Evaluation by medical service required 04/02/2018  . Pre-transplant evaluation for end stage renal disease 11/18/2017  . Unilateral inguinal hernia 11/18/2017  . Hyperlipidemia 01/22/2017  . Palpitations 01/22/2017  . Dyspnea on exertion 01/22/2017  . CKD (chronic kidney disease) stage 3, GFR 30-59 ml/min (HCC) 10/18/2016  . Non-compliance 10/18/2016  . Chronic ethmoidal sinusitis 10/18/2016  . Hepatitis C, chronic ([place]) 05/15/2012  . Diabetes mellitus type 2, insulin dependent ([place]) 05/15/2012  . Hypertension 05/15/2012  . Anemia 05/15/2012   Gwendolyn Grant, PT, DPT, ATC 07/14/20 12:04 PM  [facility]  [facility] [address] [place], [place], [address] Phone: [phone]   Fax:  [phone]  Name: COURTNAY PETRILLA MRN: [id number] Date of Birth: [birth year]

## 2020-07-18 NOTE — Progress Notes (Signed)
  Subjective:  Patient ID: Maureen Barnes, female    DOB: 1961/07/15,  MRN: [id number]  Chief Complaint  Patient presents with  . Foot Pain    PT stated that she is not doing any better and there has been no changes. She did state that the brace does help a little bit.    59 y.o. female returns with the above complaint. History confirmed with patient.  She gets her physical therapy but is only been for consult visit and has not had any real treatment sessions.  Thinks it may help.  Objective:  Physical Exam: warm, good capillary refill, no trophic changes or ulcerative lesions, normal DP and PT pulses and normal sensory exam. Left Foot: pain along PT tendon in retromalleolar area and towards navicular, not tender at insertion. Unable to do single heel rise, able to do double   Radiographs: X-ray of the left foot: no fracture, dislocation, swelling or degenerative changes noted , pes planus noted Assessment:   No diagnosis found.   Plan:  Patient was evaluated and treated and all questions answered.       -Recommended topical Voltaren treatment -Continue use of Tri-Lock ankle brace.  Patient educated on use  -Continue stretching and icing at home -I think physical therapy will be beneficial, she is only had 1 or 2 sessions and likely just needs more time working with this. -Continue PT for now, if she is not better by the next visit will consider MRI in CAM boot -We also discussed different types of supportive shoes would be best for her given this condition   Return in about 6 weeks (around 08/25/2020).

## 2020-07-19 ENCOUNTER — Other Ambulatory Visit: Payer: Self-pay

## 2020-07-19 ENCOUNTER — Ambulatory Visit: Payer: Medicare Other

## 2020-07-19 DIAGNOSIS — M79672 Pain in left foot: Secondary | ICD-10-CM

## 2020-07-19 DIAGNOSIS — M6281 Muscle weakness (generalized): Secondary | ICD-10-CM

## 2020-07-19 DIAGNOSIS — R2689 Other abnormalities of gait and mobility: Secondary | ICD-10-CM

## 2020-07-19 NOTE — Therapy (Signed)
[place], [place], [address] Phone: [phone]   Fax:  [phone]  Physical Therapy Treatment  Patient Details  Name: Maureen Barnes MRN: [id number] Date of Birth: 12/07/1960 Referring Provider (PT): Lanae Crumbly, DPM   Encounter Date: 07/19/2020   PT End of Session - 07/19/20 1140    Visit Number 6    Number of Visits 10    Date for PT Re-Evaluation 08/06/20    Authorization Type UHC MCR/MCD    Progress Note Due on Visit 10    PT Start Time 1108    PT Stop Time 1146    PT Time Calculation (min) 38 min    Activity Tolerance Patient tolerated treatment well    Behavior During Therapy [facility] for tasks assessed/performed           Past Medical History:  Diagnosis Date  . Anemia   . Cervical disc disease   . Chronic kidney disease (CKD), stage V ([place])   . Diabetes mellitus   . Hep C w/o coma, chronic ([place])   . Hypertension   . Lumbar disc disease   . Peritoneal dialysis status ([place])   . Renal disorder     Past Surgical History:  Procedure Laterality Date  . TUBAL LIGATION      There were no vitals filed for this visit.   Subjective Assessment - 07/19/20 1109    Subjective When she is stepping she is getting sharp pain along the back of the ankle but is not experiencing pain in the bottom of the foot when standing. She saw physician last week and was recommended to continue PT and obtain proper shoewear. She has a f/u in 6 weeks. She reports she did ok with needling after last session.    Pertinent History Had a fall about a year ago without injury and pain slipping whereby she landed on her knees. Per MD, L posterior tibial tendinitis, bil ples planus, gastroc equinus of L LE    Limitations Standing;Walking;House hold activities    Currently in Pain? Yes    Pain Score 3     Pain Location Ankle    Pain Orientation Posterior;Left;Lateral;Medial    Pain Descriptors / Indicators Sharp     Pain Type Chronic pain    Pain Onset More than a month ago    Pain Frequency Intermittent              [facility] PT Assessment - 07/19/20 0001      Observation/Other Assessments   Focus on Therapeutic Outcomes (FOTO)  55%function; 59% predicted                         [facility] Adult PT Treatment/Exercise - 07/19/20 0001      Self-Care   Other Self-Care Comments  see patient education      Manual Therapy   Manual therapy comments STM/TrP release Lt anterior and posterior tib, gastroc/soleus, passive gastroc stretch      Ankle Exercises: Standing   Other Standing Ankle Exercises tandem 3 x 30 sec each      Ankle Exercises: Stretches   Slant Board Stretch Other (comment)   90 sec each gastroc and soleus                 PT Education - 07/19/20 1233    Education Details education on FOTO results and overall functional improvement thus far. review of current HEP.  Person(s) Educated Patient    Methods Explanation    Comprehension Verbalized understanding            PT Short Term Goals - 06/25/20 1319      PT SHORT TERM GOAL #1   Title Pt will be independent with initial HEP    Baseline did not have time at eval    Time 3    Period Weeks    Status New    Target Date 07/16/20      PT SHORT TERM GOAL #2   Title Pt will report improvement in L LE ankle pain x 25% to assist with improved functional mobility    Baseline up to 7/10    Time 3    Period Weeks    Status New    Target Date 07/16/20             PT Long Term Goals - 06/25/20 1320      PT LONG TERM GOAL #1   Title Pt will be able to walk up and down stairs with 75% less pain    Baseline up to 7-8/10 pain    Time 6    Period Weeks    Status New    Target Date 08/06/20      PT LONG TERM GOAL #2   Title pt will be able to walk x 30 min with </= 3/10 L ankle pain to go shopping    Baseline unable    Time 6    Period Weeks    Status New    Target Date 08/06/20      PT LONG  TERM GOAL #3   Title pt will have improved Foto score to 41% limited    Baseline 62% limitation    Time 6    Period Weeks    Status New    Target Date 08/06/20      PT LONG TERM GOAL #4   Title Pt will be able to stand to perform ADLS with 18% less difficulty    Baseline unable without difficulty    Time 6    Period Weeks    Status New    Target Date 08/06/20                 Plan - 07/19/20 1233    Clinical Impression Statement Patient's static balance is gradually improving with patient able to perform tandem balance with occasional need to implement reaching strategy to maintain balance with LLE in rear. Patient has improved tolerance to STM of posterior tibialis, though significant palpable tenderness about gastroc with notable trigger points throughout musculature. She will potentially benefit from dry needling at future sessions of trigger points remain.    Personal Factors and Comorbidities Comorbidity 1    Comorbidities Kidney transplant 06/09/20    Examination-Activity Limitations Squat;Stairs;Locomotion Level;Stand    Examination-Participation Restrictions Cleaning;Shop;Community Activity    Stability/Clinical Decision Making Evolving/Moderate complexity    Rehab Potential Good    PT Frequency 2x / week    PT Duration 6 weeks   sometimes pt states she can only come 1x/week   PT Treatment/Interventions Cryotherapy;Ultrasound;Moist Heat;Iontophoresis 4mg /ml Dexamethasone;Gait training;Stair training;Functional mobility training;Neuromuscular re-education;Balance training;Therapeutic exercise;Therapeutic activities;Patient/family education;Manual techniques;Passive range of motion;Dry needling;Taping;Joint Manipulations    PT Next Visit Plan Consider DN of posterior tib, gastroc/soleus. update HEP. Progress static balance activity. Consider hip strengthening.    PT Home Exercise Plan [id number]    Consulted and Agree with Plan of Care Patient  Patient will  benefit from skilled therapeutic intervention in order to improve the following deficits and impairments:  Abnormal gait,Decreased endurance,Decreased mobility,Difficulty walking,Hypomobility,Decreased range of motion,Decreased activity tolerance,Decreased strength,Impaired flexibility,Pain  Visit Diagnosis: Pain in left foot  Muscle weakness (generalized)  Other abnormalities of gait and mobility     Problem List Patient Active Problem List   Diagnosis Date Noted  . Anxiety 07/17/2018  . ESRD on peritoneal dialysis ([place]) 07/17/2018  . H/O cesarean section 07/17/2018  . Evaluation by medical service required 04/02/2018  . Pre-transplant evaluation for end stage renal disease 11/18/2017  . Unilateral inguinal hernia 11/18/2017  . Hyperlipidemia 01/22/2017  . Palpitations 01/22/2017  . Dyspnea on exertion 01/22/2017  . CKD (chronic kidney disease) stage 3, GFR 30-59 ml/min (HCC) 10/18/2016  . Non-compliance 10/18/2016  . Chronic ethmoidal sinusitis 10/18/2016  . Hepatitis C, chronic ([place]) 05/15/2012  . Diabetes mellitus type 2, insulin dependent ([place]) 05/15/2012  . Hypertension 05/15/2012  . Anemia 05/15/2012   Gwendolyn Grant, PT, DPT, ATC 07/19/20 12:42 PM  [facility] [address] [place], [place], [address] Phone: [phone]   Fax:  [phone]  Name: Maureen Barnes MRN: [id number] Date of Birth: 04/04/1961

## 2020-07-21 ENCOUNTER — Other Ambulatory Visit: Payer: Self-pay

## 2020-07-21 ENCOUNTER — Ambulatory Visit: Payer: Medicare Other

## 2020-07-21 DIAGNOSIS — M6281 Muscle weakness (generalized): Secondary | ICD-10-CM

## 2020-07-21 DIAGNOSIS — M79672 Pain in left foot: Secondary | ICD-10-CM

## 2020-07-21 DIAGNOSIS — R2689 Other abnormalities of gait and mobility: Secondary | ICD-10-CM

## 2020-07-21 NOTE — Therapy (Signed)
[place], [place], [address] Phone: [phone]   Fax:  [phone]  Physical Therapy Treatment  Patient Details  Name: Maureen Barnes MRN: [id number] Date of Birth: 1960/10/21 Referring Provider (PT): Lanae Crumbly, DPM   Encounter Date: 07/21/2020   PT End of Session - 07/21/20 1335    Visit Number 7    Number of Visits 10    Date for PT Re-Evaluation 08/06/20    Authorization Type UHC MCR/MCD    Progress Note Due on Visit 10    PT Start Time 1102    PT Stop Time 1145    PT Time Calculation (min) 43 min    Activity Tolerance Patient tolerated treatment well    Behavior During Therapy [facility] for tasks assessed/performed           Past Medical History:  Diagnosis Date  . Anemia   . Cervical disc disease   . Chronic kidney disease (CKD), stage V ([place])   . Diabetes mellitus   . Hep C w/o coma, chronic ([place])   . Hypertension   . Lumbar disc disease   . Peritoneal dialysis status ([place])   . Renal disorder     Past Surgical History:  Procedure Laterality Date  . TUBAL LIGATION      There were no vitals filed for this visit.   Subjective Assessment - 07/21/20 1106    Subjective "The foot has been good, it's about the same." "It felt pretty good after last session." She reports compliance with HEP.    Currently in Pain? Yes    Pain Score 4     Pain Location Ankle    Pain Orientation Right    Pain Type Chronic pain                             [facility] Adult PT Treatment/Exercise - 07/21/20 0001      Self-Care   Other Self-Care Comments  see patient education      Knee/Hip Exercises: Aerobic   Nustep 5 minute level 6      Knee/Hip Exercises: Machines for Strengthening   Total Gym Leg Press 2 x 10; 20 lbs    Other Machine calf raise 2 x 10 on total gym; 20 lbs      Knee/Hip Exercises: Sidelying   Hip ABduction Limitations 2 x 10 each    Hip ADduction Limitations  --      Manual Therapy   Manual therapy comments STM/TrP release to ant/post tib, gastroc/soleus Lt                  PT Education - 07/21/20 1334    Education Details Education on updated HEP.    Person(s) Educated Patient    Methods Explanation;Demonstration;Handout    Comprehension Verbalized understanding;Returned demonstration            PT Short Term Goals - 06/25/20 1319      PT SHORT TERM GOAL #1   Title Pt will be independent with initial HEP    Baseline did not have time at eval    Time 3    Period Weeks    Status New    Target Date 07/16/20      PT SHORT TERM GOAL #2   Title Pt will report improvement in L LE ankle pain x 25% to assist with improved functional mobility    Baseline up to  7/10    Time 3    Period Weeks    Status New    Target Date 07/16/20             PT Long Term Goals - 06/25/20 1320      PT LONG TERM GOAL #1   Title Pt will be able to walk up and down stairs with 75% less pain    Baseline up to 7-8/10 pain    Time 6    Period Weeks    Status New    Target Date 08/06/20      PT LONG TERM GOAL #2   Title pt will be able to walk x 30 min with </= 3/10 L ankle pain to go shopping    Baseline unable    Time 6    Period Weeks    Status New    Target Date 08/06/20      PT LONG TERM GOAL #3   Title pt will have improved Foto score to 41% limited    Baseline 62% limitation    Time 6    Period Weeks    Status New    Target Date 08/06/20      PT LONG TERM GOAL #4   Title Pt will be able to stand to perform ADLS with 78% less difficulty    Baseline unable without difficulty    Time 6    Period Weeks    Status New    Target Date 08/06/20                 Plan - 07/21/20 1336    Clinical Impression Statement Today's session focused on generalized LE strengthening in NWB positioning with patient reporting difficulty with leg press and sidelying hip abduction (Lt>Rt). She reported mild pain about Lt posterior ankle  with calf raises on leg press, though otherwise tolerated strengthening exercises well. She continues to have multiple trigger points that are tender to palpation about Lt gastroc with partial release from manual therapy. Patient deferred dry needling during today's session, though is potentially interested in dry needling at future sessions.    Personal Factors and Comorbidities Comorbidity 1    Comorbidities Kidney transplant 06/09/20    Examination-Activity Limitations Squat;Stairs;Locomotion Level;Stand    Examination-Participation Restrictions Cleaning;Shop;Community Activity    Stability/Clinical Decision Making Evolving/Moderate complexity    Rehab Potential Good    PT Frequency 2x / week    PT Duration 6 weeks   sometimes pt states she can only come 1x/week   PT Treatment/Interventions Cryotherapy;Ultrasound;Moist Heat;Iontophoresis 4mg /ml Dexamethasone;Gait training;Stair training;Functional mobility training;Neuromuscular re-education;Balance training;Therapeutic exercise;Therapeutic activities;Patient/family education;Manual techniques;Passive range of motion;Dry needling;Taping;Joint Manipulations    PT Next Visit Plan Consider DN of posterior tib, gastroc/soleus.Progress LE strength. Nustep. static balance activity.    PT Home Exercise Plan [id number]    Consulted and Agree with Plan of Care Patient           Patient will benefit from skilled therapeutic intervention in order to improve the following deficits and impairments:  Abnormal gait,Decreased endurance,Decreased mobility,Difficulty walking,Hypomobility,Decreased range of motion,Decreased activity tolerance,Decreased strength,Impaired flexibility,Pain  Visit Diagnosis: Pain in left foot  Muscle weakness (generalized)  Other abnormalities of gait and mobility     Problem List Patient Active Problem List   Diagnosis Date Noted  . Anxiety 07/17/2018  . ESRD on peritoneal dialysis ([place]) 07/17/2018  . H/O cesarean  section 07/17/2018  . Evaluation by medical service required 04/02/2018  . Pre-transplant evaluation for end stage  renal disease 11/18/2017  . Unilateral inguinal hernia 11/18/2017  . Hyperlipidemia 01/22/2017  . Palpitations 01/22/2017  . Dyspnea on exertion 01/22/2017  . CKD (chronic kidney disease) stage 3, GFR 30-59 ml/min (HCC) 10/18/2016  . Non-compliance 10/18/2016  . Chronic ethmoidal sinusitis 10/18/2016  . Hepatitis C, chronic ([place]) 05/15/2012  . Diabetes mellitus type 2, insulin dependent ([place]) 05/15/2012  . Hypertension 05/15/2012  . Anemia 05/15/2012   Gwendolyn Grant, PT, DPT, ATC 07/21/20 1:46 PM  [facility] [address] [place], [place], [address] Phone: [phone]   Fax:  [phone]  Name: Maureen Barnes MRN: [id number] Date of Birth: Sep 29, 1960

## 2020-07-26 ENCOUNTER — Ambulatory Visit: Payer: Medicare Other | Attending: Podiatry

## 2020-07-26 ENCOUNTER — Other Ambulatory Visit: Payer: Self-pay

## 2020-07-26 DIAGNOSIS — M79672 Pain in left foot: Secondary | ICD-10-CM | POA: Diagnosis present

## 2020-07-26 DIAGNOSIS — M6281 Muscle weakness (generalized): Secondary | ICD-10-CM | POA: Insufficient documentation

## 2020-07-26 DIAGNOSIS — R2689 Other abnormalities of gait and mobility: Secondary | ICD-10-CM | POA: Diagnosis present

## 2020-07-26 NOTE — Therapy (Signed)
[place], [place], [address] Phone: [phone]   Fax:  [phone]  Physical Therapy Treatment  Patient Details  Name: Maureen Barnes MRN: [id number] Date of Birth: March 04, 1961 Referring Provider (PT): Lanae Crumbly, DPM   Encounter Date: 07/26/2020   PT End of Session - 07/26/20 1127    Visit Number 8    Number of Visits 10    Date for PT Re-Evaluation 08/06/20    Authorization Type UHC MCR/MCD    Progress Note Due on Visit 10    PT Start Time 1111    PT Stop Time 1146    PT Time Calculation (min) 35 min    Activity Tolerance Patient tolerated treatment well    Behavior During Therapy [facility] for tasks assessed/performed           Past Medical History:  Diagnosis Date  . Anemia   . Cervical disc disease   . Chronic kidney disease (CKD), stage V ([place])   . Diabetes mellitus   . Hep C w/o coma, chronic ([place])   . Hypertension   . Lumbar disc disease   . Peritoneal dialysis status ([place])   . Renal disorder     Past Surgical History:  Procedure Laterality Date  . TUBAL LIGATION      There were no vitals filed for this visit.   Subjective Assessment - 07/26/20 1114    Subjective Patient reports overall she is feeling "pretty good, not bad." The pain is getting less and less, but can still feel it going up and down stairs and pivoting.    Currently in Pain? No/denies                             [facility] Adult PT Treatment/Exercise - 07/26/20 0001      Exercises   Exercises Knee/Hip      Knee/Hip Exercises: Machines for Strengthening   Total Gym Leg Press 2 x 10; 20 lbs    Other Machine calf raise 2 x 10; 2 lbs      Knee/Hip Exercises: Standing   Forward Step Up Limitations 6 inch step 2 x 5 LLE      Manual Therapy   Manual therapy comments STM/TrP release to Lt gastroc.soleus, anterior and posterior tib. Passive gastroc stretch, joint mobilizations to promote ankle DF  grade III, taping to promote posterior glide of lateral malleoli                    PT Short Term Goals - 06/25/20 1319      PT SHORT TERM GOAL #1   Title Pt will be independent with initial HEP    Baseline did not have time at eval    Time 3    Period Weeks    Status New    Target Date 07/16/20      PT SHORT TERM GOAL #2   Title Pt will report improvement in L LE ankle pain x 25% to assist with improved functional mobility    Baseline up to 7/10    Time 3    Period Weeks    Status New    Target Date 07/16/20             PT Long Term Goals - 06/25/20 1320      PT LONG TERM GOAL #1   Title Pt will be able to walk up and down stairs  with 75% less pain    Baseline up to 7-8/10 pain    Time 6    Period Weeks    Status New    Target Date 08/06/20      PT LONG TERM GOAL #2   Title pt will be able to walk x 30 min with </= 3/10 L ankle pain to go shopping    Baseline unable    Time 6    Period Weeks    Status New    Target Date 08/06/20      PT LONG TERM GOAL #3   Title pt will have improved Foto score to 41% limited    Baseline 62% limitation    Time 6    Period Weeks    Status New    Target Date 08/06/20      PT LONG TERM GOAL #4   Title Pt will be able to stand to perform ADLS with 24% less difficulty    Baseline unable without difficulty    Time 6    Period Weeks    Status New    Target Date 08/06/20                 Plan - 07/26/20 1130    Clinical Impression Statement Improved tolerance to LE strengthening without reports of ankle while completing squat and calf raises on leg press machine. Patient reports sharp pain along lateral aspect of talus and distal peroneals during step up activity on LLE that was resolved with implementation of taping to promote posterior glide of lateral malleoli. She continues to have multiple trigger points about Lt gastroc and posterior tib and will potentially benefit from TPDN at future sessions.     Personal Factors and Comorbidities Comorbidity 1    Comorbidities Kidney transplant 06/09/20    Examination-Activity Limitations Squat;Stairs;Locomotion Level;Stand    Examination-Participation Restrictions Cleaning;Shop;Community Activity    Stability/Clinical Decision Making Evolving/Moderate complexity    Rehab Potential Good    PT Frequency 2x / week    PT Duration 6 weeks   sometimes pt states she can only come 1x/week   PT Treatment/Interventions Cryotherapy;Ultrasound;Moist Heat;Iontophoresis 4mg /ml Dexamethasone;Gait training;Stair training;Functional mobility training;Neuromuscular re-education;Balance training;Therapeutic exercise;Therapeutic activities;Patient/family education;Manual techniques;Passive range of motion;Dry needling;Taping;Joint Manipulations    PT Next Visit Plan Progress LE strength, assess response to taping. consider TPDN to gastroc. progress note    PT Home Exercise Plan [id number]    Consulted and Agree with Plan of Care Patient           Patient will benefit from skilled therapeutic intervention in order to improve the following deficits and impairments:  Abnormal gait,Decreased endurance,Decreased mobility,Difficulty walking,Hypomobility,Decreased range of motion,Decreased activity tolerance,Decreased strength,Impaired flexibility,Pain  Visit Diagnosis: Pain in left foot  Muscle weakness (generalized)  Other abnormalities of gait and mobility     Problem List Patient Active Problem List   Diagnosis Date Noted  . Anxiety 07/17/2018  . ESRD on peritoneal dialysis ([place]) 07/17/2018  . H/O cesarean section 07/17/2018  . Evaluation by medical service required 04/02/2018  . Pre-transplant evaluation for end stage renal disease 11/18/2017  . Unilateral inguinal hernia 11/18/2017  . Hyperlipidemia 01/22/2017  . Palpitations 01/22/2017  . Dyspnea on exertion 01/22/2017  . CKD (chronic kidney disease) stage 3, GFR 30-59 ml/min (HCC) 10/18/2016  .  Non-compliance 10/18/2016  . Chronic ethmoidal sinusitis 10/18/2016  . Hepatitis C, chronic ([place]) 05/15/2012  . Diabetes mellitus type 2, insulin dependent ([place]) 05/15/2012  . Hypertension 05/15/2012  . Anemia  05/15/2012   Gwendolyn Grant, PT, DPT, ATC 07/26/20 12:52 PM [facility] [address] [place], [place], [address] Phone: [phone]   Fax:  [phone]  Name: Maureen Barnes MRN: [id number] Date of Birth: 04-02-1961

## 2020-07-28 ENCOUNTER — Encounter: Payer: Self-pay | Admitting: Podiatry

## 2020-07-28 ENCOUNTER — Ambulatory Visit (INDEPENDENT_AMBULATORY_CARE_PROVIDER_SITE_OTHER): Payer: Medicare Other | Admitting: Podiatry

## 2020-07-28 ENCOUNTER — Ambulatory Visit: Payer: Medicare Other

## 2020-07-28 ENCOUNTER — Other Ambulatory Visit: Payer: Self-pay

## 2020-07-28 DIAGNOSIS — M216X2 Other acquired deformities of left foot: Secondary | ICD-10-CM | POA: Diagnosis not present

## 2020-07-28 DIAGNOSIS — R2689 Other abnormalities of gait and mobility: Secondary | ICD-10-CM

## 2020-07-28 DIAGNOSIS — M76822 Posterior tibial tendinitis, left leg: Secondary | ICD-10-CM | POA: Diagnosis not present

## 2020-07-28 DIAGNOSIS — M79672 Pain in left foot: Secondary | ICD-10-CM | POA: Diagnosis not present

## 2020-07-28 DIAGNOSIS — B351 Tinea unguium: Secondary | ICD-10-CM

## 2020-07-28 DIAGNOSIS — M21862 Other specified acquired deformities of left lower leg: Secondary | ICD-10-CM

## 2020-07-28 DIAGNOSIS — M6281 Muscle weakness (generalized): Secondary | ICD-10-CM

## 2020-07-28 MED ORDER — CICLOPIROX 8 % EX SOLN: Freq: Every day | CUTANEOUS | 4 refills | Status: AC

## 2020-07-28 NOTE — Progress Notes (Signed)
  Subjective:  Patient ID: MORNINGSTAR TOFT, female    DOB: 16-Dec-1960,  MRN: [id number]  Chief Complaint  Patient presents with  . Nail Problem    Right hallux. PT stated that she has pain in her nail she stated it feel like a throbbing sensation and it looks like the nail is split.    60 y.o. female returns with the above complaint. History confirmed with patient.  This is a new issue.  Started she thinks after a pedicure.  Physical therapy is going well and is helping with the left foot.  Objective:  Physical Exam: warm, good capillary refill, no trophic changes or ulcerative lesions, normal DP and PT pulses and normal sensory exam. Left Foot: pain along PT tendon in retromalleolar area and towards navicular, not tender at insertion. Unable to do single heel rise, able to do double  Right foot: Hallux nail with cracking, yellow discoloration and distal superficial white onychomycosis  Radiographs: X-ray of the left foot: no fracture, dislocation, swelling or degenerative changes noted , pes planus noted Assessment:   1. Onychomycosis   2. Posterior tibial tendinitis of left lower extremity   3. Gastrocnemius equinus of left lower extremity      Plan:  Patient was evaluated and treated and all questions answered.  -Discussed etiology treatments for onychomycosis with her.  Recommended topical treatment with ciclopirox.  Reviewed use.  Advised to avoid pedicures and nail polish at this time  -Continue physical therapy and treatment plan for posterior tibial tendinitis   No follow-ups on file.

## 2020-07-28 NOTE — Therapy (Signed)
[place], [place], [address] Phone: [phone]   Fax:  [phone]  Physical Therapy Treatment/Progress Note   Progress Note Reporting Period 06/25/20 to 07/29/19  See note below for Objective Data and Assessment of Progress/Goals.       Patient Details  Name: MCKENZYE CUTRIGHT MRN: [id number] Date of Birth: Jul 12, 1961 Referring Provider (PT): Lanae Crumbly, DPM   Encounter Date: 07/28/2020   PT End of Session - 07/28/20 1107    Visit Number 9    Number of Visits 17    Date for PT Re-Evaluation 08/27/20    Authorization Type UHC MCR/MCD    Progress Note Due on Visit 19    PT Start Time 1102    PT Stop Time 1145    PT Time Calculation (min) 43 min    Activity Tolerance Patient tolerated treatment well    Behavior During Therapy WFL for tasks assessed/performed           Past Medical History:  Diagnosis Date  . Anemia   . Cervical disc disease   . Chronic kidney disease (CKD), stage V ([place])   . Diabetes mellitus   . Hep C w/o coma, chronic ([place])   . Hypertension   . Lumbar disc disease   . Peritoneal dialysis status ([place])   . Renal disorder     Past Surgical History:  Procedure Laterality Date  . TUBAL LIGATION      There were no vitals filed for this visit.   Subjective Assessment - 07/28/20 1108    Subjective Patient reports subjective overall improvement of 70% reporting continued difficulty with going up/down stairs and pivoting as this causes increased pain about posterior ankle. Patient had f/u with physician today and was recommended to continue with PT and will f/u again in February. Patient reports compliance with HEP and taping that was applied at last session did not reduce her pain, but helped the ankle feel more stable.    Limitations Standing;Walking;House hold activities    How long can you sit comfortably? no concerns    How long can you stand comfortably? "maybe  about 15 minutes"    How long can you walk comfortably? "30 minutes-1 hour to walk my dog even if my ankle is hurting"    Diagnostic tests Xray demos no abnormalities or fractures    Patient Stated Goals to move around painfree    Currently in Pain? Yes    Pain Score 4     Pain Location Ankle    Pain Orientation Right;Posterior    Pain Descriptors / Indicators Dull    Pain Type Chronic pain    Pain Onset More than a month ago    Aggravating Factors  weightbearing activity    Pain Relieving Factors rest    Multiple Pain Sites No              [facility] PT Assessment - 07/28/20 0001      Observation/Other Assessments   Focus on Therapeutic Outcomes (FOTO)  59% function      AROM   Overall AROM Comments Lt AROM: Inversion 13, Eversion 8, PF 55, DF 7; Rt AROM DF 7      Strength   Overall Strength Comments L DF: 4-/5, Eversion 4/5, Inversion 3/5, PF 5/5 (in sitting) , Rt ankle 4+/5 MMT   pain with inversion and PF     Palpation   Palpation comment TTP Lt posterior tib, gastroc, plantar aspect  of the foot      Ambulation/Gait   Gait Comments excessive foot pronation and ER bilaterally, foot flat initial contact      Berg Balance Test   Sit to Stand Able to stand without using hands and stabilize independently    Standing Unsupported Able to stand safely 2 minutes    Sitting with Back Unsupported but Feet Supported on Floor or Stool Able to sit safely and securely 2 minutes    Stand to Sit Sits safely with minimal use of hands    Transfers Able to transfer safely, minor use of hands    Standing Unsupported with Eyes Closed Able to stand 10 seconds safely    Standing Unsupported with Feet Together Able to place feet together independently and stand 1 minute safely    From Standing, Reach Forward with Outstretched Arm Can reach forward >12 cm safely (5")    From Standing Position, Pick up Object from Floor Able to pick up shoe safely and easily    From Standing Position, Turn to Look  Behind Over each Shoulder Looks behind from both sides and weight shifts well    Turn 360 Degrees Able to turn 360 degrees safely in 4 seconds or less    Standing Unsupported, Alternately Place Feet on Step/Stool Able to stand independently and safely and complete 8 steps in 20 seconds    Standing Unsupported, One Foot in Front Able to place foot tandem independently and hold 30 seconds    Standing on One Leg Tries to lift leg/unable to hold 3 seconds but remains standing independently    Total Score 52                         [facility] Adult PT Treatment/Exercise - 07/28/20 0001      Self-Care   Other Self-Care Comments  see patient education      Knee/Hip Exercises: Aerobic   Nustep 5 minute level 6      Manual Therapy   Manual therapy comments STM/TrP release to Lt gastroc.soleus, anterior and posterior tib. Passive gastroc stretch,      Ankle Exercises: Standing   Other Standing Ankle Exercises resisted ankle DF and PF yellow TB 2 x 10                  PT Education - 07/28/20 1127    Education Details Education on FOTO results and overall progress. Education on POC moving forward.    Person(s) Educated Patient    Methods Explanation    Comprehension Verbalized understanding            PT Short Term Goals - 07/28/20 1434      PT SHORT TERM GOAL #1   Title Pt will be independent with initial HEP    Baseline did not have time at eval    Time 3    Period Weeks    Status Achieved    Target Date 07/16/20      PT SHORT TERM GOAL #2   Title Pt will report improvement in L LE ankle pain x 25% to assist with improved functional mobility    Baseline up to 7/10 with stairs, pivoting 07/28/20    Time 3    Period Weeks    Status On-going    Target Date 07/16/20             PT Long Term Goals - 07/28/20 1435      PT  LONG TERM GOAL #1   Title Pt will be able to walk up and down stairs with 75% less pain    Baseline up to 7/10 pain 07/28/20    Time 6     Period Weeks    Status On-going    Target Date 08/27/20      PT LONG TERM GOAL #2   Title pt will be able to walk x 30 min with </= 3/10 L ankle pain to go shopping    Baseline up to 7/10 pain 07/28/20    Time 6    Period Weeks    Status On-going    Target Date 08/27/20      PT LONG TERM GOAL #3   Title pt will have improved Foto score to 41% limited    Baseline 69% function 07/28/20    Time 6    Period Weeks    Status Achieved    Target Date 08/27/20      PT LONG TERM GOAL #4   Title Pt will be able to stand to perform ADLS with 02% less difficulty    Baseline unable without difficulty    Time 6    Period Weeks    Status On-going    Target Date 08/27/20                 Plan - 07/28/20 1126    Clinical Impression Statement Patient has made gradual gains in Lt ankle AROM and strength since initiation of care. While she is tolerating gradual progression into standing/functional activity and her static balance has much improved, her signs and symptoms are consistent with posterior tibialis tendonitis as she reports moderate pain with reisted plantarflexion and inversion and continues to have difficulty with stair negotation secondary to pain. She could potentially benefit from incorporation of iontophoresis to assist in pain reduction and continued TPDN to reduce trigger points/muscle tautness about posterior tibialis and gastroc/soleus complex in addition to continued emphasis on improving her overall ankle stability and ROM. She has previously received TPDN to the posterior tib at one previous session that helped to reduce muscle tautness, though likely requires continued intervention due to severity of trigger points/tautness throughout lower leg musculature. She will benefit from continued care to address ongoing pain, plantarflexor musculature restrictions, ankle strength, postural stability, and gait mechanics in order to return to optimal function.    Personal Factors and  Comorbidities Comorbidity 1    Comorbidities Kidney transplant 06/09/20    Examination-Activity Limitations Squat;Stairs;Locomotion Level;Stand    Examination-Participation Restrictions Cleaning;Shop;Community Activity    Stability/Clinical Decision Making Evolving/Moderate complexity    Rehab Potential Good    PT Frequency 2x / week    PT Duration 4 weeks   sometimes pt states she can only come 1x/week   PT Treatment/Interventions Cryotherapy;Ultrasound;Moist Heat;Iontophoresis 4mg /ml Dexamethasone;Gait training;Stair training;Functional mobility training;Neuromuscular re-education;Balance training;Therapeutic exercise;Therapeutic activities;Patient/family education;Manual techniques;Passive range of motion;Dry needling;Taping;Joint Manipulations;ADLs/Self Care Home Management;Electrical Stimulation;Vasopneumatic Device    PT Next Visit Plan consider ionto, consider TPDN to posterior tib, gastroc/soleus, Update HEP.    PT Home Exercise Plan [id number]    Consulted and Agree with Plan of Care Patient           Patient will benefit from skilled therapeutic intervention in order to improve the following deficits and impairments:  Abnormal gait,Decreased endurance,Decreased mobility,Difficulty walking,Hypomobility,Decreased range of motion,Decreased activity tolerance,Decreased strength,Impaired flexibility,Pain  Visit Diagnosis: Pain in left foot  Muscle weakness (generalized)  Other abnormalities of gait and mobility  Problem List Patient Active Problem List   Diagnosis Date Noted  . Anxiety 07/17/2018  . ESRD on peritoneal dialysis ([place]) 07/17/2018  . H/O cesarean section 07/17/2018  . Evaluation by medical service required 04/02/2018  . Pre-transplant evaluation for end stage renal disease 11/18/2017  . Unilateral inguinal hernia 11/18/2017  . Hyperlipidemia 01/22/2017  . Palpitations 01/22/2017  . Dyspnea on exertion 01/22/2017  . CKD (chronic kidney disease) stage 3, GFR  30-59 ml/min ([facility]) 10/18/2016  . Non-compliance 10/18/2016  . Chronic ethmoidal sinusitis 10/18/2016  . Hepatitis C, chronic ([place]) 05/15/2012  . Diabetes mellitus type 2, insulin dependent ([place]) 05/15/2012  . Hypertension 05/15/2012  . Anemia 05/15/2012  Gwendolyn Grant, PT, DPT, ATC 07/28/20 9:48 PM  [facility] [address] [place], [place], [address] Phone: [phone]   Fax:  [phone]  Name: JACQUELYNN FRIEND MRN: [id number] Date of Birth: 29-Oct-1960

## 2020-08-17 ENCOUNTER — Other Ambulatory Visit: Payer: Self-pay

## 2020-08-17 ENCOUNTER — Ambulatory Visit: Payer: Medicare Other

## 2020-08-17 DIAGNOSIS — M79672 Pain in left foot: Secondary | ICD-10-CM

## 2020-08-17 DIAGNOSIS — R2689 Other abnormalities of gait and mobility: Secondary | ICD-10-CM

## 2020-08-17 DIAGNOSIS — M6281 Muscle weakness (generalized): Secondary | ICD-10-CM

## 2020-08-17 NOTE — Therapy (Signed)
[place], [place], [address] Phone: [phone]   Fax:  [phone]  Physical Therapy Treatment  Patient Details  Name: Maureen Barnes MRN: [id number] Date of Birth: 01/26/1961 Referring Provider (PT): Lanae Crumbly, DPM   Encounter Date: 08/17/2020   PT End of Session - 08/17/20 1250    Visit Number 10    Number of Visits 17    Date for PT Re-Evaluation 08/27/20    Authorization Type UHC MCR/MCD    Progress Note Due on Visit 77    PT Start Time 1240   patient late   PT Stop Time 1315    PT Time Calculation (min) 35 min    Activity Tolerance Patient tolerated treatment well    Behavior During Therapy [facility] for tasks assessed/performed           Past Medical History:  Diagnosis Date  . Anemia   . Cervical disc disease   . Chronic kidney disease (CKD), stage V ([place])   . Diabetes mellitus   . Hep C w/o coma, chronic ([place])   . Hypertension   . Lumbar disc disease   . Peritoneal dialysis status ([place])   . Renal disorder     Past Surgical History:  Procedure Laterality Date  . TUBAL LIGATION      There were no vitals filed for this visit.   Subjective Assessment - 08/17/20 1310    Subjective Patient reports her ankle is feeling about the same still having pain with stair negotiation, pivoting, and walking her dog at worst 7/10 around the entire ankle. She reports compliance with HEP.    Currently in Pain? No/denies              [facility] PT Assessment - 08/17/20 0001      Strength   Overall Strength Comments L DF: 4-/5, Eversion 4/5, Inversion 3/5, PF 5/5 (in sitting) , Rt ankle 4+/5 MMT   pain with inversion and PF                        [facility] Adult PT Treatment/Exercise - 08/17/20 0001      Self-Care   Other Self-Care Comments  see patient education      Ankle Exercises: Machines for Strengthening   Cybex Leg Press calf raise 2 x 10 each inversion and eversion       Ankle Exercises: Seated   Other Seated Ankle Exercises towel slides inversion and eversion x2 each      Ankle Exercises: Supine   Isometrics eversion and inversion 2 x 10    T-Band dorsiflexion and plantarflexion red TB 2 x 10                  PT Education - 08/17/20 1307    Education Details updated HEP.    Person(s) Educated Patient    Methods Explanation;Demonstration;Handout;Verbal cues    Comprehension Verbalized understanding;Returned demonstration;Verbal cues required            PT Short Term Goals - 08/17/20 1255      PT SHORT TERM GOAL #1   Title Pt will be independent with initial HEP    Baseline did not have time at eval    Time 3    Period Weeks    Status Achieved    Target Date 07/16/20      PT SHORT TERM GOAL #2   Title Pt will report improvement in L  LE ankle pain x 25% to assist with improved functional mobility    Baseline up to 7/10 with stairs, pivoting 08/17/20    Time 3    Period Weeks    Status On-going    Target Date 07/16/20             PT Long Term Goals - 07/28/20 1435      PT LONG TERM GOAL #1   Title Pt will be able to walk up and down stairs with 75% less pain    Baseline up to 7/10 pain 07/28/20    Time 6    Period Weeks    Status On-going    Target Date 08/27/20      PT LONG TERM GOAL #2   Title pt will be able to walk x 30 min with </= 3/10 L ankle pain to go shopping    Baseline up to 7/10 pain 07/28/20    Time 6    Period Weeks    Status On-going    Target Date 08/27/20      PT LONG TERM GOAL #3   Title pt will have improved Foto score to 41% limited    Baseline 69% function 07/28/20    Time 6    Period Weeks    Status Achieved    Target Date 08/27/20      PT LONG TERM GOAL #4   Title Pt will be able to stand to perform ADLS with 09% less difficulty    Baseline unable without difficulty    Time 6    Period Weeks    Status On-going    Target Date 08/27/20                 Plan - 08/17/20 1302     Clinical Impression Statement Patient's ankle strength remains the same since last session with most notable weakness in ankle invertors and evertors. Session today focused on ankle strengthening with patient challenged in performing ankle invertor and evertor strengthening/ROM exercises as she has tendency to compensate with hip/knee movement. No reports of pain throughout session, though patient reported fatigue in the Lt ankle at end of session.    Personal Factors and Comorbidities Comorbidity 1    Comorbidities Kidney transplant 06/09/20    Examination-Activity Limitations Squat;Stairs;Locomotion Level;Stand    Examination-Participation Restrictions Cleaning;Shop;Community Activity    Stability/Clinical Decision Making Evolving/Moderate complexity    Rehab Potential Good    PT Frequency 2x / week    PT Duration 4 weeks   sometimes pt states she can only come 1x/week   PT Treatment/Interventions Cryotherapy;Ultrasound;Moist Heat;Iontophoresis 4mg /ml Dexamethasone;Gait training;Stair training;Functional mobility training;Neuromuscular re-education;Balance training;Therapeutic exercise;Therapeutic activities;Patient/family education;Manual techniques;Passive range of motion;Dry needling;Taping;Joint Manipulations;ADLs/Self Care Home Management;Electrical Stimulation;Vasopneumatic Device    PT Next Visit Plan consider ionto, consider TPDN to posterior tib, gastroc/soleus, progress ankle strengthening invertors and evertors    PT Home Exercise Plan [email or web address]    Consulted and Agree with Plan of Care Patient           Patient will benefit from skilled therapeutic intervention in order to improve the following deficits and impairments:  Abnormal gait,Decreased endurance,Decreased mobility,Difficulty walking,Hypomobility,Decreased range of motion,Decreased activity tolerance,Decreased strength,Impaired flexibility,Pain  Visit Diagnosis: Pain in left foot  Muscle weakness (generalized)  Other  abnormalities of gait and mobility     Problem List Patient Active Problem List   Diagnosis Date Noted  . Anxiety 07/17/2018  . ESRD on peritoneal dialysis ([place]) 07/17/2018  . H/O cesarean section  07/17/2018  . Evaluation by medical service required 04/02/2018  . Pre-transplant evaluation for end stage renal disease 11/18/2017  . Unilateral inguinal hernia 11/18/2017  . Hyperlipidemia 01/22/2017  . Palpitations 01/22/2017  . Dyspnea on exertion 01/22/2017  . CKD (chronic kidney disease) stage 3, GFR 30-59 ml/min (HCC) 10/18/2016  . Non-compliance 10/18/2016  . Chronic ethmoidal sinusitis 10/18/2016  . Hepatitis C, chronic ([place]) 05/15/2012  . Diabetes mellitus type 2, insulin dependent ([place]) 05/15/2012  . Hypertension 05/15/2012  . Anemia 05/15/2012   Gwendolyn Grant, PT, DPT, ATC 08/17/20 1:23 PM [facility] [address] [place], [place], [address] Phone: [phone]   Fax:  [phone]  Name: Maureen Barnes MRN: [id number] Date of Birth: 04/19/1961

## 2020-08-19 ENCOUNTER — Ambulatory Visit: Payer: Medicare Other | Admitting: Physical Therapy

## 2020-08-24 ENCOUNTER — Ambulatory Visit: Payer: Medicare Other | Attending: Podiatry | Admitting: Physical Therapy

## 2020-08-24 ENCOUNTER — Other Ambulatory Visit: Payer: Self-pay

## 2020-08-24 DIAGNOSIS — R2689 Other abnormalities of gait and mobility: Secondary | ICD-10-CM | POA: Diagnosis present

## 2020-08-24 DIAGNOSIS — M6281 Muscle weakness (generalized): Secondary | ICD-10-CM | POA: Insufficient documentation

## 2020-08-24 DIAGNOSIS — M79672 Pain in left foot: Secondary | ICD-10-CM | POA: Diagnosis present

## 2020-08-24 NOTE — Therapy (Signed)
[place], [place], [address] Phone: [phone]   Fax:  [phone]  Physical Therapy Treatment  Patient Details  Name: Maureen Barnes MRN: [id number] Date of Birth: [birth year] Referring Provider (PT): Lanae Crumbly, DPM   Encounter Date: 08/24/2020   PT End of Session - 08/24/20 1058    Visit Number 11    Number of Visits 17    Date for PT Re-Evaluation 08/27/20    Authorization Type UHC MCR/MCD    Progress Note Due on Visit 19    PT Start Time 1015    PT Stop Time 1058    PT Time Calculation (min) 43 min    Activity Tolerance Patient tolerated treatment well    Behavior During Therapy [facility] for tasks assessed/performed           Past Medical History:  Diagnosis Date  . Anemia   . Cervical disc disease   . Chronic kidney disease (CKD), stage V ([place])   . Diabetes mellitus   . Hep C w/o coma, chronic ([place])   . Hypertension   . Lumbar disc disease   . Peritoneal dialysis status ([place])   . Renal disorder     Past Surgical History:  Procedure Laterality Date  . TUBAL LIGATION      There were no vitals filed for this visit.   Subjective Assessment - 08/24/20 1021    Subjective " I missed up the time frame for the last visit and I ended up coming late. Everything is doing okay, I am just realizing my sinus are bothering me more."    Patient Stated Goals to move around painfree    Currently in Pain? Yes    Pain Score 3     Pain Location Ankle    Pain Orientation Left    Pain Descriptors / Indicators Dull    Pain Onset More than a month ago    Pain Frequency Intermittent    Aggravating Factors  standing/ walking,    Pain Relieving Factors resting              [facility] PT Assessment - 08/24/20 0001      Assessment   Medical Diagnosis L posterior tibial tendonitis, bil pes planus, gastroc equinus of L LE    Referring Provider (PT) Lanae Crumbly, DPM                          [facility] Adult PT Treatment/Exercise - 08/24/20 0001      Knee/Hip Exercises: Stretches   Gastroc Stretch 2 reps;30 seconds;Left      Manual Therapy   Manual Therapy Soft tissue mobilization;Other (comment)    Manual therapy comments [email or web address]    Soft tissue mobilization IASTM along the posterior tib    Other Manual Therapy arch support tape L ankle only      Ankle Exercises: Standing   Other Standing Ankle Exercises posterior tib strengthening 2 x 15 with green band            Trigger Point Dry Needling - 08/24/20 0001    Consent Given? Yes    Education Handout Provided Previously provided    Electrical Stimulation Performed with Dry Needling Yes    E-stim with Dry Needling Details frequency to 20, increasing intensisity PRN x 8 min    Posterior tibialis Response Twitch response elicited;Palpable increased muscle length  PT Short Term Goals - 08/17/20 1255      PT SHORT TERM GOAL #1   Title Pt will be independent with initial HEP    Baseline did not have time at eval    Time 3    Period Weeks    Status Achieved    Target Date 07/16/20      PT SHORT TERM GOAL #2   Title Pt will report improvement in L LE ankle pain x 25% to assist with improved functional mobility    Baseline up to 7/10 with stairs, pivoting 08/17/20    Time 3    Period Weeks    Status On-going    Target Date 07/16/20             PT Long Term Goals - 07/28/20 1435      PT LONG TERM GOAL #1   Title Pt will be able to walk up and down stairs with 75% less pain    Baseline up to 7/10 pain 07/28/20    Time 6    Period Weeks    Status On-going    Target Date 08/27/20      PT LONG TERM GOAL #2   Title pt will be able to walk x 30 min with </= 3/10 L ankle pain to go shopping    Baseline up to 7/10 pain 07/28/20    Time 6    Period Weeks    Status On-going    Target Date 08/27/20      PT LONG TERM GOAL #3   Title pt will have  improved Foto score to 41% limited    Baseline 69% function 07/28/20    Time 6    Period Weeks    Status Achieved    Target Date 08/27/20      PT LONG TERM GOAL #4   Title Pt will be able to stand to perform ADLS with 49% less difficulty    Baseline unable without difficulty    Time 6    Period Weeks    Status On-going    Target Date 08/27/20                 Plan - 08/24/20 1055    Clinical Impression Statement pt reports continued pain inthe foot today. continued TPDN combined with E-stim focusing on the posterior tib followed with IASTM techinques and stretch. she responed well to posterior tib strengthening.  trialed arch support low dye tape which she reported significant relief with standing / walking.    PT Treatment/Interventions Cryotherapy;Ultrasound;Moist Heat;Iontophoresis 4mg /ml Dexamethasone;Gait training;Stair training;Functional mobility training;Neuromuscular re-education;Balance training;Therapeutic exercise;Therapeutic activities;Patient/family education;Manual techniques;Passive range of motion;Dry needling;Taping;Joint Manipulations;ADLs/Self Care Home Management;Electrical Stimulation;Vasopneumatic Device    PT Next Visit Plan consider ionto, consider TPDN to posterior tib, gastroc/soleus, progress ankle strengthening invertors and evertors, responsed to arch support taping.    PT Home Exercise Plan [email or web address]    Consulted and Agree with Plan of Care Patient           Patient will benefit from skilled therapeutic intervention in order to improve the following deficits and impairments:  Abnormal gait,Decreased endurance,Decreased mobility,Difficulty walking,Hypomobility,Decreased range of motion,Decreased activity tolerance,Decreased strength,Impaired flexibility,Pain  Visit Diagnosis: Pain in left foot  Muscle weakness (generalized)  Other abnormalities of gait and mobility     Problem List Patient Active Problem List   Diagnosis Date Noted  .  Anxiety 07/17/2018  . ESRD on peritoneal dialysis ([place]) 07/17/2018  . H/O cesarean section 07/17/2018  .  Evaluation by medical service required 04/02/2018  . Pre-transplant evaluation for end stage renal disease 11/18/2017  . Unilateral inguinal hernia 11/18/2017  . Hyperlipidemia 01/22/2017  . Palpitations 01/22/2017  . Dyspnea on exertion 01/22/2017  . CKD (chronic kidney disease) stage 3, GFR 30-59 ml/min (HCC) 10/18/2016  . Non-compliance 10/18/2016  . Chronic ethmoidal sinusitis 10/18/2016  . Hepatitis C, chronic ([place]) 05/15/2012  . Diabetes mellitus type 2, insulin dependent ([place]) 05/15/2012  . Hypertension 05/15/2012  . Anemia 05/15/2012    Starr Lake PT, DPT, LAT, ATC  08/24/20  10:59 AM      [facility] [address] [place], [place], [address] Phone: [phone]   Fax:  [phone]  Name: Maureen Barnes MRN: [id number] Date of Birth: 01-28-1961

## 2020-08-25 ENCOUNTER — Ambulatory Visit (INDEPENDENT_AMBULATORY_CARE_PROVIDER_SITE_OTHER): Payer: Medicare Other | Admitting: Podiatry

## 2020-08-25 DIAGNOSIS — M21861 Other specified acquired deformities of right lower leg: Secondary | ICD-10-CM

## 2020-08-25 DIAGNOSIS — M722 Plantar fascial fibromatosis: Secondary | ICD-10-CM | POA: Diagnosis not present

## 2020-08-25 DIAGNOSIS — M76822 Posterior tibial tendinitis, left leg: Secondary | ICD-10-CM

## 2020-08-25 DIAGNOSIS — M216X2 Other acquired deformities of left foot: Secondary | ICD-10-CM | POA: Diagnosis not present

## 2020-08-25 DIAGNOSIS — M216X1 Other acquired deformities of right foot: Secondary | ICD-10-CM

## 2020-08-25 DIAGNOSIS — M21862 Other specified acquired deformities of left lower leg: Secondary | ICD-10-CM

## 2020-08-25 NOTE — Patient Instructions (Signed)
Continue with therapy for both ankles and heels

## 2020-08-26 ENCOUNTER — Ambulatory Visit: Payer: Medicare Other | Admitting: Physical Therapy

## 2020-08-26 ENCOUNTER — Encounter: Payer: Self-pay | Admitting: Physical Therapy

## 2020-08-26 ENCOUNTER — Other Ambulatory Visit: Payer: Self-pay

## 2020-08-26 DIAGNOSIS — M6281 Muscle weakness (generalized): Secondary | ICD-10-CM

## 2020-08-26 DIAGNOSIS — R2689 Other abnormalities of gait and mobility: Secondary | ICD-10-CM

## 2020-08-26 DIAGNOSIS — M79672 Pain in left foot: Secondary | ICD-10-CM | POA: Diagnosis not present

## 2020-08-26 NOTE — Patient Instructions (Signed)
Access Code: [email or web address] URL: [email or web address] Date: 08/26/2020 Prepared by: Starr Lake  Exercises Seated Plantar Fascia Mobilization with Small Ball - 2 x daily - 7 x weekly - 3 sets - 30 sec hold Arch Lifting - 2 x daily - 7 x weekly - 3 sets - 10 reps Standing Gastroc Stretch - 2 x daily - 7 x weekly - 3 sets - 30 sec hold Standing Soleus Stretch - 2 x daily - 7 x weekly - 3 sets - 30 sec hold Standing Romberg to 1/2 Tandem Stance - 2 x daily - 7 x weekly - 3 sets - 30 sec hold Sidelying Hip Abduction - 1 x daily - 7 x weekly - 2 sets - 10 reps Long Sitting Ankle Dorsiflexion with Anchored Resistance - 1 x daily - 7 x weekly - 2 sets - 10 reps Long Sitting Ankle Plantar Flexion with Resistance - 1 x daily - 7 x weekly - 2 sets - 10 reps Ankle Inversion Eversion Towel Slide - 1 x daily - 7 x weekly - 2 sets - 10 reps Isometric Ankle Inversion - 1 x daily - 7 x weekly - 2 sets - 10 reps Isometric Ankle Eversion at Wall - 1 x daily - 7 x weekly - 2 sets - 10 reps Sit to Stand Without Arm Support - 1 x daily - 7 x weekly - 2 sets - 10 reps Standing Hip Abduction with Counter Support - 1 x daily - 7 x weekly - 2 sets - 15 reps Standing Hip Extension with Counter Support - 1 x daily - 7 x weekly - 2 sets - 15 reps

## 2020-08-26 NOTE — Therapy (Signed)
[place], [place], [address] Phone: [phone]   Fax:  [phone]  Physical Therapy Treatment / Discharge  Patient Details  Name: Maureen Barnes MRN: [id number] Date of Birth: September 07, 1960 Referring Provider (PT): Lanae Crumbly, DPM   Encounter Date: 08/26/2020   PT End of Session - 08/26/20 1108    Visit Number 12    Number of Visits 17    Date for PT Re-Evaluation 08/27/20    Authorization Type UHC MCR/MCD    Progress Note Due on Visit 45    PT Start Time 1108   pt checked in early but had to use the restroom   PT Stop Time 1135    PT Time Calculation (min) 27 min    Activity Tolerance Patient tolerated treatment well    Behavior During Therapy [facility] for tasks assessed/performed           Past Medical History:  Diagnosis Date  . Anemia   . Cervical disc disease   . Chronic kidney disease (CKD), stage V ([place])   . Diabetes mellitus   . Hep C w/o coma, chronic ([place])   . Hypertension   . Lumbar disc disease   . Peritoneal dialysis status ([place])   . Renal disorder     Past Surgical History:  Procedure Laterality Date  . TUBAL LIGATION      There were no vitals filed for this visit.   Subjective Assessment - 08/26/20 1109    Subjective " I am unsure if the tape made me sore so I don't know if it helped or not.    Currently in Pain? Yes    Pain Score 3     Pain Location Ankle    Pain Orientation Left    Pain Descriptors / Indicators Sore    Pain Type Chronic pain    Pain Onset More than a month ago    Pain Frequency Intermittent    Aggravating Factors  standing/ walking                                     PT Education - 08/26/20 1140    Education Details Reviewed/ updated HEP today. discussed proper progression of strengthening with gradual increase of reps/ sets and resistance to promote strength, endurance and stability.    Person(s) Educated Patient     Methods Explanation;Verbal cues;Handout    Comprehension Verbalized understanding;Verbal cues required            PT Short Term Goals - 08/26/20 1113      PT SHORT TERM GOAL #1   Title Pt will be independent with initial HEP    Period Weeks    Status Achieved      PT SHORT TERM GOAL #2   Title Pt will report improvement in L LE ankle pain x 25% to assist with improved functional mobility    Baseline 97% improved per pt report    Period Weeks    Status Achieved             PT Long Term Goals - 08/26/20 1114      PT LONG TERM GOAL #1   Title Pt will be able to walk up and down stairs with 75% less pain    Baseline pt repots 97-98% better    Period Weeks    Status Achieved  PT LONG TERM GOAL #2   Title pt will be able to walk x 30 min with </= 3/10 L ankle pain to go shopping    Baseline states able to walk 30 min , pain increases to 5-6/10    Period Weeks    Status Partially Met      PT LONG TERM GOAL #3   Title pt will have improved Foto score to 41% limited    Period Weeks    Status Achieved      PT LONG TERM GOAL #4   Title Pt will be able to stand to perform ADLS with 25% less difficulty    Period Weeks    Status Achieved                 Plan - 08/26/20 1135    Clinical Impression Statement pt has made progress with physical therapy reporting decreaesd pain. She continues to report intermittent pain that increaed with prolonged standing/ walking likely related to endurnace. She has met or partially met all goals. increased time take to review her HEp and how to properly progress strengthening to promote endurance. At this point pt has the capability to perform her exercises at home to build strength and will be discharged from PT today. pt agreed with this plan.    PT Treatment/Interventions Cryotherapy;Ultrasound;Moist Heat;Iontophoresis 4m/ml Dexamethasone;Gait training;Stair training;Functional mobility training;Neuromuscular re-education;Balance  training;Therapeutic exercise;Therapeutic activities;Patient/family education;Manual techniques;Passive range of motion;Dry needling;Taping;Joint Manipulations;ADLs/Self Care Home Management;Electrical Stimulation;Vasopneumatic Device    PT Next Visit Plan DC    PT Home Exercise Plan [email or web address]    Consulted and Agree with Plan of Care Patient           Patient will benefit from skilled therapeutic intervention in order to improve the following deficits and impairments:  Abnormal gait,Decreased endurance,Decreased mobility,Difficulty walking,Hypomobility,Decreased range of motion,Decreased activity tolerance,Decreased strength,Impaired flexibility,Pain  Visit Diagnosis: Pain in left foot  Muscle weakness (generalized)  Other abnormalities of gait and mobility     Problem List Patient Active Problem List   Diagnosis Date Noted  . Anxiety 07/17/2018  . [place] 07/17/2018  . H/O cesarean section 07/17/2018  . Evaluation by medical service required 04/02/2018  . Pre-transplant evaluation for end stage renal disease 11/18/2017  . Unilateral inguinal hernia 11/18/2017  . Hyperlipidemia 01/22/2017  . Palpitations 01/22/2017  . Dyspnea on exertion 01/22/2017  . CKD (chronic kidney disease) stage 3, GFR 30-59 ml/min (HCC) 10/18/2016  . Non-compliance 10/18/2016  . Chronic ethmoidal sinusitis 10/18/2016  . Hepatitis C, chronic ([place] 05/15/2012  . Diabetes mellitus type 2, insulin dependent ([place] 05/15/2012  . Hypertension 05/15/2012  . Anemia 05/15/2012   KStarr LakePT, DPT, LAT, ATC  08/26/20  11:41 AM      [facility]GN[address] [address]Phone: [phone]  Fax:  [phone] Name: Maureen DARNELLMRN: [id number] of Birth: [birth year]      PHYSICAL THERAPY DISCHARGE SUMMARY  Visits from Start of Care: 12  Current functional level related to goals /  functional outcomes: See goals, FOTO    Remaining deficits: Limited endurnace with prolonged standing/ walking   Education / Equipment: HEP, theraband, posture, progression of strength  Plan: Patient agrees to discharge.  Patient goals were partially met. Patient is being discharged due to being pleased with the current functional level.  ?????         Delshon Blanchfield PT, DPT, LAT, ATC  08/26/20  11:41 AM

## 2020-08-28 ENCOUNTER — Encounter: Payer: Self-pay | Admitting: Podiatry

## 2020-08-28 NOTE — Progress Notes (Signed)
  Subjective:  Patient ID: Maureen Barnes, female    DOB: November 19, 1960,  MRN: [id number]  Chief Complaint  Patient presents with  . Foot Pain    Left foot pain on the inner and outer part of the foot. PT stated that she feels like therapy is going good for her.    60 y.o. female returns with the above complaint. History confirmed with patient.  She is doing quite well.  Therapy has helped her a lot  Objective:  Physical Exam: warm, good capillary refill, no trophic changes or ulcerative lesions, normal DP and PT pulses and normal sensory exam. Left Foot: Today minimal pain along PT tendon in retromalleolar area or at navicular insertion.  Strength 5 out of 5 Right foot: Hallux nail with cracking, yellow discoloration and distal superficial white onychomycosis, improving with proximal clearing  Radiographs: X-ray of the left foot: no fracture, dislocation, swelling or degenerative changes noted , pes planus noted Assessment:   1. Gastrocnemius equinus of left lower extremity   2. Gastrocnemius equinus of right lower extremity   3. Plantar fasciitis of left foot   4. Posterior tibial tendinitis of left lower extremity      Plan:  Patient was evaluated and treated and all questions answered.  -Discussed etiology treatments for onychomycosis with her.  Recommended topical treatment with ciclopirox.  Reviewed use.  Advised to avoid pedicures and nail polish at this time  -Continue physical therapy and treatment plan for posterior tibial tendinitis, I think this will resolve soon with further therapy   Return in about 6 weeks (around 10/06/2020).

## 2020-09-15 ENCOUNTER — Other Ambulatory Visit: Payer: Self-pay | Admitting: Nurse Practitioner

## 2020-09-15 DIAGNOSIS — Z1231 Encounter for screening mammogram for malignant neoplasm of breast: Secondary | ICD-10-CM

## 2020-10-06 ENCOUNTER — Ambulatory Visit (INDEPENDENT_AMBULATORY_CARE_PROVIDER_SITE_OTHER): Payer: Medicare Other | Admitting: Podiatry

## 2020-10-06 ENCOUNTER — Other Ambulatory Visit: Payer: Self-pay

## 2020-10-06 DIAGNOSIS — M62462 Contracture of muscle, left lower leg: Secondary | ICD-10-CM

## 2020-10-06 DIAGNOSIS — M62461 Contracture of muscle, right lower leg: Secondary | ICD-10-CM

## 2020-10-06 DIAGNOSIS — M7662 Achilles tendinitis, left leg: Secondary | ICD-10-CM

## 2020-10-06 DIAGNOSIS — M216X1 Other acquired deformities of right foot: Secondary | ICD-10-CM

## 2020-10-06 DIAGNOSIS — M216X2 Other acquired deformities of left foot: Secondary | ICD-10-CM

## 2020-10-06 DIAGNOSIS — M21862 Other specified acquired deformities of left lower leg: Secondary | ICD-10-CM

## 2020-10-06 DIAGNOSIS — M76822 Posterior tibial tendinitis, left leg: Secondary | ICD-10-CM | POA: Diagnosis not present

## 2020-10-06 DIAGNOSIS — M21861 Other specified acquired deformities of right lower leg: Secondary | ICD-10-CM

## 2020-10-10 NOTE — Progress Notes (Signed)
  Subjective:  Patient ID: Maureen Barnes, female    DOB: Jan 20, 1961,  MRN: [id number]  Chief Complaint  Patient presents with  . Foot Pain    Pt stated that overall her foot is doing a lot better     60 y.o. female returns with the above complaint. History confirmed with patient.  She is doing quite well.  Therapy has helped her a lot  Objective:  Physical Exam: warm, good capillary refill, no trophic changes or ulcerative lesions, normal DP and PT pulses and normal sensory exam. Left Foot: Today minimal pain along PT tendon in retromalleolar area or at navicular insertion.  Minimal pain in the Achilles strength 5 out of 5 Right foot: Hallux nail with cracking, yellow discoloration and distal superficial white onychomycosis, improving with proximal clearing  Radiographs: X-ray of the left foot: no fracture, dislocation, swelling or degenerative changes noted , pes planus noted Assessment:   1. Gastrocnemius equinus of left lower extremity   2. Gastrocnemius equinus of right lower extremity   3. Posterior tibial tendinitis of left lower extremity   4. Achilles tendinitis, left leg      Plan:  Patient was evaluated and treated and all questions answered.  -Continue ciclopirox for nail fungus  -Continue physical therapy and treatment plan for PT tendinitis and Achilles, expected will be resolved by next visit   Return in about 12 weeks (around 12/29/2020) for re-check Achilles tendon.

## 2020-12-08 ENCOUNTER — Ambulatory Visit
Admission: RE | Admit: 2020-12-08 | Discharge: 2020-12-08 | Disposition: A | Discharge status: Home / Self Care | Payer: Medicare Other | Source: Ambulatory Visit | Attending: Nurse Practitioner | Admitting: Nurse Practitioner

## 2020-12-08 ENCOUNTER — Other Ambulatory Visit: Payer: Self-pay

## 2020-12-08 DIAGNOSIS — Z1231 Encounter for screening mammogram for malignant neoplasm of breast: Secondary | ICD-10-CM

## 2020-12-29 ENCOUNTER — Other Ambulatory Visit: Payer: Self-pay

## 2020-12-29 ENCOUNTER — Ambulatory Visit (INDEPENDENT_AMBULATORY_CARE_PROVIDER_SITE_OTHER): Payer: Medicare Other | Admitting: Podiatry

## 2020-12-29 DIAGNOSIS — M216X2 Other acquired deformities of left foot: Secondary | ICD-10-CM

## 2020-12-29 DIAGNOSIS — M21862 Other specified acquired deformities of left lower leg: Secondary | ICD-10-CM

## 2020-12-29 DIAGNOSIS — M7662 Achilles tendinitis, left leg: Secondary | ICD-10-CM

## 2020-12-29 DIAGNOSIS — M76822 Posterior tibial tendinitis, left leg: Secondary | ICD-10-CM

## 2021-01-01 ENCOUNTER — Encounter: Payer: Self-pay | Admitting: Podiatry

## 2021-01-01 NOTE — Progress Notes (Signed)
  Subjective:  Patient ID: Maureen Barnes, female    DOB: November 10, 1960,  MRN: [id number]  Chief Complaint  Patient presents with   Tendonitis    Achilles tendonitis left    60 y.o. female returns with the above complaint. History confirmed with patient.  Overall doing well and she is essentially pain-free now  Objective:  Physical Exam: warm, good capillary refill, no trophic changes or ulcerative lesions, normal DP and PT pulses and normal sensory exam. Left Foot: Today she has no pain along PT tendon in retromalleolar area or at navicular insertion.  No pain in the Achilles strength 5 out of 5 Right foot: Hallux nail with cracking, yellow discoloration and distal superficial white onychomycosis, improving with proximal clearing  Radiographs: X-ray of the left foot: no fracture, dislocation, swelling or degenerative changes noted , pes planus noted Assessment:   1. Achilles tendinitis, left leg   2. Posterior tibial tendinitis of left lower extremity   3. Gastrocnemius equinus of left lower extremity       Plan:  Patient was evaluated and treated and all questions answered.  -Continue ciclopirox for nail fungus  -Achilles is essentially healed at this point, return if needed if recurs   Return if symptoms worsen or fail to improve.

## 2021-01-05 ENCOUNTER — Other Ambulatory Visit: Payer: Self-pay | Admitting: Physician Assistant

## 2021-01-05 ENCOUNTER — Other Ambulatory Visit: Payer: Self-pay

## 2021-01-05 ENCOUNTER — Encounter: Payer: Self-pay | Admitting: Physician Assistant

## 2021-01-05 ENCOUNTER — Ambulatory Visit (INDEPENDENT_AMBULATORY_CARE_PROVIDER_SITE_OTHER): Payer: Medicare Other | Admitting: Physician Assistant

## 2021-01-05 ENCOUNTER — Ambulatory Visit (INDEPENDENT_AMBULATORY_CARE_PROVIDER_SITE_OTHER): Payer: Medicare Other

## 2021-01-05 VITALS — BP 120/80 | HR 82 | Ht 63.0 in | Wt 178.8 lb

## 2021-01-05 DIAGNOSIS — R002 Palpitations: Secondary | ICD-10-CM | POA: Diagnosis not present

## 2021-01-05 DIAGNOSIS — E785 Hyperlipidemia, unspecified: Secondary | ICD-10-CM

## 2021-01-05 DIAGNOSIS — I10 Essential (primary) hypertension: Secondary | ICD-10-CM | POA: Diagnosis not present

## 2021-01-05 DIAGNOSIS — E119 Type 2 diabetes mellitus without complications: Secondary | ICD-10-CM | POA: Diagnosis not present

## 2021-01-05 DIAGNOSIS — Z940 Kidney transplant status: Secondary | ICD-10-CM

## 2021-01-05 MED ORDER — METOPROLOL SUCCINATE ER 50 MG PO TB24: ORAL_TABLET | ORAL | 1 refills | Status: DC

## 2021-01-05 NOTE — Patient Instructions (Signed)
Medication Instructions:  INCREASE Metoprolol Succinate (Toprol-XL) to 50 mg in the morning and 25 mg (half tablet) in the evenings.  *If you need a refill on your cardiac medications before your next appointment, please call your pharmacy*  Lab Work: NONE ordered at this time of appointment   If you have labs (blood work) drawn today and your tests are completely normal, you will receive your results only by: [place] (if you have MyChart) OR A paper copy in the mail If you have any lab test that is abnormal or we need to change your treatment, we will call you to review the results.  Testing/Procedures:  Bryn Gulling- Long Term Monitor Instructions  Your physician has requested you wear a ZIO patch monitor for 3 days.  This is a single patch monitor. [employer] supplies one patch monitor per enrollment. Additional stickers are not available. Please do not apply patch if you will be having a Nuclear Stress Test,  Echocardiogram, Cardiac CT, MRI, or Chest Xray during the period you would be wearing the  monitor. The patch cannot be worn during these tests. You cannot remove and re-apply the  ZIO XT patch monitor.  Your ZIO patch monitor will be mailed 3 day USPS to your address on file. It may take 3-5 days  to receive your monitor after you have been enrolled.  Once you have received your monitor, please review the enclosed instructions. Your monitor  has already been registered assigning a specific monitor serial # to you.  Billing and Patient Assistance Program Information  We have supplied [employer] with any of your insurance information on file for billing purposes. [employer] offers a sliding scale Patient Assistance Program for patients that do not have  insurance, or whose insurance does not completely cover the cost of the ZIO monitor.  You must apply for the Patient Assistance Program to qualify for this discounted rate.  To apply, please call [employer] at [phone], select  option 4, select option 2, ask to apply for  Patient Assistance Program. Theodore Demark will ask your household income, and how many people  are in your household. They will quote your out-of-pocket cost based on that information.  [employer] will also be able to set up a 74-month, interest-free payment plan if needed.  Applying the monitor   Shave hair from upper left chest.  Hold abrader disc by orange tab. Rub abrader in 40 strokes over the upper left chest as  indicated in your monitor instructions.  Clean area with 4 enclosed alcohol pads. Let dry.  Apply patch as indicated in monitor instructions. Patch will be placed under collarbone on left  side of chest with arrow pointing upward.  Rub patch adhesive wings for 2 minutes. Remove white label marked "1". Remove the white  label marked "2". Rub patch adhesive wings for 2 additional minutes.  While looking in a mirror, press and release button in center of patch. A small green light will  flash 3-4 times. This will be your only indicator that the monitor has been turned on.  Do not shower for the first 24 hours. You may shower after the first 24 hours.  Press the button if you feel a symptom. You will hear a small click. Record Date, Time and  Symptom in the Patient Logbook.  When you are ready to remove the patch, follow instructions on the last 2 pages of Patient  Logbook. Stick patch monitor onto the last page of Patient Logbook.  Place Patient  Logbook in the blue and white box. Use locking tab on box and tape box closed  securely. The blue and white box has prepaid postage on it. Please place it in the mailbox as  soon as possible. Your physician should have your test results approximately 7 days after the  monitor has been mailed back to [facility].  Call [place] at [phone] if you have questions regarding  your ZIO XT patch monitor. Call them immediately if you see an orange light blinking on your  monitor.   If your monitor falls off in less than 4 days, contact our Monitor department at [phone].  If your monitor becomes loose or falls off after 4 days call [employer] at [phone] for  suggestions on securing your monitor   Follow-Up: At [facility], you and your health needs are our priority.  As part of our continuing mission to provide you with exceptional heart care, we have created designated Provider Care Teams.  These Care Teams include your primary Cardiologist (physician) and Advanced Practice Providers (APPs -  Physician Assistants and Nurse Practitioners) who all work together to provide you with the care you need, when you need it.  Your next appointment:   8 week(s)  The format for your next appointment:   In Person  Provider:   Quay Burow, MD or Almyra Deforest, PA-C   Other Instructions

## 2021-01-05 NOTE — Progress Notes (Unsigned)
Enrolled patient for a 3 day Zio XT monitor to be mailed to patients home   Dr. Gwenlyn Found to read

## 2021-01-05 NOTE — Progress Notes (Signed)
Cardiology Office Note:    Date:  01/07/2021   ID:  Maureen Barnes, DOB 07-Feb-1961, MRN [id number]  PCP:  Maureen Harvey, NP   [facility] Providers Cardiologist:  Maureen Burow, MD {  Referring MD: Maureen Harvey, NP   Chief Complaint  Patient presents with   Follow-up    Seen for Dr. Gwenlyn Barnes    History of Present Illness:    Maureen Barnes is a 60 y.o. female with a hx of hypertension, hyperlipidemia, DM 2, ESRD s/p renal transplant 02/28/2019 on chronic immunosuppression with Prograf and prednisone, hepatitis C s/p treatment in 2012 and palpitation.  Echocardiogram obtained in July 2018 showed EF 60 to 65%, no regional wall motion abnormality, grade 2 DD, mild MR.  She had significant hypotension on carvedilol in the past dialysis was switched to atenolol.  Her last visit was with Maureen Busing, NP on 01/28/2018 at which time she was doing well.  Echocardiogram obtained on 01/20/2020 showed EF 60 to 65%, normal LV wall thickness, no significant valve issue.  Patient presents today for evaluation of palpitation.  Her palpitation has been going on for several weeks.  She usually notices racing and pounding sensation in the chest in the morning after she wake up.  Symptom usually persist for the remainder of the day and is slowly get better by evening time.  It is accompanied by shaky feeling and blurred vision.  She is currently taking Toprol-XL 50 mg every morning.  I recommend an additional 25 mg Toprol-XL every night.  I also recommended a 3-day heart monitor as well.  Recent TSH was normal.  CBC and a basic metabolic panel was normal as well.  Lipid panel shows elevated triglyceride but normal total cholesterol and HDL.  Otherwise she denies any chest discomfort or significant worsening shortness of breath.   Past Medical History:  Diagnosis Date   Anemia    Cervical disc disease    Chronic kidney disease (CKD), stage V (HCC)    Diabetes mellitus    Hep C w/o coma,  chronic (HCC)    Hypertension    Lumbar disc disease    Peritoneal dialysis status ([place])    Renal disorder     Past Surgical History:  Procedure Laterality Date   TUBAL LIGATION      Current Medications: Current Meds  Medication Sig   amLODipine (NORVASC) 5 MG tablet Take 5 mg by mouth daily.   aspirin EC 81 MG tablet Take 81 mg by mouth daily.   atorvastatin (LIPITOR) 20 MG tablet Take 1 tablet by mouth at bedtime.   b complex-vitamin c-folic acid (NEPHRO-VITE) 0.8 MG TABS tablet Take 1 tablet by mouth daily.   ciclopirox (PENLAC) 8 % solution Apply topically at bedtime. Apply over nail and surrounding skin. Apply daily over previous coat. After seven (7) days, may remove with alcohol and continue cycle.   cinacalcet (SENSIPAR) 30 MG tablet TAKE 1 TABLET BY MOUTH EVERY OTHER DAY THREE DAYS A WEEK (3 TIMES A WEEK)   Continuous Blood Gluc Receiver (DEXCOM G6 RECEIVER) DEVI Use as directed for continuous glucose monitoring.   Continuous Blood Gluc Sensor (DEXCOM G6 SENSOR) MISC Inject 1 sensor to the skin every 10 days for continuous glucose monitoring.   Continuous Blood Gluc Transmit (DEXCOM G6 TRANSMITTER) MISC Use as directed for continuous glucose monitoring. Reuse transmitter for 90 days then discard and replace.   diphenhydrAMINE (BENADRYL) 25 MG tablet Take 1 tablet (25 mg total) by  mouth at bedtime as needed.   docusate sodium (COLACE) 100 MG capsule Take 100 mg by mouth 2 (two) times daily.   ENVARSUS XR 1 MG TB24 Take 1 tablet by mouth daily as needed.   ENVARSUS XR 4 MG TB24 Take 2 tablets by mouth daily.   famotidine (PEPCID) 10 MG tablet Take 10 mg by mouth 2 (two) times daily.   fluticasone (FLONASE) 50 MCG/ACT nasal spray Place 2 sprays into both nostrils daily.   HUMALOG 100 UNIT/ML injection 100 Units.   insulin glargine (LANTUS) 100 UNIT/ML injection Inject 20 Units into the skin daily. As Needed   loratadine (CLARITIN) 10 MG tablet Take 10 mg by mouth as directed.    mycophenolate (MYFORTIC) 180 MG EC tablet Take 180 mg by mouth in the morning, at noon, and at bedtime.   omeprazole (PRILOSEC) 40 MG capsule Take 20 mg by mouth daily. Takes 20 MG daily   ondansetron (ZOFRAN) 4 MG tablet Take 1 tablet (4 mg total) by mouth every 8 (eight) hours as needed for nausea or vomiting.   pantoprazole (PROTONIX) 40 MG tablet Take 40 mg by mouth daily. As Needed   predniSONE (DELTASONE) 5 MG tablet Take 5 mg by mouth daily.   Probiotic Product (PROBIOTIC PEARLS ADVANTAGE PO) Take by mouth.   ranitidine (ZANTAC) 150 MG capsule Take 150 mg by mouth every evening.   simethicone (MYLICON) 80 MG chewable tablet Chew 80 mg by mouth every 6 (six) hours as needed for flatulence.   sulfamethoxazole-trimethoprim (BACTRIM DS) 800-160 MG tablet 1 tablet   tiZANidine (ZANAFLEX) 2 MG tablet Take by mouth.   [DISCONTINUED] atorvastatin (LIPITOR) 40 MG tablet Take 40 mg by mouth daily.   [DISCONTINUED] calcitRIOL (ROCALTROL) 0.25 MCG capsule Take 0.25 mcg by mouth daily. Take 2 caps daily   [DISCONTINUED] carvedilol (COREG) 12.5 MG tablet Take 12.5 mg by mouth 2 (two) times daily with a meal.   [DISCONTINUED] ferrous sulfate 325 (65 FE) MG tablet Take by mouth.   [DISCONTINUED] gentamicin cream (GARAMYCIN) 0.1 % APPLY SMALL AMOUNT TO EXIT SITE DAILY   [DISCONTINUED] HYDROcodone-acetaminophen (NORCO/VICODIN) 5-325 MG tablet TAKE 1 TABLET BY MOUTH EVERY 4 HOURS AS NEEDED FOR UP TO 5 DAYS.   [DISCONTINUED] Insulin Glargine (BASAGLAR KWIKPEN) 100 UNIT/ML SOPN Inject 20 Units into the skin daily.   [DISCONTINUED] Insulin Glargine (LANTUS SOLOSTAR [place]) Inject 26 Units into the skin daily.   [DISCONTINUED] lactulose, encephalopathy, (CHRONULAC) 10 GM/15ML SOLN Take 10 g by mouth.   [DISCONTINUED] losartan-hydrochlorothiazide (HYZAAR) 100-25 MG tablet Take 1 tablet by mouth daily.   [DISCONTINUED] metoprolol succinate (TOPROL-XL) 50 MG 24 hr tablet Take 50 mg by mouth daily. Take with or  immediately following a meal.     Allergies:   Peanut oil, Shellfish-derived products, Iodine, Peanut-containing drug products, and Shellfish allergy   Social History   Socioeconomic History   Marital status: Single    Spouse name: Not on file   Number of children: Not on file   Years of education: Not on file   Highest education level: Not on file  Occupational History   Not on file  Tobacco Use   Smoking status: Never   Smokeless tobacco: Never  Substance and Sexual Activity   Alcohol use: No   Drug use: No   Sexual activity: Not Currently    Birth control/protection: Surgical  Other Topics Concern   Not on file  Social History Narrative   Not on file   Social Determinants of  Health   Financial Resource Strain: Not on file  Food Insecurity: Not on file  Transportation Needs: Not on file  Physical Activity: Not on file  Stress: Not on file  Social Connections: Not on file     Family History: The patient's family history includes Breast cancer in her mother; Cancer in her mother; Diabetes in her father; Hypertension in her father and mother.  ROS:   Please see the history of present illness.     All other systems reviewed and are negative.  EKGs/Labs/Other Studies Reviewed:    The following studies were reviewed today:  Echo 02/04/2017 LV EF: 60% -   65%   -------------------------------------------------------------------  Indications:      (R06.00 Dyspnea. R00.2 Palpitations).   -------------------------------------------------------------------  History:   Risk factors:  Family history of coronary artery  disease. Hypertension. Diabetes mellitus. Dyslipidemia.   -------------------------------------------------------------------  Study Conclusions   - Left ventricle: The cavity size was normal. Wall thickness was    increased in a pattern of mild LVH. Systolic function was normal.    The estimated ejection fraction was in the range of 60% to 65%.     Wall motion was normal; there were no regional wall motion    abnormalities. Features are consistent with a pseudonormal left    ventricular filling pattern, with concomitant abnormal relaxation    and increased filling pressure (grade 2 diastolic dysfunction).  - Mitral valve: There was mild regurgitation.   EKG:  EKG is ordered today.  The ekg ordered today demonstrates normal sinus rhythm, poor R wave progression in the anterior leads.  Recent Labs: No results Barnes for requested labs within last 8760 hours.  Recent Lipid Panel No results Barnes for: CHOL, TRIG, HDL, CHOLHDL, VLDL, LDLCALC, LDLDIRECT   Risk Assessment/Calculations:       Physical Exam:    VS:  BP 120/80 (BP Location: Left Arm)   Pulse 82   Ht 5\' 3"  (1.6 m)   Wt 178 lb 12.8 oz (81.1 kg)   LMP 07/10/2011   BMI 31.67 kg/m     Wt Readings from Last 3 Encounters:  01/05/21 178 lb 12.8 oz (81.1 kg)  12/27/18 172 lb (78 kg)  12/25/18 173 lb (78.5 kg)     GEN:  Well nourished, well developed in no acute distress HEENT: Normal NECK: No JVD; No carotid bruits LYMPHATICS: No lymphadenopathy CARDIAC: RRR, no murmurs, rubs, gallops RESPIRATORY:  Clear to auscultation without rales, wheezing or rhonchi  ABDOMEN: Soft, non-tender, non-distended MUSCULOSKELETAL:  No edema; No deformity  SKIN: Warm and dry NEUROLOGIC:  Alert and oriented x 3 PSYCHIATRIC:  Normal affect   ASSESSMENT:    1. Palpitations   2. Primary hypertension   3. Hyperlipidemia LDL goal <70   4. Controlled type 2 diabetes mellitus without complication, without long-term current use of insulin ([place])   5. Renal transplant recipient    PLAN:    In order of problems listed above:  Palpitation: Increase Toprol-XL to 50 mg a.m. and 25 mg p.m.  Obtain 3-day heart monitor.  Her symptoms usually start early in the morning when she wake up.  Some of which may be related to increased cortisol level and catecholamine release in the morning.  I  suspect the heart monitor will reveal sinus tachycardia early in the morning instead of any irregular rhythm  Hypertension: Blood pressure stable on current therapy, will add additional half a tablet of Toprol-XL at night to help with palpitation  Hyperlipidemia:  On Lipitor  DM2: Managed by primary care provider  History of renal transplant: Renal function stable on the last lab work.     Medication Adjustments/Labs and Tests Ordered: Current medicines are reviewed at length with the patient today.  Concerns regarding medicines are outlined above.  Orders Placed This Encounter  Procedures   LONG TERM MONITOR (3-14 DAYS)   EKG 12-Lead   Meds ordered this encounter  Medications   metoprolol succinate (TOPROL-XL) 50 MG 24 hr tablet    Sig: Take by mouth 50 mg in the mornings and 25 mg (half tablet) in the evenings    Dispense:  270 tablet    Refill:  1    Dose change new Rx    Patient Instructions  Medication Instructions:  INCREASE Metoprolol Succinate (Toprol-XL) to 50 mg in the morning and 25 mg (half tablet) in the evenings.  *If you need a refill on your cardiac medications before your next appointment, please call your pharmacy*  Lab Work: NONE ordered at this time of appointment   If you have labs (blood work) drawn today and your tests are completely normal, you will receive your results only by: [place] (if you have MyChart) OR A paper copy in the mail If you have any lab test that is abnormal or we need to change your treatment, we will call you to review the results.  Testing/Procedures:  Bryn Gulling- Long Term Monitor Instructions  Your physician has requested you wear a ZIO patch monitor for 3 days.  This is a single patch monitor. [employer] supplies one patch monitor per enrollment. Additional stickers are not available. Please do not apply patch if you will be having a Nuclear Stress Test,  Echocardiogram, Cardiac CT, MRI, or Chest Xray during the period  you would be wearing the  monitor. The patch cannot be worn during these tests. You cannot remove and re-apply the  ZIO XT patch monitor.  Your ZIO patch monitor will be mailed 3 day USPS to your address on file. It may take 3-5 days  to receive your monitor after you have been enrolled.  Once you have received your monitor, please review the enclosed instructions. Your monitor  has already been registered assigning a specific monitor serial # to you.  Billing and Patient Assistance Program Information  We have supplied [employer] with any of your insurance information on file for billing purposes. [employer] offers a sliding scale Patient Assistance Program for patients that do not have  insurance, or whose insurance does not completely cover the cost of the ZIO monitor.  You must apply for the Patient Assistance Program to qualify for this discounted rate.  To apply, please call [employer] at [phone], select option 4, select option 2, ask to apply for  Patient Assistance Program. Theodore Demark will ask your household income, and how many people  are in your household. They will quote your out-of-pocket cost based on that information.  [employer] will also be able to set up a 56-month, interest-free payment plan if needed.  Applying the monitor   Shave hair from upper left chest.  Hold abrader disc by orange tab. Rub abrader in 40 strokes over the upper left chest as  indicated in your monitor instructions.  Clean area with 4 enclosed alcohol pads. Let dry.  Apply patch as indicated in monitor instructions. Patch will be placed under collarbone on left  side of chest with arrow pointing upward.  Rub patch adhesive wings for 2 minutes. Remove  white label marked "1". Remove the white  label marked "2". Rub patch adhesive wings for 2 additional minutes.  While looking in a mirror, press and release button in center of patch. A small green light will  flash 3-4 times. This will be your only indicator  that the monitor has been turned on.  Do not shower for the first 24 hours. You may shower after the first 24 hours.  Press the button if you feel a symptom. You will hear a small click. Record Date, Time and  Symptom in the Patient Logbook.  When you are ready to remove the patch, follow instructions on the last 2 pages of Patient  Logbook. Stick patch monitor onto the last page of Patient Logbook.  Place Patient Logbook in the blue and white box. Use locking tab on box and tape box closed  securely. The blue and white box has prepaid postage on it. Please place it in the mailbox as  soon as possible. Your physician should have your test results approximately 7 days after the  monitor has been mailed back to [facility].  Call [place] at [phone] if you have questions regarding  your ZIO XT patch monitor. Call them immediately if you see an orange light blinking on your  monitor.  If your monitor falls off in less than 4 days, contact our Monitor department at [phone].  If your monitor becomes loose or falls off after 4 days call [employer] at [phone] for  suggestions on securing your monitor   Follow-Up: At [facility], you and your health needs are our priority.  As part of our continuing mission to provide you with exceptional heart care, we have created designated Provider Care Teams.  These Care Teams include your primary Cardiologist (physician) and Advanced Practice Providers (APPs -  Physician Assistants and Nurse Practitioners) who all work together to provide you with the care you need, when you need it.  Your next appointment:   8 week(s)  The format for your next appointment:   In Person  Provider:   Quay Burow, MD or Almyra Deforest, PA-C   Other Instructions    Signed, Almyra Deforest, [place]  01/07/2021 11:15 PM    [place]

## 2021-01-07 ENCOUNTER — Encounter: Payer: Self-pay | Admitting: Physician Assistant

## 2021-01-08 DIAGNOSIS — R002 Palpitations: Secondary | ICD-10-CM | POA: Diagnosis not present

## 2021-01-17 ENCOUNTER — Ambulatory Visit: Payer: Medicare Other | Attending: Obstetrics and Gynecology | Admitting: Physical Therapy

## 2021-01-17 ENCOUNTER — Other Ambulatory Visit: Payer: Self-pay

## 2021-01-17 DIAGNOSIS — M6281 Muscle weakness (generalized): Secondary | ICD-10-CM | POA: Diagnosis present

## 2021-01-17 DIAGNOSIS — R279 Unspecified lack of coordination: Secondary | ICD-10-CM | POA: Diagnosis present

## 2021-01-17 DIAGNOSIS — R293 Abnormal posture: Secondary | ICD-10-CM | POA: Diagnosis present

## 2021-01-17 NOTE — Therapy (Signed)
[facility] [address], [place], [place], [address] Phone: [phone]   Fax:  [phone]  Physical Therapy Evaluation  Patient Details  Name: Maureen Barnes MRN: [id number] Date of Birth: 12-30-1960 Referring Provider (PT): Drema Dallas, DO   Encounter Date: 01/17/2021   PT End of Session - 01/17/21 0957     Visit Number 1    Date for PT Re-Evaluation 04/11/21    Authorization Type UHC    PT Start Time 0920    PT Stop Time 1013    PT Time Calculation (min) 53 min    Activity Tolerance Patient tolerated treatment well    Behavior During Therapy WFL for tasks assessed/performed             Past Medical History:  Diagnosis Date   Anemia    Cervical disc disease    Chronic kidney disease (CKD), stage V ([place])    Diabetes mellitus    Hep C w/o coma, chronic ([place])    Hypertension    Lumbar disc disease    Peritoneal dialysis status ([place])    Renal disorder     Past Surgical History:  Procedure Laterality Date   TUBAL LIGATION      There were no vitals filed for this visit.    Subjective Assessment - 01/17/21 0923     Subjective Pt states pain was on the left mid pelvic region more anterior of the pelvic floor.  Pt is holding her left pec region also due to straining.  Pt states carrying bags up the steps to her doorway and goes up a hill + 2 flights x 7 steps.  Pt has a dog and when the dog pulls has some pain.  Gets close to 64 oz/day of water.    Pertinent History kidney transplant    Limitations Standing    Patient Stated Goals reduce leak and urge and pain    Currently in Pain? Yes    Pain Score 8     Pain Location Perineum    Pain Orientation Left    Pain Descriptors / Indicators Aching;Sharp    Pain Type Acute pain    Pain Onset More than a month ago    Pain Frequency Intermittent    Aggravating Factors  standing, walking, stairs    Pain Relieving Factors heat pad, lying down     Effect of Pain on Daily Activities doing chores    Multiple Pain Sites No                [facility] PT Assessment - 01/17/21 0001       Assessment   Medical Diagnosis R10.2 (ICD-10-CM) - Pelvic and perineal pain    Referring Provider (PT) Drema Dallas, DO    Onset Date/Surgical Date --   few months   Prior Therapy No      Precautions   Precautions None      Balance Screen   Has the patient fallen in the past 6 months No      [place] residence    Living Arrangements Alone      Prior Function   Level of [place] --   house work   Leisure shopping and walking      Cognition   Overall Cognitive Status Within Functional Limits for tasks assessed      Functional Tests   Functional tests Single leg stance;Squat  Squat   Comments leans to the Rt      Single Leg Stance   Comments trendelenburg      Posture/Postural Control   Posture/Postural Control Postural limitations    Postural Limitations Decreased thoracic kyphosis;Forward head;Rounded Shoulders;Anterior pelvic tilt      ROM / Strength   AROM / PROM / Strength PROM;AROM      AROM   Overall AROM Comments lumbar flexion 50% due to lumbar and hamstring tension      PROM   Overall PROM Comments bil hip 80%      Flexibility   Soft Tissue Assessment /Muscle Length yes    Hamstrings Rt 70%; Lt 80%      Palpation   Palpation comment tight lumbar and hamstrings      Ambulation/Gait   Gait Pattern Within Functional Limits                        Objective measurements completed on examination: See above findings.     Pelvic Floor Special Questions - 01/17/21 0001     Prior Pelvic/Prostate Exam Yes    Are you Pregnant or attempting pregnancy? No    Prior Pregnancies Yes    Number of Pregnancies 3    Number of C-Sections 1    Number of Vaginal Deliveries 2    Any difficulty with labor and deliveries --   straining  during the second delivery   Currently Sexually Active No    Urinary Leakage Yes    How often almost every time    Activities that cause leaking With strong urge    Urinary urgency Yes    Urinary frequency more than normal - 2 morning, then 1-2 hours in the morning will go 5x; wake up 3x/night    Fluid intake 64 oz/day    Caffeine beverages coffee    Falling out feeling (prolapse) No    Skin Integrity Intact    External Palpation no tenderness    Prolapse None    Pelvic Floor Internal Exam pt identity confirmed and internal soft tissue assessment performed    Exam Type Vaginal    Palpation TTP Lt >Rt levators and trigger point mid muscle belly; very hard time coordinating muscles to contract and relax, holding breath    Strength Flicker    Strength # of reps 1    Strength # of seconds 1    Tone high                        PT Short Term Goals - 01/17/21 1000       PT SHORT TERM GOAL #1   Title Pt will be independent with initial HEP    Time 4    Period Weeks    Status New    Target Date 02/14/21      PT SHORT TERM GOAL #2   Title ind with urge techniques    Time 4    Period Weeks    Status New               PT Long Term Goals - 01/17/21 0240       PT LONG TERM GOAL #1   Title Pt will be able to walk up and down stairs with 75% less pain    Time 12    Period Weeks    Status New    Target Date 04/11/21  PT LONG TERM GOAL #2   Title Pt will be ind with HEP to maintain strength    Time 12    Period Weeks    Status New    Target Date 04/11/21      PT LONG TERM GOAL #3   Title Pt will report at least 75% less leakage due to improved urgency    Time 12    Period Weeks    Status New    Target Date 04/11/21                    Plan - 01/17/21 1000     Clinical Impression Statement Pt presents to skilled PT due to pelvic pain, urgency, and leakage.  Pt has LE weakness noted with functional tasks such as SLS and squat.  She has  trendelenburg from hip weakness and shifting weight to the Rt LE due to more weakness of Lt LE.  Pt has posture abdnormalities as noted and decreased lumbar flexion and decreased hip ROM.  Pt has very guarded and painful pelvic floor muscles throughout the levators.  Lt side has more tenderness and more prominent trigger point in levators muscles.  Pt has 1/5 MMT appears to be due to high tone.  Pt will benefit from skilled PT to address these and all impairments mentioned above for imporved function and reduced pain    Personal Factors and Comorbidities Comorbidity 3+    Comorbidities kidney transplant, CKD, c-sectionx1; vaginal delivery x 2    Examination-Activity Limitations Carry;Continence;Toileting;Stand;Sleep    Examination-Participation Restrictions Laundry;Community Activity    Stability/Clinical Decision Making Evolving/Moderate complexity    Clinical Decision Making Moderate    Rehab Potential Excellent    PT Frequency 1x / week    PT Duration 12 weeks    PT Treatment/Interventions ADLs/Self Care Home Management;Biofeedback;Cryotherapy;Electrical Stimulation;Moist Heat;Neuromuscular re-education;Therapeutic exercise;Therapeutic activities;Patient/family education;Manual techniques;Passive range of motion;Dry needling;Taping    PT Next Visit Plan urge techniques, h/s and single knee stretch, hip flexor stretch,    Consulted and Agree with Plan of Care Patient             Patient will benefit from skilled therapeutic intervention in order to improve the following deficits and impairments:  Pain, Decreased strength, Postural dysfunction, Increased fascial restricitons, Impaired flexibility, Increased muscle spasms, Decreased coordination  Visit Diagnosis: Abnormal posture  Muscle weakness (generalized)  Unspecified lack of coordination     Problem List Patient Active Problem List   Diagnosis Date Noted   Anxiety 07/17/2018   ESRD on peritoneal dialysis ([place]) 07/17/2018    H/O cesarean section 07/17/2018   Evaluation by medical service required 04/02/2018   Pre-transplant evaluation for end stage renal disease 11/18/2017   Unilateral inguinal hernia 11/18/2017   Hyperlipidemia 01/22/2017   Palpitations 01/22/2017   Dyspnea on exertion 01/22/2017   CKD (chronic kidney disease) stage 3, GFR 30-59 ml/min (HCC) 10/18/2016   Non-compliance 10/18/2016   Chronic ethmoidal sinusitis 10/18/2016   Hepatitis C, chronic ([place]) 05/15/2012   Diabetes mellitus type 2, insulin dependent ([place]) 05/15/2012   Hypertension 05/15/2012   Anemia 05/15/2012    Camillo Flaming Nicollette Wilhelmi, PT 01/17/2021, 5:35 PM  [facility] [address] [place], [place], [address] Phone: [phone]   Fax:  [phone]  Name: Maureen Barnes MRN: [id number] Date of Birth: 02-Jul-1961

## 2021-01-20 NOTE — Progress Notes (Signed)
Reassuring study, heart rate does increase slightly around 7AM, but overall heart rate is still within normal range. No sign of irregular rhythm. Some premature beats, but considered benign

## 2021-01-24 ENCOUNTER — Other Ambulatory Visit: Payer: Self-pay

## 2021-01-24 ENCOUNTER — Encounter: Payer: Self-pay | Admitting: Physical Therapy

## 2021-01-24 ENCOUNTER — Ambulatory Visit: Payer: Medicare Other | Attending: Obstetrics and Gynecology | Admitting: Physical Therapy

## 2021-01-24 DIAGNOSIS — R279 Unspecified lack of coordination: Secondary | ICD-10-CM | POA: Insufficient documentation

## 2021-01-24 DIAGNOSIS — M6281 Muscle weakness (generalized): Secondary | ICD-10-CM | POA: Insufficient documentation

## 2021-01-24 DIAGNOSIS — R293 Abnormal posture: Secondary | ICD-10-CM | POA: Insufficient documentation

## 2021-01-24 NOTE — Patient Instructions (Signed)
Access Code: [facility] URL: [email or web address] Date: 01/24/2021 Prepared by: Jari Favre  Exercises Supine Diaphragmatic Breathing - 3 x daily - 7 x weekly - 1 sets - 10 reps Supine Butterfly Groin Stretch - 1 x daily - 7 x weekly - 1 sets - 3 reps - 30 sec hold Supine Hamstring Stretch - 1 x daily - 7 x weekly - 1 sets - 3 reps - 30 sec hold Hooklying Single Knee to Chest Stretch - 1 x daily - 7 x weekly - 1 sets - 5 reps - 20 sec hold Supine Pelvic Floor Stretch - 1 x daily - 7 x weekly - 1 sets - 3 reps - 30 sec hold Standing Hamstring Stretch with Step - 1 x daily - 7 x weekly - 1 sets - 3 reps - 30 sec hold Standing Hip Flexor Stretch - 3 x daily - 7 x weekly - 1 sets - 2 reps - 30 sec hold

## 2021-01-24 NOTE — Therapy (Signed)
[facility] [address] [place], [place], [address] Phone: [phone]   Fax:  [phone]  Physical Therapy Treatment  Patient Details  Name: Maureen Barnes MRN: [id number] Date of Birth: September 10, 1960 Referring Provider (PT): Drema Dallas, DO   Encounter Date: 01/24/2021   PT End of Session - 01/24/21 1115     Visit Number 2    Date for PT Re-Evaluation 04/11/21    Authorization Type UHC    PT Start Time 1101    PT Stop Time 1141    PT Time Calculation (min) 40 min    Activity Tolerance Patient tolerated treatment well    Behavior During Therapy WFL for tasks assessed/performed             Past Medical History:  Diagnosis Date   Anemia    Cervical disc disease    Chronic kidney disease (CKD), stage V ([place])    Diabetes mellitus    Hep C w/o coma, chronic ([place])    Hypertension    Lumbar disc disease    Peritoneal dialysis status ([place])    Renal disorder     Past Surgical History:  Procedure Laterality Date   TUBAL LIGATION      There were no vitals filed for this visit.   Subjective Assessment - 01/24/21 1103     Subjective Pt states at [employer] went three times before and after checking out. Pt states after being up and walking dogs and will start to go to the bathroom from 9-10am and keeps going every 10-15 minutes.  Sometimes the stream is only 6 seconds.    Patient Stated Goals reduce leak and urge and pain    Currently in Pain? No/denies                               [facility] Adult PT Treatment/Exercise - 01/24/21 0001       Self-Care   Self-Care Other Self-Care Comments    Other Self-Care Comments  urge drills      Neuro Re-ed    Neuro Re-ed Details  diaphragmatic breathing      Exercises   Exercises Lumbar      Lumbar Exercises: Stretches   Active Hamstring Stretch Right;Left;2 reps;30 seconds    Single Knee to Chest Stretch Right;Left;3 reps;20 seconds     Hip Flexor Stretch Right;Left;1 rep;30 seconds    Figure 4 Stretch 3 reps;20 seconds                    PT Education - 01/24/21 1145     Education Details Access Code: [facility] and urge drills    Person(s) Educated Patient    Methods Explanation;Demonstration;Tactile cues;Verbal cues;Handout    Comprehension Verbalized understanding;Returned demonstration              PT Short Term Goals - 01/24/21 1126       PT SHORT TERM GOAL #1   Title Pt will be independent with initial HEP    Status On-going      PT SHORT TERM GOAL #2   Title ind with urge techniques    Baseline given today    Status On-going               PT Long Term Goals - 01/17/21 0958       PT LONG TERM GOAL #1   Title Pt will be able to  walk up and down stairs with 75% less pain    Time 12    Period Weeks    Status New    Target Date 04/11/21      PT LONG TERM GOAL #2   Title Pt will be ind with HEP to maintain strength    Time 12    Period Weeks    Status New    Target Date 04/11/21      PT LONG TERM GOAL #3   Title Pt will report at least 75% less leakage due to improved urgency    Time 12    Period Weeks    Status New    Target Date 04/11/21                   Plan - 01/24/21 1143     Clinical Impression Statement No goals met today as was the initial follow up visit.  Pt was given urge techniques and educated on diaphragmatic breathing an stretches.  Pt will need skilled PT to continue to progress towards functional goals.    PT Treatment/Interventions ADLs/Self Care Home Management;Biofeedback;Cryotherapy;Electrical Stimulation;Moist Heat;Neuromuscular re-education;Therapeutic exercise;Therapeutic activities;Patient/family education;Manual techniques;Passive range of motion;Dry needling;Taping    PT Next Visit Plan f/u on initial HEP and urge techniques; internal and external pelvic floor fascial release    Consulted and Agree with Plan of Care Patient              Patient will benefit from skilled therapeutic intervention in order to improve the following deficits and impairments:  Pain, Decreased strength, Postural dysfunction, Increased fascial restricitons, Impaired flexibility, Increased muscle spasms, Decreased coordination  Visit Diagnosis: Abnormal posture  Muscle weakness (generalized)  Unspecified lack of coordination     Problem List Patient Active Problem List   Diagnosis Date Noted   Anxiety 07/17/2018   ESRD on peritoneal dialysis ([place]) 07/17/2018   H/O cesarean section 07/17/2018   Evaluation by medical service required 04/02/2018   Pre-transplant evaluation for end stage renal disease 11/18/2017   Unilateral inguinal hernia 11/18/2017   Hyperlipidemia 01/22/2017   Palpitations 01/22/2017   Dyspnea on exertion 01/22/2017   CKD (chronic kidney disease) stage 3, GFR 30-59 ml/min (HCC) 10/18/2016   Non-compliance 10/18/2016   Chronic ethmoidal sinusitis 10/18/2016   Hepatitis C, chronic ([place]) 05/15/2012   Diabetes mellitus type 2, insulin dependent ([place]) 05/15/2012   Hypertension 05/15/2012   Anemia 05/15/2012    Camillo Flaming [name], PT 01/24/2021, 1:02 PM  [facility] [address], [place], [place], [address] Phone: [phone]   Fax:  [phone]  Name: Maureen Barnes MRN: [id number] Date of Birth: 01-Aug-1960

## 2021-01-30 ENCOUNTER — Other Ambulatory Visit: Payer: Self-pay

## 2021-01-30 ENCOUNTER — Encounter: Payer: Self-pay | Admitting: Physical Therapy

## 2021-01-30 ENCOUNTER — Ambulatory Visit: Payer: Medicare Other | Admitting: Physical Therapy

## 2021-01-30 DIAGNOSIS — R279 Unspecified lack of coordination: Secondary | ICD-10-CM

## 2021-01-30 DIAGNOSIS — R293 Abnormal posture: Secondary | ICD-10-CM

## 2021-01-30 DIAGNOSIS — M6281 Muscle weakness (generalized): Secondary | ICD-10-CM

## 2021-01-30 NOTE — Therapy (Signed)
[facility] [address] [place], [place], [address] Phone: [phone]   Fax:  [phone]  Physical Therapy Treatment  Patient Details  Name: Maureen Barnes MRN: [id number] Date of Birth: [birth year] Referring Provider (PT): Drema Dallas, DO   Encounter Date: 01/30/2021   PT End of Session - 01/30/21 1235     Visit Number 3    Date for PT Re-Evaluation 04/11/21    Authorization Type UHC    PT Start Time 1233    PT Stop Time 3212    PT Time Calculation (min) 39 min    Activity Tolerance Patient tolerated treatment well    Behavior During Therapy WFL for tasks assessed/performed             Past Medical History:  Diagnosis Date   Anemia    Cervical disc disease    Chronic kidney disease (CKD), stage V ([place])    Diabetes mellitus    Hep C w/o coma, chronic ([place])    Hypertension    Lumbar disc disease    Peritoneal dialysis status ([place])    Renal disorder     Past Surgical History:  Procedure Laterality Date   TUBAL LIGATION      There were no vitals filed for this visit.                      [facility] Adult PT Treatment/Exercise - 01/30/21 0001       Lumbar Exercises: Stretches   Other Lumbar Stretch Exercise hip rotation IR/ER educated and demo      Manual Therapy   Manual Therapy Myofascial release;Soft tissue mobilization    Soft tissue mobilization adductors bil; abdominal fascial release                    PT Education - 01/30/21 1310     Education Details Access Code: [facility]    Person(s) Educated Patient    Methods Explanation;Demonstration;Tactile cues;Verbal cues;Handout    Comprehension Verbalized understanding;Returned demonstration              PT Short Term Goals - 01/24/21 1126       PT SHORT TERM GOAL #1   Title Pt will be independent with initial HEP    Status On-going      PT SHORT TERM GOAL #2   Title ind with urge techniques     Baseline given today    Status On-going               PT Long Term Goals - 01/17/21 0958       PT LONG TERM GOAL #1   Title Pt will be able to walk up and down stairs with 75% less pain    Time 12    Period Weeks    Status New    Target Date 04/11/21      PT LONG TERM GOAL #2   Title Pt will be ind with HEP to maintain strength    Time 12    Period Weeks    Status New    Target Date 04/11/21      PT LONG TERM GOAL #3   Title Pt will report at least 75% less leakage due to improved urgency    Time 12    Period Weeks    Status New    Target Date 04/11/21  Plan - 01/30/21 1315     Clinical Impression Statement Pt did her exercises this past week.  Pt is ind with urge techniques.  Pt had fascial restrictions and did well with fascial release and improved soft tissue length of adductors today.  Pt did not want to do internal STM today but is will to try next visit.    PT Treatment/Interventions ADLs/Self Care Home Management;Biofeedback;Cryotherapy;Electrical Stimulation;Moist Heat;Neuromuscular re-education;Therapeutic exercise;Therapeutic activities;Patient/family education;Manual techniques;Passive range of motion;Dry needling;Taping    PT Next Visit Plan internal fascial release and/or lumbar STM, fu on stretches    PT Home Exercise Plan Access Code: [facility]    Consulted and Agree with Plan of Care Patient             Patient will benefit from skilled therapeutic intervention in order to improve the following deficits and impairments:  Pain, Decreased strength, Postural dysfunction, Increased fascial restricitons, Impaired flexibility, Increased muscle spasms, Decreased coordination  Visit Diagnosis: Abnormal posture  Muscle weakness (generalized)  Unspecified lack of coordination     Problem List Patient Active Problem List   Diagnosis Date Noted   Anxiety 07/17/2018   ESRD on peritoneal dialysis ([place]) 07/17/2018   H/O  cesarean section 07/17/2018   Evaluation by medical service required 04/02/2018   Pre-transplant evaluation for end stage renal disease 11/18/2017   Unilateral inguinal hernia 11/18/2017   Hyperlipidemia 01/22/2017   Palpitations 01/22/2017   Dyspnea on exertion 01/22/2017   CKD (chronic kidney disease) stage 3, GFR 30-59 ml/min (HCC) 10/18/2016   Non-compliance 10/18/2016   Chronic ethmoidal sinusitis 10/18/2016   Hepatitis C, chronic ([place]) 05/15/2012   Diabetes mellitus type 2, insulin dependent ([place]) 05/15/2012   Hypertension 05/15/2012   Anemia 05/15/2012    Camillo Flaming Emaleigh Guimond, PT 01/30/2021, 1:58 PM  [facility] [address] [place], [place], [address] Phone: [phone]   Fax:  [phone]  Name: KRISTEN FROMM MRN: [id number] Date of Birth: 06-08-1961

## 2021-01-30 NOTE — Patient Instructions (Signed)
Access Code: [facility] URL: [email or web address] Date: 01/30/2021 Prepared by: Jari Favre  Exercises Supine Diaphragmatic Breathing - 3 x daily - 7 x weekly - 1 sets - 10 reps Supine Butterfly Groin Stretch - 1 x daily - 7 x weekly - 1 sets - 3 reps - 30 sec hold Supine Hamstring Stretch - 1 x daily - 7 x weekly - 1 sets - 3 reps - 30 sec hold Hooklying Single Knee to Chest Stretch - 1 x daily - 7 x weekly - 1 sets - 5 reps - 20 sec hold Supine Pelvic Floor Stretch - 1 x daily - 7 x weekly - 1 sets - 3 reps - 30 sec hold Standing Hamstring Stretch with Step - 1 x daily - 7 x weekly - 1 sets - 3 reps - 30 sec hold Standing Hip Flexor Stretch - 3 x daily - 7 x weekly - 1 sets - 2 reps - 30 sec hold Supine Hip Internal and External Rotation - 1 x daily - 7 x weekly - 10 reps - 1 sets - 5 sec hold Supine Bilateral Hip Internal Rotation Stretch - 1 x daily - 7 x weekly - 3 reps - 1 sets - 30 sec hold

## 2021-02-09 ENCOUNTER — Other Ambulatory Visit: Payer: Self-pay

## 2021-02-09 ENCOUNTER — Encounter: Payer: Self-pay | Admitting: Physical Therapy

## 2021-02-09 ENCOUNTER — Ambulatory Visit: Payer: Medicare Other | Admitting: Physical Therapy

## 2021-02-09 DIAGNOSIS — R279 Unspecified lack of coordination: Secondary | ICD-10-CM

## 2021-02-09 DIAGNOSIS — R293 Abnormal posture: Secondary | ICD-10-CM | POA: Diagnosis not present

## 2021-02-09 DIAGNOSIS — M6281 Muscle weakness (generalized): Secondary | ICD-10-CM

## 2021-02-09 NOTE — Therapy (Signed)
[facility] [address] [place], [place], [address] Phone: [phone]   Fax:  [phone]  Physical Therapy Treatment  Patient Details  Name: Maureen Barnes MRN: [id number] Date of Birth: 01-12-1961 Referring Provider (PT): Drema Dallas, DO   Encounter Date: 02/09/2021   PT End of Session - 02/09/21 0801     Visit Number 4    Date for PT Re-Evaluation 04/11/21    Authorization Type UHC    PT Start Time 0802    PT Stop Time 0835    PT Time Calculation (min) 33 min    Activity Tolerance Patient tolerated treatment well    Behavior During Therapy [facility] for tasks assessed/performed             Past Medical History:  Diagnosis Date   Anemia    Cervical disc disease    Chronic kidney disease (CKD), stage V ([place])    Diabetes mellitus    Hep C w/o coma, chronic ([place])    Hypertension    Lumbar disc disease    Peritoneal dialysis status ([place])    Renal disorder     Past Surgical History:  Procedure Laterality Date   TUBAL LIGATION      There were no vitals filed for this visit.   Subjective Assessment - 02/09/21 0805     Subjective I didn't have coffee or tea last week and just water so I think there was less frequency to the bathroom    Pertinent History kidney transplant    Patient Stated Goals reduce leak and urge and pain                               [facility] Adult PT Treatment/Exercise - 02/09/21 0001       Self-Care   Other Self-Care Comments  educated on self stretch to vaginal canal and levators      Manual Therapy   Manual Therapy Myofascial release;Soft tissue mobilization;Internal Pelvic Floor    Manual therapy comments pt identity confirmed and internal soft tissue assessment and treatment performed    Internal Pelvic Floor levators and fascial release                      PT Short Term Goals - 02/09/21 1324       PT SHORT TERM GOAL #1    Title Pt will be independent with initial HEP    Status Achieved      PT SHORT TERM GOAL #2   Title ind with urge techniques    Status Achieved               PT Long Term Goals - 01/17/21 0958       PT LONG TERM GOAL #1   Title Pt will be able to walk up and down stairs with 75% less pain    Time 12    Period Weeks    Status New    Target Date 04/11/21      PT LONG TERM GOAL #2   Title Pt will be ind with HEP to maintain strength    Time 12    Period Weeks    Status New    Target Date 04/11/21      PT LONG TERM GOAL #3   Title Pt will report at least 75% less leakage due to improved urgency    Time 12  Period Weeks    Status New    Target Date 04/11/21                   Plan - 02/09/21 0809     Clinical Impression Statement Pt noticed feeling better due to drinking no caffeine and more water.  Pt has been going less frequently, but still more than usual and still having leakage.  Today's session focused on release of pelvic floor muscles with internal STM.  Pt tolerated well and was educated on trying at home.  May need to get more education for this next session again.  Pt will benefit from skilled PT to continue working on improved soft tissue length and strength.    PT Treatment/Interventions ADLs/Self Care Home Management;Biofeedback;Cryotherapy;Electrical Stimulation;Moist Heat;Neuromuscular re-education;Therapeutic exercise;Therapeutic activities;Patient/family education;Manual techniques;Passive range of motion;Dry needling;Taping    PT Next Visit Plan f/u on internal fascial release and how to do at home; basic kegel and core strength; lumbar release    PT Home Exercise Plan Access Code: [facility]    Consulted and Agree with Plan of Care Patient             Patient will benefit from skilled therapeutic intervention in order to improve the following deficits and impairments:  Pain, Decreased strength, Postural dysfunction, Increased fascial  restricitons, Impaired flexibility, Increased muscle spasms, Decreased coordination  Visit Diagnosis: Abnormal posture  Muscle weakness (generalized)  Unspecified lack of coordination     Problem List Patient Active Problem List   Diagnosis Date Noted   Anxiety 07/17/2018   ESRD on peritoneal dialysis ([place]) 07/17/2018   H/O cesarean section 07/17/2018   Evaluation by medical service required 04/02/2018   Pre-transplant evaluation for end stage renal disease 11/18/2017   Unilateral inguinal hernia 11/18/2017   Hyperlipidemia 01/22/2017   Palpitations 01/22/2017   Dyspnea on exertion 01/22/2017   CKD (chronic kidney disease) stage 3, GFR 30-59 ml/min (HCC) 10/18/2016   Non-compliance 10/18/2016   Chronic ethmoidal sinusitis 10/18/2016   Hepatitis C, chronic ([place]) 05/15/2012   Diabetes mellitus type 2, insulin dependent ([place]) 05/15/2012   Hypertension 05/15/2012   Anemia 05/15/2012    Camillo Flaming Maureen Barnes, PT 02/09/2021, 8:50 AM  [facility] [address] [place], [place], [address] Phone: [phone]   Fax:  [phone]  Name: Maureen Barnes MRN: [id number] Date of Birth: 17-Sep-1960

## 2021-02-13 ENCOUNTER — Other Ambulatory Visit: Payer: Self-pay

## 2021-02-13 ENCOUNTER — Encounter: Payer: Self-pay | Admitting: Physical Therapy

## 2021-02-13 ENCOUNTER — Ambulatory Visit: Payer: Medicare Other | Admitting: Physical Therapy

## 2021-02-13 DIAGNOSIS — R293 Abnormal posture: Secondary | ICD-10-CM

## 2021-02-13 DIAGNOSIS — R279 Unspecified lack of coordination: Secondary | ICD-10-CM

## 2021-02-13 DIAGNOSIS — M6281 Muscle weakness (generalized): Secondary | ICD-10-CM

## 2021-02-13 NOTE — Therapy (Signed)
[facility] [address], [place], [place], [address] Phone: [phone]   Fax:  [phone]  Physical Therapy Treatment  Patient Details  Name: Maureen Barnes MRN: [id number] Date of Birth: Aug 27, 1960 Referring Provider (PT): Drema Dallas, DO   Encounter Date: 02/13/2021   PT End of Session - 02/13/21 0935     Visit Number 5    Date for PT Re-Evaluation 04/11/21    Authorization Type UHC    PT Start Time 0848    PT Stop Time 0930    PT Time Calculation (min) 42 min    Activity Tolerance Patient tolerated treatment well    Behavior During Therapy [facility] for tasks assessed/performed             Past Medical History:  Diagnosis Date   Anemia    Cervical disc disease    Chronic kidney disease (CKD), stage V ([place])    Diabetes mellitus    Hep C w/o coma, chronic ([place])    Hypertension    Lumbar disc disease    Peritoneal dialysis status ([place])    Renal disorder     Past Surgical History:  Procedure Laterality Date   TUBAL LIGATION      There were no vitals filed for this visit.   Subjective Assessment - 02/13/21 0853     Subjective I was able to use the urge techniques.    Currently in Pain? No/denies                               [facility] Adult PT Treatment/Exercise - 02/13/21 0001       Self-Care   Other Self-Care Comments  educated on self stretch to vaginal canal and levators, review of this and urge techniques      Lumbar Exercises: Quadruped   [place] Limitations cat cow then into child pose 5x  with deep breathing    Other Quadruped Lumbar Exercises kegel in qped      Manual Therapy   Soft tissue mobilization lumbar erectors and fascial release                    PT Education - 02/13/21 1058     Education Details added cat cow, child pose, trunk rotation to [employer]) Educated Patient    Methods Explanation;Demonstration;Tactile  cues;Verbal cues;Handout    Comprehension Verbalized understanding;Returned demonstration              PT Short Term Goals - 02/09/21 1607       PT SHORT TERM GOAL #1   Title Pt will be independent with initial HEP    Status Achieved      PT SHORT TERM GOAL #2   Title ind with urge techniques    Status Achieved               PT Long Term Goals - 02/13/21 0855       PT LONG TERM GOAL #1   Title Pt will be able to walk up and down stairs with 75% less pain    Status On-going      PT LONG TERM GOAL #2   Title Pt will be ind with HEP to maintain strength    Status On-going      PT LONG TERM GOAL #3   Title Pt will report at least 75% less leakage due to improved  urgency    Baseline I have leakage in the panty liner sometimes and I don't feel it    Status On-going                   Plan - 02/13/21 0909     Clinical Impression Statement Today's session focused on toileting and urge technique education as well as educating how to do massage to perineum and levators.  Pt responded well to stretches today and felt good release and more mobility after lumbar STM.  Pt is still holding tension and needs to continue skilled PT to address muscle imbalances.    PT Treatment/Interventions ADLs/Self Care Home Management;Biofeedback;Cryotherapy;Electrical Stimulation;Moist Heat;Neuromuscular re-education;Therapeutic exercise;Therapeutic activities;Patient/family education;Manual techniques;Passive range of motion;Dry needling;Taping    PT Next Visit Plan f/u on internal fascial release and  lumbar release, do core and kegel strengthening as tolerated    PT Home Exercise Plan Access Code: [facility]             Patient will benefit from skilled therapeutic intervention in order to improve the following deficits and impairments:  Pain, Decreased strength, Postural dysfunction, Increased fascial restricitons, Impaired flexibility, Increased muscle spasms, Decreased  coordination  Visit Diagnosis: Abnormal posture  Muscle weakness (generalized)  Unspecified lack of coordination     Problem List Patient Active Problem List   Diagnosis Date Noted   Anxiety 07/17/2018   ESRD on peritoneal dialysis ([place]) 07/17/2018   H/O cesarean section 07/17/2018   Evaluation by medical service required 04/02/2018   Pre-transplant evaluation for end stage renal disease 11/18/2017   Unilateral inguinal hernia 11/18/2017   Hyperlipidemia 01/22/2017   Palpitations 01/22/2017   Dyspnea on exertion 01/22/2017   CKD (chronic kidney disease) stage 3, GFR 30-59 ml/min (HCC) 10/18/2016   Non-compliance 10/18/2016   Chronic ethmoidal sinusitis 10/18/2016   Hepatitis C, chronic ([place]) 05/15/2012   Diabetes mellitus type 2, insulin dependent ([place]) 05/15/2012   Hypertension 05/15/2012   Anemia 05/15/2012    Camillo Flaming Hartman Minahan, PT 02/13/2021, 11:57 AM   [facility] [address], [place], [place], [address] Phone: [phone]   Fax:  [phone]  Name: Maureen Barnes MRN: [id number] Date of Birth: [birth year]

## 2021-02-13 NOTE — Patient Instructions (Signed)
Double Voiding can be a very useful technique to help overcome incomplete emptying of your bladder.  Incomplete emptying of urine can result in leakage after using the bathroom and increase the risk of urinary tract infection.   Initial Void: When you first sit down to urinate, ensure optimal positioning for bladder emptying by following these guidelines for toileting posture: Sit on the toilet seat - don't hover over the seat Support your trunk by placing your hands on your knees or thighs Spread your knees and hips wide Position your feet flat on the floor or elevate feet on phone books, foot stool (Squatty Potty), or wrapped toilet paper rolls (if having knees above hips helps you empty) Lean forward from your hips Maintain the normal inward curve in your lower back   Repeated Void: After your initial void is complete, follow these movement patterns and attempt going to the bathroom again. Stand up Rotate your hips as if doing hula hoop in one direction Rotate using the same action in the other direction Rock your hips and pelvis back and forwards ("pelvic tilts") Rock your hips and pelvis side to side ("tail wag") Sit back down and repeat your voiding technique This technique can be repeated as many times as you choose to help you empty your bladder more effectively.

## 2021-02-21 ENCOUNTER — Encounter: Payer: Self-pay | Admitting: Physical Therapy

## 2021-02-21 ENCOUNTER — Ambulatory Visit: Payer: Medicare Other | Attending: Obstetrics and Gynecology | Admitting: Physical Therapy

## 2021-02-21 ENCOUNTER — Other Ambulatory Visit: Payer: Self-pay

## 2021-02-21 DIAGNOSIS — M6281 Muscle weakness (generalized): Secondary | ICD-10-CM | POA: Insufficient documentation

## 2021-02-21 DIAGNOSIS — R293 Abnormal posture: Secondary | ICD-10-CM | POA: Diagnosis present

## 2021-02-21 DIAGNOSIS — R279 Unspecified lack of coordination: Secondary | ICD-10-CM | POA: Insufficient documentation

## 2021-02-21 NOTE — Therapy (Signed)
[facility] [address], [place], [place], [address] Phone: [phone]   Fax:  [phone]  Physical Therapy Treatment  Patient Details  Name: Maureen Barnes MRN: [id number] Date of Birth: 07/30/1960 Referring Provider (PT): Drema Dallas, DO   Encounter Date: 02/21/2021   PT End of Session - 02/21/21 0952     Visit Number 6    Date for PT Re-Evaluation 04/11/21    Authorization Type UHC    PT Start Time 0935    PT Stop Time 1013    PT Time Calculation (min) 38 min    Activity Tolerance Patient tolerated treatment well    Behavior During Therapy [facility] for tasks assessed/performed             Past Medical History:  Diagnosis Date   Anemia    Cervical disc disease    Chronic kidney disease (CKD), stage V ([place])    Diabetes mellitus    Hep C w/o coma, chronic ([place])    Hypertension    Lumbar disc disease    Peritoneal dialysis status ([place])    Renal disorder     Past Surgical History:  Procedure Laterality Date   TUBAL LIGATION      There were no vitals filed for this visit.   Subjective Assessment - 02/21/21 0940     Subjective The bathroom is still strong and when I feel the urge a little I went to the store and started leaking.  I did the [employer]mv after that and then went home to the bathroom about an hour later.    Patient Stated Goals reduce leak and urge and pain    Currently in Pain? No/denies                               [facility] Adult PT Treatment/Exercise - 02/21/21 0001       Self-Care   Other Self-Care Comments  reviewed stretching vaginal canal and worked through barriers; urge techniques adding things to say to herself to calm down      Lumbar Exercises: Supine   Ab Set 10 reps    AB Set Limitations with kegel    Other Supine Lumbar Exercises clam and kegel with 5 sec hold and 5 sec relax TC and VC to perform correctly 20x each                       PT Short Term Goals - 02/09/21 3536       PT SHORT TERM GOAL #1   Title Pt will be independent with initial HEP    Status Achieved      PT SHORT TERM GOAL #2   Title ind with urge techniques    Status Achieved               PT Long Term Goals - 02/13/21 0855       PT LONG TERM GOAL #1   Title Pt will be able to walk up and down stairs with 75% less pain    Status On-going      PT LONG TERM GOAL #2   Title Pt will be ind with HEP to maintain strength    Status On-going      PT LONG TERM GOAL #3   Title Pt will report at least 75% less leakage due to improved urgency    Baseline I have  leakage in the panty liner sometimes and I don't feel it    Status On-going                   Plan - 02/21/21 1144     Clinical Impression Statement Pt has improved overall, but continues to have extreme urgency which seems to be related in part still to anxiety or stress when thinking about getting things done.  She was given some different relaxing techniques to say " I have plenty of time", I have gotten a lot done".  Pt did well with addition of strength today.  Continue to work towards [place].    PT Treatment/Interventions ADLs/Self Care Home Management;Biofeedback;Cryotherapy;Electrical Stimulation;Moist Heat;Neuromuscular re-education;Therapeutic exercise;Therapeutic activities;Patient/family education;Manual techniques;Passive range of motion;Dry needling;Taping    PT Next Visit Plan f/u on saying things when going to bathroom, HEP, do biofeedback today    PT Home Exercise Plan Access Code: [facility]    Consulted and Agree with Plan of Care Patient             Patient will benefit from skilled therapeutic intervention in order to improve the following deficits and impairments:  Pain, Decreased strength, Postural dysfunction, Increased fascial restricitons, Impaired flexibility, Increased muscle spasms, Decreased coordination  Visit  Diagnosis: Abnormal posture  Muscle weakness (generalized)  Unspecified lack of coordination     Problem List Patient Active Problem List   Diagnosis Date Noted   Anxiety 07/17/2018   ESRD on peritoneal dialysis ([place]) 07/17/2018   H/O cesarean section 07/17/2018   Evaluation by medical service required 04/02/2018   Pre-transplant evaluation for end stage renal disease 11/18/2017   Unilateral inguinal hernia 11/18/2017   Hyperlipidemia 01/22/2017   Palpitations 01/22/2017   Dyspnea on exertion 01/22/2017   CKD (chronic kidney disease) stage 3, GFR 30-59 ml/min (HCC) 10/18/2016   Non-compliance 10/18/2016   Chronic ethmoidal sinusitis 10/18/2016   Hepatitis C, chronic ([place]) 05/15/2012   Diabetes mellitus type 2, insulin dependent ([place]) 05/15/2012   Hypertension 05/15/2012   Anemia 05/15/2012    Maureen Barnes, PT 02/21/2021, 11:49 AM  [facility] [address] [place], [place], [address] Phone: [phone]   Fax:  [phone]  Name: Maureen Barnes MRN: [id number] Date of Birth: 1961-05-16

## 2021-02-27 ENCOUNTER — Ambulatory Visit: Payer: Medicare Other | Admitting: Physical Therapy

## 2021-02-27 ENCOUNTER — Other Ambulatory Visit: Payer: Self-pay

## 2021-02-27 ENCOUNTER — Encounter: Payer: Self-pay | Admitting: Physical Therapy

## 2021-02-27 DIAGNOSIS — M6281 Muscle weakness (generalized): Secondary | ICD-10-CM

## 2021-02-27 DIAGNOSIS — R293 Abnormal posture: Secondary | ICD-10-CM

## 2021-02-27 DIAGNOSIS — R279 Unspecified lack of coordination: Secondary | ICD-10-CM

## 2021-02-27 NOTE — Patient Instructions (Signed)
Access Code: [facility] URL: [email or web address] Date: 02/27/2021 Prepared by: Jari Favre  Exercises Supine Diaphragmatic Breathing - 3 x daily - 7 x weekly - 1 sets - 10 reps Supine Butterfly Groin Stretch - 1 x daily - 7 x weekly - 1 sets - 3 reps - 30 sec hold Supine Hamstring Stretch - 1 x daily - 7 x weekly - 1 sets - 3 reps - 30 sec hold Hooklying Single Knee to Chest Stretch - 1 x daily - 7 x weekly - 1 sets - 5 reps - 20 sec hold Supine Pelvic Floor Stretch - 1 x daily - 7 x weekly - 1 sets - 3 reps - 30 sec hold Standing Hamstring Stretch with Step - 1 x daily - 7 x weekly - 1 sets - 3 reps - 30 sec hold Standing Hip Flexor Stretch - 3 x daily - 7 x weekly - 1 sets - 2 reps - 30 sec hold Supine Hip Internal and External Rotation - 1 x daily - 7 x weekly - 10 reps - 1 sets - 5 sec hold Supine Bilateral Hip Internal Rotation Stretch - 1 x daily - 7 x weekly - 3 reps - 1 sets - 30 sec hold Cat Cow to Child's Pose - 1 x daily - 7 x weekly - 5 reps - 1 sets - 10 sec hold Supine Lower Trunk Rotation - 1 x daily - 7 x weekly - 10 reps - 1 sets - 5 sec hold Hooklying Clamshells with Resistance - 1 x daily - 7 x weekly - 3 sets - 10 reps - 5 sec hold Supine Pelvic Floor Contraction - 3 x daily - 7 x weekly - 1 sets - 10 reps - 5 sec hold Dead Bug Alternating Arm Extension - 1 x daily - 7 x weekly - 3 sets - 10 reps

## 2021-02-27 NOTE — Therapy (Signed)
[facility] [address] [place], [place], [address] Phone: [phone]   Fax:  [phone]  Physical Therapy Treatment  Patient Details  Name: Maureen Barnes MRN: [id number] Date of Birth: 1960/09/18 Referring Provider (PT): Drema Dallas, DO   Encounter Date: 02/27/2021   PT End of Session - 02/27/21 1209     Visit Number 7    Date for PT Re-Evaluation 04/11/21    Authorization Type UHC    PT Start Time 1103    PT Stop Time 1148    PT Time Calculation (min) 45 min    Activity Tolerance Patient tolerated treatment well    Behavior During Therapy [facility] for tasks assessed/performed             Past Medical History:  Diagnosis Date   Anemia    Cervical disc disease    Chronic kidney disease (CKD), stage V ([place])    Diabetes mellitus    Hep C w/o coma, chronic ([place])    Hypertension    Lumbar disc disease    Peritoneal dialysis status ([place])    Renal disorder     Past Surgical History:  Procedure Laterality Date   TUBAL LIGATION      There were no vitals filed for this visit.   Subjective Assessment - 02/27/21 1106     Subjective Saturday I was out for the day and had a few sips of coffee.  I had a few drips when I got into the stall.    Patient Stated Goals reduce leak and urge and pain    Currently in Pain? No/denies                               [facility] Adult PT Treatment/Exercise - 02/27/21 0001       Neuro Re-ed    Neuro Re-ed Details  biofeedback, supine, sitting, standing - cues for TrA activation      Lumbar Exercises: Standing   Other Standing Lumbar Exercises staggered stance kegel      Lumbar Exercises: Seated   Other Seated Lumbar Exercises kegel and try to relax in sitting      Lumbar Exercises: Supine   Ab Set 10 reps    AB Set Limitations with kegel and alt UE; ball overhead, clam red band - cues to breathe                    PT Education  - 02/27/21 1211     Education Details Access Code: [facility]    Person(s) Educated Patient    Methods Explanation;Demonstration;Handout;Verbal cues;Tactile cues    Comprehension Verbalized understanding              PT Short Term Goals - 02/09/21 0254       PT SHORT TERM GOAL #1   Title Pt will be independent with initial HEP    Status Achieved      PT SHORT TERM GOAL #2   Title ind with urge techniques    Status Achieved               PT Long Term Goals - 02/27/21 1207       PT LONG TERM GOAL #1   Title Pt will be able to walk up and down stairs with 75% less pain    Status On-going      PT LONG TERM GOAL #2  Title Pt will be ind with HEP to maintain strength    Status On-going      PT LONG TERM GOAL #3   Title Pt will report at least 75% less leakage due to improved urgency    Baseline it only happened a little one time last week    Status On-going                   Plan - 02/27/21 1158     Clinical Impression Statement Today's session focused on pelvic floor muscle training using biofeedback.  Pt did well in supine with more difficulty in standing.  She was fatigued by the end of today's sesion and contracting pelvic floor became more challenging. Pt was wearing a compression garment and she was educated on trying to go without this due to the stress put on the bladder.  Pt will benefit from skilled PT for 2 more session likely needed for finalizing the HEP    PT Treatment/Interventions ADLs/Self Care Home Management;Biofeedback;Cryotherapy;Electrical Stimulation;Moist Heat;Neuromuscular re-education;Therapeutic exercise;Therapeutic activities;Patient/family education;Manual techniques;Passive range of motion;Dry needling;Taping    PT Next Visit Plan f/u on not using the compression garment, progress core ex's with cues for exhale on exertion    PT Home Exercise Plan Access Code: [facility]    Consulted and Agree with Plan of Care Patient              Patient will benefit from skilled therapeutic intervention in order to improve the following deficits and impairments:  Pain, Decreased strength, Postural dysfunction, Increased fascial restricitons, Impaired flexibility, Increased muscle spasms, Decreased coordination  Visit Diagnosis: Abnormal posture  Muscle weakness (generalized)  Unspecified lack of coordination     Problem List Patient Active Problem List   Diagnosis Date Noted   Anxiety 07/17/2018   ESRD on peritoneal dialysis ([place]) 07/17/2018   H/O cesarean section 07/17/2018   Evaluation by medical service required 04/02/2018   Pre-transplant evaluation for end stage renal disease 11/18/2017   Unilateral inguinal hernia 11/18/2017   Hyperlipidemia 01/22/2017   Palpitations 01/22/2017   Dyspnea on exertion 01/22/2017   CKD (chronic kidney disease) stage 3, GFR 30-59 ml/min (HCC) 10/18/2016   Non-compliance 10/18/2016   Chronic ethmoidal sinusitis 10/18/2016   Hepatitis C, chronic ([place]) 05/15/2012   Diabetes mellitus type 2, insulin dependent ([place]) 05/15/2012   Hypertension 05/15/2012   Anemia 05/15/2012    Camillo Flaming Roslyn Else, PT 02/27/2021, 12:21 PM  [facility] [address] [place], [place], [address] Phone: [phone]   Fax:  [phone]  Name: ELVIA AYDIN MRN: [id number] Date of Birth: 1960/08/10

## 2021-02-28 ENCOUNTER — Other Ambulatory Visit: Payer: Self-pay | Admitting: Nurse Practitioner

## 2021-02-28 DIAGNOSIS — N644 Mastodynia: Secondary | ICD-10-CM

## 2021-03-03 ENCOUNTER — Other Ambulatory Visit: Payer: Self-pay

## 2021-03-03 ENCOUNTER — Ambulatory Visit (INDEPENDENT_AMBULATORY_CARE_PROVIDER_SITE_OTHER): Payer: Medicare Other | Admitting: Cardiovascular Disease

## 2021-03-03 ENCOUNTER — Encounter: Payer: Self-pay | Admitting: Cardiovascular Disease

## 2021-03-03 VITALS — BP 131/77 | HR 84 | Ht 63.0 in | Wt 176.6 lb

## 2021-03-03 DIAGNOSIS — I10 Essential (primary) hypertension: Secondary | ICD-10-CM

## 2021-03-03 DIAGNOSIS — E785 Hyperlipidemia, unspecified: Secondary | ICD-10-CM

## 2021-03-03 DIAGNOSIS — R002 Palpitations: Secondary | ICD-10-CM | POA: Diagnosis not present

## 2021-03-03 DIAGNOSIS — E782 Mixed hyperlipidemia: Secondary | ICD-10-CM | POA: Diagnosis not present

## 2021-03-03 NOTE — Progress Notes (Signed)
03/03/2021 Maureen Barnes   [birth year]  [id number]  Primary Physician Berkley Harvey, NP Primary Cardiologist: Lorretta Harp MD Garret Reddish, [place], [place]  HPI:  Maureen Barnes is a 60 y.o.   moderately overweight single African-American female mother of 3 children referred by Dr. Lysle Rubens  for cardiovascular evaluation because of symptomatic palpitations.  I last saw her in the office 12/04/2017.  She has a history of true hypertension, hyperlipidemia and diabetes. She has never smoked. She currently does not work but previously worked as a Cytogeneticist. She's had chronic renal insufficiency for years with anticipation of needing an AV fistula in the near future. She does complain of dyspnea on exertion and some atypical chest pain as well as symptomatic palpitations. She has chronic hepatitis C   Since I saw her in the office 3 years ago she continues to do well.  She did get a renal transplant at [facility] in 2020.  She saw Almyra Deforest PA-C in the office for palpitations.  An event monitor showed PVCs.  2D echo has been normal in the past.  She has cut out caffeine from her diet.  Toprol-XL 25 mg was added to her medical regimen in the evening which is resulted in some improvement.   Current Meds  Medication Sig   amLODipine (NORVASC) 5 MG tablet Take 5 mg by mouth daily.   aspirin EC 81 MG tablet Take 81 mg by mouth daily.   atorvastatin (LIPITOR) 20 MG tablet Take 1 tablet by mouth at bedtime.   b complex-vitamin c-folic acid (NEPHRO-VITE) 0.8 MG TABS tablet Take 1 tablet by mouth daily.   ciclopirox (PENLAC) 8 % solution Apply topically at bedtime. Apply over nail and surrounding skin. Apply daily over previous coat. After seven (7) days, may remove with alcohol and continue cycle.   cinacalcet (SENSIPAR) 30 MG tablet TAKE 1 TABLET BY MOUTH EVERY OTHER DAY THREE DAYS A WEEK (3 TIMES A WEEK)   Continuous Blood Gluc Receiver (DEXCOM G6 RECEIVER) DEVI  Use as directed for continuous glucose monitoring.   Continuous Blood Gluc Sensor (DEXCOM G6 SENSOR) MISC Inject 1 sensor to the skin every 10 days for continuous glucose monitoring.   Continuous Blood Gluc Transmit (DEXCOM G6 TRANSMITTER) MISC Use as directed for continuous glucose monitoring. Reuse transmitter for 90 days then discard and replace.   diphenhydrAMINE (BENADRYL) 25 MG tablet Take 1 tablet (25 mg total) by mouth at bedtime as needed.   docusate sodium (COLACE) 100 MG capsule Take 100 mg by mouth 2 (two) times daily.   ENVARSUS XR 1 MG TB24 Take 1 tablet by mouth daily as needed.   ENVARSUS XR 4 MG TB24 Take 2 tablets by mouth daily.   famotidine (PEPCID) 10 MG tablet Take 10 mg by mouth 2 (two) times daily.   fluticasone (FLONASE) 50 MCG/ACT nasal spray Place 2 sprays into both nostrils daily.   HUMALOG 100 UNIT/ML injection 100 Units.   insulin glargine (LANTUS) 100 UNIT/ML injection Inject 20 Units into the skin daily. As Needed   loratadine (CLARITIN) 10 MG tablet Take 10 mg by mouth as directed.   metoprolol succinate (TOPROL-XL) 50 MG 24 hr tablet Take by mouth 50 mg in the mornings and 25 mg (half tablet) in the evenings   mycophenolate (MYFORTIC) 180 MG EC tablet Take 180 mg by mouth in the morning, at noon, and at bedtime.   omeprazole (PRILOSEC) 40 MG capsule  Take 20 mg by mouth daily. Takes 20 MG daily   ondansetron (ZOFRAN) 4 MG tablet Take 1 tablet (4 mg total) by mouth every 8 (eight) hours as needed for nausea or vomiting.   predniSONE (DELTASONE) 5 MG tablet Take 5 mg by mouth daily.   Probiotic Product (PROBIOTIC PEARLS ADVANTAGE PO) Take by mouth.   simethicone (MYLICON) 80 MG chewable tablet Chew 80 mg by mouth every 6 (six) hours as needed for flatulence.   sulfamethoxazole-trimethoprim (BACTRIM DS) 800-160 MG tablet 1 tablet   tiZANidine (ZANAFLEX) 2 MG tablet Take by mouth.     Allergies  Allergen Reactions   Peanut Oil Anaphylaxis, Itching and Swelling     Tree nuts   Shellfish-Derived Products     Lips and thraot swell   Iodine Itching and Swelling    Shellfish-lips swell itching.   Peanut-Containing Drug Products Itching and Swelling   Shellfish Allergy Itching and Swelling    Social History   Socioeconomic History   Marital status: Single    Spouse name: Not on file   Number of children: Not on file   Years of education: Not on file   Highest education level: Not on file  Occupational History   Not on file  Tobacco Use   Smoking status: Never   Smokeless tobacco: Never  Substance and Sexual Activity   Alcohol use: No   Drug use: No   Sexual activity: Not Currently    Birth control/protection: Surgical  Other Topics Concern   Not on file  Social History Narrative   Not on file   Social Determinants of Health   Financial Resource Strain: Not on file  Food Insecurity: Not on file  Transportation Needs: Not on file  Physical Activity: Not on file  Stress: Not on file  Social Connections: Not on file  Intimate Partner Violence: Not on file     Review of Systems: General: negative for chills, fever, night sweats or weight changes.  Cardiovascular: negative for chest pain, dyspnea on exertion, edema, orthopnea, palpitations, paroxysmal nocturnal dyspnea or shortness of breath Dermatological: negative for rash Respiratory: negative for cough or wheezing Urologic: negative for hematuria Abdominal: negative for nausea, vomiting, diarrhea, bright red blood per rectum, melena, or hematemesis Neurologic: negative for visual changes, syncope, or dizziness All other systems reviewed and are otherwise negative except as noted above.    Blood pressure 131/77, pulse 84, height 5\' 3"  (1.6 m), weight 176 lb 9.6 oz (80.1 kg), last menstrual period 07/10/2011, SpO2 98 %.  General appearance: alert and no distress Neck: no adenopathy, no carotid bruit, no JVD, supple, symmetrical, trachea midline, and thyroid not enlarged,  symmetric, no tenderness/mass/nodules Lungs: clear to auscultation bilaterally Heart: regular rate and rhythm, S1, S2 normal, no murmur, click, rub or gallop Extremities: extremities normal, atraumatic, no cyanosis or edema Pulses: 2+ and symmetric Skin: Skin color, texture, turgor normal. No rashes or lesions Neurologic: Grossly normal  EKG not performed today  ASSESSMENT AND PLAN:   Hypertension History of essential hypertension a blood pressure measured today 131/77.  She is on amlodipine and Toprol.  Hyperlipidemia History of hyperlipidemia on atorvastatin 20 mg a day with lipid profile performed 09/22/2018 revealing total cholesterol 266, LDL 116 HDL 39.  She says that she does not feel good on higher doses.  I am referring her to Dr. Debara Pickett for further A. fib valuation and treatment with potentially a PCSK9.  Palpitations History of palpitations with PVCs on event monitoring.  Almyra Deforest PA-C added an additional Toprol 25 mg at night which has somewhat improved her symptoms.  She is cut out caffeine.  She is willing to add an additional 25 mg of Toprol in the morning to see if this improves her further.     Lorretta Harp MD FACP,FACC,FAHA, [facility] 03/03/2021 11:14 AM

## 2021-03-03 NOTE — Assessment & Plan Note (Signed)
History of essential hypertension a blood pressure measured today 131/77.  She is on amlodipine and Toprol.

## 2021-03-03 NOTE — Assessment & Plan Note (Signed)
History of palpitations with PVCs on event monitoring.  Almyra Deforest PA-C added an additional Toprol 25 mg at night which has somewhat improved her symptoms.  She is cut out caffeine.  She is willing to add an additional 25 mg of Toprol in the morning to see if this improves her further.

## 2021-03-03 NOTE — Assessment & Plan Note (Signed)
History of hyperlipidemia on atorvastatin 20 mg a day with lipid profile performed 09/22/2018 revealing total cholesterol 266, LDL 116 HDL 39.  She says that she does not feel good on higher doses.  I am referring her to Dr. Debara Pickett for further A. fib valuation and treatment with potentially a PCSK9.

## 2021-03-03 NOTE — Patient Instructions (Signed)
Medication Instructions:  Your physician recommends that you continue on your current medications as directed. Please refer to the Current Medication list given to you today.  *If you need a refill on your cardiac medications before your next appointment, please call your pharmacy*  Follow-Up: At [facility], you and your health needs are our priority.  As part of our continuing mission to provide you with exceptional heart care, we have created designated Provider Care Teams.  These Care Teams include your primary Cardiologist (physician) and Advanced Practice Providers (APPs -  Physician Assistants and Nurse Practitioners) who all work together to provide you with the care you need, when you need it.  We recommend signing up for the patient portal called "MyChart".  Sign up information is provided on this After Visit Summary.  MyChart is used to connect with patients for Virtual Visits (Telemedicine).  Patients are able to view lab/test results, encounter notes, upcoming appointments, etc.  Non-urgent messages can be sent to your provider as well.   To learn more about what you can do with MyChart, go to [email or web address].    Your next appointment:    Almyra Deforest PA in 6 months Dr. Gwenlyn Found in 12 months  You have been referred to Dr. Wonda Horner clinic

## 2021-03-10 ENCOUNTER — Other Ambulatory Visit: Payer: Self-pay

## 2021-03-10 ENCOUNTER — Encounter: Payer: Self-pay | Admitting: Physical Therapy

## 2021-03-10 ENCOUNTER — Ambulatory Visit: Payer: Medicare Other | Admitting: Physical Therapy

## 2021-03-10 DIAGNOSIS — R279 Unspecified lack of coordination: Secondary | ICD-10-CM

## 2021-03-10 DIAGNOSIS — R293 Abnormal posture: Secondary | ICD-10-CM

## 2021-03-10 DIAGNOSIS — M6281 Muscle weakness (generalized): Secondary | ICD-10-CM

## 2021-03-10 NOTE — Therapy (Signed)
[facility] [address] [place], [place], [address] Phone: [phone]   Fax:  [phone]  Physical Therapy Treatment  Patient Details  Name: Maureen Barnes MRN: [id number] Date of Birth: 1960/10/01 Referring Provider (PT): Drema Dallas, DO   Encounter Date: 03/10/2021   PT End of Session - 03/10/21 0952     Visit Number 8    Date for PT Re-Evaluation 04/11/21    Authorization Type UHC    PT Start Time 0935    PT Stop Time 1013    PT Time Calculation (min) 38 min    Activity Tolerance Patient tolerated treatment well    Behavior During Therapy WFL for tasks assessed/performed             Past Medical History:  Diagnosis Date   Anemia    Cervical disc disease    Chronic kidney disease (CKD), stage V ([place])    Diabetes mellitus    Hep C w/o coma, chronic ([place])    Hypertension    Lumbar disc disease    Peritoneal dialysis status ([place])    Renal disorder     Past Surgical History:  Procedure Laterality Date   TUBAL LIGATION      There were no vitals filed for this visit.   Subjective Assessment - 03/10/21 0939     Subjective I am going to the bathroom and noticed that the urine comes out with the BM like everything is relaxed    Pertinent History kidney transplant    Limitations Standing    Patient Stated Goals reduce leak and urge and pain                               [facility] Adult PT Treatment/Exercise - 03/10/21 0001       Neuro Re-ed    Neuro Re-ed Details  cues for breathing and keep knees from hyperextending      Lumbar Exercises: Standing   Wall Slides 20 reps   10x with ball squeeze; 10 x with pelvic tilt   Shoulder Extension Strengthening;Both;20 reps;Theraband    Theraband Level (Shoulder Extension) Level 2 (Red)    Shoulder Extension Limitations isometric reactive shoulder flexion - 20x    Other Standing Lumbar Exercises one foot on ball with UE  reaching; single leg stand with hip abduction 10x each                      PT Short Term Goals - 02/09/21 5638       PT SHORT TERM GOAL #1   Title Pt will be independent with initial HEP    Status Achieved      PT SHORT TERM GOAL #2   Title ind with urge techniques    Status Achieved               PT Long Term Goals - 03/10/21 1008       PT LONG TERM GOAL #1   Title Pt will be able to walk up and down stairs with 75% less pain    Baseline pt repots 97-98% better    Status Achieved      PT LONG TERM GOAL #2   Title Pt will be ind with HEP to maintain strength    Status On-going      PT LONG TERM GOAL #3   Title Pt will report at least 75%  less leakage due to improved urgency    Baseline when it happens it comes out of nowhere    Status On-going                   Plan - 03/10/21 1006     Clinical Impression Statement Pt did well with progression of core exercises today.  Overall she is feeling 98% less pain. Pt is still having leakage that occurs when she isn't getting the urge.  At this time, skilled PT is working on overflow strengthening to see if activation of the larger muscle groups will help with her overall sensation, body awareness, and strength.  She needs cues throughout to activate her core and prevent hyperextension of bil knees which significantly improves her posture.    PT Treatment/Interventions ADLs/Self Care Home Management;Biofeedback;Cryotherapy;Electrical Stimulation;Moist Heat;Neuromuscular re-education;Therapeutic exercise;Therapeutic activities;Patient/family education;Manual techniques;Passive range of motion;Dry needling;Taping    PT Next Visit Plan re-eval for d/c likely; f/u on consistently doing the strengthening ex's, final HEP with focus on core strength    PT Home Exercise Plan Access Code: [facility]    Consulted and Agree with Plan of Care Patient             Patient will benefit from skilled therapeutic  intervention in order to improve the following deficits and impairments:  Pain, Decreased strength, Postural dysfunction, Increased fascial restricitons, Impaired flexibility, Increased muscle spasms, Decreased coordination  Visit Diagnosis: Abnormal posture  Muscle weakness (generalized)  Unspecified lack of coordination     Problem List Patient Active Problem List   Diagnosis Date Noted   Anxiety 07/17/2018   ESRD on peritoneal dialysis ([place]) 07/17/2018   H/O cesarean section 07/17/2018   Evaluation by medical service required 04/02/2018   Pre-transplant evaluation for end stage renal disease 11/18/2017   Unilateral inguinal hernia 11/18/2017   Hyperlipidemia 01/22/2017   Palpitations 01/22/2017   Dyspnea on exertion 01/22/2017   CKD (chronic kidney disease) stage 3, GFR 30-59 ml/min (HCC) 10/18/2016   Non-compliance 10/18/2016   Chronic ethmoidal sinusitis 10/18/2016   Hepatitis C, chronic ([place]) 05/15/2012   Diabetes mellitus type 2, 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 [place], [place], [address] Phone: [phone]   Fax:  [phone]  Name: Maureen Barnes MRN: [id number] Date of Birth: 1960-12-27

## 2021-03-14 ENCOUNTER — Other Ambulatory Visit: Payer: Self-pay

## 2021-03-14 ENCOUNTER — Ambulatory Visit: Payer: Medicare Other | Admitting: Physical Therapy

## 2021-03-14 ENCOUNTER — Encounter: Payer: Self-pay | Admitting: Physical Therapy

## 2021-03-14 DIAGNOSIS — R279 Unspecified lack of coordination: Secondary | ICD-10-CM

## 2021-03-14 DIAGNOSIS — M6281 Muscle weakness (generalized): Secondary | ICD-10-CM

## 2021-03-14 DIAGNOSIS — R293 Abnormal posture: Secondary | ICD-10-CM

## 2021-03-14 NOTE — Therapy (Signed)
[facility] [address], [place], [place], [address] Phone: [phone]   Fax:  [phone]  Physical Therapy Treatment  Patient Details  Name: Maureen Barnes MRN: [id number] Date of Birth: 1960/08/11 Referring Provider (PT): Drema Dallas, DO   Encounter Date: 03/14/2021   PT End of Session - 03/14/21 0944     Visit Number 9    Date for PT Re-Evaluation 04/11/21    Authorization Type UHC    PT Start Time [phone]   late   PT Stop Time 1013    PT Time Calculation (min) 35 min    Activity Tolerance Patient tolerated treatment well    Behavior During Therapy WFL for tasks assessed/performed             Past Medical History:  Diagnosis Date   Anemia    Cervical disc disease    Chronic kidney disease (CKD), stage V ([place])    Diabetes mellitus    Hep C w/o coma, chronic ([place])    Hypertension    Lumbar disc disease    Peritoneal dialysis status ([place])    Renal disorder     Past Surgical History:  Procedure Laterality Date   TUBAL LIGATION      There were no vitals filed for this visit.   Subjective Assessment - 03/14/21 0943     Subjective Pt states she is doing well with exercises and feels like overall much better.    Patient Stated Goals reduce leak and urge and pain    Currently in Pain? No/denies                               [facility] Adult PT Treatment/Exercise - 03/14/21 0001       Lumbar Exercises: Aerobic   Nustep L1 x 4 min; L3 x 2 min PT present for status update      Lumbar Exercises: Machines for Strengthening   Leg Press 60 lb bil 20x   single leg 30 # 15x each     Lumbar Exercises: Standing   Functional Squats 20 reps    Functional Squats Limitations holding yellow band    Other Standing Lumbar Exercises pallof series yellow band, half kneel diagonal with yellow band    Other Standing Lumbar Exercises bird dips 10x each side 1 UE hold                       PT Short Term Goals - 02/09/21 0100       PT SHORT TERM GOAL #1   Title Pt will be independent with initial HEP    Status Achieved      PT SHORT TERM GOAL #2   Title ind with urge techniques    Status Achieved               PT Long Term Goals - 03/14/21 1012       PT LONG TERM GOAL #1   Title Pt will be able to walk up and down stairs with 75% less pain    Baseline pt repots 97-98% better    Status Achieved      PT LONG TERM GOAL #2   Title Pt will be ind with HEP to maintain strength    Status Achieved      PT LONG TERM GOAL #3   Title Pt will report at least 75% less leakage  due to improved urgency    Baseline some but less than 25%    Status Not Met                   Plan - 03/14/21 1015     Clinical Impression Statement Pt did well with final HEP.  Still having leakge but feels it ismuch better as stated in goals and feels that she can get farther to the bathroom.  Pt will d/c from [place] today    PT Treatment/Interventions ADLs/Self Care Home Management;Biofeedback;Cryotherapy;Electrical Stimulation;Moist Heat;Neuromuscular re-education;Therapeutic exercise;Therapeutic activities;Patient/family education;Manual techniques;Passive range of motion;Dry needling;Taping    PT Next Visit Plan d/c today    PT Home Exercise Plan Access Code: [facility]    Consulted and Agree with Plan of Care Patient             Patient will benefit from skilled therapeutic intervention in order to improve the following deficits and impairments:  Pain, Decreased strength, Postural dysfunction, Increased fascial restricitons, Impaired flexibility, Increased muscle spasms, Decreased coordination  Visit Diagnosis: Abnormal posture  Muscle weakness (generalized)  Unspecified lack of coordination     Problem List Patient Active Problem List   Diagnosis Date Noted   Anxiety 07/17/2018   ESRD on peritoneal dialysis ([place]) 07/17/2018   H/O  cesarean section 07/17/2018   Evaluation by medical service required 04/02/2018   Pre-transplant evaluation for end stage renal disease 11/18/2017   Unilateral inguinal hernia 11/18/2017   Hyperlipidemia 01/22/2017   Palpitations 01/22/2017   Dyspnea on exertion 01/22/2017   CKD (chronic kidney disease) stage 3, GFR 30-59 ml/min (HCC) 10/18/2016   Non-compliance 10/18/2016   Chronic ethmoidal sinusitis 10/18/2016   Hepatitis C, chronic ([place]) 05/15/2012   Diabetes mellitus type 2, 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 [place], [place], [address] Phone: [phone]   Fax:  [phone]  Name: Maureen Barnes MRN: [id number] Date of Birth: [birth year]   PHYSICAL THERAPY DISCHARGE SUMMARY  Visits from Start of Care: 9  Current functional level related to goals / functional outcomes: See above goals   Remaining deficits: See above    Education / Equipment: HEP   Patient agrees to discharge. Patient goals were partially met. Patient is being discharged due to being pleased with the current functional level.  [name], PT 03/14/21 10:17 AM

## 2021-04-17 ENCOUNTER — Ambulatory Visit: Payer: Medicare Other

## 2021-04-17 ENCOUNTER — Ambulatory Visit
Admission: RE | Admit: 2021-04-17 | Discharge: 2021-04-17 | Disposition: A | Discharge status: Home / Self Care | Payer: Medicare Other | Source: Ambulatory Visit | Attending: Nurse Practitioner | Admitting: Nurse Practitioner

## 2021-04-17 ENCOUNTER — Encounter (HOSPITAL_COMMUNITY): Payer: Self-pay

## 2021-04-17 ENCOUNTER — Other Ambulatory Visit: Payer: Self-pay

## 2021-04-17 DIAGNOSIS — N644 Mastodynia: Secondary | ICD-10-CM

## 2021-06-23 ENCOUNTER — Telehealth: Payer: Medicare Other | Admitting: Internal Medicine

## 2021-06-23 ENCOUNTER — Other Ambulatory Visit: Payer: Self-pay

## 2021-07-06 ENCOUNTER — Ambulatory Visit (INDEPENDENT_AMBULATORY_CARE_PROVIDER_SITE_OTHER): Payer: Medicare Other | Admitting: Internal Medicine

## 2021-07-06 ENCOUNTER — Encounter (HOSPITAL_BASED_OUTPATIENT_CLINIC_OR_DEPARTMENT_OTHER): Payer: Self-pay | Admitting: Internal Medicine

## 2021-07-06 ENCOUNTER — Other Ambulatory Visit: Payer: Self-pay

## 2021-07-06 VITALS — BP 126/80 | HR 81 | Ht 63.0 in | Wt 175.0 lb

## 2021-07-06 DIAGNOSIS — E785 Hyperlipidemia, unspecified: Secondary | ICD-10-CM

## 2021-07-06 DIAGNOSIS — I10 Essential (primary) hypertension: Secondary | ICD-10-CM | POA: Diagnosis not present

## 2021-07-06 DIAGNOSIS — Z940 Kidney transplant status: Secondary | ICD-10-CM

## 2021-07-06 DIAGNOSIS — E119 Type 2 diabetes mellitus without complications: Secondary | ICD-10-CM | POA: Diagnosis not present

## 2021-07-06 MED ORDER — EZETIMIBE 10 MG PO TABS: 10.0000 mg | ORAL_TABLET | Freq: Every day | ORAL | 3 refills | Status: DC

## 2021-07-06 NOTE — Progress Notes (Signed)
LIPID CLINIC CONSULT NOTE  Chief Complaint:  Manage dyslipidemia  Primary Care Physician: Berkley Harvey, NP  Primary Cardiologist:  Quay Burow, MD  HPI:  Maureen Barnes is a 60 y.o. female who is being seen today for the evaluation of dyslipidemia at the request of Lorretta Harp, MD. this is a pleasant 60 year old female kindly referred by Dr. Gwenlyn Found for evaluation management of dyslipidemia.  She has a history of type 2 diabetes, chronic hepatitis C, hypertension and end-stage renal disease likely secondary to these risk factors and is status post renal transplant in 2020.  She is immunosuppressed.  I think she is known to have any coronary disease however has had elevated cholesterol.  Particularly her triglycerides have been high in the past.  This could be driven by immunosuppressive medications as well.  Unfortunately she cannot come off these medicines.  Her LDL when she was seen by Dr. Gwenlyn Found was 116 however recently repeated labs showed total cholesterol 167, triglycerides 137, HDL 49 and LDL of 96.  She is on 20 mg of atorvastatin but she reported after increasing atorvastatin from 20 to 40 mg at night that she had side effects.  Ideally, would like to target her LDL less than 70 given diabetes with complications.  PMHx:  Past Medical History:  Diagnosis Date   Anemia    Cervical disc disease    Chronic kidney disease (CKD), stage V (HCC)    Diabetes mellitus    Hep C w/o coma, chronic (HCC)    Hypertension    Lumbar disc disease    Peritoneal dialysis status ([place])    Renal disorder     Past Surgical History:  Procedure Laterality Date   TUBAL LIGATION      FAMHx:  Family History  Problem Relation Age of Onset   Hypertension Mother    Cancer Mother    Breast cancer Mother    Diabetes Father    Hypertension Father     SOCHx:   reports that she has never smoked. She has never used smokeless tobacco. She reports that she does not drink alcohol and  does not use drugs.  ALLERGIES:  Allergies  Allergen Reactions   Peanut Oil Anaphylaxis, Itching and Swelling    Tree nuts   Shellfish-Derived Products     Lips and thraot swell   Iodine Itching and Swelling    Shellfish-lips swell itching.   Peanut-Containing Drug Products Itching and Swelling   Shellfish Allergy Itching and Swelling    ROS: Pertinent items noted in HPI and remainder of comprehensive ROS otherwise negative.  HOME MEDS: Current Outpatient Medications on File Prior to Visit  Medication Sig Dispense Refill   amLODipine (NORVASC) 5 MG tablet Take 5 mg by mouth daily.     aspirin EC 81 MG tablet Take 81 mg by mouth daily.     atorvastatin (LIPITOR) 20 MG tablet Take 1 tablet by mouth at bedtime.     b complex-vitamin c-folic acid (NEPHRO-VITE) 0.8 MG TABS tablet Take 1 tablet by mouth daily.  6   ciclopirox (PENLAC) 8 % solution Apply topically at bedtime. Apply over nail and surrounding skin. Apply daily over previous coat. After seven (7) days, may remove with alcohol and continue cycle. 6.6 mL 4   Continuous Blood Gluc Receiver (DEXCOM G6 RECEIVER) DEVI Use as directed for continuous glucose monitoring.     Continuous Blood Gluc Sensor (DEXCOM G6 SENSOR) MISC Inject 1 sensor to the skin every  10 days for continuous glucose monitoring.     Continuous Blood Gluc Transmit (DEXCOM G6 TRANSMITTER) MISC Use as directed for continuous glucose monitoring. Reuse transmitter for 90 days then discard and replace.     diphenhydrAMINE (BENADRYL) 25 MG tablet Take 1 tablet (25 mg total) by mouth at bedtime as needed. 30 tablet 0   docusate sodium (COLACE) 100 MG capsule Take 100 mg by mouth 2 (two) times daily.     ENVARSUS XR 1 MG TB24 Take 1 tablet by mouth daily as needed.     ENVARSUS XR 4 MG TB24 Take 2 tablets by mouth daily.     famotidine (PEPCID) 10 MG tablet Take 10 mg by mouth 2 (two) times daily.     fluticasone (FLONASE) 50 MCG/ACT nasal spray Place 2 sprays into both  nostrils daily.     HUMALOG 100 UNIT/ML injection 100 Units.     insulin glargine (LANTUS) 100 UNIT/ML injection Inject 20 Units into the skin daily. As Needed     loratadine (CLARITIN) 10 MG tablet Take 10 mg by mouth as directed.     metoprolol succinate (TOPROL-XL) 50 MG 24 hr tablet Take by mouth 50 mg in the mornings and 25 mg (half tablet) in the evenings 270 tablet 1   mycophenolate (MYFORTIC) 180 MG EC tablet Take 180 mg by mouth in the morning, at noon, and at bedtime.     omeprazole (PRILOSEC) 40 MG capsule Take 20 mg by mouth daily. Takes 20 MG daily     ondansetron (ZOFRAN) 4 MG tablet Take 1 tablet (4 mg total) by mouth every 8 (eight) hours as needed for nausea or vomiting. 45 tablet 1   predniSONE (DELTASONE) 5 MG tablet Take 5 mg by mouth daily.     Probiotic Product (PROBIOTIC PEARLS ADVANTAGE PO) Take by mouth.     simethicone (MYLICON) 80 MG chewable tablet Chew 80 mg by mouth every 6 (six) hours as needed for flatulence.     sulfamethoxazole-trimethoprim (BACTRIM DS) 800-160 MG tablet 1 tablet     tiZANidine (ZANAFLEX) 2 MG tablet Take by mouth.     No current facility-administered medications on file prior to visit.    LABS/IMAGING: No results found for this or any previous visit (from the past 48 hour(s)). No results found.  LIPID PANEL: No results found for: CHOL, TRIG, HDL, CHOLHDL, VLDL, LDLCALC, LDLDIRECT  WEIGHTS: Wt Readings from Last 3 Encounters:  07/06/21 175 lb (79.4 kg)  03/03/21 176 lb 9.6 oz (80.1 kg)  01/05/21 178 lb 12.8 oz (81.1 kg)    VITALS: BP 126/80    Pulse 81    Ht 5\' 3"  (1.6 m)    Wt 175 lb (79.4 kg)    LMP 07/10/2011    SpO2 98%    BMI 31.00 kg/m   EXAM: Deferred  EKG: Deferred  ASSESSMENT: Mixed dyslipidemia, goal LDL less than 70 Type 2 diabetes with complications Hypertension End-stage renal disease status post renal transplant-immunosuppressed History of palpitations  PLAN: 1.   [name] has a mixed dyslipidemia and  remains above target LDL less than 70 although her LDL has improved somewhat.  She may likely reach target with additional oral therapy and I recommend adding ezetimibe to her current dose of 20 mg atorvastatin.  She cannot tolerate higher statin doses because of side effects.  We will plan repeat lipids in about 3 months.  Follow-up with me at that time.  Thanks again for the kind referral.  Pixie Casino, MD, [facility], [place] Director of the Advanced Lipid Disorders &  Cardiovascular Risk Reduction Clinic Diplomate of the American Board of Clinical Lipidology Attending Cardiologist  Direct Dial: [phone]   Fax: [phone]  Website:  [email or web address] 07/06/2021, 10:54 AM

## 2021-07-06 NOTE — Patient Instructions (Signed)
Medication Instructions:  START zetia 10mg  daily  *If you need a refill on your cardiac medications before your next appointment, please call your pharmacy*   Lab Work: FASTING lipid panel to check cholesterol in about 3-4 months  If you have labs (blood work) drawn today and your tests are completely normal, you will receive your results only by: [place] (if you have MyChart) OR A paper copy in the mail If you have any lab test that is abnormal or we need to change your treatment, we will call you to review the results.   Testing/Procedures: NONE   Follow-Up: At [facility], you and your health needs are our priority.  As part of our continuing mission to provide you with exceptional heart care, we have created designated Provider Care Teams.  These Care Teams include your primary Cardiologist (physician) and Advanced Practice Providers (APPs -  Physician Assistants and Nurse Practitioners) who all work together to provide you with the care you need, when you need it.  We recommend signing up for the patient portal called "MyChart".  Sign up information is provided on this After Visit Summary.  MyChart is used to connect with patients for Virtual Visits (Telemedicine).  Patients are able to view lab/test results, encounter notes, upcoming appointments, etc.  Non-urgent messages can be sent to your provider as well.   To learn more about what you can do with MyChart, go to [email or web address].    Your next appointment:   3-4 months with Dr. Debara Pickett -- lipid clinic

## 2021-07-19 ENCOUNTER — Encounter: Payer: Self-pay | Admitting: Physical Therapy

## 2021-07-19 ENCOUNTER — Ambulatory Visit: Payer: Medicare Other | Attending: Nurse Practitioner | Admitting: Physical Therapy

## 2021-07-19 ENCOUNTER — Other Ambulatory Visit: Payer: Self-pay

## 2021-07-19 DIAGNOSIS — R293 Abnormal posture: Secondary | ICD-10-CM | POA: Insufficient documentation

## 2021-07-19 DIAGNOSIS — M6281 Muscle weakness (generalized): Secondary | ICD-10-CM | POA: Insufficient documentation

## 2021-07-19 DIAGNOSIS — R600 Localized edema: Secondary | ICD-10-CM | POA: Diagnosis not present

## 2021-07-19 DIAGNOSIS — M25511 Pain in right shoulder: Secondary | ICD-10-CM | POA: Insufficient documentation

## 2021-07-19 NOTE — Therapy (Signed)
[place] [place], [place], [address] Phone: [phone]   Fax:  [phone]  Physical Therapy Evaluation  Patient Details  Name: Maureen Barnes MRN: [id number] Date of Birth: 15-Mar-1961 Referring Provider (PT): penny jones, NP   Encounter Date: 07/19/2021   PT End of Session - 07/19/21 1553     Visit Number 1    Number of Visits 20    Date for PT Re-Evaluation 09/27/21    PT Start Time 1319    PT Stop Time 1401    PT Time Calculation (min) 42 min    Activity Tolerance Patient limited by pain    Behavior During Therapy [facility] for tasks assessed/performed             Past Medical History:  Diagnosis Date   Anemia    Cervical disc disease    Chronic kidney disease (CKD), stage V ([place])    Diabetes mellitus    Hep C w/o coma, chronic ([place])    Hypertension    Lumbar disc disease    Peritoneal dialysis status ([place])    Renal disorder     Past Surgical History:  Procedure Laterality Date   TUBAL LIGATION      There were no vitals filed for this visit.    Subjective Assessment - 07/19/21 1321     Subjective Patient reports long history of at least 20 years of R shoulder pain, which would come and go. It was alwa weak as well. It recently has gotten worse and has not resolved. She has been referred for PT as well as a consult with an Orthopedic surgeon.    Pertinent History DDD cerv and lumb, DM, anxiety, chronic Hep C, HTN, HLD, recent PF in R foot, L foot tendonitis    Limitations House hold activities;Lifting    How long can you sit comfortably? Achy, but can sit    How long can you stand comfortably? Achy, but can stand    How long can you walk comfortably? Achy, but does not impede.    Diagnostic tests None-referred to Orthopedic Surgeon.    Patient Stated Goals Increase strength and decrease Pian in R shoulder to be able to use it functionally.    Currently in Pain? Yes    Aggravating  Factors  Hurts when she moves it, not at rest.                [facility] PT Assessment - 07/19/21 0001       Assessment   Medical Diagnosis R shoulder pain    Referring Provider (PT) penny jones, NP      Precautions   Precautions Other (comment)      Balance Screen   Has the patient fallen in the past 6 months No      [place] residence    Living Arrangements Alone    Available Help at Discharge Family    Type of [place] to enter    Entrance Stairs-Number of Steps 1    Entrance Stairs-Rails None    Home Layout Two level    Alternate Level Stairs-Number of Steps 14    Alternate Level Stairs-Rails [place] - 2 wheels;Bedside commode;Cane - quad      Prior Function   Level of Independence Independent    Vocation On disability    Leisure working out, cooking,  reading, shopping, walking      Cognition   Overall Cognitive Status Within Functional Limits for tasks assessed      Observation/Other Assessments   Focus on Therapeutic Outcomes (FOTO)  47      Posture/Postural Control   Posture Comments Rounded shoulders, Dowager's Hump      ROM / Strength   AROM / PROM / Strength AROM;Strength      AROM   Overall AROM Comments B shoulder elevation approximately 80% of normal, R IR/ER approximately 60%  limited by pain.      Strength   Overall Strength Comments R Shoulder flexion and abd limited to 3/5 due to pain, unable to take resistance.      Palpation   Palpation comment Patient is severely TTP in UT, LS, Rhomboids, Infraspinatus, biceps tendon, all on R. Muscle spasms and TP noted throughout.      Special Tests    Special Tests Rotator Cuff Impingement;Biceps/Labral Tests    Other special tests Severely decreased inferior and posterior humeral glide-R    Rotator Cuff Impingment tests Michel Bickers test;Empty Can test;Painful Arc of Motion      Hawkins-Kennedy test   Findings  Positive    Side Right      Empty Can test   Findings Positive    Side Right      Painful Arc of Motion   Findings Positive    Side Right    Comments 70-130 degrees                        Objective measurements completed on examination: See above findings.                PT Education - 07/19/21 1553     Education Details POC to treat pain and inflammation, address muscle spasms, increase ROM and strength.    Person(s) Educated Patient    Methods Explanation    Comprehension Verbalized understanding              PT Short Term Goals - 07/19/21 1603       PT SHORT TERM GOAL #1   Title Pt will be independent with initial HEP    Baseline did not have time at eval    Time 4    Period Weeks    Status New    Target Date 08/16/21               PT Long Term Goals - 07/19/21 1604       PT LONG TERM GOAL #1   Title I with final HEP    Time 10    Period Weeks    Status New    Target Date 09/27/21      PT LONG TERM GOAL #2   Title Increase FOTO score to at least 61    Baseline 47    Time 10    Period Weeks    Status New    Target Date 09/27/21      PT LONG TERM GOAL #3   Title Patient will perform active R shoulder elevation to at least 150 degrees with < 4/10 pain.    Baseline Up to 8/10 pain at rest.    Time 10    Period Weeks    Status New    Target Date 09/27/21      PT LONG TERM GOAL #4   Title Increase R shoulde strenght to at least 4-/5  Baseline 3/5    Time 10    Period Weeks    Status New    Target Date 09/27/21                    Plan - 07/19/21 1554     Clinical Impression Statement Patient has long history of R shoulder pain, but it has recently exacerbated with severe pain, decreased ROm, increased spasm. Assessment confirms TTP in BUT, R LS, Rhomboids, biceps tendon, infraspinatus. She tested positive for impingement with severely decreased inferior and posterior humeral glide. She also  demonstrated weakness due to pain in R shoulder. She will benefit from PT to address pain, muscle spasm, R shoulder strength and ROM, to recover functional use of R UE and return to PLOF.    Personal Factors and Comorbidities Comorbidity 2    Comorbidities DM with kidney transplant, cerv DDD    Examination-Activity Limitations Bathing;Reach Overhead;Carry;Lift;Hygiene/Grooming    Examination-Participation Restrictions Cleaning;Laundry;Yard Work    [employer] officer Evolving/Moderate complexity    Clinical Decision Making Moderate    Rehab Potential Good    PT Frequency 2x / week    PT Duration Other (comment)   10w   PT Treatment/Interventions ADLs/Self Care Home Management;Iontophoresis 4mg /ml Dexamethasone;Moist Heat;Ultrasound;Cryotherapy;Electrical Stimulation;Manual techniques;Therapeutic exercise;Therapeutic activities;Functional mobility training;Patient/family education;Dry needling;Passive range of motion;Taping;Vasopneumatic Device    PT Next Visit Plan HEP    Recommended Other Services Patient has referral to Dr Modena Morrow and Agree with Plan of Care Patient             Patient will benefit from skilled therapeutic intervention in order to improve the following deficits and impairments:  Decreased range of motion, Decreased coordination, Increased fascial restricitons, Increased muscle spasms, Impaired UE functional use, Pain, Hypomobility, Decreased mobility, Decreased strength, Increased edema, Postural dysfunction, Improper body mechanics, Impaired flexibility  Visit Diagnosis: Localized edema  Abnormal posture  Muscle weakness (generalized)  Acute pain of right shoulder     Problem List Patient Active Problem List   Diagnosis Date Noted   Anxiety 07/17/2018   ESRD on peritoneal dialysis ([place]) 07/17/2018   H/O cesarean section 07/17/2018   Evaluation by medical service required 04/02/2018   Pre-transplant evaluation for end stage renal  disease 11/18/2017   Unilateral inguinal hernia 11/18/2017   Hyperlipidemia 01/22/2017   Palpitations 01/22/2017   Dyspnea on exertion 01/22/2017   CKD (chronic kidney disease) stage 3, GFR 30-59 ml/min (HCC) 10/18/2016   Non-compliance 10/18/2016   Chronic ethmoidal sinusitis 10/18/2016   Hepatitis C, chronic ([place]) 05/15/2012   Diabetes mellitus type 2, insulin dependent ([place]) 05/15/2012   Hypertension 05/15/2012   Anemia 05/15/2012    Marcelina Morel, DPT 07/19/2021, 4:12 PM  [place]. [place], [place], [address] Phone: [phone]   Fax:  [phone]  Name: JESSIKAH DICKER MRN: [id number] Date of Birth: 1960-11-03

## 2021-07-20 ENCOUNTER — Other Ambulatory Visit: Payer: Self-pay | Admitting: Orthopedic Surgery

## 2021-07-20 ENCOUNTER — Ambulatory Visit: Payer: Medicare Other | Admitting: Physical Therapy

## 2021-07-20 DIAGNOSIS — M12811 Other specific arthropathies, not elsewhere classified, right shoulder: Secondary | ICD-10-CM

## 2021-07-20 DIAGNOSIS — R600 Localized edema: Secondary | ICD-10-CM | POA: Diagnosis not present

## 2021-07-20 DIAGNOSIS — M6281 Muscle weakness (generalized): Secondary | ICD-10-CM

## 2021-07-20 DIAGNOSIS — M25511 Pain in right shoulder: Secondary | ICD-10-CM

## 2021-07-20 NOTE — Therapy (Signed)
[place]. [place], [place], [address] Phone: [phone]   Fax:  [phone]  Physical Therapy Treatment  Patient Details  Name: Maureen Barnes MRN: [id number] Date of Birth: 03/09/1961 Referring Provider (PT): penny jones, NP   Encounter Date: 07/20/2021   PT End of Session - 07/20/21 1057     Visit Number 2    Number of Visits 20    Date for PT Re-Evaluation 09/27/21    PT Start Time 1028    PT Stop Time 1108    PT Time Calculation (min) 40 min             Past Medical History:  Diagnosis Date   Anemia    Cervical disc disease    Chronic kidney disease (CKD), stage V ([place])    Diabetes mellitus    Hep C w/o coma, chronic ([place])    Hypertension    Lumbar disc disease    Peritoneal dialysis status ([place])    Renal disorder     Past Surgical History:  Procedure Laterality Date   TUBAL LIGATION      There were no vitals filed for this visit.   Subjective Assessment - 07/20/21 1030     Subjective pt 13 min late. "pt states she found out this morning she had had large calcium deposit ad the are sending for MRI"    Currently in Pain? Yes    Pain Score 6     Pain Location Shoulder    Pain Orientation Right                               [facility] Adult PT Treatment/Exercise - 07/20/21 0001       Exercises   Exercises Shoulder      Shoulder Exercises: Standing   Other Standing Exercises finger ladder flex and abd 5 each for ROM      Shoulder Exercises: ROM/Strengthening   UBE (Upper Arm Bike) L 2 2 in fwd/2 min back      Modalities   Modalities Iontophoresis      Iontophoresis   Type of Iontophoresis Dexamethasone    Location RT ant shld    Dose 1.2 cc    Time leave on patch      Manual Therapy   Manual Therapy Joint mobilization;Passive ROM    Manual therapy comments very pain limited and tender over bicep tendon incertion    Joint Mobilization RT shld     Passive ROM rt shld                     PT Education - 07/20/21 1039     Education Details [id number]- cane ex for ROM and scap stab with red tband    Person(s) Educated Patient    Methods Explanation;Demonstration;Handout;Verbal cues    Comprehension Verbalized understanding;Returned demonstration              PT Short Term Goals - 07/19/21 1603       PT SHORT TERM GOAL #1   Title Pt will be independent with initial HEP    Baseline did not have time at eval    Time 4    Period Weeks    Status New    Target Date 08/16/21               PT Long Term Goals - 07/19/21 1604  PT LONG TERM GOAL #1   Title I with final HEP    Time 10    Period Weeks    Status New    Target Date 09/27/21      PT LONG TERM GOAL #2   Title Increase FOTO score to at least 61    Baseline 47    Time 10    Period Weeks    Status New    Target Date 09/27/21      PT LONG TERM GOAL #3   Title Patient will perform active R shoulder elevation to at least 150 degrees with < 4/10 pain.    Baseline Up to 8/10 pain at rest.    Time 10    Period Weeks    Status New    Target Date 09/27/21      PT LONG TERM GOAL #4   Title Increase R shoulde strenght to at least 4-/5    Baseline 3/5    Time 10    Period Weeks    Status New    Target Date 09/27/21                   Plan - 07/20/21 1057     Clinical Impression Statement focus on est HEP for ROM and scap stab- performed and issued. limited tolerance to MT d/t pain but fairly goog ROM. very painful over ant RT shld and bicep incertion. trial of ionto    PT Treatment/Interventions ADLs/Self Care Home Management;Iontophoresis 4mg /ml Dexamethasone;Moist Heat;Ultrasound;Cryotherapy;Electrical Stimulation;Manual techniques;Therapeutic exercise;Therapeutic activities;Functional mobility training;Patient/family education;Dry needling;Passive range of motion;Taping;Vasopneumatic Device    PT Next Visit Plan assess and  progress             Patient will benefit from skilled therapeutic intervention in order to improve the following deficits and impairments:  Decreased range of motion, Decreased coordination, Increased fascial restricitons, Increased muscle spasms, Impaired UE functional use, Pain, Hypomobility, Decreased mobility, Decreased strength, Increased edema, Postural dysfunction, Improper body mechanics, Impaired flexibility  Visit Diagnosis: Muscle weakness (generalized)  Acute pain of right shoulder     Problem List Patient Active Problem List   Diagnosis Date Noted   Anxiety 07/17/2018   ESRD on peritoneal dialysis ([place]) 07/17/2018   H/O cesarean section 07/17/2018   Evaluation by medical service required 04/02/2018   Pre-transplant evaluation for end stage renal disease 11/18/2017   Unilateral inguinal hernia 11/18/2017   Hyperlipidemia 01/22/2017   Palpitations 01/22/2017   Dyspnea on exertion 01/22/2017   CKD (chronic kidney disease) stage 3, GFR 30-59 ml/min (HCC) 10/18/2016   Non-compliance 10/18/2016   Chronic ethmoidal sinusitis 10/18/2016   Hepatitis C, chronic ([place]) 05/15/2012   Diabetes mellitus type 2, insulin dependent ([place]) 05/15/2012   Hypertension 05/15/2012   Anemia 05/15/2012    Maureen Barnes,Maureen Barnes, PTA 07/20/2021, 10:59 AM  [place]. [place], [place], [address] Phone: [phone]   Fax:  [phone]  Name: Maureen Barnes MRN: [id number] Date of Birth: [birth year]

## 2021-07-23 ENCOUNTER — Encounter (HOSPITAL_COMMUNITY): Payer: Self-pay

## 2021-07-25 ENCOUNTER — Encounter (HOSPITAL_COMMUNITY): Payer: Self-pay

## 2021-07-25 ENCOUNTER — Other Ambulatory Visit: Payer: Self-pay

## 2021-07-25 ENCOUNTER — Ambulatory Visit: Payer: Medicare Other | Attending: Nurse Practitioner | Admitting: Physical Therapy

## 2021-07-25 DIAGNOSIS — R600 Localized edema: Secondary | ICD-10-CM | POA: Insufficient documentation

## 2021-07-25 DIAGNOSIS — M25511 Pain in right shoulder: Secondary | ICD-10-CM | POA: Diagnosis present

## 2021-07-25 DIAGNOSIS — M79671 Pain in right foot: Secondary | ICD-10-CM | POA: Diagnosis present

## 2021-07-25 DIAGNOSIS — M79672 Pain in left foot: Secondary | ICD-10-CM | POA: Diagnosis present

## 2021-07-25 DIAGNOSIS — M6281 Muscle weakness (generalized): Secondary | ICD-10-CM | POA: Diagnosis present

## 2021-07-25 DIAGNOSIS — R293 Abnormal posture: Secondary | ICD-10-CM | POA: Diagnosis present

## 2021-07-25 NOTE — Therapy (Signed)
[place]. [place], [place], [address] Phone: [phone]   Fax:  [phone]  Physical Therapy Treatment  Patient Details  Name: Maureen Barnes MRN: [id number] Date of Birth: 1960-08-31 Referring Provider (PT): penny jones, NP   Encounter Date: 07/25/2021   PT End of Session - 07/25/21 1444     Visit Number 3    Number of Visits 20    Date for PT Re-Evaluation 09/27/21    PT Start Time 1400    PT Stop Time 1450    PT Time Calculation (min) 50 min             Past Medical History:  Diagnosis Date   Anemia    Cervical disc disease    Chronic kidney disease (CKD), stage V ([place])    Diabetes mellitus    Hep C w/o coma, chronic ([place])    Hypertension    Lumbar disc disease    Peritoneal dialysis status ([place])    Renal disorder     Past Surgical History:  Procedure Laterality Date   TUBAL LIGATION      There were no vitals filed for this visit.   Subjective Assessment - 07/25/21 1406     Subjective really hurt after last session thru next day 10/10. HEP is okay- cane behind the back is hardest    Currently in Pain? Yes    Pain Score 7     Pain Location Shoulder    Pain Orientation Right                               [facility] Adult PT Treatment/Exercise - 07/25/21 0001       Shoulder Exercises: Supine   External Rotation Right;Strengthening;10 reps    External Rotation Weight (lbs) 2    Internal Rotation Strengthening;Right;10 reps    Internal Rotation Weight (lbs) 2    Diagonals Strengthening;Right;10 reps   2 directions 2#   Other Supine Exercises rt shld chest press with serratus 2# 10 x      Shoulder Exercises: Standing   Other Standing Exercises finger ladder flex and abd 5 each for ROM   with ecc lowerig for strength   Other Standing Exercises [place] 5 10 reps red tband      Shoulder Exercises: ROM/Strengthening   UBE (Upper Arm Bike) L 2 3 min fwd /3 min  back    Other ROM/Strengthening Exercises rolling ball on mat front to back ,laterally and CW and CCW   10x each     Iontophoresis   Type of Iontophoresis Dexamethasone    Location RT ant shld    Dose 1.2 cc    Time leave on patch      Manual Therapy   Manual Therapy Soft tissue mobilization;Passive ROM    Manual therapy comments good PROM with soft end feel but pain limited and very tender over bicep incertion    Soft tissue mobilization RT shld muscles with PROM to help relax    Passive ROM rt shld                       PT Short Term Goals - 07/25/21 1443       PT SHORT TERM GOAL #1   Title Pt will be independent with initial HEP    Status Achieved  PT Long Term Goals - 07/19/21 1604       PT LONG TERM GOAL #1   Title I with final HEP    Time 10    Period Weeks    Status New    Target Date 09/27/21      PT LONG TERM GOAL #2   Title Increase FOTO score to at least 61    Baseline 47    Time 10    Period Weeks    Status New    Target Date 09/27/21      PT LONG TERM GOAL #3   Title Patient will perform active R shoulder elevation to at least 150 degrees with < 4/10 pain.    Baseline Up to 8/10 pain at rest.    Time 10    Period Weeks    Status New    Target Date 09/27/21      PT LONG TERM GOAL #4   Title Increase R shoulde strenght to at least 4-/5    Baseline 3/5    Time 10    Period Weeks    Status New    Target Date 09/27/21                   Plan - 07/25/21 1444     Clinical Impression Statement STG met. progressed ex- pt toelrated fair, needed verb and tactile cuing to prevent compensations and very vocal with pain. PROM soft end feel but limited but pain and muscle guarding with increased tenderness over RT bicep origin.trial of ionto 1 more time d/t tenderness in bicpe areaa will D/C if no changes?    PT Treatment/Interventions ADLs/Self Care Home Management;Iontophoresis 80m/ml Dexamethasone;Moist  Heat;Ultrasound;Cryotherapy;Electrical Stimulation;Manual techniques;Therapeutic exercise;Therapeutic activities;Functional mobility training;Patient/family education;Dry needling;Passive range of motion;Taping;Vasopneumatic Device    PT Next Visit Plan assess ionto and D/C if NE ,focus on strength and func ROM to see if getting more motion and stronger decreases pain             Patient will benefit from skilled therapeutic intervention in order to improve the following deficits and impairments:  Decreased range of motion, Decreased coordination, Increased fascial restricitons, Increased muscle spasms, Impaired UE functional use, Pain, Hypomobility, Decreased mobility, Decreased strength, Increased edema, Postural dysfunction, Improper body mechanics, Impaired flexibility  Visit Diagnosis: Muscle weakness (generalized)  Acute pain of right shoulder     Problem List Patient Active Problem List   Diagnosis Date Noted   Anxiety 07/17/2018   ESRD on peritoneal dialysis ([place] 07/17/2018   H/O cesarean section 07/17/2018   Evaluation by medical service required 04/02/2018   Pre-transplant evaluation for end stage renal disease 11/18/2017   Unilateral inguinal hernia 11/18/2017   Hyperlipidemia 01/22/2017   Palpitations 01/22/2017   Dyspnea on exertion 01/22/2017   CKD (chronic kidney disease) stage 3, GFR 30-59 ml/min (HCC) 10/18/2016   Non-compliance 10/18/2016   Chronic ethmoidal sinusitis 10/18/2016   Hepatitis C, chronic ([place] 05/15/2012   Diabetes mellitus type 2, insulin dependent ([facility] [place] 05/15/2012   Hypertension 05/15/2012   Anemia 05/15/2012    Chandlor Noecker,ANGIE, PTA 07/25/2021, 2:47 PM  [place] [address]Phone: [phone]  Fax:  [phone] Name: TELVERTA DIMICELIMRN: [id number] of Birth: 126-Sep-1962

## 2021-07-27 ENCOUNTER — Encounter: Payer: Self-pay | Admitting: Physical Therapy

## 2021-07-27 ENCOUNTER — Ambulatory Visit: Payer: Medicare Other | Admitting: Physical Therapy

## 2021-07-27 ENCOUNTER — Other Ambulatory Visit: Payer: Self-pay

## 2021-07-27 DIAGNOSIS — M6281 Muscle weakness (generalized): Secondary | ICD-10-CM

## 2021-07-27 DIAGNOSIS — M25511 Pain in right shoulder: Secondary | ICD-10-CM

## 2021-07-27 DIAGNOSIS — R600 Localized edema: Secondary | ICD-10-CM

## 2021-07-27 NOTE — Therapy (Signed)
[place]. [place], [place], [address] Phone: [phone]   Fax:  [phone]  Physical Therapy Treatment  Patient Details  Name: Maureen Barnes MRN: [id number] Date of Birth: 1960-10-03 Referring Provider (PT): penny jones, NP   Encounter Date: 07/27/2021   PT End of Session - 07/27/21 1214     Visit Number 4    Date for PT Re-Evaluation 09/27/21    PT Start Time 1140    PT Stop Time 1215    PT Time Calculation (min) 35 min    Activity Tolerance Patient limited by pain    Behavior During Therapy [facility] for tasks assessed/performed             Past Medical History:  Diagnosis Date   Anemia    Cervical disc disease    Chronic kidney disease (CKD), stage V ([place])    Diabetes mellitus    Hep C w/o coma, chronic ([place])    Hypertension    Lumbar disc disease    Peritoneal dialysis status ([place])    Renal disorder     Past Surgical History:  Procedure Laterality Date   TUBAL LIGATION      There were no vitals filed for this visit.   Subjective Assessment - 07/27/21 1143     Subjective Doing ok long as she is not moving her arm.    Currently in Pain? Yes    Pain Score 7     Pain Location Shoulder    Pain Orientation Right;Anterior                               [facility] Adult PT Treatment/Exercise - 07/27/21 0001       Shoulder Exercises: Standing   External Rotation Strengthening;Both;Theraband;20 reps    Theraband Level (Shoulder External Rotation) Level 1 (Yellow)    Internal Rotation Strengthening;Both;20 reps;Theraband    Theraband Level (Shoulder Internal Rotation) Level 1 (Yellow)    Flexion Both;Limitations;20 reps;Strengthening    Shoulder Flexion Weight (lbs) 2   1lb second set   ABduction Weights;20 reps;Both;Strengthening    Shoulder ABduction Weight (lbs) 1    Extension Strengthening;Both;20 reps;Theraband    Theraband Level (Shoulder Extension) Level 2 (Red)     Row Strengthening;Both;20 reps;Theraband    Theraband Level (Shoulder Row) Level 2 (Red)    Other Standing Exercises AAROM Flex, Ext, IR x10      Shoulder Exercises: ROM/Strengthening   UBE (Upper Arm Bike) L 2 3 min fwd /3 min back      Iontophoresis   Type of Iontophoresis Dexamethasone    Location RT ant shld    Dose 1.2 cc    Time leave on patch      Manual Therapy   Manual Therapy Soft tissue mobilization;Passive ROM    Manual therapy comments good PROM with soft end feel but pain limited and very tender over bicep incertion    Passive ROM rt shld                       PT Short Term Goals - 07/25/21 1443       PT SHORT TERM GOAL #1   Title Pt will be independent with initial HEP    Status Achieved               PT Long Term Goals - 07/19/21 1604  PT LONG TERM GOAL #1   Title I with final HEP    Time 10    Period Weeks    Status New    Target Date 09/27/21      PT LONG TERM GOAL #2   Title Increase FOTO score to at least 61    Baseline 47    Time 10    Period Weeks    Status New    Target Date 09/27/21      PT LONG TERM GOAL #3   Title Patient will perform active R shoulder elevation to at least 150 degrees with < 4/10 pain.    Baseline Up to 8/10 pain at rest.    Time 10    Period Weeks    Status New    Target Date 09/27/21      PT LONG TERM GOAL #4   Title Increase R shoulde strenght to at least 4-/5    Baseline 3/5    Time 10    Period Weeks    Status New    Target Date 09/27/21                   Plan - 07/27/21 1214     Clinical Impression Statement Pt has very good passive R shoulder ROM, despite very limited with AAROM. She is very tender over R biceps origin. She did well with all exercise  interventions du did report some UE fatigue. Very limited motion with flexion and abduction. Ionto to assist with pain.    Personal Factors and Comorbidities Comorbidity 2    Comorbidities DM with kidney transplant,  cerv DDD    Examination-Activity Limitations Bathing;Reach Overhead;Carry;Lift;Hygiene/Grooming    Examination-Participation Restrictions Cleaning;Laundry;Yard Work    Stability/Clinical Decision Making Evolving/Moderate complexity    Rehab Potential Good    PT Frequency 2x / week    PT Treatment/Interventions ADLs/Self Care Home Management;Iontophoresis 4mg /ml Dexamethasone;Moist Heat;Ultrasound;Cryotherapy;Electrical Stimulation;Manual techniques;Therapeutic exercise;Therapeutic activities;Functional mobility training;Patient/family education;Dry needling;Passive range of motion;Taping;Vasopneumatic Device    PT Next Visit Plan assess ionto ,focus on strength and func ROM to see if getting more motion and stronger decreases pain             Patient will benefit from skilled therapeutic intervention in order to improve the following deficits and impairments:  Decreased range of motion, Decreased coordination, Increased fascial restricitons, Increased muscle spasms, Impaired UE functional use, Pain, Hypomobility, Decreased mobility, Decreased strength, Increased edema, Postural dysfunction, Improper body mechanics, Impaired flexibility  Visit Diagnosis: Acute pain of right shoulder  Muscle weakness (generalized)  Localized edema     Problem List Patient Active Problem List   Diagnosis Date Noted   Anxiety 07/17/2018   ESRD on peritoneal dialysis ([place]) 07/17/2018   H/O cesarean section 07/17/2018   Evaluation by medical service required 04/02/2018   Pre-transplant evaluation for end stage renal disease 11/18/2017   Unilateral inguinal hernia 11/18/2017   Hyperlipidemia 01/22/2017   Palpitations 01/22/2017   Dyspnea on exertion 01/22/2017   CKD (chronic kidney disease) stage 3, GFR 30-59 ml/min (HCC) 10/18/2016   Non-compliance 10/18/2016   Chronic ethmoidal sinusitis 10/18/2016   Hepatitis C, chronic ([place]) 05/15/2012   Diabetes mellitus type 2, insulin dependent ([place])  05/15/2012   Hypertension 05/15/2012   Anemia 05/15/2012    Scot Jun, PTA 07/27/2021, 12:22 PM  [place]. [place], [place], [address] Phone: [phone]   Fax:  [phone]  Name: Maureen Barnes  MRN: [id number] Date of Birth: [birth year]

## 2021-07-30 ENCOUNTER — Other Ambulatory Visit: Payer: Self-pay

## 2021-07-30 ENCOUNTER — Ambulatory Visit
Admission: RE | Admit: 2021-07-30 | Discharge: 2021-07-30 | Disposition: A | Discharge status: Home / Self Care | Payer: Medicare Other | Source: Ambulatory Visit | Attending: Orthopedic Surgery | Admitting: Orthopedic Surgery

## 2021-07-30 DIAGNOSIS — M12811 Other specific arthropathies, not elsewhere classified, right shoulder: Secondary | ICD-10-CM

## 2021-08-03 ENCOUNTER — Encounter: Payer: Self-pay | Admitting: Physical Therapy

## 2021-08-03 ENCOUNTER — Other Ambulatory Visit: Payer: Self-pay

## 2021-08-03 ENCOUNTER — Encounter (HOSPITAL_COMMUNITY): Payer: Self-pay

## 2021-08-03 ENCOUNTER — Ambulatory Visit: Payer: Medicare Other | Admitting: Physical Therapy

## 2021-08-03 DIAGNOSIS — M6281 Muscle weakness (generalized): Secondary | ICD-10-CM

## 2021-08-03 DIAGNOSIS — R600 Localized edema: Secondary | ICD-10-CM

## 2021-08-03 DIAGNOSIS — M25511 Pain in right shoulder: Secondary | ICD-10-CM

## 2021-08-03 NOTE — Therapy (Signed)
[place]. [place], [place], [address] Phone: [phone]   Fax:  [phone]  Physical Therapy Treatment  Patient Details  Name: Maureen Barnes MRN: [id number] Date of Birth: Dec 02, 1960 Referring Provider (PT): penny jones, NP   Encounter Date: 08/03/2021   PT End of Session - 08/03/21 1215     Visit Number 5    Number of Visits 20    Date for PT Re-Evaluation 09/27/21    PT Start Time 6153    PT Stop Time 1215    PT Time Calculation (min) 30 min    Activity Tolerance Patient tolerated treatment well    Behavior During Therapy WFL for tasks assessed/performed             Past Medical History:  Diagnosis Date   Anemia    Cervical disc disease    Chronic kidney disease (CKD), stage V ([place])    Diabetes mellitus    Hep C w/o coma, chronic ([place])    Hypertension    Lumbar disc disease    Peritoneal dialysis status ([place])    Renal disorder     Past Surgical History:  Procedure Laterality Date   TUBAL LIGATION      There were no vitals filed for this visit.   Subjective Assessment - 08/03/21 1147     Subjective "Doing all right"  Got an injection Monday    Pertinent History DDD cerv and lumb, DM, anxiety, chronic Hep C, HTN, HLD, recent PF in R foot, L foot tendonitis    Currently in Pain? Yes    Pain Score 6     Pain Location Shoulder    Pain Orientation Right                               [facility] Adult PT Treatment/Exercise - 08/03/21 0001       Shoulder Exercises: Standing   External Rotation Strengthening;Both;Theraband;20 reps    Theraband Level (Shoulder External Rotation) Level 2 (Red)    Internal Rotation Strengthening;Both;20 reps;Theraband    Theraband Level (Shoulder Internal Rotation) Level 2 (Red)    Extension Strengthening;Both;20 reps;Theraband    Theraband Level (Shoulder Extension) Level 3 (Green)    Row Strengthening;Both;20 reps;Theraband     Theraband Level (Shoulder Row) Level 2 (Red)    Other Standing Exercises Tricep Ext 15lb 2x10      Shoulder Exercises: ROM/Strengthening   UBE (Upper Arm Bike) L 2 3 min fwd /3 min back      Shoulder Exercises: Power Leisure centre manager & Lats 20lb 2x10    Other Power Tower Exercises Chest press 5lb 2x10                       PT Short Term Goals - 07/25/21 1443       PT SHORT TERM GOAL #1   Title Pt will be independent with initial HEP    Status Achieved               PT Long Term Goals - 08/03/21 1218       PT LONG TERM GOAL #1   Title I with final HEP    Status Partially Met      PT LONG TERM GOAL #2   Title Increase FOTO score to at least 61    Status On-going  PT LONG TERM GOAL #3   Title Patient will perform active R shoulder elevation to at least 150 degrees with < 4/10 pain.    Status Partially Met      PT LONG TERM GOAL #4   Title Increase R shoulde strenght to at least 4-/5    Status On-going                   Plan - 08/03/21 1215     Clinical Impression Statement Pt enters clinic feeling fine from recent injection in R shoulder. No reports of increase pain during today's session. Pt able to progress machine level strengthening without issue. Tactile cues for RUE placement needed with external rotation.    Personal Factors and Comorbidities Comorbidity 2    Examination-Participation Restrictions Cleaning;Laundry;Yard Work    Stability/Clinical Decision Making Evolving/Moderate complexity    Rehab Potential Good    PT Treatment/Interventions ADLs/Self Care Home Management;Iontophoresis 32m/ml Dexamethasone;Moist Heat;Ultrasound;Cryotherapy;Electrical Stimulation;Manual techniques;Therapeutic exercise;Therapeutic activities;Functional mobility training;Patient/family education;Dry needling;Passive range of motion;Taping;Vasopneumatic Device    PT Next Visit Plan assess ionto ,focus on strength and func ROM to  see if getting more motion and stronger decreases pain             Patient will benefit from skilled therapeutic intervention in order to improve the following deficits and impairments:     Visit Diagnosis: Acute pain of right shoulder  Localized edema  Muscle weakness (generalized)     Problem List Patient Active Problem List   Diagnosis Date Noted   Anxiety 07/17/2018   ESRD on peritoneal dialysis ([place] 07/17/2018   H/O cesarean section 07/17/2018   Evaluation by medical service required 04/02/2018   Pre-transplant evaluation for end stage renal disease 11/18/2017   Unilateral inguinal hernia 11/18/2017   Hyperlipidemia 01/22/2017   Palpitations 01/22/2017   Dyspnea on exertion 01/22/2017   CKD (chronic kidney disease) stage 3, GFR 30-59 ml/min (HCC) 10/18/2016   Non-compliance 10/18/2016   Chronic ethmoidal sinusitis 10/18/2016   Hepatitis C, chronic ([place] 05/15/2012   Diabetes mellitus type 2, insulin dependent ([place] 05/15/2012   Hypertension 05/15/2012   Anemia 05/15/2012    RScot Jun PTA 08/03/2021, 12:19 PM  [place] [address]Phone: [phone]  Fax:  [phone] Name: Maureen SZALKOWSKIMRN: [id number] of Birth: 1November 23, 1962

## 2021-08-08 ENCOUNTER — Ambulatory Visit: Payer: Medicare Other | Admitting: Physical Therapy

## 2021-08-08 ENCOUNTER — Other Ambulatory Visit: Payer: Self-pay

## 2021-08-08 ENCOUNTER — Telehealth: Payer: Self-pay | Admitting: *Deleted

## 2021-08-08 ENCOUNTER — Encounter: Payer: Self-pay | Admitting: Physical Therapy

## 2021-08-08 DIAGNOSIS — M6281 Muscle weakness (generalized): Secondary | ICD-10-CM | POA: Diagnosis not present

## 2021-08-08 DIAGNOSIS — R600 Localized edema: Secondary | ICD-10-CM

## 2021-08-08 DIAGNOSIS — M25511 Pain in right shoulder: Secondary | ICD-10-CM

## 2021-08-08 NOTE — Telephone Encounter (Signed)
° °  Primary Cardiologist: Quay Burow, MD  Chart reviewed as part of pre-operative protocol coverage. Given past medical history and time since last visit, based on ACC/AHA guidelines, RUBYE STROHMEYER would be at acceptable risk for the planned procedure without further cardiovascular testing.   Her RCRI is a class IV risk, 11% risk of major cardiac event.  Her aspirin is prescribed by her PCP.  She will need to receive recommendations for holding aspirin from the prescribing provider.  I will route this recommendation to the requesting party via Epic fax function and remove from pre-op pool.  Please call with questions.  Jossie Ng. Desman Polak NP-C    08/08/2021, 3:21 PM [place] [address] Office [phone] Fax [phone]

## 2021-08-08 NOTE — Therapy (Signed)
[place]. [place], [place], [address] Phone: [phone]   Fax:  [phone]  Physical Therapy Treatment  Patient Details  Name: EARL ZELLMER MRN: [id number] Date of Birth: [birth year] Referring Provider (PT): penny jones, NP   Encounter Date: 08/08/2021   PT End of Session - 08/08/21 1421     Visit Number 6    Number of Visits 20    Date for PT Re-Evaluation 09/27/21    PT Start Time 6270    PT Stop Time 3500    PT Time Calculation (min) 35 min    Activity Tolerance No increased pain             Past Medical History:  Diagnosis Date   Anemia    Cervical disc disease    Chronic kidney disease (CKD), stage V ([place])    Diabetes mellitus    Hep C w/o coma, chronic ([place])    Hypertension    Lumbar disc disease    Peritoneal dialysis status ([place])    Renal disorder     Past Surgical History:  Procedure Laterality Date   TUBAL LIGATION      There were no vitals filed for this visit.   Subjective Assessment - 08/08/21 1345     Subjective "Doing ok" No pain. Waiting to receive surgery for calcification removal on R shoulder    Currently in Pain? No/denies                [facility] PT Assessment - 08/08/21 0001       AROM   AROM Assessment Site Shoulder    Right/Left Shoulder Right    Right Shoulder Flexion 140 Degrees    Right Shoulder ABduction 108 Degrees                           [facility] Adult PT Treatment/Exercise - 08/08/21 0001       Shoulder Exercises: Standing   External Rotation Strengthening;Both;Theraband;20 reps    Theraband Level (Shoulder External Rotation) Level 2 (Red)    Internal Rotation Strengthening;Both;20 reps;Theraband    Theraband Level (Shoulder Internal Rotation) Level 2 (Red)    Flexion Both;Limitations;20 reps;Strengthening    Shoulder Flexion Weight (lbs) 1    ABduction Weights;20 reps;Both;Strengthening    Shoulder ABduction Weight  (lbs) 1    Extension Strengthening;Both;20 reps;Theraband    Theraband Level (Shoulder Extension) Level 3 (Green)    Row Strengthening;Both;20 reps;Theraband    Theraband Level (Shoulder Row) Level 3 (Green)    Other Standing Exercises Tricep Ext 15lb 2x10      Shoulder Exercises: ROM/Strengthening   UBE (Upper Arm Bike) L 2 3 min fwd /3 min back      Shoulder Exercises: Power [employer]   Other Power [employer] Exercises Chest press 5lb x10                       PT Short Term Goals - 07/25/21 1443       PT SHORT TERM GOAL #1   Title Pt will be independent with initial HEP    Status Achieved               PT Long Term Goals - 08/03/21 1218       PT LONG TERM GOAL #1   Title I with final HEP    Status Partially Met      PT LONG TERM  GOAL #2   Title Increase FOTO score to at least 61    Status On-going      PT LONG TERM GOAL #3   Title Patient will perform active R shoulder elevation to at least 150 degrees with < 4/10 pain.    Status Partially Met      PT LONG TERM GOAL #4   Title Increase R shoulde strenght to at least 4-/5    Status On-going                   Plan - 08/08/21 1422     Clinical Impression Statement Pt limited today by pain. After warm up R should AROM was taken. Pain reported with lowering her harm from flexion. Increase pain with internal and external rotation prevented ROM measurement. Pt was able to complete ER/IR with resistance elbow to her side. Increase L shoulder pain also causes the discontinuation of seated chest press. Some shoulder elevation noted with resisted flexion.    Personal Factors and Comorbidities Comorbidity 2    Comorbidities DM with kidney transplant, cerv DDD    Examination-Activity Limitations Bathing;Reach Overhead;Carry;Lift;Hygiene/Grooming    Examination-Participation Restrictions Cleaning;Laundry;Yard Work    Rehab Potential Good    PT Frequency 2x / week    PT Treatment/Interventions ADLs/Self Care  Home Management;Iontophoresis 19m/ml Dexamethasone;Moist Heat;Ultrasound;Cryotherapy;Electrical Stimulation;Manual techniques;Therapeutic exercise;Therapeutic activities;Functional mobility training;Patient/family education;Dry needling;Passive range of motion;Taping;Vasopneumatic Device    PT Next Visit Plan focus on strength and func ROM to see if getting more motion and stronger decreases pain             Patient will benefit from skilled therapeutic intervention in order to improve the following deficits and impairments:  Decreased range of motion, Decreased coordination, Increased fascial restricitons, Increased muscle spasms, Impaired UE functional use, Pain, Hypomobility, Decreased mobility, Decreased strength, Increased edema, Postural dysfunction, Improper body mechanics, Impaired flexibility  Visit Diagnosis: Acute pain of right shoulder  Localized edema  Muscle weakness (generalized)     Problem List Patient Active Problem List   Diagnosis Date Noted   Anxiety 07/17/2018   ESRD on peritoneal dialysis ([place] 07/17/2018   H/O cesarean section 07/17/2018   Evaluation by medical service required 04/02/2018   Pre-transplant evaluation for end stage renal disease 11/18/2017   Unilateral inguinal hernia 11/18/2017   Hyperlipidemia 01/22/2017   Palpitations 01/22/2017   Dyspnea on exertion 01/22/2017   CKD (chronic kidney disease) stage 3, GFR 30-59 ml/min (HCC) 10/18/2016   Non-compliance 10/18/2016   Chronic ethmoidal sinusitis 10/18/2016   Hepatitis C, chronic ([place] 05/15/2012   Diabetes mellitus type 2, insulin dependent ([place] 05/15/2012   Hypertension 05/15/2012   Anemia 05/15/2012    RScot Jun PTA 08/08/2021, 2:26 PM  [place] [address]Phone: [phone]  Fax:  [phone] Name: TJAMEELA MICHNAMRN: [id number] of Birth: 108/15/1962

## 2021-08-08 NOTE — Telephone Encounter (Signed)
° °  Pre-operative Risk Assessment    Patient Name: Maureen Barnes  DOB: September 14, 1960 MRN: [id number]      Request for Surgical Clearance    Procedure:   right shoulder scope  Date of Surgery:  Clearance TBD                                 Surgeon:  Dr Earlie Server Surgeon's Group or Practice Name:  [facility]-  Phone number:  [phone] attn: sherri  Fax number:  [phone]  attn: Sherri    Type of Clearance Requested:   Medical Pharmacy - Aspirin    Type of Anesthesia:  Not Indicated   Additional requests/questions:  Please advise surgeon/provider what medications should be held.  Maureen Barnes   08/08/2021, 1:36 PM

## 2021-08-10 ENCOUNTER — Encounter: Payer: Self-pay | Admitting: Physical Therapy

## 2021-08-10 ENCOUNTER — Ambulatory Visit: Payer: Medicare Other | Admitting: Physical Therapy

## 2021-08-10 ENCOUNTER — Other Ambulatory Visit: Payer: Self-pay

## 2021-08-10 DIAGNOSIS — M25511 Pain in right shoulder: Secondary | ICD-10-CM

## 2021-08-10 DIAGNOSIS — R600 Localized edema: Secondary | ICD-10-CM

## 2021-08-10 DIAGNOSIS — M6281 Muscle weakness (generalized): Secondary | ICD-10-CM | POA: Diagnosis not present

## 2021-08-10 NOTE — Therapy (Signed)
[place]. [place], [place], [address] Phone: [phone]   Fax:  [phone]  Physical Therapy Treatment  Patient Details  Name: Maureen Barnes MRN: [id number] Date of Birth: 1961/02/08 Referring Provider (PT): penny jones, NP   Encounter Date: 08/10/2021   PT End of Session - 08/10/21 1420     Visit Number 7    Date for PT Re-Evaluation 09/27/21    PT Start Time 1345    PT Stop Time 1425    PT Time Calculation (min) 40 min    Activity Tolerance Patient tolerated treatment well    Behavior During Therapy WFL for tasks assessed/performed             Past Medical History:  Diagnosis Date   Anemia    Cervical disc disease    Chronic kidney disease (CKD), stage V ([place])    Diabetes mellitus    Hep C w/o coma, chronic ([place])    Hypertension    Lumbar disc disease    Peritoneal dialysis status ([place])    Renal disorder     Past Surgical History:  Procedure Laterality Date   TUBAL LIGATION      There were no vitals filed for this visit.   Subjective Assessment - 08/10/21 1342     Subjective "Doing all right"    Currently in Pain? Yes    Pain Score 5     Pain Location Shoulder    Pain Orientation Right                               [facility] Adult PT Treatment/Exercise - 08/10/21 0001       Shoulder Exercises: Standing   Flexion Both;Limitations;20 reps;Strengthening    Shoulder Flexion Weight (lbs) 2    ABduction Weights;20 reps;Both;Strengthening    Shoulder ABduction Weight (lbs) 1    Extension Strengthening;Both;20 reps;Weights    Extension Weight (lbs) 5    Row Strengthening;Both;20 reps;Weights    Row Weight (lbs) 10    Other Standing Exercises Tricep Ext 15lb 2x10      Shoulder Exercises: ROM/Strengthening   UBE (Upper Arm Bike) L 2 3 min fwd /3 min back      Shoulder Exercises: Power [employer]   Other Power Tower Exercises Rows & Lats 20lb 2x10                        PT Short Term Goals - 07/25/21 1443       PT SHORT TERM GOAL #1   Title Pt will be independent with initial HEP    Status Achieved               PT Long Term Goals - 08/10/21 1420       PT LONG TERM GOAL #1   Title I with final HEP    Status Achieved      PT LONG TERM GOAL #2   Title Increase FOTO score to at least 61    Status On-going      PT LONG TERM GOAL #3   Title Patient will perform active R shoulder elevation to at least 150 degrees with < 4/10 pain.    Status Partially Met      PT LONG TERM GOAL #4   Title Increase R shoulde strenght to at least 4-/5    Status On-going  Plan - 08/10/21 1421     Clinical Impression Statement Pt did well overall today progressing with interventions. Postural cue required with standing shoulder extensions. Increase R shoulder fatigue reported with shoulder internal rotation. No reports of pain throughout session. Increase resistance tolerated with standing shoulder abduction and flexion.    Personal Factors and Comorbidities Comorbidity 2    Comorbidities DM with kidney transplant, cerv DDD    Examination-Activity Limitations Bathing;Reach Overhead;Carry;Lift;Hygiene/Grooming    Examination-Participation Restrictions Cleaning;Laundry;Yard Work    Stability/Clinical Decision Making Evolving/Moderate complexity    Rehab Potential Good    PT Frequency 2x / week    PT Treatment/Interventions ADLs/Self Care Home Management;Iontophoresis 23m/ml Dexamethasone;Moist Heat;Ultrasound;Cryotherapy;Electrical Stimulation;Manual techniques;Therapeutic exercise;Therapeutic activities;Functional mobility training;Patient/family education;Dry needling;Passive range of motion;Taping;Vasopneumatic Device    PT Next Visit Plan focus on strength and func ROM to see if getting more motion and stronger decreases pain             Patient will benefit from skilled therapeutic intervention in order  to improve the following deficits and impairments:  Decreased range of motion, Decreased coordination, Increased fascial restricitons, Increased muscle spasms, Impaired UE functional use, Pain, Hypomobility, Decreased mobility, Decreased strength, Increased edema, Postural dysfunction, Improper body mechanics, Impaired flexibility  Visit Diagnosis: Acute pain of right shoulder  Localized edema  Muscle weakness (generalized)     Problem List Patient Active Problem List   Diagnosis Date Noted   Anxiety 07/17/2018   ESRD on peritoneal dialysis ([place] 07/17/2018   H/O cesarean section 07/17/2018   Evaluation by medical service required 04/02/2018   Pre-transplant evaluation for end stage renal disease 11/18/2017   Unilateral inguinal hernia 11/18/2017   Hyperlipidemia 01/22/2017   Palpitations 01/22/2017   Dyspnea on exertion 01/22/2017   CKD (chronic kidney disease) stage 3, GFR 30-59 ml/min (HCC) 10/18/2016   Non-compliance 10/18/2016   Chronic ethmoidal sinusitis 10/18/2016   [place] C, chronic ([place] 05/15/2012   Diabetes mellitus type 2, insulin dependent ([place] 05/15/2012   Hypertension 05/15/2012   Anemia 05/15/2012    RScot Jun PTA 08/10/2021, 2:24 PM  [place] [address]Phone: [phone]  Fax:  [phone] Name: Maureen MULLETMRN: [id number] of Birth: 120-Sep-1962

## 2021-08-11 ENCOUNTER — Encounter (HOSPITAL_COMMUNITY): Payer: Self-pay

## 2021-08-15 ENCOUNTER — Ambulatory Visit: Payer: Medicare Other | Admitting: Physical Therapy

## 2021-08-15 ENCOUNTER — Encounter: Payer: Self-pay | Admitting: Physical Therapy

## 2021-08-15 ENCOUNTER — Other Ambulatory Visit: Payer: Self-pay

## 2021-08-15 DIAGNOSIS — M6281 Muscle weakness (generalized): Secondary | ICD-10-CM

## 2021-08-15 DIAGNOSIS — R600 Localized edema: Secondary | ICD-10-CM

## 2021-08-15 DIAGNOSIS — M79672 Pain in left foot: Secondary | ICD-10-CM

## 2021-08-15 DIAGNOSIS — M79671 Pain in right foot: Secondary | ICD-10-CM

## 2021-08-15 DIAGNOSIS — R293 Abnormal posture: Secondary | ICD-10-CM

## 2021-08-15 NOTE — Therapy (Signed)
[facility]. [place], [place], [address] Phone: [phone]   Fax:  [phone]  Physical Therapy Evaluation  Patient Details  Name: Maureen Barnes MRN: [id number] Date of Birth: [birth year] Referring Provider (PT): penny jones, NP   Encounter Date: 08/15/2021   PT End of Session - 08/15/21 1418     Visit Number 8    Number of Visits 20    Date for PT Re-Evaluation 09/27/21    PT Start Time 1316    PT Stop Time 1412    PT Time Calculation (min) 56 min    Activity Tolerance Patient tolerated treatment well    Behavior During Therapy WFL for tasks assessed/performed             Past Medical History:  Diagnosis Date   Anemia    Cervical disc disease    Chronic kidney disease (CKD), stage V ([place])    Diabetes mellitus    Hep C w/o coma, chronic ([place])    Hypertension    Lumbar disc disease    Peritoneal dialysis status ([place])    Renal disorder     Past Surgical History:  Procedure Laterality Date   TUBAL LIGATION      There were no vitals filed for this visit.    Subjective Assessment - 08/15/21 1318     Subjective B PF, R is very painful. worst first thing in the morning, but also if she has to stand for long periods of time. She is using ice and stretching, has received and injection, but it only lasted for about a week. She received a night splint for her R foot. she bought new shoes with improved arch support that seem to be helping. She also has been fitted for orthotics and is awaiting for them to be finished.    Pertinent History DDD cerv and lumb, DM, anxiety, chronic Hep C, HTN, HLD, recent PF in R foot, L foot tendonitis    How long can you sit comfortably? N/A    How long can you stand comfortably? Increased pain with longer standing.    How long can you walk comfortably? Limited due to pain afterwards.    Patient Stated Goals Decrease pain, not having to hop or limp due to the way,  improve balance.                [facility] PT Assessment - 08/15/21 0001       Home Environment   Home Layout Two level    Alternate Level Stairs-Number of Steps 14    Alternate Level Stairs-Rails Left      Prior Function   Level of Independence Independent    Vocation On disability    Leisure working out, cooking, reading, shopping, walking      Cognition   Overall Cognitive Status Within Functional Limits for tasks assessed      Posture/Postural Control   Posture Comments B flat feet, L is more pronated than R in stance.      AROM   Overall AROM Comments B toe flexion is very limited and toe ext mildly limited as well.    AROM Assessment Site Ankle    Right/Left Shoulder Right;Left    Right/Left Ankle Right;Left    Right Ankle Dorsiflexion 25    Left Ankle Dorsiflexion 19      Palpation   Palpation comment Patient's B calves are TTP with spasms and TP noted throughout  Ambulation/Gait   Gait Comments No abnormalities noted at eval, but she reports fluctuating pain and antalgic gait first thing in the morning and after any periord of time spent sitting due to tightness.                        Objective measurements completed on examination: See above findings.                PT Education - 08/15/21 1417     Education Details initated HEP with calf stretching in sit iwth stap and in stand, including gastroc and soleus. paiten talso encouraged to massage calves prior to stretch, and to stretch toes into flexion/extension as well due to tightness.    Person(s) Educated Patient    Methods Explanation;Demonstration    Comprehension Verbalized understanding;Returned demonstration              PT Short Term Goals - 08/15/21 1423       PT SHORT TERM GOAL #1   Title Pt will be independent with initial HEP    Period Weeks    Status New    Target Date 09/05/21               PT Long Term Goals - 08/15/21 1423       PT LONG  TERM GOAL #1   Title I with final HEP    Time 6    Period Weeks    Status New    Target Date 09/27/21      PT LONG TERM GOAL #2   Title Increase FOTO score to at least 61    Baseline 47    Time 7    Period Weeks    Status On-going    Target Date 09/27/21      PT LONG TERM GOAL #3   Title Patient will perform active R shoulder elevation to at least 150 degrees with < 4/10 pain.    Baseline Up to 8/10 pain at rest.    Time 6    Period Weeks    Status On-going    Target Date 09/27/21      PT LONG TERM GOAL #4   Title Increase R shoulde strenght to at least 4-/5    Baseline 3/5    Time 6    Period Weeks    Status On-going    Target Date 09/27/21      PT LONG TERM GOAL #5   Title Patietn will report pain </+3/10 in feet and ankles at any point in the day.    Baseline 8/10 max    Time 6    Period Weeks    Status New    Target Date 09/27/21                    Plan - 08/15/21 1332     Clinical Impression Statement Patient reports severe PF pain, R> L, worst in the morning, but also increases with too much standing. Initiated HEP for STM and stretch to Gastroc and Soleus as well as toe flexors. She will benefit from more inclusive HEP for stretch and strengthen to all muscles of feet and calves, additional education for appropriate footwear to reduce pain and maintain painfree mobilization.    Personal Factors and Comorbidities Comorbidity 2    Comorbidities DM with kidney transplant, cerv DDD    Examination-Activity Limitations Bathing;Reach Overhead;Carry;Lift;Hygiene/Grooming    Examination-Participation Restrictions Cleaning;Laundry;[employer] Work  Stability/Clinical Decision Making Evolving/Moderate complexity    Clinical Decision Making Moderate    Rehab Potential Good    PT Frequency 2x / week    PT Duration 6 weeks    PT Treatment/Interventions ADLs/Self Care Home Management;Iontophoresis 4mg /ml Dexamethasone;Moist Heat;Ultrasound;Cryotherapy;Electrical  Stimulation;Manual techniques;Therapeutic exercise;Therapeutic activities;Functional mobility training;Patient/family education;Dry needling;Passive range of motion;Taping;Vasopneumatic Device    PT Next Visit Plan Update HEP to include more stretch to calves and feet as well as strengthening to feet and calves.    Consulted and Agree with Plan of Care Patient             Patient will benefit from skilled therapeutic intervention in order to improve the following deficits and impairments:  Decreased range of motion, Decreased coordination, Increased fascial restricitons, Increased muscle spasms, Impaired UE functional use, Pain, Hypomobility, Decreased mobility, Decreased strength, Increased edema, Postural dysfunction, Improper body mechanics, Impaired flexibility, Difficulty walking, Decreased balance  Visit Diagnosis: Localized edema  Muscle weakness (generalized)  Abnormal posture  Pain in left foot  Pain in right foot     Problem List Patient Active Problem List   Diagnosis Date Noted   Anxiety 07/17/2018   ESRD on peritoneal dialysis ([place]) 07/17/2018   H/O cesarean section 07/17/2018   Evaluation by medical service required 04/02/2018   Pre-transplant evaluation for end stage renal disease 11/18/2017   Unilateral inguinal hernia 11/18/2017   Hyperlipidemia 01/22/2017   Palpitations 01/22/2017   Dyspnea on exertion 01/22/2017   CKD (chronic kidney disease) stage 3, GFR 30-59 ml/min (HCC) 10/18/2016   Non-compliance 10/18/2016   Chronic ethmoidal sinusitis 10/18/2016   Hepatitis C, chronic ([place]) 05/15/2012   Diabetes mellitus type 2, insulin dependent ([place]) 05/15/2012   Hypertension 05/15/2012   Anemia 05/15/2012    Marcelina Morel, DPT 08/15/2021, 2:27 PM  [facility]. [place], [place], [address] Phone: [phone]   Fax:  [phone]  Name: EMA HEBNER MRN: [id number] Date of Birth:  02-20-1961

## 2021-08-17 ENCOUNTER — Encounter: Payer: Self-pay | Admitting: Physical Therapy

## 2021-08-17 ENCOUNTER — Other Ambulatory Visit: Payer: Self-pay

## 2021-08-17 ENCOUNTER — Ambulatory Visit: Payer: Medicare Other | Admitting: Physical Therapy

## 2021-08-17 DIAGNOSIS — M79671 Pain in right foot: Secondary | ICD-10-CM

## 2021-08-17 DIAGNOSIS — R600 Localized edema: Secondary | ICD-10-CM

## 2021-08-17 DIAGNOSIS — M79672 Pain in left foot: Secondary | ICD-10-CM

## 2021-08-17 DIAGNOSIS — M6281 Muscle weakness (generalized): Secondary | ICD-10-CM | POA: Diagnosis not present

## 2021-08-17 DIAGNOSIS — R293 Abnormal posture: Secondary | ICD-10-CM

## 2021-08-17 NOTE — Therapy (Signed)
[facility]. [place], [place], [address] Phone: [phone]   Fax:  [phone]  Physical Therapy Treatment  Patient Details  Name: Maureen Barnes MRN: [id number] Date of Birth: September 29, 1960 Referring Provider (PT): penny jones, NP   Encounter Date: 08/17/2021   PT End of Session - 08/17/21 1343     Visit Number 9    Date for PT Re-Evaluation 09/27/21    PT Start Time 1300    PT Stop Time 1345    PT Time Calculation (min) 45 min    Activity Tolerance Patient tolerated treatment well    Behavior During Therapy WFL for tasks assessed/performed             Past Medical History:  Diagnosis Date   Anemia    Cervical disc disease    Chronic kidney disease (CKD), stage V ([place])    Diabetes mellitus    Hep C w/o coma, chronic ([place])    Hypertension    Lumbar disc disease    Peritoneal dialysis status ([place])    Renal disorder     Past Surgical History:  Procedure Laterality Date   TUBAL LIGATION      There were no vitals filed for this visit.   Subjective Assessment - 08/17/21 1301     Subjective "Im doing ok" Pain with different movement, nut bot enough to make her scream    Currently in Pain? Yes    Pain Score 4     Pain Location Shoulder    Pain Orientation Right                               [facility] Adult PT Treatment/Exercise - 08/17/21 0001       Exercises   Exercises Ankle      Shoulder Exercises: Supine   External Rotation Strengthening;Right;Weights;10 reps    External Rotation Weight (lbs) 1    Internal Rotation Strengthening;Right;10 reps;Weights    Internal Rotation Weight (lbs) 1    Flexion Right;Strengthening;10 reps;Weights    Shoulder Flexion Weight (lbs) 1      Shoulder Exercises: Standing   Other Standing Exercises AAROM Flex, Ext, IR x10      Shoulder Exercises: ROM/Strengthening   UBE (Upper Arm Bike) L 2 3 min fwd /3 min back      Shoulder  Exercises: Power Leisure centre manager & Lats 20lb 2x10      Manual Therapy   Manual therapy comments good PROM with soft end feel but limited by pain    Passive ROM rt shld      Ankle Exercises: Stretches   Plantar Fascia Stretch 3 reps;10 seconds    Gastroc Stretch 4 reps;10 seconds                       PT Short Term Goals - 08/15/21 1423       PT SHORT TERM GOAL #1   Title Pt will be independent with initial HEP    Period Weeks    Status New    Target Date 09/05/21               PT Long Term Goals - 08/15/21 1423       PT LONG TERM GOAL #1   Title I with final HEP    Time 6    Period Weeks  Status New    Target Date 09/27/21      PT LONG TERM GOAL #2   Title Increase FOTO score to at least 61    Baseline 47    Time 7    Period Weeks    Status On-going    Target Date 09/27/21      PT LONG TERM GOAL #3   Title Patient will perform active R shoulder elevation to at least 150 degrees with < 4/10 pain.    Baseline Up to 8/10 pain at rest.    Time 6    Period Weeks    Status On-going    Target Date 09/27/21      PT LONG TERM GOAL #4   Title Increase R shoulde strenght to at least 4-/5    Baseline 3/5    Time 6    Period Weeks    Status On-going    Target Date 09/27/21      PT LONG TERM GOAL #5   Title Patietn will report pain </+3/10 in feet and ankles at any point in the day.    Baseline 8/10 max    Time 6    Period Weeks    Status New    Target Date 09/27/21                   Plan - 08/17/21 1343     Clinical Impression Statement Added calf and plantar fascia stretch to session with updated HEP. No issues with shoulder strengthening interventions. She has good R shoulder PROM but did report pain when bring her R up down from scaption and abduction. Some catching pain reported with internal and external rotation.    Comorbidities DM with kidney transplant, cerv DDD    Examination-Activity  Limitations Bathing;Reach Overhead;Carry;Lift;Hygiene/Grooming    Examination-Participation Restrictions Cleaning;Laundry;Yard Work    Merchant [employer] officer Evolving/Moderate complexity    PT Frequency 2x / week    PT Duration 6 weeks    PT Treatment/Interventions ADLs/Self Care Home Management;Iontophoresis 4mg /ml Dexamethasone;Moist Heat;Ultrasound;Cryotherapy;Electrical Stimulation;Manual techniques;Therapeutic exercise;Therapeutic activities;Functional mobility training;Patient/family education;Dry needling;Passive range of motion;Taping;Vasopneumatic Device    PT Next Visit Plan Assess Tx    PT Home Exercise Plan Access Code: [email or web address]  URL: [email or web address]  Date: 08/17/2021  Prepared by: Cheri Fowler    Exercises  Gastroc Stretch on Wall - 1 x daily - 7 x weekly - 3 sets - 10 reps  Seated Plantar Fascia Stretch - 1 x daily - 7 x weekly - 3 sets - 10 reps             Patient will benefit from skilled therapeutic intervention in order to improve the following deficits and impairments:  Decreased range of motion, Decreased coordination, Increased fascial restricitons, Increased muscle spasms, Impaired UE functional use, Pain, Hypomobility, Decreased mobility, Decreased strength, Increased edema, Postural dysfunction, Improper body mechanics, Impaired flexibility, Difficulty walking, Decreased balance  Visit Diagnosis: Localized edema  Pain in left foot  Muscle weakness (generalized)  Pain in right foot  Abnormal posture     Problem List Patient Active Problem List   Diagnosis Date Noted   Anxiety 07/17/2018   ESRD on peritoneal dialysis ([place]) 07/17/2018   H/O cesarean section 07/17/2018   Evaluation by medical service required 04/02/2018   Pre-transplant evaluation for end stage renal disease 11/18/2017   Unilateral inguinal hernia 11/18/2017   Hyperlipidemia 01/22/2017   Palpitations 01/22/2017   Dyspnea on exertion 01/22/2017   CKD  (chronic kidney disease) stage  3, GFR 30-59 ml/min (HCC) 10/18/2016   Non-compliance 10/18/2016   Chronic ethmoidal sinusitis 10/18/2016   Hepatitis C, chronic ([place]) 05/15/2012   Diabetes mellitus type 2, insulin dependent ([place]) 05/15/2012   Hypertension 05/15/2012   Anemia 05/15/2012    Scot Jun, PTA 08/17/2021, 1:48 PM  [facility]. [place], [place], [address] Phone: [phone]   Fax:  [phone]  Name: Maureen Barnes MRN: [id number] Date of Birth: 1960/08/12

## 2021-08-22 ENCOUNTER — Encounter: Payer: Self-pay | Admitting: Physical Therapy

## 2021-08-22 ENCOUNTER — Other Ambulatory Visit: Payer: Self-pay

## 2021-08-22 ENCOUNTER — Ambulatory Visit: Payer: Medicare Other | Admitting: Physical Therapy

## 2021-08-22 DIAGNOSIS — M79672 Pain in left foot: Secondary | ICD-10-CM

## 2021-08-22 DIAGNOSIS — M79671 Pain in right foot: Secondary | ICD-10-CM

## 2021-08-22 DIAGNOSIS — M6281 Muscle weakness (generalized): Secondary | ICD-10-CM

## 2021-08-22 DIAGNOSIS — M25511 Pain in right shoulder: Secondary | ICD-10-CM

## 2021-08-22 DIAGNOSIS — R600 Localized edema: Secondary | ICD-10-CM

## 2021-08-22 DIAGNOSIS — R293 Abnormal posture: Secondary | ICD-10-CM

## 2021-08-22 NOTE — Therapy (Signed)
[place]. [place], [place], [address] Phone: [phone]   Fax:  [phone]  Physical Therapy Treatment Progress Note Reporting Period 07/19/21 to 08/22/21  See note below for Objective Data and Assessment of Progress/Goals.     Patient Details  Name: Maureen Barnes MRN: [id number] Date of Birth: 29-Dec-1960 Referring Provider (PT): penny jones, NP   Encounter Date: 08/22/2021   PT End of Session - 08/22/21 1336     Visit Number 10    Date for PT Re-Evaluation 09/27/21    PT Start Time 1300    PT Stop Time 1345    PT Time Calculation (min) 45 min    Activity Tolerance Patient tolerated treatment well    Behavior During Therapy WFL for tasks assessed/performed             Past Medical History:  Diagnosis Date   Anemia    Cervical disc disease    Chronic kidney disease (CKD), stage V ([facility])    Diabetes mellitus    Hep C w/o coma, chronic ([facility])    Hypertension    Lumbar disc disease    Peritoneal dialysis status ([place])    Renal disorder     Past Surgical History:  Procedure Laterality Date   TUBAL LIGATION      There were no vitals filed for this visit.   Subjective Assessment - 08/22/21 1304     Subjective Pain in the arm, Pain is present with movement    Pertinent History DDD cerv and lumb, DM, anxiety, chronic Hep C, HTN, HLD, recent PF in R foot, L foot tendonitis    Currently in Pain? Yes    Pain Score 4     Pain Location Shoulder    Pain Orientation Right                [facility] PT Assessment - 08/22/21 0001       AROM   Right Shoulder Flexion 146 Degrees    Right Shoulder ABduction 134 Degrees    Right Shoulder Internal Rotation --   unable to do   Right Shoulder External Rotation 65 Degrees      Strength   Overall Strength Comments R Shoulder flexion and abd limited to -4/5.ER/IR 3/5                           [facility] Adult PT Treatment/Exercise -  08/22/21 0001       Shoulder Exercises: Standing   External Rotation Strengthening;Both;Theraband;20 reps    Theraband Level (Shoulder External Rotation) Level 3 (Green)    Internal Rotation Strengthening;Both;20 reps;Theraband    Theraband Level (Shoulder Internal Rotation) Level 3 (Green)    Flexion Both;Limitations;20 reps;Strengthening    Shoulder Flexion Weight (lbs) 2    ABduction Weights;20 reps;Both;Strengthening    Shoulder ABduction Weight (lbs) 1      Shoulder Exercises: ROM/Strengthening   UBE (Upper Arm Bike) L 2 3 min fwd /3 min back      Ankle Exercises: Stretches   Plantar Fascia Stretch 10 seconds;4 reps;20 seconds    Soleus Stretch 4 reps;20 seconds;10 seconds      Ankle Exercises: Seated   Other Seated Ankle Exercises PF green band 2x15                       PT Short Term Goals - 08/15/21 1423  PT SHORT TERM GOAL #1   Title Pt will be independent with initial HEP    Period Weeks    Status New    Target Date 09/05/21               PT Long Term Goals - 08/22/21 1342       PT LONG TERM GOAL #1   Title I with final HEP    Status Partially Met      PT LONG TERM GOAL #3   Title Patient will perform active R shoulder elevation to at least 150 degrees with < 4/10 pain.    Status Partially Met      PT LONG TERM GOAL #4   Title Increase R shoulde strenght to at least 4-/5    Status Partially Met      PT LONG TERM GOAL #5   Title Patietn will report pain </+3/10 in feet and ankles at any point in the day.    Status On-going                   Plan - 08/22/21 1336     Clinical Impression Statement Pt able to progress increasing her R shoulder AROM and strength with both flexion and abduction. When asked to abduct RUE to measure ER/IR she reports not being able to bring her arm up despite doing the motion seconds before. R shoulder weakness present with MMT for ER/IR grading 3/10 reporting pain, but pt was able to  complete RUE ER/IR against level three Thera band with full ROM. Added bilateral ankle PF against resistance without issues. Reports a stretch with gastroc and plantar fascia stretch    Personal Factors and Comorbidities Comorbidity 2    Comorbidities DM with kidney transplant, cerv DDD    Examination-Activity Limitations Bathing;Reach Overhead;Carry;Lift;Hygiene/Grooming    Examination-Participation Restrictions Cleaning;Laundry;Yard Work    Rehab Potential Good    PT Frequency 2x / week    PT Duration 6 weeks    PT Treatment/Interventions ADLs/Self Care Home Management;Iontophoresis 100m/ml Dexamethasone;Moist Heat;Ultrasound;Cryotherapy;Electrical Stimulation;Manual techniques;Therapeutic exercise;Therapeutic activities;Functional mobility training;Patient/family education;Dry needling;Passive range of motion;Taping;Vasopneumatic Device    PT Next Visit Plan Assess Tx, progress as tolerated.             Patient will benefit from skilled therapeutic intervention in order to improve the following deficits and impairments:  Decreased range of motion, Decreased coordination, Increased fascial restricitons, Increased muscle spasms, Impaired UE functional use, Pain, Hypomobility, Decreased mobility, Decreased strength, Increased edema, Postural dysfunction, Improper body mechanics, Impaired flexibility, Difficulty walking, Decreased balance  Visit Diagnosis: Localized edema  Pain in left foot  Pain in right foot  Abnormal posture  Acute pain of right shoulder  Muscle weakness (generalized)     Problem List Patient Active Problem List   Diagnosis Date Noted   Anxiety 07/17/2018   [place] 07/17/2018   H/O cesarean section 07/17/2018   Evaluation by medical service required 04/02/2018   Pre-transplant evaluation for end stage renal disease 11/18/2017   Unilateral inguinal hernia 11/18/2017   Hyperlipidemia 01/22/2017   Palpitations 01/22/2017   Dyspnea  on exertion 01/22/2017   CKD (chronic kidney disease) stage 3, GFR 30-59 ml/min (HCC) 10/18/2016   Non-compliance 10/18/2016   Chronic ethmoidal sinusitis 10/18/2016   [place] C, chronic ([place] 05/15/2012   Diabetes mellitus type 2, insulin dependent ([place] 05/15/2012   Hypertension 05/15/2012   Anemia 05/15/2012   SEthel RanaDPT 08/22/21 2:21 PM  RScot Jun  PTA 08/22/2021, 1:43 PM  [place]. [place], [place], [address] Phone: [phone]   Fax:  [phone]  Name: Maureen Barnes MRN: [id number] Date of Birth: 28-Apr-1961

## 2021-08-24 ENCOUNTER — Other Ambulatory Visit: Payer: Self-pay

## 2021-08-24 ENCOUNTER — Ambulatory Visit: Payer: Medicare Other | Admitting: Physical Therapy

## 2021-08-29 ENCOUNTER — Ambulatory Visit: Payer: Medicare Other | Admitting: Physical Therapy

## 2021-09-05 ENCOUNTER — Other Ambulatory Visit: Payer: Self-pay

## 2021-09-05 ENCOUNTER — Ambulatory Visit: Payer: Medicare Other | Attending: Nurse Practitioner | Admitting: Physical Therapy

## 2021-09-05 ENCOUNTER — Encounter: Payer: Self-pay | Admitting: Physical Therapy

## 2021-09-05 DIAGNOSIS — R600 Localized edema: Secondary | ICD-10-CM | POA: Insufficient documentation

## 2021-09-05 DIAGNOSIS — R279 Unspecified lack of coordination: Secondary | ICD-10-CM | POA: Insufficient documentation

## 2021-09-05 DIAGNOSIS — M79671 Pain in right foot: Secondary | ICD-10-CM | POA: Diagnosis present

## 2021-09-05 DIAGNOSIS — R293 Abnormal posture: Secondary | ICD-10-CM | POA: Diagnosis present

## 2021-09-05 DIAGNOSIS — M79672 Pain in left foot: Secondary | ICD-10-CM | POA: Insufficient documentation

## 2021-09-05 DIAGNOSIS — R2689 Other abnormalities of gait and mobility: Secondary | ICD-10-CM | POA: Insufficient documentation

## 2021-09-05 DIAGNOSIS — M6281 Muscle weakness (generalized): Secondary | ICD-10-CM | POA: Diagnosis present

## 2021-09-05 DIAGNOSIS — M25511 Pain in right shoulder: Secondary | ICD-10-CM | POA: Diagnosis present

## 2021-09-05 NOTE — Therapy (Signed)
[place]. [place], [place], [address] Phone: [phone]   Fax:  [phone]  Physical Therapy Treatment  Patient Details  Name: Maureen Barnes MRN: [id number] Date of Birth: Oct 26, 1960 Referring Provider (PT): penny jones, NP   Encounter Date: 09/05/2021   PT End of Session - 09/05/21 1338     Visit Number 11    Date for PT Re-Evaluation 09/27/21    PT Start Time 1300    PT Stop Time 1343    PT Time Calculation (min) 43 min    Activity Tolerance Patient tolerated treatment well    Behavior During Therapy WFL for tasks assessed/performed             Past Medical History:  Diagnosis Date   Anemia    Cervical disc disease    Chronic kidney disease (CKD), stage V ([facility])    Diabetes mellitus    Hep C w/o coma, chronic ([facility])    Hypertension    Lumbar disc disease    Peritoneal dialysis status ([facility])    Renal disorder     Past Surgical History:  Procedure Laterality Date   TUBAL LIGATION      There were no vitals filed for this visit.   Subjective Assessment - 09/05/21 1301     Subjective R arm has been hurting, "past therapy and shot has wore off"    Pertinent History DDD cerv and lumb, DM, anxiety, chronic Hep C, HTN, HLD, recent PF in R foot, L foot tendonitis    Currently in Pain? No/denies                               [facility] Adult PT Treatment/Exercise - 09/05/21 0001       Shoulder Exercises: Standing   Flexion Both;Limitations;20 reps;Strengthening    Shoulder Flexion Weight (lbs) 1    ABduction Weights;20 reps;Both;Strengthening    Shoulder ABduction Weight (lbs) 1    Extension Strengthening;Both;20 reps;Weights    Theraband Level (Shoulder Extension) Level 3 (Green)    Row Strengthening;Both;20 reps;Theraband    Theraband Level (Shoulder Row) Level 3 (Green)      Shoulder Exercises: ROM/Strengthening   Nustep L3 x 6 min      Ankle Exercises: Seated    Other Seated Ankle Exercises 4 way ankle rec x 15 each      Ankle Exercises: Standing   Other Standing Ankle Exercises S2S w/ OHP 2lb 2x10    Other Standing Ankle Exercises 6 inch stpe ups x10 each      Ankle Exercises: Stretches   Gastroc Stretch 4 reps;10 seconds                       PT Short Term Goals - 08/15/21 1423       PT SHORT TERM GOAL #1   Title Pt will be independent with initial HEP    Period Weeks    Status New    Target Date 09/05/21               PT Long Term Goals - 08/22/21 1342       PT LONG TERM GOAL #1   Title I with final HEP    Status Partially Met      PT LONG TERM GOAL #3   Title Patient will perform active R shoulder elevation to at least 150 degrees with <  4/10 pain.    Status Partially Met      PT LONG TERM GOAL #4   Title Increase R shoulde strenght to at least 4-/5    Status Partially Met      PT LONG TERM GOAL #5   Title Patietn will report pain </+3/10 in feet and ankles at any point in the day.    Status On-going                   Plan - 09/05/21 1338     Clinical Impression Statement Pt enters clinic dragging her RLE stated it keeps her from putting pressure on it. During session and upon leaving pt has a normal gait. She continues to reports increase R shoulder pain. No issue with seated OHP but unable to tolerated previous weigh for shoulder flexion due to pain. Reports a good stretch with gatroc stretch.    Personal Factors and Comorbidities Comorbidity 2    Comorbidities DM with kidney transplant, cerv DDD    Examination-Activity Limitations Bathing;Reach Overhead;Carry;Lift;Hygiene/Grooming    Examination-Participation Restrictions Cleaning;Laundry;Yard Work    [employer]chant [employer] officer Evolving/Moderate complexity    Rehab Potential Good    PT Frequency 2x / week    PT Duration 6 weeks    PT Treatment/Interventions ADLs/Self Care Home Management;Iontophoresis 16m/ml Dexamethasone;Moist  Heat;Ultrasound;Cryotherapy;Electrical Stimulation;Manual techniques;Therapeutic exercise;Therapeutic activities;Functional mobility training;Patient/family education;Dry needling;Passive range of motion;Taping;Vasopneumatic Device    PT Next Visit Plan Assess Tx, progress as tolerated.             Patient will benefit from skilled therapeutic intervention in order to improve the following deficits and impairments:  Decreased range of motion, Decreased coordination, Increased fascial restricitons, Increased muscle spasms, Impaired UE functional use, Pain, Hypomobility, Decreased mobility, Decreased strength, Increased edema, Postural dysfunction, Improper body mechanics, Impaired flexibility, Difficulty walking, Decreased balance  Visit Diagnosis: Localized edema  Abnormal posture  Acute pain of right shoulder  Pain in right foot  Pain in left foot     Problem List Patient Active Problem List   Diagnosis Date Noted   Anxiety 07/17/2018   [place]RD on peritoneal dialysis ([place] 07/17/2018   H/O cesarean section 07/17/2018   Evaluation by medical service required 04/02/2018   Pre-transplant evaluation for end stage renal disease 11/18/2017   Unilateral inguinal hernia 11/18/2017   Hyperlipidemia 01/22/2017   Palpitations 01/22/2017   Dyspnea on exertion 01/22/2017   CKD (chronic kidney disease) stage 3, GFR 30-59 ml/min (HCC) 10/18/2016   Non-compliance 10/18/2016   Chronic ethmoidal sinusitis 10/18/2016   Hepatitis C, chronic ([place] 05/15/2012   Diabetes mellitus type 2, insulin dependent ([place] 05/15/2012   Hypertension 05/15/2012   Anemia 05/15/2012    RScot Jun PTA 09/05/2021, 1:41 PM  [place] [address]Phone: [phone]  Fax:  [phone] Name: Maureen RUMMELLMRN: [id number] of Birth: 116-Feb-1962

## 2021-09-07 ENCOUNTER — Encounter: Payer: Self-pay | Admitting: Physical Therapy

## 2021-09-07 ENCOUNTER — Other Ambulatory Visit: Payer: Self-pay

## 2021-09-07 ENCOUNTER — Ambulatory Visit: Payer: Medicare Other | Admitting: Physical Therapy

## 2021-09-07 DIAGNOSIS — M79671 Pain in right foot: Secondary | ICD-10-CM

## 2021-09-07 DIAGNOSIS — R600 Localized edema: Secondary | ICD-10-CM | POA: Diagnosis not present

## 2021-09-07 DIAGNOSIS — M25511 Pain in right shoulder: Secondary | ICD-10-CM

## 2021-09-07 DIAGNOSIS — R293 Abnormal posture: Secondary | ICD-10-CM

## 2021-09-07 NOTE — Therapy (Signed)
[place]. [place], [place], [address] Phone: [phone]   Fax:  [phone]  Physical Therapy Treatment  Patient Details  Name: Maureen Barnes MRN: [id number] Date of Birth: Nov 18, 1960 Referring Provider (PT): penny jones, NP   Encounter Date: 09/07/2021   PT End of Session - 09/07/21 1401     Visit Number 12    Number of Visits 20    Date for PT Re-Evaluation 09/27/21    PT Start Time 1937    PT Stop Time 1357    PT Time Calculation (min) 40 min    Activity Tolerance Patient tolerated treatment well    Behavior During Therapy WFL for tasks assessed/performed             Past Medical History:  Diagnosis Date   Anemia    Cervical disc disease    Chronic kidney disease (CKD), stage V ([place])    Diabetes mellitus    Hep C w/o coma, chronic ([place])    Hypertension    Lumbar disc disease    Peritoneal dialysis status ([place])    Renal disorder     Past Surgical History:  Procedure Laterality Date   TUBAL LIGATION      There were no vitals filed for this visit.   Subjective Assessment - 09/07/21 1318     Subjective I feel like the shoulder strengthening has been helpful but its been challenging, stretching the foot has helped out too. I believe there might be more damage in my foot than anyone realizes but it was taken a funny way bc the doctor told me he couldn't see anything. I see the doctor again in April.    Pertinent History DDD cerv and lumb, DM, anxiety, chronic Hep C, HTN, HLD, recent PF in R foot, L foot tendonitis    Patient Stated Goals Decrease pain, not having to hop or limp due to the way, improve balance.    Currently in Pain? Yes    Pain Score 5    shoulder   Pain Location --   foot is about a 7/10, shoulder is about a 5/10                              [facility] Adult PT Treatment/Exercise - 09/07/21 0001       Shoulder Exercises: Supine   Flexion  Right;Strengthening;15 reps    Shoulder Flexion Weight (lbs) 1    Other Supine Exercises chest press 2# 1x10    Other Supine Exercises serratus anterior 1x10 1#      Shoulder Exercises: Seated   Abduction Right;10 reps    ABduction Limitations 0#    Other Seated Exercises ER with towel tucked under elbow 0# 1x10      Ankle Exercises: Stretches   Plantar Fascia Stretch 3 reps;30 seconds    Gastroc Stretch 2 reps;30 seconds      Ankle Exercises: Standing   Heel Walk (Round Trip) heel walks 46f x1    Toe Walk (Round Trip) toe walks 29fx1    Other Standing Ankle Exercises SLS on blue physiodisc 3x10 seconds each LE for ankle stability                     PT Education - 09/07/21 1401     Education Details productive pain vs non-productive pain, importance of maximizing shoulder ROM and strength if surgery is  planned    Person(s) Educated Patient    Methods Explanation    Comprehension Verbalized understanding              PT Short Term Goals - 08/15/21 1423       PT SHORT TERM GOAL #1   Title Pt will be independent with initial HEP    Period Weeks    Status New    Target Date 09/05/21               PT Long Term Goals - 08/22/21 1342       PT LONG TERM GOAL #1   Title I with final HEP    Status Partially Met      PT LONG TERM GOAL #3   Title Patient will perform active R shoulder elevation to at least 150 degrees with < 4/10 pain.    Status Partially Met      PT LONG TERM GOAL #4   Title Increase R shoulde strenght to at least 4-/5    Status Partially Met      PT LONG TERM GOAL #5   Title Patietn will report pain </+3/10 in feet and ankles at any point in the day.    Status On-going                   Plan - 09/07/21 1401     Clinical Impression Statement Maureen Barnes arrives today doing OK, she is walking better coming in the door but tells me she is not walking better. Had a very detailed explanation for me about the history of her  shoulder and foot pain, very talkative and very hesitant to try progressing weights and I think would really benefit from more education on productive pain versus non-productive pain when exercising. Tried to progress her as tolerated, was a bit self-limiting and needed a lot of encouragement during session. Will continue efforts. She eagerly awaits shoulder surgery.    Personal Factors and Comorbidities Comorbidity 2    Comorbidities DM with kidney transplant, cerv DDD    Examination-Activity Limitations Bathing;Reach Overhead;Carry;Lift;Hygiene/Grooming    Examination-Participation Restrictions Cleaning;Laundry;Yard Work    [employer] officer Evolving/Moderate complexity    Clinical Decision Making Moderate    Rehab Potential Good    PT Frequency 1x / week    PT Duration 6 weeks    PT Treatment/Interventions ADLs/Self Care Home Management;Iontophoresis 37m/ml Dexamethasone;Moist Heat;Ultrasound;Cryotherapy;Electrical Stimulation;Manual techniques;Therapeutic exercise;Therapeutic activities;Functional mobility training;Patient/family education;Dry needling;Passive range of motion;Taping;Vasopneumatic Device    PT Next Visit Plan Assess Tx, progress as tolerated.    PT Home Exercise Plan Access Code: [email or web address]  URL: [email or web address]  Date: 08/17/2021  Prepared by: RCheri Fowler   Exercises  Gastroc Stretch on Wall - 1 x daily - 7 x weekly - 3 sets - 10 reps  Seated Plantar Fascia Stretch - 1 x daily - 7 x weekly - 3 sets - 10 reps    Consulted and Agree with Plan of Care Patient             Patient will benefit from skilled therapeutic intervention in order to improve the following deficits and impairments:  Decreased range of motion, Decreased coordination, Increased fascial restricitons, Increased muscle spasms, Impaired UE functional use, Pain, Hypomobility, Decreased mobility, Decreased strength, Increased edema, Postural dysfunction, Improper body  mechanics, Impaired flexibility, Difficulty walking, Decreased balance  Visit Diagnosis: Localized edema  Abnormal posture  Acute pain of right shoulder  Pain in right foot  Problem List Patient Active Problem List   Diagnosis Date Noted   Anxiety 07/17/2018   ESRD on peritoneal dialysis ([place]) 07/17/2018   H/O cesarean section 07/17/2018   Evaluation by medical service required 04/02/2018   Pre-transplant evaluation for end stage renal disease 11/18/2017   Unilateral inguinal hernia 11/18/2017   Hyperlipidemia 01/22/2017   Palpitations 01/22/2017   Dyspnea on exertion 01/22/2017   CKD (chronic kidney disease) stage 3, GFR 30-59 ml/min (HCC) 10/18/2016   Non-compliance 10/18/2016   Chronic ethmoidal sinusitis 10/18/2016   Hepatitis C, chronic ([place]) 05/15/2012   Diabetes mellitus type 2, insulin dependent ([place]) 05/15/2012   Hypertension 05/15/2012   Anemia 05/15/2012   Ann Lions PT, DPT, PN2   [facility] Therapist [place]. [place], [place], [address] Phone: [phone]   Fax:  [phone]  Name: Maureen Barnes MRN: [id number] Date of Birth: 1961/07/18

## 2021-09-12 ENCOUNTER — Ambulatory Visit: Payer: Medicare Other | Admitting: Physical Therapy

## 2021-09-12 ENCOUNTER — Other Ambulatory Visit: Payer: Self-pay

## 2021-09-12 ENCOUNTER — Encounter: Payer: Self-pay | Admitting: Physical Therapy

## 2021-09-12 DIAGNOSIS — M79672 Pain in left foot: Secondary | ICD-10-CM

## 2021-09-12 DIAGNOSIS — M79671 Pain in right foot: Secondary | ICD-10-CM

## 2021-09-12 DIAGNOSIS — R2689 Other abnormalities of gait and mobility: Secondary | ICD-10-CM

## 2021-09-12 DIAGNOSIS — M25511 Pain in right shoulder: Secondary | ICD-10-CM

## 2021-09-12 DIAGNOSIS — R600 Localized edema: Secondary | ICD-10-CM | POA: Diagnosis not present

## 2021-09-12 DIAGNOSIS — M6281 Muscle weakness (generalized): Secondary | ICD-10-CM

## 2021-09-12 DIAGNOSIS — R293 Abnormal posture: Secondary | ICD-10-CM

## 2021-09-12 DIAGNOSIS — R279 Unspecified lack of coordination: Secondary | ICD-10-CM

## 2021-09-12 NOTE — Patient Instructions (Signed)
Access Code: [email or web address] URL: [email or web address] Date: 09/12/2021 Prepared by: Ethel Rana  Exercises Gastroc Stretch on Wall - 1 x daily - 7 x weekly - 3 sets - 10 reps Seated Plantar Fascia Stretch - 1 x daily - 7 x weekly - 3 sets - 10 reps Standing Scaption Slide with Wall - 1 x daily - 7 x weekly - 2 sets - 10 reps Seated Soleus Stretch - 1 x daily - 7 x weekly - 3 sets - 15 hold Seated Toe Towel Scrunches - 1 x daily - 7 x weekly - 3 sets - 10 reps

## 2021-09-12 NOTE — Therapy (Signed)
[facility] [place], [place], [address] Phone: [phone]   Fax:  [phone]  Physical Therapy Treatment  Patient Details  Name: Maureen Barnes MRN: [id number] Date of Birth: 03-Oct-1960 Referring Provider (PT): penny jones, NP   Encounter Date: 09/12/2021   PT End of Session - 09/12/21 1402     Visit Number 13    Number of Visits 20    Date for PT Re-Evaluation 09/27/21    PT Start Time 1315    PT Stop Time 6270    PT Time Calculation (min) 38 min    Activity Tolerance Patient tolerated treatment well    Behavior During Therapy WFL for tasks assessed/performed             Past Medical History:  Diagnosis Date   Anemia    Cervical disc disease    Chronic kidney disease (CKD), stage V ([place])    Diabetes mellitus    Hep C w/o coma, chronic ([facility])    Hypertension    Lumbar disc disease    Peritoneal dialysis status ([place])    Renal disorder     Past Surgical History:  Procedure Laterality Date   TUBAL LIGATION      There were no vitals filed for this visit.   Subjective Assessment - 09/12/21 1400     Subjective patietn reports the need to cancel Th appointment as she has a Dr appt for her shoulder. She feels the weights in therapy are too heavy, causing 7-8/10 pain the evening afterwards, improves by the next afternoon.    Pertinent History DDD cerv and lumb, DM, anxiety, chronic Hep C, HTN, HLD, recent PF in R foot, L foot tendonitis    Limitations House hold activities;Lifting    How long can you sit comfortably? N/A    How long can you stand comfortably? Increased pain with longer standing.    How long can you walk comfortably? Limited due to pain afterwards.    Diagnostic tests None-referred to Orthopedic Surgeon.    Patient Stated Goals Decrease pain, not having to hop or limp due to the way, improve balance.    Currently in Pain? Yes    Pain Score 3     Pain Location Shoulder    Pain  Orientation Right    Pain Descriptors / Indicators Aching                               [facility] Adult PT Treatment/Exercise - 09/12/21 0001       Shoulder Exercises: Standing   External Rotation Strengthening;Both;20 reps;Theraband    Theraband Level (Shoulder External Rotation) Level 2 (Red)    Other Standing Exercises Wall slides for serratus-1 x 10 reps      Shoulder Exercises: ROM/Strengthening   UBE (Upper Arm Bike) L1.2, 3 min forward and back      Ankle Exercises: Stretches   Soleus Stretch 4 reps;20 seconds      Ankle Exercises: Seated   Towel Crunch 5 reps;Other (comment)   3 x 10 reps R foot.                    PT Education - 09/12/21 1402     Education Details Updated HEP. Instructed to ask Dr if further shoulder PT should be withheld until she has her shoulder surgery.    Person(s) Educated Patient    Methods  Explanation    Comprehension Verbalized understanding              PT Short Term Goals - 09/12/21 1406       PT SHORT TERM GOAL #1   Title Pt will be independent with initial HEP    Baseline Patient reports she is not fully performing, encouraged to do so.    Period Weeks    Status On-going    Target Date 09/26/21               PT Long Term Goals - 08/22/21 1342       PT LONG TERM GOAL #1   Title I with final HEP    Status Partially Met      PT LONG TERM GOAL #3   Title Patient will perform active R shoulder elevation to at least 150 degrees with < 4/10 pain.    Status Partially Met      PT LONG TERM GOAL #4   Title Increase R shoulde strenght to at least 4-/5    Status Partially Met      PT LONG TERM GOAL #5   Title Patietn will report pain </+3/10 in feet and ankles at any point in the day.    Status On-going                   Plan - 09/12/21 1319     Clinical Impression Statement Patient reports pain after exercises in therapy. she first reported that she was only performing her  stretches at home, but then said she was also performing theraband exercises. Therapist spent extensive time educating patient to rationale to continue shoulder strengthening and stretch as tolerated prior to her shoulder surgery in order to strengthen RC muscles going into the surgery. Encouraged to keep her shoulder elevation< 90 degrees. Then addressed PF, updating HEP to include Soleus stretch and intrinsic foot muscle exercises. Patient is very talkative and at times difficult to re-direct to performing exercises.    Personal Factors and Comorbidities Comorbidity 2    Comorbidities DM with kidney transplant, cerv DDD    Examination-Activity Limitations Bathing;Reach Overhead;Carry;Lift;Hygiene/Grooming    Examination-Participation Restrictions Cleaning;Laundry;Yard Work    [employer] officer Evolving/Moderate complexity    Clinical Decision Making Moderate    Rehab Potential Good    PT Frequency 1x / week    PT Duration 6 weeks    PT Treatment/Interventions ADLs/Self Care Home Management;Iontophoresis 57m/ml Dexamethasone;Moist Heat;Ultrasound;Cryotherapy;Electrical Stimulation;Manual techniques;Therapeutic exercise;Therapeutic activities;Functional mobility training;Patient/family education;Dry needling;Passive range of motion;Taping;Vasopneumatic Device    PT Next Visit Plan Assess Tx, progress as tolerated.    PT Home Exercise Plan Access Code: [email or web address]   Consulted and Agree with Plan of Care Patient             Patient will benefit from skilled therapeutic intervention in order to improve the following deficits and impairments:  Decreased range of motion, Decreased coordination, Increased fascial restricitons, Increased muscle spasms, Impaired UE functional use, Pain, Hypomobility, Decreased mobility, Decreased strength, Increased edema, Postural dysfunction, Improper body mechanics, Impaired flexibility, Difficulty walking, Decreased balance  Visit  Diagnosis: Localized edema  Abnormal posture  Acute pain of right shoulder  Pain in right foot  Pain in left foot  Muscle weakness (generalized)  Unspecified lack of coordination  Other abnormalities of gait and mobility     Problem List Patient Active Problem List   Diagnosis Date Noted   Anxiety 07/17/2018   ESRD on peritoneal dialysis ([place]  07/17/2018   H/O cesarean section 07/17/2018   Evaluation by medical service required 04/02/2018   Pre-transplant evaluation for end stage renal disease 11/18/2017   Unilateral inguinal hernia 11/18/2017   Hyperlipidemia 01/22/2017   Palpitations 01/22/2017   Dyspnea on exertion 01/22/2017   CKD (chronic kidney disease) stage 3, GFR 30-59 ml/min ([facility]) 10/18/2016   Non-compliance 10/18/2016   Chronic ethmoidal sinusitis 10/18/2016   Hepatitis C, chronic ([facility]) 05/15/2012   Diabetes mellitus type 2, insulin dependent ([place]) 05/15/2012   Hypertension 05/15/2012   Anemia 05/15/2012    Marcelina Morel, DPT 09/12/2021, 2:09 PM  [address] [place], [place], [address] Phone: [phone]   Fax:  [phone]  Name: Maureen Barnes MRN: [id number] Date of Birth: 06-26-1961

## 2021-09-14 ENCOUNTER — Ambulatory Visit: Payer: Medicare Other | Admitting: Physical Therapy

## 2021-09-19 ENCOUNTER — Ambulatory Visit: Payer: Medicare Other | Admitting: Physical Therapy

## 2021-09-19 ENCOUNTER — Other Ambulatory Visit: Payer: Self-pay

## 2021-09-19 ENCOUNTER — Encounter: Payer: Self-pay | Admitting: Physical Therapy

## 2021-09-19 DIAGNOSIS — M6281 Muscle weakness (generalized): Secondary | ICD-10-CM

## 2021-09-19 DIAGNOSIS — R600 Localized edema: Secondary | ICD-10-CM | POA: Diagnosis not present

## 2021-09-19 DIAGNOSIS — M25511 Pain in right shoulder: Secondary | ICD-10-CM

## 2021-09-19 DIAGNOSIS — M79671 Pain in right foot: Secondary | ICD-10-CM

## 2021-09-19 DIAGNOSIS — M79672 Pain in left foot: Secondary | ICD-10-CM

## 2021-09-19 NOTE — Therapy (Signed)
[facility] [address]. [place], [place], [address] Phone: [phone]   Fax:  [phone]  Physical Therapy Treatment  Patient Details  Name: Maureen Barnes MRN: [id number] Date of Birth: [birth year] Referring Provider (PT): penny jones, NP   Encounter Date: 09/19/2021   PT End of Session - 09/19/21 1351     Visit Number 14    Number of Visits 20    Date for PT Re-Evaluation 09/27/21    PT Start Time 3299    PT Stop Time 1400    PT Time Calculation (min) 42 min    Activity Tolerance Patient tolerated treatment well    Behavior During Therapy WFL for tasks assessed/performed             Past Medical History:  Diagnosis Date   Anemia    Cervical disc disease    Chronic kidney disease (CKD), stage V ([facility])    Diabetes mellitus    Hep C w/o coma, chronic ([facility])    Hypertension    Lumbar disc disease    Peritoneal dialysis status ([facility])    Renal disorder     Past Surgical History:  Procedure Laterality Date   TUBAL LIGATION      There were no vitals filed for this visit.   Subjective Assessment - 09/19/21 1320     Subjective Patient reports that she had her initial consult for shoulder surgery, but does not have a date scheduled yet. They are attempting to clarify her meds. She gave no feedback regard her "prehab". she reports not feeling well this morining. Her shoulder is ok as long as she does not lift with it. Her insulin device causes increased pain when it is applied to her R arm. No real change in her PF. She is going back to orthotist tomorrow for adjustments to her insert.    Pertinent History DDD cerv and lumb, DM, anxiety, chronic Hep C, HTN, HLD, recent PF in R foot, L foot tendonitis    Limitations House hold activities;Lifting    How long can you sit comfortably? N/A    How long can you stand comfortably? Increased pain with longer standing.    How long can you walk comfortably? Limited due to  pain afterwards.    Diagnostic tests None-referred to Orthopedic Surgeon.    Patient Stated Goals Decrease pain, not having to hop or limp due to the way, improve balance.    Currently in Pain? Yes    Pain Score 7     Pain Location Foot    Pain Orientation Right    Pain Descriptors / Indicators Aching    Pain Type Chronic pain    Pain Onset More than a month ago    Pain Frequency Constant    Aggravating Factors  standing and walking- her inserts are ineffective.                               [facility] Adult PT Treatment/Exercise - 09/19/21 0001       Shoulder Exercises: Seated   Other Seated Exercises B shoulder depression in sit iwth B hands placed behind hips, 1 x 10 reps iwth 3 sec hold.      Modalities   Modalities Vasopneumatic      Vasopneumatic   Number Minutes Vasopneumatic  15 minutes    Vasopnuematic Location  Ankle    Vasopneumatic Pressure Low  Vasopneumatic Temperature  38      Manual Therapy   Manual Therapy Soft tissue mobilization;Passive ROM    Soft tissue mobilization R calf and foot    Passive ROM R DF, inversion/eversion      Ankle Exercises: Seated   Towel Crunch 5 reps;Other (comment)   3 x 10 reps   Other Seated Ankle Exercises PF against G Tband 3 x 10 reps                       PT Short Term Goals - 09/19/21 1354       PT SHORT TERM GOAL #1   Title Pt will be independent with initial HEP    Baseline Patient reports she is not fully performing, encouraged to do so.    Period Weeks    Status Achieved    Target Date 09/26/21               PT Long Term Goals - 08/22/21 1342       PT LONG TERM GOAL #1   Title I with final HEP    Status Partially Met      PT LONG TERM GOAL #3   Title Patient will perform active R shoulder elevation to at least 150 degrees with < 4/10 pain.    Status Partially Met      PT LONG TERM GOAL #4   Title Increase R shoulde strenght to at least 4-/5    Status Partially Met       PT LONG TERM GOAL #5   Title Patietn will report pain </+3/10 in feet and ankles at any point in the day.    Status On-going                   Plan - 09/19/21 1351     Clinical Impression Statement Patient had her pre -op appointment with her shoulder surgeon, no new information to share. Treatment focused on PF of R foot as patient reports this is what is hurting the most. Therapsit performed STM followed by stretch. Patient then performed active strengthening of R foot intrinsic and extrinsic muscles F/B Vaso for iceing and compression. She reported no problems with treatment activities.    Personal Factors and Comorbidities Comorbidity 2    Comorbidities DM with kidney transplant, cerv DDD    Examination-Activity Limitations Bathing;Reach Overhead;Carry;Lift;Hygiene/Grooming    Examination-Participation Restrictions Cleaning;Laundry;Yard Work    [employer] officer Evolving/Moderate complexity    Clinical Decision Making Moderate    Rehab Potential Good    PT Frequency 1x / week    PT Duration 4 weeks    PT Treatment/Interventions ADLs/Self Care Home Management;Iontophoresis 29m/ml Dexamethasone;Moist Heat;Ultrasound;Cryotherapy;Electrical Stimulation;Manual techniques;Therapeutic exercise;Therapeutic activities;Functional mobility training;Patient/family education;Dry needling;Passive range of motion;Taping;Vasopneumatic Device    PT Next Visit Plan Assess Tx, progress as tolerated.    PT Home Exercise Plan Access Code: [email or web address]    Consulted and Agree with Plan of Care Patient             Patient will benefit from skilled therapeutic intervention in order to improve the following deficits and impairments:  Decreased range of motion, Decreased coordination, Increased fascial restricitons, Increased muscle spasms, Impaired UE functional use, Pain, Hypomobility, Decreased mobility, Decreased strength, Increased edema, Postural dysfunction, Improper body  mechanics, Impaired flexibility, Difficulty walking, Decreased balance  Visit Diagnosis: Localized edema  Acute pain of right shoulder  Pain in right foot  Pain in left foot  Muscle  weakness (generalized)     Problem List Patient Active Problem List   Diagnosis Date Noted   Anxiety 07/17/2018   ESRD on peritoneal dialysis ([facility]) 07/17/2018   H/O cesarean section 07/17/2018   Evaluation by medical service required 04/02/2018   Pre-transplant evaluation for end stage renal disease 11/18/2017   Unilateral inguinal hernia 11/18/2017   Hyperlipidemia 01/22/2017   Palpitations 01/22/2017   Dyspnea on exertion 01/22/2017   CKD (chronic kidney disease) stage 3, GFR 30-59 ml/min (HCC) 10/18/2016   Non-compliance 10/18/2016   Chronic ethmoidal sinusitis 10/18/2016   Hepatitis C, chronic ([place]) 05/15/2012   Diabetes mellitus type 2, insulin dependent ([facility]) 05/15/2012   Hypertension 05/15/2012   Anemia 05/15/2012    Marcelina Morel, PT 09/19/2021, 1:56 PM  [facility] [address]. [place], [place], [address] Phone: [phone]   Fax:  [phone]  Name: Maureen Barnes MRN: [id number] Date of Birth: [birth year]

## 2021-09-21 ENCOUNTER — Ambulatory Visit: Payer: Medicare Other | Admitting: Physical Therapy

## 2021-10-27 ENCOUNTER — Ambulatory Visit: Payer: Medicare Other | Admitting: Internal Medicine

## 2021-11-02 LAB — LIPID PANEL
Chol/HDL Ratio: 3 ratio (ref 0.0–4.4)
Cholesterol, Total: 186 mg/dL (ref 100–199)
HDL: 62 mg/dL (ref >39)
LDL Chol Calc (NIH): 109 mg/dL — ABNORMAL HIGH (ref 0–99)
Triglycerides: 84 mg/dL (ref 0–149)
VLDL Cholesterol Cal: 15 mg/dL (ref 5–40)

## 2021-11-09 ENCOUNTER — Encounter: Payer: Self-pay | Admitting: Internal Medicine

## 2021-11-09 ENCOUNTER — Ambulatory Visit (INDEPENDENT_AMBULATORY_CARE_PROVIDER_SITE_OTHER): Payer: Medicare Other | Admitting: Internal Medicine

## 2021-11-09 ENCOUNTER — Ambulatory Visit: Payer: Medicare Other | Admitting: Internal Medicine

## 2021-11-09 VITALS — BP 117/74 | HR 90 | Ht 63.0 in | Wt 181.6 lb

## 2021-11-09 DIAGNOSIS — E119 Type 2 diabetes mellitus without complications: Secondary | ICD-10-CM | POA: Diagnosis not present

## 2021-11-09 DIAGNOSIS — E785 Hyperlipidemia, unspecified: Secondary | ICD-10-CM

## 2021-11-09 DIAGNOSIS — Z940 Kidney transplant status: Secondary | ICD-10-CM

## 2021-11-09 NOTE — Patient Instructions (Signed)
Medication Instructions:  ?RESUME zetia  ? ?*If you need a refill on your cardiac medications before your next appointment, please call your pharmacy* ? ? ?Lab Work: ?FASTING lab work in 4-5 months to check cholesterol  ? ?If you have labs (blood work) drawn today and your tests are completely normal, you will receive your results only by: ?MyChart Message (if you have MyChart) OR ?A paper copy in the mail ?If you have any lab test that is abnormal or we need to change your treatment, we will call you to review the results. ? ?Follow-Up: ?At [facility], you and your health needs are our priority.  As part of our continuing mission to provide you with exceptional heart care, we have created designated Provider Care Teams.  These Care Teams include your primary Cardiologist (physician) and Advanced Practice Providers (APPs -  Physician Assistants and Nurse Practitioners) who all work together to provide you with the care you need, when you need it. ? ?We recommend signing up for the patient portal called "MyChart".  Sign up information is provided on this After Visit Summary.  MyChart is used to connect with patients for Virtual Visits (Telemedicine).  Patients are able to view lab/test results, encounter notes, upcoming appointments, etc.  Non-urgent messages can be sent to your provider as well.   ?To learn more about what you can do with MyChart, go to [email or web address].   ? ?Your next appointment:   ?4-5 months with Dr. Debara Pickett -- lipid clinic ?

## 2021-11-09 NOTE — Progress Notes (Signed)
? ? ?LIPID CLINIC CONSULT NOTE ? ?Chief Complaint:  ?Follow-up dyslipidemia ? ?Primary Care Physician: ?Berkley Harvey, NP ? ?Primary Cardiologist:  ?Quay Burow, MD ? ?HPI:  ?Maureen Barnes is a 61 y.o. female who is being seen today for the evaluation of dyslipidemia at the request of Berkley Harvey, NP. this is a pleasant 61 year old female kindly referred by Dr. Gwenlyn Found for evaluation management of dyslipidemia.  She has a history of type 2 diabetes, chronic hepatitis C, hypertension and end-stage renal disease likely secondary to these risk factors and is status post renal transplant in 2020.  She is immunosuppressed.  I think she is known to have any coronary disease however has had elevated cholesterol.  Particularly her triglycerides have been high in the past.  This could be driven by immunosuppressive medications as well.  Unfortunately she cannot come off these medicines.  Her LDL when she was seen by Dr. Gwenlyn Found was 116 however recently repeated labs showed total cholesterol 167, triglycerides 137, HDL 49 and LDL of 96.  She is on 20 mg of atorvastatin but she reported after increasing atorvastatin from 20 to 40 mg at night that she had side effects.  Ideally, would like to target her LDL less than 70 given diabetes with complications. ? ?11/09/2021 ? ?Maureen Barnes returns today for follow-up of her dyslipidemia.  When I last saw her I recommended starting ezetimibe 10 mg daily in addition to low-dose atorvastatin.  This is because she reported she cannot take increased doses of statin.  Unfortunately, she never started the ezetimibe due to concerns about GI side effects and she has underlying gastroparesis.  Cholesterol has increased since her last visit now total 186, triglycerides 84, HDL 62 and LDL 109 (up from 96). ? ?PMHx:  ?Past Medical History:  ?Diagnosis Date  ? Anemia   ? Cervical disc disease   ? Chronic kidney disease (CKD), stage V ([place] [place])   ? Diabetes mellitus   ? Hep C w/o coma, chronic  ([facility])   ? Hypertension   ? Lumbar disc disease   ? Peritoneal dialysis status ([place])   ? Renal disorder   ? ? ?Past Surgical History:  ?Procedure Laterality Date  ? TUBAL LIGATION    ? ? ?FAMHx:  ?Family History  ?Problem Relation Age of Onset  ? Hypertension Mother   ? Cancer Mother   ? Breast cancer Mother   ? Diabetes Father   ? Hypertension Father   ? ? ?SOCHx:  ? reports that she has never smoked. She has never used smokeless tobacco. She reports that she does not drink alcohol and does not use drugs. ? ?ALLERGIES:  ?Allergies  ?Allergen Reactions  ? Peanut Oil Anaphylaxis, Itching and Swelling  ?  Tree nuts  ? Shellfish-Derived Products   ?  Lips and thraot swell  ? Iodine Itching and Swelling  ?  Shellfish-lips swell itching.  ? Peanut-Containing Drug Products Itching and Swelling  ? Shellfish Allergy Itching and Swelling  ? ? ?ROS: ?Pertinent items noted in HPI and remainder of comprehensive ROS otherwise negative. ? ?HOME MEDS: ?Current Outpatient Medications on File Prior to Visit  ?Medication Sig Dispense Refill  ? amLODipine (NORVASC) 5 MG tablet Take 5 mg by mouth daily.    ? aspirin EC 81 MG tablet Take 81 mg by mouth daily.    ? atorvastatin (LIPITOR) 20 MG tablet Take 1 tablet by mouth at bedtime.    ? b complex-vitamin c-folic acid (NEPHRO-VITE) 0.8  MG TABS tablet Take 1 tablet by mouth daily.  6  ? chlorthalidone (HYGROTON) 25 MG tablet Take 12.5 mg by mouth daily.    ? ciclopirox (PENLAC) 8 % solution Apply topically at bedtime. Apply over nail and surrounding skin. Apply daily over previous coat. After seven (7) days, may remove with alcohol and continue cycle. 6.6 mL 4  ? Continuous Blood Gluc Receiver (DEXCOM G6 RECEIVER) DEVI Use as directed for continuous glucose monitoring.    ? Continuous Blood Gluc Sensor (DEXCOM G6 SENSOR) MISC Inject 1 sensor to the skin every 10 days for continuous glucose monitoring.    ? Continuous Blood Gluc Transmit (DEXCOM G6 TRANSMITTER) MISC Use as directed for  continuous glucose monitoring. Reuse transmitter for 90 days then discard and replace.    ? diphenhydrAMINE (BENADRYL) 25 MG tablet Take 1 tablet (25 mg total) by mouth at bedtime as needed. 30 tablet 0  ? docusate sodium (COLACE) 100 MG capsule Take 100 mg by mouth 2 (two) times daily.    ? ENVARSUS XR 1 MG TB24 Take 1 tablet by mouth daily as needed.    ? ENVARSUS XR 4 MG TB24 Take 2 tablets by mouth daily.    ? famotidine (PEPCID) 10 MG tablet Take 10 mg by mouth 2 (two) times daily.    ? fluticasone (FLONASE) 50 MCG/ACT nasal spray Place 2 sprays into both nostrils daily.    ? HUMALOG 100 UNIT/ML injection 100 Units.    ? insulin glargine (LANTUS) 100 UNIT/ML injection Inject 20 Units into the skin daily. As Needed    ? loratadine (CLARITIN) 10 MG tablet Take 10 mg by mouth as directed.    ? magnesium oxide (MAG-OX) 400 MG tablet Take 2 tablets by mouth 2 (two) times daily.    ? methocarbamol (ROBAXIN) 750 MG tablet Take by mouth.    ? metoprolol succinate (TOPROL-XL) 50 MG 24 hr tablet Take by mouth 50 mg in the mornings and 25 mg (half tablet) in the evenings 270 tablet 1  ? mycophenolate (MYFORTIC) 180 MG EC tablet Take 180 mg by mouth in the morning, at noon, and at bedtime.    ? omeprazole (PRILOSEC) 40 MG capsule Take 20 mg by mouth daily. Takes 20 MG daily    ? ondansetron (ZOFRAN) 4 MG tablet Take 1 tablet (4 mg total) by mouth every 8 (eight) hours as needed for nausea or vomiting. 45 tablet 1  ? predniSONE (DELTASONE) 5 MG tablet Take 5 mg by mouth daily.    ? Probiotic Product (PROBIOTIC PEARLS ADVANTAGE PO) Take by mouth.    ? simethicone (MYLICON) 80 MG chewable tablet Chew 80 mg by mouth every 6 (six) hours as needed for flatulence.    ? sulfamethoxazole-trimethoprim (BACTRIM DS) 800-160 MG tablet 1 tablet    ? tiZANidine (ZANAFLEX) 2 MG tablet Take by mouth.    ? TYLENOL 500 MG tablet Take 1,000 mg by mouth 3 (three) times daily as needed.    ? ezetimibe (ZETIA) 10 MG tablet Take 1 tablet (10 mg  total) by mouth daily. (Patient not taking: Reported on 11/09/2021) 90 tablet 3  ? ?No current facility-administered medications on file prior to visit.  ? ? ?LABS/IMAGING: ?No results found for this or any previous visit (from the past 48 hour(s)). ?No results found. ? ?LIPID PANEL: ?   ?Component Value Date/Time  ? CHOL 186 11/01/2021 0826  ? TRIG 84 11/01/2021 0826  ? HDL 62 11/01/2021 0826  ? CHOLHDL  3.0 11/01/2021 0826  ? LDLCALC 109 (H) 11/01/2021 2836  ? ? ?WEIGHTS: ?Wt Readings from Last 3 Encounters:  ?11/09/21 181 lb 9.6 oz (82.4 kg)  ?07/06/21 175 lb (79.4 kg)  ?03/03/21 176 lb 9.6 oz (80.1 kg)  ? ? ?VITALS: ?BP 117/74   Pulse 90   Ht '5\' 3"'$  (1.6 m)   Wt 181 lb 9.6 oz (82.4 kg)   LMP 07/10/2011   SpO2 99%   BMI 32.17 kg/m?  ? ?EXAM: ?Deferred ? ?EKG: ?Deferred ? ?ASSESSMENT: ?Mixed dyslipidemia, goal LDL less than 70 ?Type 2 diabetes with complications ?Hypertension ?End-stage renal disease status post renal transplant-immunosuppressed ?History of palpitations ? ?PLAN: ?1.   Maureen Barnes has actually had increase in her cholesterol since I last saw her.  She remains on atorvastatin but did not start ezetimibe because of concern about side effects.  We discussed those at more length today.  She understands while she could have some GI side effects, is generally pretty well-tolerated.  I advised her to try it and if she has side effects reach out to [place] as they are always typically reversible.  There is also been some intermittent weight gain as well.  She needs to work more aggressively on diet and weight loss.  This will help her cholesterol as well.  Plan follow-up with me in about 3 to 4 months with a repeat lipid profile. ? ?Pixie Casino, MD, [facility], FACP  ?[place]  ?Medical Director of the Advanced Lipid Disorders &  ?Cardiovascular Risk Reduction Clinic ?Diplomate of the [employer] of Clinical Lipidology ?Attending Cardiologist  ?Direct Dial: [phone]  Fax:  [phone]  ?Website:  [email or web address] ? ?Maureen Barnes ?11/09/2021, 11:03 AM  ? ?

## 2021-12-25 ENCOUNTER — Other Ambulatory Visit: Payer: Self-pay | Admitting: Nurse Practitioner

## 2021-12-25 DIAGNOSIS — Z1231 Encounter for screening mammogram for malignant neoplasm of breast: Secondary | ICD-10-CM

## 2021-12-26 ENCOUNTER — Ambulatory Visit
Admission: RE | Admit: 2021-12-26 | Discharge: 2021-12-26 | Disposition: A | Discharge status: Home / Self Care | Payer: Medicare Other | Source: Ambulatory Visit | Attending: Nurse Practitioner | Admitting: Nurse Practitioner

## 2021-12-26 DIAGNOSIS — Z1231 Encounter for screening mammogram for malignant neoplasm of breast: Secondary | ICD-10-CM

## 2022-02-21 NOTE — Therapy (Signed)
OUTPATIENT PHYSICAL THERAPY SHOULDER EVALUATION   Patient Name: Maureen Barnes MRN: [id number] DOB:Feb 25, 1961, 62 y.o., female Today's Date: 02/21/2022    Past Medical History:  Diagnosis Date   Anemia    Cervical disc disease    Chronic kidney disease (CKD), stage V ([place])    Diabetes mellitus    Hep C w/o coma, chronic ([place])    Hypertension    Lumbar disc disease    Peritoneal dialysis status ([place])    Renal disorder    Past Surgical History:  Procedure Laterality Date   TUBAL LIGATION     Patient Active Problem List   Diagnosis Date Noted   Anxiety 07/17/2018   ESRD on peritoneal dialysis ([place]) 07/17/2018   H/O cesarean section 07/17/2018   Evaluation by medical service required 04/02/2018   Pre-transplant evaluation for end stage renal disease 11/18/2017   Unilateral inguinal hernia 11/18/2017   Hyperlipidemia 01/22/2017   Palpitations 01/22/2017   Dyspnea on exertion 01/22/2017   CKD (chronic kidney disease) stage 3, GFR 30-59 ml/min (HCC) 10/18/2016   Non-compliance 10/18/2016   Chronic ethmoidal sinusitis 10/18/2016   Hepatitis C, chronic ([place]) 05/15/2012   Diabetes mellitus type 2, insulin dependent ([place]) 05/15/2012   Hypertension 05/15/2012   Anemia 05/15/2012    PCP: Eldridge Abrahams  REFERRING PROVIDER: Earlie Server  REFERRING DIAG: s/p shoulder scope  THERAPY DIAG:  No diagnosis found.  Rationale for Evaluation and Treatment Rehabilitation  ONSET DATE: 10/18/21  SUBJECTIVE:                                                                                                                                                                                      SUBJECTIVE STATEMENT: Patient states she had to pull over on her way here because her sugar was running low and that is why she is late today. "A couple of weeks ago when I was in therapy, I was pulling the blue band and moving weight over my head. After I got outside, I felt a little neck pain and  it went all down my R shoulder. I think it inflammed. I have been doing therapy at home with heat and ice and taking tylenol and muscle relaxer and stretching."  PERTINENT HISTORY: R shoulder scope, DM, HTN, CKD  PAIN:  Are you having pain? Yes: NPRS scale: 4/10 Pain location: R shoulder and elbow Pain description: sore Aggravating factors: opening doors, jars, bottles, reaching up, lifting objects Relieving factors: ice, heat, stretching, medications  PRECAUTIONS: None  WEIGHT BEARING RESTRICTIONS No  FALLS:  Has patient fallen in last 6 months? No  LIVING ENVIRONMENT: Lives with: lives alone Lives in: House/apartment Stairs:  Yes: Internal: 14 steps; can reach both Has following equipment at home: Single point cane, Walker - 2 wheeled, and bed side commode  OCCUPATION: On disability   PLOF: Independent  PATIENT GOALS to be able to reach overhead, have no more pain   OBJECTIVE:   DIAGNOSTIC FINDINGS:  IMPRESSION: Calcific tendinosis of the distal supraspinatus and infraspinatus tendons with compact 2.2 cm calcific deposit along the bursal surface at the footprint and associated subacromial-subdeltoid bursitis. There is bursal sided fraying and probable low-grade tearing of the distal supraspinatus infraspinatus tendons at the footprint.   Mild AC joint arthropathy.  PATIENT SURVEYS:  FOTO 48/100  COGNITION:  Overall cognitive status: Within functional limits for tasks assessed     SENSATION: WFL  POSTURE: Rounded head, forward head   UPPER EXTREMITY ROM:   Active ROM Right eval Left eval  Shoulder flexion 120   Shoulder extension    Shoulder abduction 90 w/pain   Shoulder adduction    Shoulder internal rotation WFL pain at end range   Shoulder external rotation 25   Elbow flexion WNL   Elbow extension WNL   Wrist flexion    Wrist extension    Wrist ulnar deviation    Wrist radial deviation    Wrist pronation    Wrist supination    (Blank  rows = not tested)  UPPER EXTREMITY MMT: pain with all on testing R side   MMT Right eval Left eval  Shoulder flexion 2- 3  Shoulder extension    Shoulder abduction 2- 3  Shoulder adduction    Shoulder internal rotation 2   Shoulder external rotation 2    Middle trapezius    Lower trapezius    Elbow flexion 4   Elbow extension 4    Wrist flexion    Wrist extension    Wrist ulnar deviation    Wrist radial deviation    Wrist pronation    Wrist supination    Grip strength (lbs)    (Blank rows = not tested)  SHOULDER SPECIAL TESTS:  Impingement tests: Neer impingement test: positive , Hawkins/Kennedy impingement test: positive , and Painful arc test: positive   Rotator cuff assessment: Empty can test: positive   Biceps assessment: Speed's test: positive   JOINT MOBILITY TESTING:  Sharp pain w/abd at 90d, increased muscle guarding with all PROM   PALPATION:  TTP R biceps, tight upper trap   TODAY'S TREATMENT:  Eval and POC   PATIENT EDUCATION: Education details: POC Person educated: Patient Education method: Explanation Education comprehension: verbalized understanding   HOME EXERCISE PROGRAM: TBD  ASSESSMENT:  CLINICAL IMPRESSION: Patient is a 61 y.o. female who was seen today for physical therapy evaluation and treatment for R shoulder pain following a scope in March. She presents with limited range of motion and high pain levels with movements. Patient also has significant muscle weakness in R shoulder. She will benefit from skilled PT intervention to address R shoulder limitations to be able to complete ADLs without pain and difficulty.   OBJECTIVE IMPAIRMENTS decreased ROM, decreased strength, impaired UE functional use, postural dysfunction, and pain.   ACTIVITY LIMITATIONS carrying, lifting, dressing, reach over head, and hygiene/grooming  PARTICIPATION LIMITATIONS: cleaning, laundry, shopping, and yard work  [employer] POTENTIAL: Good  CLINICAL DECISION  MAKING: Stable/uncomplicated  EVALUATION COMPLEXITY: Low   GOALS: Goals reviewed with patient? Yes  SHORT TERM GOALS: Target date: 03/29/22  Patient will be independent with initial HEP.  Goal status: INITIAL   LONG TERM  GOALS: Target date: 04/26/22  Patient will be independent with advanced/ongoing HEP to improve outcomes and carryover.  Goal status: INITIAL  2.  Patient will report <2/10 at worst in R shoulder pain to improve QOL.  Goal status: INITIAL  3.  Patient to demonstrate improved upright posture with posterior shoulder girdle engaged to promote improved glenohumeral joint mobility. Goal status: INITIAL  4.  Patient to improve R shoulder AROM to [facility] without pain provocation to allow for increased ease with ADLs.  Goal status: INITIAL  5.  Patient will demonstrate improved functional UE strength by 1 grade in all weak muscle groups.  Goal status: INITIAL  6  Patient will report 70 on FOTO to demonstrate improved functional ability.  Baseline: 48 Goal status: INITIAL  PLAN: PT FREQUENCY: 1-2x/week  PT DURATION: 8 weeks  PLANNED INTERVENTIONS: Therapeutic exercises, Therapeutic activity, Neuromuscular re-education, Balance training, Gait training, Patient/Family education, Self Care, Joint mobilization, Dry Needling, Electrical stimulation, Cryotherapy, Moist heat, Taping, Vasopneumatic device, Ultrasound, Ionotophoresis '4mg'$ /ml Dexamethasone, and Manual therapy  PLAN FOR NEXT SESSION: PROM, AAROM as tolerated, Vaso for pain management    Andris Baumann, PT 02/21/2022, 4:12 PM

## 2022-02-22 ENCOUNTER — Ambulatory Visit: Payer: Medicare Other | Attending: Orthopedic Surgery

## 2022-02-22 DIAGNOSIS — R293 Abnormal posture: Secondary | ICD-10-CM | POA: Insufficient documentation

## 2022-02-22 DIAGNOSIS — M25611 Stiffness of right shoulder, not elsewhere classified: Secondary | ICD-10-CM | POA: Diagnosis present

## 2022-02-22 DIAGNOSIS — R600 Localized edema: Secondary | ICD-10-CM | POA: Insufficient documentation

## 2022-02-22 DIAGNOSIS — M6281 Muscle weakness (generalized): Secondary | ICD-10-CM | POA: Insufficient documentation

## 2022-02-22 DIAGNOSIS — M25511 Pain in right shoulder: Secondary | ICD-10-CM | POA: Insufficient documentation

## 2022-02-22 DIAGNOSIS — R279 Unspecified lack of coordination: Secondary | ICD-10-CM | POA: Insufficient documentation

## 2022-02-26 ENCOUNTER — Other Ambulatory Visit: Payer: Self-pay | Admitting: Orthopedic Surgery

## 2022-02-26 ENCOUNTER — Ambulatory Visit: Payer: Medicare Other | Admitting: Physical Therapy

## 2022-02-26 ENCOUNTER — Encounter: Payer: Self-pay | Admitting: Physical Therapy

## 2022-02-26 DIAGNOSIS — R600 Localized edema: Secondary | ICD-10-CM

## 2022-02-26 DIAGNOSIS — M25511 Pain in right shoulder: Secondary | ICD-10-CM

## 2022-02-26 DIAGNOSIS — M6281 Muscle weakness (generalized): Secondary | ICD-10-CM

## 2022-02-26 DIAGNOSIS — M5412 Radiculopathy, cervical region: Secondary | ICD-10-CM

## 2022-02-26 DIAGNOSIS — R293 Abnormal posture: Secondary | ICD-10-CM

## 2022-02-26 DIAGNOSIS — M25611 Stiffness of right shoulder, not elsewhere classified: Secondary | ICD-10-CM

## 2022-02-26 DIAGNOSIS — R279 Unspecified lack of coordination: Secondary | ICD-10-CM

## 2022-02-26 NOTE — Therapy (Signed)
OUTPATIENT PHYSICAL THERAPY SHOULDER EVALUATION   Patient Name: Maureen Barnes MRN: [id number] DOB:[birth year], 61 y.o., female Today's Date: 02/26/2022   PT End of Session - 02/26/22 1056     Visit Number 2    Date for PT Re-Evaluation 04/26/22    PT Start Time 1057    PT Stop Time 1143    PT Time Calculation (min) 46 min    Activity Tolerance Patient tolerated treatment well    Behavior During Therapy [facility] for tasks assessed/performed             Past Medical History:  Diagnosis Date   Anemia    Cervical disc disease    Chronic kidney disease (CKD), stage V ([place])    Diabetes mellitus    Hep C w/o coma, chronic ([place])    Hypertension    Lumbar disc disease    Peritoneal dialysis status ([place])    Renal disorder    Past Surgical History:  Procedure Laterality Date   TUBAL LIGATION     Patient Active Problem List   Diagnosis Date Noted   Anxiety 07/17/2018   ESRD on peritoneal dialysis ([place]) 07/17/2018   H/O cesarean section 07/17/2018   Evaluation by medical service required 04/02/2018   Pre-transplant evaluation for end stage renal disease 11/18/2017   Unilateral inguinal hernia 11/18/2017   Hyperlipidemia 01/22/2017   Palpitations 01/22/2017   Dyspnea on exertion 01/22/2017   CKD (chronic kidney disease) stage 3, GFR 30-59 ml/min ([place]) 10/18/2016   Non-compliance 10/18/2016   Chronic ethmoidal sinusitis 10/18/2016   Hepatitis C, chronic ([place]) 05/15/2012   Diabetes mellitus type 2, insulin dependent ([place]) 05/15/2012   Hypertension 05/15/2012   Anemia 05/15/2012    PCP: Eldridge Abrahams  REFERRING PROVIDER: Earlie Server  REFERRING DIAG: s/p shoulder scope  THERAPY DIAG:  Acute pain of right shoulder  Stiffness of right shoulder, not elsewhere classified  Muscle weakness (generalized)  Localized edema  Unspecified lack of coordination  Abnormal posture  Rationale for Evaluation and Treatment Rehabilitation  ONSET DATE: 10/18/21  SUBJECTIVE:                                                                                                                                                                                       SUBJECTIVE STATEMENT: Patient reports she is having a rough day with allover pain. Her shoulder is about the same, some mild pain, painful when trying to lift things, stiff. She saw the Dr today for her neck, has a referred for an MRI for her neck after seeing Dr French Ana today. He would like to ensure she works on postural strength in PT.  PERTINENT  HISTORY: R shoulder scope, DM, HTN, CKD  PAIN:  Are you having pain? Yes: NPRS scale: 4/10 Pain location: R shoulder and elbow Pain description: sore Aggravating factors: opening doors, jars, bottles, reaching up, lifting objects Relieving factors: ice, heat, stretching, medications  PRECAUTIONS: None  WEIGHT BEARING RESTRICTIONS No  FALLS:  Has patient fallen in last 6 months? No  LIVING ENVIRONMENT: Lives with: lives alone Lives in: House/apartment Stairs: Yes: Internal: 14 steps; can reach both Has following equipment at home: Single point cane, Walker - 2 wheeled, and bed side commode  OCCUPATION: On disability   PLOF: Independent  PATIENT GOALS to be able to reach overhead, have no more pain   OBJECTIVE:   DIAGNOSTIC FINDINGS:  IMPRESSION: Calcific tendinosis of the distal supraspinatus and infraspinatus tendons with compact 2.2 cm calcific deposit along the bursal surface at the footprint and associated subacromial-subdeltoid bursitis. There is bursal sided fraying and probable low-grade tearing of the distal supraspinatus infraspinatus tendons at the footprint.   Mild AC joint arthropathy.  PATIENT SURVEYS:  FOTO 48/100  COGNITION:  Overall cognitive status: Within functional limits for tasks assessed     SENSATION: WFL  POSTURE: Rounded head, forward head   UPPER EXTREMITY ROM:   Active ROM Right eval Left eval  Shoulder  flexion 120   Shoulder extension    Shoulder abduction 90 w/pain   Shoulder adduction    Shoulder internal rotation WFL pain at end range   Shoulder external rotation 25   Elbow flexion WNL   Elbow extension WNL   Wrist flexion    Wrist extension    Wrist ulnar deviation    Wrist radial deviation    Wrist pronation    Wrist supination    (Blank rows = not tested)  UPPER EXTREMITY MMT: pain with all on testing R side   MMT Right eval Left eval  Shoulder flexion 2- 3  Shoulder extension    Shoulder abduction 2- 3  Shoulder adduction    Shoulder internal rotation 2   Shoulder external rotation 2    Middle trapezius    Lower trapezius    Elbow flexion 4   Elbow extension 4    Wrist flexion    Wrist extension    Wrist ulnar deviation    Wrist radial deviation    Wrist pronation    Wrist supination    Grip strength (lbs)    (Blank rows = not tested)  SHOULDER SPECIAL TESTS:  Impingement tests: Neer impingement test: positive , Hawkins/Kennedy impingement test: positive , and Painful arc test: positive   Rotator cuff assessment: Empty can test: positive   Biceps assessment: Speed's test: positive   JOINT MOBILITY TESTING:  Sharp pain w/abd at 90d, increased muscle guarding with all PROM   PALPATION:  TTP R biceps, tight upper trap   TODAY'S TREATMENT:  02/26/22 NuStep with arms L3 x 6 minutes AAROM with dowell into shoulder flexion, abd, IR/ER 10 each. Standing shouler ext, Rows, ER against Red Tband resistance 2 x 10 reps of each.  Eval and POC   PATIENT EDUCATION: Education details: POC Person educated: Patient Education method: Explanation Education comprehension: verbalized understanding   HOME EXERCISE PROGRAM: TBD  ASSESSMENT:  CLINICAL IMPRESSION: Patient is a 61 y.o. female who was seen today for physical therapy evaluation and treatment for R shoulder pain following a scope in March. She presents with limited range of motion and high pain  levels with movements. Patient also has significant muscle  weakness in R shoulder. She will benefit from skilled PT intervention to address R shoulder limitations to be able to complete ADLs without pain and difficulty.   OBJECTIVE IMPAIRMENTS decreased ROM, decreased strength, impaired UE functional use, postural dysfunction, and pain.   ACTIVITY LIMITATIONS carrying, lifting, dressing, reach over head, and hygiene/grooming  PARTICIPATION LIMITATIONS: cleaning, laundry, shopping, and yard work  [employer] POTENTIAL: Good  CLINICAL DECISION MAKING: Stable/uncomplicated  EVALUATION COMPLEXITY: Low   GOALS: Goals reviewed with patient? Yes  SHORT TERM GOALS: Target date: 03/29/22  Patient will be independent with initial HEP.  Goal status: INITIAL   LONG TERM GOALS: Target date: 04/26/22  Patient will be independent with advanced/ongoing HEP to improve outcomes and carryover.  Goal status: INITIAL  2.  Patient will report <2/10 at worst in R shoulder pain to improve QOL.  Goal status: INITIAL  3.  Patient to demonstrate improved upright posture with posterior shoulder girdle engaged to promote improved glenohumeral joint mobility. Goal status: INITIAL  4.  Patient to improve R shoulder AROM to [facility] without pain provocation to allow for increased ease with ADLs.  Goal status: INITIAL  5.  Patient will demonstrate improved functional UE strength by 1 grade in all weak muscle groups.  Goal status: INITIAL  6  Patient will report 64 on FOTO to demonstrate improved functional ability.  Baseline: 48 Goal status: INITIAL  PLAN: PT FREQUENCY: 1-2x/week  PT DURATION: 8 weeks  PLANNED INTERVENTIONS: Therapeutic exercises, Therapeutic activity, Neuromuscular re-education, Balance training, Gait training, Patient/Family education, Self Care, Joint mobilization, Dry Needling, Electrical stimulation, Cryotherapy, Moist heat, Taping, Vasopneumatic device, Ultrasound, Ionotophoresis '4mg'$ /ml  Dexamethasone, and Manual therapy  PLAN FOR NEXT SESSION: PROM, AAROM as tolerated, Vaso for pain management    Marcelina Morel, DPT 02/26/2022, 11:45 AM

## 2022-02-28 ENCOUNTER — Telehealth: Payer: Self-pay | Admitting: Cardiovascular Disease

## 2022-02-28 NOTE — Telephone Encounter (Signed)
Patient reports palpitations on and off. She does not consistently take ASA due to GI upset. Current BP 127/79, P 88. She denies chest pain and dizziness. She gets sob. Not sob over phone. Has a headache and took Tylenol. Wants to be seen. Appointment made with Janan Ridge, PA-C for 03/06/22. Recommended to patient that if she has palpitations with sob or dizziness, she needs to have someone take her to the ED. She verbalized understanding.

## 2022-02-28 NOTE — Telephone Encounter (Signed)
Patient c/o Palpitations:  High priority if patient c/o lightheadedness, shortness of breath, or chest pain  How long have you had palpitations/irregular HR/ Afib? Are you having the symptoms now?   Yes  Are you currently experiencing lightheadedness, SOB or CP? No  Do you have a history of afib (atrial fibrillation) or irregular heart rhythm?   Yes  Have you checked your BP or HR? (document readings if available):     BP    127/79    HR  88  Are you experiencing any other symptoms?   Onset of a headache   Patient called stating she is concerned that she is having palpitations and wants a sooner appointment.

## 2022-03-05 ENCOUNTER — Ambulatory Visit: Payer: Medicare Other

## 2022-03-05 DIAGNOSIS — M25511 Pain in right shoulder: Secondary | ICD-10-CM

## 2022-03-05 DIAGNOSIS — R600 Localized edema: Secondary | ICD-10-CM

## 2022-03-05 DIAGNOSIS — M25611 Stiffness of right shoulder, not elsewhere classified: Secondary | ICD-10-CM

## 2022-03-05 DIAGNOSIS — R279 Unspecified lack of coordination: Secondary | ICD-10-CM

## 2022-03-05 DIAGNOSIS — M6281 Muscle weakness (generalized): Secondary | ICD-10-CM

## 2022-03-05 DIAGNOSIS — R293 Abnormal posture: Secondary | ICD-10-CM

## 2022-03-05 NOTE — Therapy (Signed)
OUTPATIENT PHYSICAL THERAPY SHOULDER TREATMENT   Patient Name: Maureen Barnes MRN: [id number] DOB:Aug 16, 1960, 61 y.o., female Today's Date: 03/05/2022   PT End of Session - 03/05/22 1315     Visit Number 3    Date for PT Re-Evaluation 04/26/22    PT Start Time 1315    PT Stop Time 3545    PT Time Calculation (min) 42 min    Activity Tolerance Patient tolerated treatment well    Behavior During Therapy [facility] for tasks assessed/performed              Past Medical History:  Diagnosis Date   Anemia    Cervical disc disease    Chronic kidney disease (CKD), stage V ([place])    Diabetes mellitus    Hep C w/o coma, chronic ([place])    Hypertension    Lumbar disc disease    Peritoneal dialysis status ([place])    Renal disorder    Past Surgical History:  Procedure Laterality Date   TUBAL LIGATION     Patient Active Problem List   Diagnosis Date Noted   Anxiety 07/17/2018   ESRD on peritoneal dialysis ([place]) 07/17/2018   H/O cesarean section 07/17/2018   Evaluation by medical service required 04/02/2018   Pre-transplant evaluation for end stage renal disease 11/18/2017   Unilateral inguinal hernia 11/18/2017   Hyperlipidemia 01/22/2017   Palpitations 01/22/2017   Dyspnea on exertion 01/22/2017   CKD (chronic kidney disease) stage 3, GFR 30-59 ml/min (HCC) 10/18/2016   Non-compliance 10/18/2016   Chronic ethmoidal sinusitis 10/18/2016   Hepatitis C, chronic ([place]) 05/15/2012   Diabetes mellitus type 2, insulin dependent ([place]) 05/15/2012   Hypertension 05/15/2012   Anemia 05/15/2012    PCP: Eldridge Abrahams  REFERRING PROVIDER: Earlie Server  REFERRING DIAG: s/p shoulder scope  THERAPY DIAG:  Stiffness of right shoulder, not elsewhere classified  Acute pain of right shoulder  Muscle weakness (generalized)  Abnormal posture  Unspecified lack of coordination  Localized edema  Rationale for Evaluation and Treatment Rehabilitation  ONSET DATE: 10/18/21  SUBJECTIVE:                                                                                                                                                                                       SUBJECTIVE STATEMENT: My back is killing me today, my shoulder is okay but it feels heavy like something is weighing it down.   PERTINENT HISTORY: R shoulder scope, DM, HTN, CKD  PAIN:  Are you having pain? Yes: NPRS scale: 3/10 Pain location: R shoulder and elbow Pain description: sore Aggravating factors: opening doors, jars, bottles, reaching up, lifting objects Relieving factors:  ice, heat, stretching, medications  PRECAUTIONS: None  WEIGHT BEARING RESTRICTIONS No  FALLS:  Has patient fallen in last 6 months? No  LIVING ENVIRONMENT: Lives with: lives alone Lives in: House/apartment Stairs: Yes: Internal: 14 steps; can reach both Has following equipment at home: Single point cane, Walker - 2 wheeled, and bed side commode  OCCUPATION: On disability   PLOF: Independent  PATIENT GOALS to be able to reach overhead, have no more pain   OBJECTIVE:   DIAGNOSTIC FINDINGS:  IMPRESSION: Calcific tendinosis of the distal supraspinatus and infraspinatus tendons with compact 2.2 cm calcific deposit along the bursal surface at the footprint and associated subacromial-subdeltoid bursitis. There is bursal sided fraying and probable low-grade tearing of the distal supraspinatus infraspinatus tendons at the footprint.   Mild AC joint arthropathy.  PATIENT SURVEYS:  FOTO 48/100  COGNITION:  Overall cognitive status: Within functional limits for tasks assessed     SENSATION: WFL  POSTURE: Rounded head, forward head   UPPER EXTREMITY ROM:   Active ROM Right eval Left eval  Shoulder flexion 120   Shoulder extension    Shoulder abduction 90 w/pain   Shoulder adduction    Shoulder internal rotation WFL pain at end range   Shoulder external rotation 25   Elbow flexion WNL   Elbow extension  WNL   Wrist flexion    Wrist extension    Wrist ulnar deviation    Wrist radial deviation    Wrist pronation    Wrist supination    (Blank rows = not tested)  UPPER EXTREMITY MMT: pain with all on testing R side   MMT Right eval Left eval  Shoulder flexion 2- 3  Shoulder extension    Shoulder abduction 2- 3  Shoulder adduction    Shoulder internal rotation 2   Shoulder external rotation 2    Middle trapezius    Lower trapezius    Elbow flexion 4   Elbow extension 4    Wrist flexion    Wrist extension    Wrist ulnar deviation    Wrist radial deviation    Wrist pronation    Wrist supination    Grip strength (lbs)    (Blank rows = not tested)  SHOULDER SPECIAL TESTS:  Impingement tests: Neer impingement test: positive , Hawkins/Kennedy impingement test: positive , and Painful arc test: positive   Rotator cuff assessment: Empty can test: positive   Biceps assessment: Speed's test: positive   JOINT MOBILITY TESTING:  Sharp pain w/abd at 90d, increased muscle guarding with all PROM   PALPATION:  TTP R biceps, tight upper trap   TODAY'S TREATMENT:  03/05/22 UBE L1 x60mns PROM flexion/ext/IR/ER Supine flexion AAROM 1# WaTE 2x10 redTB rows 2x10 greenTB ext 2x10 redTB ER 2x10 OHP with yellow ball 2x10    02/26/22 NuStep with arms L3 x 6 minutes AAROM with dowell into shoulder flexion, abd, IR/ER 10 each. Standing shouler ext, Rows, ER against Red Tband resistance 2 x 10 reps of each.  Eval and POC   PATIENT EDUCATION: Education details: POC Person educated: Patient Education method: Explanation Education comprehension: verbalized understanding   HOME EXERCISE PROGRAM: TBD  ASSESSMENT:  CLINICAL IMPRESSION: Patient is doing okay with R shoulder. She is still very guarded with PROM of R shoulder especially into ABD and ER. We continued to work on her range today. Also worked on [employer], retailactivities. Continue to progress as tolerated.    OBJECTIVE IMPAIRMENTS decreased ROM, decreased strength, impaired UE functional use,  postural dysfunction, and pain.   ACTIVITY LIMITATIONS carrying, lifting, dressing, reach over head, and hygiene/grooming  PARTICIPATION LIMITATIONS: cleaning, laundry, shopping, and yard work  [employer] POTENTIAL: Good  CLINICAL DECISION MAKING: Stable/uncomplicated  EVALUATION COMPLEXITY: Low   GOALS: Goals reviewed with patient? Yes  SHORT TERM GOALS: Target date: 03/29/22  Patient will be independent with initial HEP.  Goal status: INITIAL   LONG TERM GOALS: Target date: 04/26/22  Patient will be independent with advanced/ongoing HEP to improve outcomes and carryover.  Goal status: INITIAL  2.  Patient will report <2/10 at worst in R shoulder pain to improve QOL.  Goal status: INITIAL  3.  Patient to demonstrate improved upright posture with posterior shoulder girdle engaged to promote improved glenohumeral joint mobility. Goal status: INITIAL  4.  Patient to improve R shoulder AROM to [facility] without pain provocation to allow for increased ease with ADLs.  Goal status: INITIAL  5.  Patient will demonstrate improved functional UE strength by 1 grade in all weak muscle groups.  Goal status: INITIAL  6  Patient will report 60 on FOTO to demonstrate improved functional ability.  Baseline: 48 Goal status: INITIAL  PLAN: PT FREQUENCY: 1-2x/week  PT DURATION: 8 weeks  PLANNED INTERVENTIONS: Therapeutic exercises, Therapeutic activity, Neuromuscular re-education, Balance training, Gait training, Patient/Family education, Self Care, Joint mobilization, Dry Needling, Electrical stimulation, Cryotherapy, Moist heat, Taping, Vasopneumatic device, Ultrasound, Ionotophoresis '4mg'$ /ml Dexamethasone, and Manual therapy  PLAN FOR NEXT SESSION: PROM, AAROM as tolerated, Vaso for pain management    Marcelina Morel, DPT 03/05/2022, 1:57 PM

## 2022-03-06 ENCOUNTER — Encounter: Payer: Self-pay | Admitting: Physician Assistant

## 2022-03-06 ENCOUNTER — Ambulatory Visit
Admission: RE | Admit: 2022-03-06 | Discharge: 2022-03-06 | Disposition: A | Discharge status: Home / Self Care | Payer: Medicare Other | Source: Ambulatory Visit | Attending: Orthopedic Surgery | Admitting: Orthopedic Surgery

## 2022-03-06 ENCOUNTER — Ambulatory Visit (INDEPENDENT_AMBULATORY_CARE_PROVIDER_SITE_OTHER): Payer: Medicare Other | Admitting: Physician Assistant

## 2022-03-06 VITALS — BP 104/72 | HR 86 | Ht 63.0 in | Wt 177.8 lb

## 2022-03-06 DIAGNOSIS — I10 Essential (primary) hypertension: Secondary | ICD-10-CM | POA: Diagnosis not present

## 2022-03-06 DIAGNOSIS — R0609 Other forms of dyspnea: Secondary | ICD-10-CM | POA: Diagnosis not present

## 2022-03-06 DIAGNOSIS — E785 Hyperlipidemia, unspecified: Secondary | ICD-10-CM | POA: Diagnosis not present

## 2022-03-06 DIAGNOSIS — R072 Precordial pain: Secondary | ICD-10-CM | POA: Diagnosis not present

## 2022-03-06 DIAGNOSIS — M5412 Radiculopathy, cervical region: Secondary | ICD-10-CM

## 2022-03-06 DIAGNOSIS — E119 Type 2 diabetes mellitus without complications: Secondary | ICD-10-CM

## 2022-03-06 DIAGNOSIS — Z940 Kidney transplant status: Secondary | ICD-10-CM

## 2022-03-06 MED ORDER — AMLODIPINE BESYLATE 2.5 MG PO TABS: 2.5000 mg | ORAL_TABLET | Freq: Every day | ORAL | 1 refills | Status: DC

## 2022-03-06 MED ORDER — METOPROLOL SUCCINATE ER 50 MG PO TB24: ORAL_TABLET | ORAL | 1 refills | Status: DC

## 2022-03-06 NOTE — Progress Notes (Unsigned)
Cardiology Office Note:    Date:  03/08/2022   ID:  Maureen Barnes, DOB 1961/03/03, MRN [id number]  PCP:  Berkley Harvey, NP   [facility] Cardiologist:  Quay Burow, MD     Referring MD: Berkley Harvey, NP   Chief Complaint  Patient presents with   Follow-up    Seen for Dr. Gwenlyn Found    History of Present Illness:    Maureen Barnes is a 61 y.o. female with a hx of HTN, HLD, DM 2, ESRD s/p renal transplant 02/28/2019 on chronic immunosuppression with Prograf and prednisone, hepatitis C s/p treatment in 2012 and palpitation.  Echocardiogram obtained in July 2018 showed EF 60 to 65%, no regional wall motion abnormality, grade 2 DD, mild MR.  She had significant hypotension on carvedilol in the past and was switched to atenolol.  Echocardiogram obtained on 01/20/2020 showed EF 60 to 65%, normal LV wall thickness, no significant valve issue.  I last saw the patient on 01/06/2019 for evaluation palpitation.  She typically has pounding sensation already in the morning when she wake up, I increased her Toprol-XL to 50 mg a.m. and 25 mg p.m.  3-day heart monitor shows normal sinus rhythm without significant irregular rhythm.  PVC burden less than 1%.  Patient was later referred to Dr. Debara Pickett for evaluation of hyperlipidemia.  Zetia was recommended, however she did not never started this medication due to concern for GI side effects.  More recently, patient went to [facility]/2022 for evaluation of dyspnea and palpitation.  She also complained of heartburn and right shoulder pain.  She was instructed to seek urgent medical attention in the emergency room, however it does not appear she has been seen by the ED since.  Patient presents today for follow-up.  In the past few weeks, she has been having dyspnea on exertion and intermittent chest discomfort.  She also has pounding sensation in her chest.  I recommended decreasing amlodipine to 2.5 mg daily to create  enough blood pressure room to increase Toprol-XL to 50 mg twice a day.  We will obtain an echocardiogram and a nuclear stress test and have the patient follow-up in 6 weeks.  Recent blood work shows normal TSH, red blood cell count, renal function and potassium, patient did have low magnesium level 1.5 on 03/01/2022.  She is taking a supplement for this.   Past Medical History:  Diagnosis Date   Anemia    Cervical disc disease    Chronic kidney disease (CKD), stage V (HCC)    Diabetes mellitus    Hep C w/o coma, chronic (HCC)    Hypertension    Lumbar disc disease    Peritoneal dialysis status ([place])    Renal disorder     Past Surgical History:  Procedure Laterality Date   TUBAL LIGATION      Current Medications: Current Meds  Medication Sig   amLODipine (NORVASC) 2.5 MG tablet Take 1 tablet (2.5 mg total) by mouth daily.   aspirin EC 81 MG tablet Take 81 mg by mouth daily.   atorvastatin (LIPITOR) 20 MG tablet Take 1 tablet by mouth at bedtime.   b complex-vitamin c-folic acid (NEPHRO-VITE) 0.8 MG TABS tablet Take 1 tablet by mouth daily.   chlorthalidone (HYGROTON) 25 MG tablet Take 12.5 mg by mouth daily.   ciclopirox (PENLAC) 8 % solution Apply topically at bedtime. Apply over nail and surrounding skin. Apply daily over previous coat. After  seven (7) days, may remove with alcohol and continue cycle.   Continuous Blood Gluc Receiver (DEXCOM G6 RECEIVER) DEVI Use as directed for continuous glucose monitoring.   Continuous Blood Gluc Sensor (DEXCOM G6 SENSOR) MISC Inject 1 sensor to the skin every 10 days for continuous glucose monitoring.   Continuous Blood Gluc Transmit (DEXCOM G6 TRANSMITTER) MISC Use as directed for continuous glucose monitoring. Reuse transmitter for 90 days then discard and replace.   docusate sodium (COLACE) 100 MG capsule Take 100 mg by mouth 2 (two) times daily.   ENVARSUS XR 4 MG TB24 Take 2 tablets by mouth daily.   famotidine (PEPCID) 10 MG tablet Take  10 mg by mouth 2 (two) times daily.   fluticasone (FLONASE) 50 MCG/ACT nasal spray Place 2 sprays into both nostrils daily.   K Phos Mono-Sod Phos Di & Mono (K-PHOS-NEUTRAL) [phone] MG TABS Take 1 tablet by mouth daily.   loratadine (CLARITIN) 10 MG tablet Take 10 mg by mouth as directed.   magnesium oxide (MAG-OX) 400 MG tablet Take 2 tablets by mouth 2 (two) times daily.   methocarbamol (ROBAXIN) 500 MG tablet Take 500 mg by mouth every 8 (eight) hours as needed.   mycophenolate (MYFORTIC) 180 MG EC tablet Take 180 mg by mouth in the morning, at noon, and at bedtime.   omeprazole (PRILOSEC) 20 MG capsule Take 20 mg by mouth 2 (two) times daily.   predniSONE (DELTASONE) 5 MG tablet Take 5 mg by mouth daily.   simethicone (MYLICON) 80 MG chewable tablet Chew 80 mg by mouth every 6 (six) hours as needed for flatulence.   sulfamethoxazole-trimethoprim (BACTRIM) 400-80 MG tablet Take 1 tablet by mouth 3 (three) times a week.   tiZANidine (ZANAFLEX) 2 MG tablet Take by mouth.   TYLENOL 500 MG tablet Take 1,000 mg by mouth 3 (three) times daily as needed.   [DISCONTINUED] amLODipine (NORVASC) 5 MG tablet Take 5 mg by mouth daily.   [DISCONTINUED] metoprolol succinate (TOPROL-XL) 50 MG 24 hr tablet Take by mouth 50 mg in the mornings and 25 mg (half tablet) in the evenings     Allergies:   Peanut oil, Shellfish-derived products, Iodine, Peanut-containing drug products, and Shellfish allergy   Social History   Socioeconomic History   Marital status: Single    Spouse name: Not on file   Number of children: Not on file   Years of education: Not on file   Highest education level: Not on file  Occupational History   Not on file  Tobacco Use   Smoking status: Never   Smokeless tobacco: Never  Substance and Sexual Activity   Alcohol use: No   Drug use: No   Sexual activity: Not Currently    Birth control/protection: Surgical  Other Topics Concern   Not on file  Social History Narrative    Not on file   Social Determinants of Health   Financial Resource Strain: Not on file  Food Insecurity: Not on file  Transportation Needs: Not on file  Physical Activity: Not on file  Stress: Not on file  Social Connections: Not on file     Family History: The patient's family history includes Breast cancer in her mother; Cancer in her mother; Diabetes in her father; Hypertension in her father and mother.  ROS:   Please see the history of present illness.     All other systems reviewed and are negative.  EKGs/Labs/Other Studies Reviewed:    The following studies were reviewed today:  Echo 02/04/2017 LV EF: 60% -   65%   -------------------------------------------------------------------  Indications:      (R06.00 Dyspnea. R00.2 Palpitations).   -------------------------------------------------------------------  History:   Risk factors:  Family history of coronary artery  disease. Hypertension. Diabetes mellitus. Dyslipidemia.   -------------------------------------------------------------------  Study Conclusions   - Left ventricle: The cavity size was normal. Wall thickness was    increased in a pattern of mild LVH. Systolic function was normal.    The estimated ejection fraction was in the range of 60% to 65%.    Wall motion was normal; there were no regional wall motion    abnormalities. Features are consistent with a pseudonormal left    ventricular filling pattern, with concomitant abnormal relaxation    and increased filling pressure (grade 2 diastolic dysfunction).  - Mitral valve: There was mild regurgitation.   EKG:  EKG is ordered today.  The ekg ordered today demonstrates normal sinus rhythm, poor R wave progression in the anterior leads.   Recent Labs: No results found for requested labs within last 365 days.  Recent Lipid Panel    Component Value Date/Time   CHOL 186 11/01/2021 0826   TRIG 84 11/01/2021 0826   HDL 62 11/01/2021 0826   CHOLHDL 3.0  11/01/2021 0826   LDLCALC 109 (H) 11/01/2021 0826     Risk Assessment/Calculations:           Physical Exam:    VS:  BP 104/72 (BP Location: Left Arm, Patient Position: Sitting, Cuff Size: Large)   Pulse 86   Ht '5\' 3"'$  (1.6 m)   Wt 177 lb 12.8 oz (80.6 kg)   LMP 06/26/2011   SpO2 97%   BMI 31.50 kg/m        Wt Readings from Last 3 Encounters:  03/06/22 177 lb 12.8 oz (80.6 kg)  11/09/21 181 lb 9.6 oz (82.4 kg)  07/06/21 175 lb (79.4 kg)     GEN:  Well nourished, well developed in no acute distress HEENT: Normal NECK: No JVD; No carotid bruits LYMPHATICS: No lymphadenopathy CARDIAC: RRR, no murmurs, rubs, gallops RESPIRATORY:  Clear to auscultation without rales, wheezing or rhonchi  ABDOMEN: Soft, non-tender, non-distended MUSCULOSKELETAL:  No edema; No deformity  SKIN: Warm and dry NEUROLOGIC:  Alert and oriented x 3 PSYCHIATRIC:  Normal affect   ASSESSMENT:    1. Precordial pain   2. DOE (dyspnea on exertion)   3. Primary hypertension   4. Hyperlipidemia LDL goal <70   5. Controlled type 2 diabetes mellitus without complication, without long-term current use of insulin ([place])   6. Renal transplant recipient    PLAN:    In order of problems listed above:  Precordial chest pain: Obtain nuclear stress test.  Reduce the dose of amlodipine to 2.5 mg daily, increase metoprolol succinate to 50 mg twice a day  Dyspnea on exertion: Obtain echocardiogram  Hypertension: Blood pressure stable.  Will increase metoprolol succinate to 50 mg twice a day while reducing the amlodipine to 2.5 mg daily.  DM2: Managed by primary care provider  Renal transplant recipient: Stable renal function on the last lab work.           Medication Adjustments/Labs and Tests Ordered: Current medicines are reviewed at length with the patient today.  Concerns regarding medicines are outlined above.  Orders Placed This Encounter  Procedures   MYOCARDIAL PERFUSION IMAGING   EKG  12-Lead   ECHOCARDIOGRAM COMPLETE   Meds ordered this encounter  Medications  amLODipine (NORVASC) 2.5 MG tablet    Sig: Take 1 tablet (2.5 mg total) by mouth daily.    Dispense:  90 tablet    Refill:  1   metoprolol succinate (TOPROL-XL) 50 MG 24 hr tablet    Sig: Take 1 tablet ('50mg'$ ) twice daily.    Dispense:  270 tablet    Refill:  1    Dose change new Rx    Patient Instructions  Medication Instructions:  Your physician has recommended you make the following change in your medication:  DECREASE: Amlodipine 2.'5mg'$  daily  INCREASE: Metoprolol '50mg'$  TWICE DAILY.  *If you need a refill on your cardiac medications before your next appointment, please call your pharmacy*   Lab Work: NONE If you have labs (blood work) drawn today and your tests are completely normal, you will receive your results only by: [place] (if you have MyChart) OR A paper copy in the mail If you have any lab test that is abnormal or we need to change your treatment, we will call you to review the results.   Testing/Procedures: Your physician has requested that you have a lexiscan myoview. For further information please visit [email or web address]. Please follow instruction sheet, as given.   The test will take approximately 3 to 4 hours to complete; you may bring reading material.  If someone comes with you to your appointment, they will need to remain in the main lobby due to limited space in the testing area. **If you are pregnant or breastfeeding, please notify the nuclear lab prior to your appointment**  How to prepare for your Myocardial Perfusion Test: Do not eat or drink 3 hours prior to your test, except you may have water. Do not consume products containing caffeine (regular or decaffeinated) 12 hours prior to your test. (ex: coffee, chocolate, sodas, tea). Do bring a list of your current medications with you.  If not listed below, you may take your medications as normal. Do wear comfortable  clothes (no dresses or overalls) and walking shoes, tennis shoes preferred (No heels or open toe shoes are allowed). Do NOT wear cologne, perfume, aftershave, or lotions (deodorant is allowed). If these instructions are not followed, your test will have to be rescheduled.   Your physician has requested that you have an echocardiogram. Echocardiography is a painless test that uses sound waves to create images of your heart. It provides your doctor with information about the size and shape of your heart and how well your heart's chambers and valves are working. This procedure takes approximately one hour. There are no restrictions for this procedure.    Follow-Up: At [facility], you and your health needs are our priority.  As part of our continuing mission to provide you with exceptional heart care, we have created designated Provider Care Teams.  These Care Teams include your primary Cardiologist (physician) and Advanced Practice Providers (APPs -  Physician Assistants and Nurse Practitioners) who all work together to provide you with the care you need, when you need it.  We recommend signing up for the patient portal called "MyChart".  Sign up information is provided on this After Visit Summary.  MyChart is used to connect with patients for Virtual Visits (Telemedicine).  Patients are able to view lab/test results, encounter notes, upcoming appointments, etc.  Non-urgent messages can be sent to your provider as well.   To learn more about what you can do with MyChart, go to [email or web address].    Your next appointment:   6  week(s)  The format for your next appointment:   In Person  Provider:   Almyra Deforest, PA-C    {   Signed, Almyra Deforest, [place]  03/08/2022 11:58 PM    [place]

## 2022-03-06 NOTE — Patient Instructions (Signed)
Medication Instructions:  Your physician has recommended you make the following change in your medication:  DECREASE: Amlodipine 2.'5mg'$  daily  INCREASE: Metoprolol '50mg'$  TWICE DAILY.  *If you need a refill on your cardiac medications before your next appointment, please call your pharmacy*   Lab Work: NONE If you have labs (blood work) drawn today and your tests are completely normal, you will receive your results only by: [place] (if you have MyChart) OR A paper copy in the mail If you have any lab test that is abnormal or we need to change your treatment, we will call you to review the results.   Testing/Procedures: Your physician has requested that you have a lexiscan myoview. For further information please visit [email or web address]. Please follow instruction sheet, as given.   The test will take approximately 3 to 4 hours to complete; you may bring reading material.  If someone comes with you to your appointment, they will need to remain in the main lobby due to limited space in the testing area. **If you are pregnant or breastfeeding, please notify the nuclear lab prior to your appointment**  How to prepare for your Myocardial Perfusion Test: Do not eat or drink 3 hours prior to your test, except you may have water. Do not consume products containing caffeine (regular or decaffeinated) 12 hours prior to your test. (ex: coffee, chocolate, sodas, tea). Do bring a list of your current medications with you.  If not listed below, you may take your medications as normal. Do wear comfortable clothes (no dresses or overalls) and walking shoes, tennis shoes preferred (No heels or open toe shoes are allowed). Do NOT wear cologne, perfume, aftershave, or lotions (deodorant is allowed). If these instructions are not followed, your test will have to be rescheduled.   Your physician has requested that you have an echocardiogram. Echocardiography is a painless test that uses sound waves to  create images of your heart. It provides your doctor with information about the size and shape of your heart and how well your heart's chambers and valves are working. This procedure takes approximately one hour. There are no restrictions for this procedure.    Follow-Up: At [facility], you and your health needs are our priority.  As part of our continuing mission to provide you with exceptional heart care, we have created designated Provider Care Teams.  These Care Teams include your primary Cardiologist (physician) and Advanced Practice Providers (APPs -  Physician Assistants and Nurse Practitioners) who all work together to provide you with the care you need, when you need it.  We recommend signing up for the patient portal called "MyChart".  Sign up information is provided on this After Visit Summary.  MyChart is used to connect with patients for Virtual Visits (Telemedicine).  Patients are able to view lab/test results, encounter notes, upcoming appointments, etc.  Non-urgent messages can be sent to your provider as well.   To learn more about what you can do with MyChart, go to [email or web address].    Your next appointment:   6 week(s)  The format for your next appointment:   In Person  Provider:   Almyra Deforest, PA-C    {

## 2022-03-08 ENCOUNTER — Encounter: Payer: Self-pay | Admitting: Physician Assistant

## 2022-03-09 ENCOUNTER — Ambulatory Visit: Payer: Medicare Other

## 2022-03-09 DIAGNOSIS — M25511 Pain in right shoulder: Secondary | ICD-10-CM

## 2022-03-09 DIAGNOSIS — M6281 Muscle weakness (generalized): Secondary | ICD-10-CM

## 2022-03-09 DIAGNOSIS — M25611 Stiffness of right shoulder, not elsewhere classified: Secondary | ICD-10-CM

## 2022-03-09 DIAGNOSIS — R293 Abnormal posture: Secondary | ICD-10-CM

## 2022-03-09 NOTE — Therapy (Signed)
OUTPATIENT PHYSICAL THERAPY SHOULDER TREATMENT   Patient Name: Maureen Barnes MRN: [id number] DOB:12-10-1960, 61 y.o., female Today's Date: 03/09/2022   PT End of Session - 03/09/22 1059     Visit Number 4    Date for PT Re-Evaluation 04/26/22    PT Start Time 1100    Activity Tolerance Patient tolerated treatment well    Behavior During Therapy [facility] for tasks assessed/performed               Past Medical History:  Diagnosis Date   Anemia    Cervical disc disease    Chronic kidney disease (CKD), stage V ([place])    Diabetes mellitus    Hep C w/o coma, chronic ([place])    Hypertension    Lumbar disc disease    Peritoneal dialysis status ([place])    Renal disorder    Past Surgical History:  Procedure Laterality Date   TUBAL LIGATION     Patient Active Problem List   Diagnosis Date Noted   Anxiety 07/17/2018   ESRD on peritoneal dialysis ([place]) 07/17/2018   H/O cesarean section 07/17/2018   Evaluation by medical service required 04/02/2018   Pre-transplant evaluation for end stage renal disease 11/18/2017   Unilateral inguinal hernia 11/18/2017   Hyperlipidemia 01/22/2017   Palpitations 01/22/2017   Dyspnea on exertion 01/22/2017   CKD (chronic kidney disease) stage 3, GFR 30-59 ml/min ([place]) 10/18/2016   Non-compliance 10/18/2016   Chronic ethmoidal sinusitis 10/18/2016   Hepatitis C, chronic ([place]) 05/15/2012   Diabetes mellitus type 2, insulin dependent ([place]) 05/15/2012   Hypertension 05/15/2012   Anemia 05/15/2012    PCP: Eldridge Abrahams  REFERRING PROVIDER: Earlie Server  REFERRING DIAG: s/p shoulder scope  THERAPY DIAG:  Stiffness of right shoulder, not elsewhere classified  Acute pain of right shoulder  Muscle weakness (generalized)  Abnormal posture  Rationale for Evaluation and Treatment Rehabilitation  ONSET DATE: 10/18/21  SUBJECTIVE:                                                                                                                                                                                       SUBJECTIVE STATEMENT: Shoulder is doing okay, still some stiffness in it. Today it is 3/10 which is good for me.   PERTINENT HISTORY: R shoulder scope, DM, HTN, CKD  PAIN:  Are you having pain? Yes: NPRS scale: 3/10 Pain location: R shoulder and elbow Pain description: sore Aggravating factors: opening doors, jars, bottles, reaching up, lifting objects Relieving factors: ice, heat, stretching, medications  PRECAUTIONS: None  WEIGHT BEARING RESTRICTIONS No  FALLS:  Has patient fallen in last 6 months? No  LIVING  ENVIRONMENT: Lives with: lives alone Lives in: House/apartment Stairs: Yes: Internal: 14 steps; can reach both Has following equipment at home: Single point cane, Walker - 2 wheeled, and bed side commode  OCCUPATION: On disability   PLOF: Independent  PATIENT GOALS to be able to reach overhead, have no more pain   OBJECTIVE:   DIAGNOSTIC FINDINGS:  IMPRESSION: Calcific tendinosis of the distal supraspinatus and infraspinatus tendons with compact 2.2 cm calcific deposit along the bursal surface at the footprint and associated subacromial-subdeltoid bursitis. There is bursal sided fraying and probable low-grade tearing of the distal supraspinatus infraspinatus tendons at the footprint.   Mild AC joint arthropathy.  PATIENT SURVEYS:  FOTO 48/100  COGNITION:  Overall cognitive status: Within functional limits for tasks assessed     SENSATION: WFL  POSTURE: Rounded head, forward head   UPPER EXTREMITY ROM:   Active ROM Right eval Left eval  Shoulder flexion 120   Shoulder extension    Shoulder abduction 90 w/pain   Shoulder adduction    Shoulder internal rotation WFL pain at end range   Shoulder external rotation 25   Elbow flexion WNL   Elbow extension WNL   Wrist flexion    Wrist extension    Wrist ulnar deviation    Wrist radial deviation    Wrist pronation    Wrist  supination    (Blank rows = not tested)  UPPER EXTREMITY MMT: pain with all on testing R side   MMT Right eval Left eval  Shoulder flexion 2- 3  Shoulder extension    Shoulder abduction 2- 3  Shoulder adduction    Shoulder internal rotation 2   Shoulder external rotation 2    Middle trapezius    Lower trapezius    Elbow flexion 4   Elbow extension 4    Wrist flexion    Wrist extension    Wrist ulnar deviation    Wrist radial deviation    Wrist pronation    Wrist supination    Grip strength (lbs)    (Blank rows = not tested)  SHOULDER SPECIAL TESTS:  Impingement tests: Neer impingement test: positive , Hawkins/Kennedy impingement test: positive , and Painful arc test: positive   Rotator cuff assessment: Empty can test: positive   Biceps assessment: Speed's test: positive   JOINT MOBILITY TESTING:  Sharp pain w/abd at 90d, increased muscle guarding with all PROM   PALPATION:  TTP R biceps, tight upper trap   TODAY'S TREATMENT:  03/09/22 Nustep L4 x19mns  redTB ER/IR 2x10 Seated rows 10# 2x10 Lat pull downs 15# 2x10 Standing flexion 2# 2x10 Shoulder extension 2# 2x10  Scaption with 2# 2x10  Lateral raises 1# 2x10  Shoulder shrugs 2# 2x10    03/05/22 UBE L1 x656ms PROM flexion/ext/IR/ER Supine flexion AAROM 1# WaTE 2x10 redTB rows 2x10 greenTB ext 2x10 redTB ER 2x10 OHP with yellow ball 2x10    02/26/22 NuStep with arms L3 x 6 minutes AAROM with dowell into shoulder flexion, abd, IR/ER 10 each. Standing shouler ext, Rows, ER against Red Tband resistance 2 x 10 reps of each.  Eval and POC   PATIENT EDUCATION: Education details: POC Person educated: Patient Education method: Explanation Education comprehension: verbalized understanding   HOME EXERCISE PROGRAM: TBD  ASSESSMENT:  CLINICAL IMPRESSION: Due to lower pain levels today, we worked on some more strengthening exercises. Most difficulty with ER using band. Does well with standing flexion,  able to reach full ranges. Progress as tolerated.   OBJECTIVE  IMPAIRMENTS decreased ROM, decreased strength, impaired UE functional use, postural dysfunction, and pain.   ACTIVITY LIMITATIONS carrying, lifting, dressing, reach over head, and hygiene/grooming  PARTICIPATION LIMITATIONS: cleaning, laundry, shopping, and yard work  [employer] POTENTIAL: Good  CLINICAL DECISION MAKING: Stable/uncomplicated  EVALUATION COMPLEXITY: Low   GOALS: Goals reviewed with patient? Yes  SHORT TERM GOALS: Target date: 03/29/22  Patient will be independent with initial HEP.  Goal status: INITIAL   LONG TERM GOALS: Target date: 04/26/22  Patient will be independent with advanced/ongoing HEP to improve outcomes and carryover.  Goal status: INITIAL  2.  Patient will report <2/10 at worst in R shoulder pain to improve QOL.  Goal status: INITIAL  3.  Patient to demonstrate improved upright posture with posterior shoulder girdle engaged to promote improved glenohumeral joint mobility. Goal status: INITIAL  4.  Patient to improve R shoulder AROM to [facility] without pain provocation to allow for increased ease with ADLs.  Goal status: INITIAL  5.  Patient will demonstrate improved functional UE strength by 1 grade in all weak muscle groups.  Goal status: INITIAL  6  Patient will report 51 on FOTO to demonstrate improved functional ability.  Baseline: 48 Goal status: INITIAL  PLAN: PT FREQUENCY: 1-2x/week  PT DURATION: 8 weeks  PLANNED INTERVENTIONS: Therapeutic exercises, Therapeutic activity, Neuromuscular re-education, Balance training, Gait training, Patient/Family education, Self Care, Joint mobilization, Dry Needling, Electrical stimulation, Cryotherapy, Moist heat, Taping, Vasopneumatic device, Ultrasound, Ionotophoresis '4mg'$ /ml Dexamethasone, and Manual therapy  PLAN FOR NEXT SESSION: PROM, AAROM as tolerated, Vaso for pain management    Andris Baumann, DPT 03/09/2022, 11:44 AM

## 2022-03-13 ENCOUNTER — Encounter: Payer: Self-pay | Admitting: Physical Therapy

## 2022-03-13 ENCOUNTER — Telehealth (HOSPITAL_COMMUNITY): Payer: Self-pay | Admitting: *Deleted

## 2022-03-13 ENCOUNTER — Ambulatory Visit: Payer: Medicare Other | Admitting: Physical Therapy

## 2022-03-13 DIAGNOSIS — M25511 Pain in right shoulder: Secondary | ICD-10-CM | POA: Diagnosis not present

## 2022-03-13 DIAGNOSIS — M6281 Muscle weakness (generalized): Secondary | ICD-10-CM

## 2022-03-13 DIAGNOSIS — M25611 Stiffness of right shoulder, not elsewhere classified: Secondary | ICD-10-CM

## 2022-03-13 NOTE — Telephone Encounter (Signed)
Patient given detailed instructions per Myocardial Perfusion Study Information Sheet for the test on 03/21/22 at 0800. Patient notified to arrive 15 minutes early and that it is imperative to arrive on time for appointment to keep from having the test rescheduled.  If you need to cancel or reschedule your appointment, please call the office within 24 hours of your appointment. . Patient verbalized understanding.Maureen Barnes, Ranae Palms

## 2022-03-13 NOTE — Therapy (Signed)
OUTPATIENT PHYSICAL THERAPY SHOULDER TREATMENT   Patient Name: Maureen Barnes MRN: [id number] DOB:September 11, 1960, 61 y.o., female Today's Date: 03/13/2022   PT End of Session - 03/13/22 1144     Visit Number 5    Date for PT Re-Evaluation 04/26/22    PT Start Time 4332    PT Stop Time 9518    PT Time Calculation (min) 45 min    Activity Tolerance Patient tolerated treatment well    Behavior During Therapy [facility] for tasks assessed/performed               Past Medical History:  Diagnosis Date   Anemia    Cervical disc disease    Chronic kidney disease (CKD), stage V ([place])    Diabetes mellitus    Hep C w/o coma, chronic ([place])    Hypertension    Lumbar disc disease    Peritoneal dialysis status ([place])    Renal disorder    Past Surgical History:  Procedure Laterality Date   TUBAL LIGATION     Patient Active Problem List   Diagnosis Date Noted   Anxiety 07/17/2018   ESRD on peritoneal dialysis ([place]) 07/17/2018   H/O cesarean section 07/17/2018   Evaluation by medical service required 04/02/2018   Pre-transplant evaluation for end stage renal disease 11/18/2017   Unilateral inguinal hernia 11/18/2017   Hyperlipidemia 01/22/2017   Palpitations 01/22/2017   Dyspnea on exertion 01/22/2017   CKD (chronic kidney disease) stage 3, GFR 30-59 ml/min ([facility]) 10/18/2016   Non-compliance 10/18/2016   Chronic ethmoidal sinusitis 10/18/2016   Hepatitis C, chronic ([place]) 05/15/2012   Diabetes mellitus type 2, insulin dependent ([place]) 05/15/2012   Hypertension 05/15/2012   Anemia 05/15/2012    PCP: Eldridge Abrahams  REFERRING PROVIDER: Earlie Server  REFERRING DIAG: s/p shoulder scope  THERAPY DIAG:  Stiffness of right shoulder, not elsewhere classified  Acute pain of right shoulder  Muscle weakness (generalized)  Rationale for Evaluation and Treatment Rehabilitation  ONSET DATE: 10/18/21  SUBJECTIVE:                                                                                                                                                                                       SUBJECTIVE STATEMENT: Shoulder is ok, but is having terrible pain elsewhere. R shoulder has some stiffness. Low back and lower abdomen are really in pain    PERTINENT HISTORY: R shoulder scope, DM, HTN, CKD  PAIN:  Are you having pain? Yes: NPRS scale: 9/10 Pain location: abdomen and back Pain description: sore Aggravating factors: opening doors, jars, bottles, reaching up, lifting objects Relieving factors: ice, heat, stretching, medications  PRECAUTIONS: None  WEIGHT BEARING RESTRICTIONS No  FALLS:  Has patient fallen in last 6 months? No  LIVING ENVIRONMENT: Lives with: lives alone Lives in: House/apartment Stairs: Yes: Internal: 14 steps; can reach both Has following equipment at home: Single point cane, Walker - 2 wheeled, and bed side commode  OCCUPATION: On disability   PLOF: Independent  PATIENT GOALS to be able to reach overhead, have no more pain   OBJECTIVE:   DIAGNOSTIC FINDINGS:  IMPRESSION: Calcific tendinosis of the distal supraspinatus and infraspinatus tendons with compact 2.2 cm calcific deposit along the bursal surface at the footprint and associated subacromial-subdeltoid bursitis. There is bursal sided fraying and probable low-grade tearing of the distal supraspinatus infraspinatus tendons at the footprint.   Mild AC joint arthropathy.  PATIENT SURVEYS:  FOTO 48/100  COGNITION:  Overall cognitive status: Within functional limits for tasks assessed     SENSATION: WFL  POSTURE: Rounded head, forward head   UPPER EXTREMITY ROM:   Active ROM Right eval Left eval  Shoulder flexion 120   Shoulder extension    Shoulder abduction 90 w/pain   Shoulder adduction    Shoulder internal rotation WFL pain at end range   Shoulder external rotation 25   Elbow flexion WNL   Elbow extension WNL   Wrist flexion    Wrist extension     Wrist ulnar deviation    Wrist radial deviation    Wrist pronation    Wrist supination    (Blank rows = not tested)  UPPER EXTREMITY MMT: pain with all on testing R side   MMT Right eval Left eval  Shoulder flexion 2- 3  Shoulder extension    Shoulder abduction 2- 3  Shoulder adduction    Shoulder internal rotation 2   Shoulder external rotation 2    Middle trapezius    Lower trapezius    Elbow flexion 4   Elbow extension 4    Wrist flexion    Wrist extension    Wrist ulnar deviation    Wrist radial deviation    Wrist pronation    Wrist supination    Grip strength (lbs)    (Blank rows = not tested)  SHOULDER SPECIAL TESTS:  Impingement tests: Neer impingement test: positive , Hawkins/Kennedy impingement test: positive , and Painful arc test: positive   Rotator cuff assessment: Empty can test: positive   Biceps assessment: Speed's test: positive   JOINT MOBILITY TESTING:  Sharp pain w/abd at 90d, increased muscle guarding with all PROM   PALPATION:  TTP R biceps, tight upper trap   TODAY'S TREATMENT:  03/13/22 UBE L1 x3 min each  Rows Green 2x10 Ext green 2x10  Seated rows 10# 2x10 Lat pull downs 15# 2x10 Shrugs 2lb 2x10 Shoulder ER red 2x1 Shoulder flex 2lb 2x10  Scaption 2lb 2x10 Biceps curls 2lb 2x10    03/09/22 Nustep L4 x37mns  redTB ER/IR 2x10 Seated rows 10# 2x10 Lat pull downs 15# 2x10 Standing flexion 2# 2x10 Shoulder extension 2# 2x10  Scaption with 2# 2x10  Lateral raises 1# 2x10  Shoulder shrugs 2# 2x10    03/05/22 UBE L1 x693ms PROM flexion/ext/IR/ER Supine flexion AAROM 1# WaTE 2x10 redTB rows 2x10 greenTB ext 2x10 redTB ER 2x10 OHP with yellow ball 2x10    02/26/22 NuStep with arms L3 x 6 minutes AAROM with dowell into shoulder flexion, abd, IR/ER 10 each. Standing shouler ext, Rows, ER against Red Tband resistance 2 x 10 reps of each.  Eval and POC  PATIENT EDUCATION: Education details: POC Person educated:  Patient Education method: Explanation Education comprehension: verbalized understanding   HOME EXERCISE PROGRAM: TBD  ASSESSMENT:  CLINICAL IMPRESSION: Pt enters with reports of increase lumbar and abdomen pain. Intervention intensity kept to minium due to subjective reports. Cue to control eccentric phase needed with standing rows and extensions. Pt reports that she could feel it with with shrugs.   OBJECTIVE IMPAIRMENTS decreased ROM, decreased strength, impaired UE functional use, postural dysfunction, and pain.   ACTIVITY LIMITATIONS carrying, lifting, dressing, reach over head, and hygiene/grooming  PARTICIPATION LIMITATIONS: cleaning, laundry, shopping, and yard work  [employer] POTENTIAL: Good  CLINICAL DECISION MAKING: Stable/uncomplicated  EVALUATION COMPLEXITY: Low   GOALS: Goals reviewed with patient? Yes  SHORT TERM GOALS: Target date: 03/29/22  Patient will be independent with initial HEP.  Goal status: INITIAL   LONG TERM GOALS: Target date: 04/26/22  Patient will be independent with advanced/ongoing HEP to improve outcomes and carryover.  Goal status: INITIAL  2.  Patient will report <2/10 at worst in R shoulder pain to improve QOL.  Goal status: INITIAL  3.  Patient to demonstrate improved upright posture with posterior shoulder girdle engaged to promote improved glenohumeral joint mobility. Goal status: INITIAL  4.  Patient to improve R shoulder AROM to [facility] without pain provocation to allow for increased ease with ADLs.  Goal status: INITIAL  5.  Patient will demonstrate improved functional UE strength by 1 grade in all weak muscle groups.  Goal status: INITIAL  6  Patient will report 52 on FOTO to demonstrate improved functional ability.  Baseline: 48 Goal status: INITIAL  PLAN: PT FREQUENCY: 1-2x/week  PT DURATION: 8 weeks  PLANNED INTERVENTIONS: Therapeutic exercises, Therapeutic activity, Neuromuscular re-education, Balance training, Gait  training, Patient/Family education, Self Care, Joint mobilization, Dry Needling, Electrical stimulation, Cryotherapy, Moist heat, Taping, Vasopneumatic device, Ultrasound, Ionotophoresis '4mg'$ /ml Dexamethasone, and Manual therapy  PLAN FOR NEXT SESSION: PROM, AAROM as tolerated, Vaso for pain management    Andris Baumann, DPT 03/13/2022, 11:45 AM

## 2022-03-15 ENCOUNTER — Encounter: Payer: Self-pay | Admitting: Physical Therapy

## 2022-03-15 ENCOUNTER — Ambulatory Visit: Payer: Medicare Other | Admitting: Physical Therapy

## 2022-03-15 DIAGNOSIS — M25611 Stiffness of right shoulder, not elsewhere classified: Secondary | ICD-10-CM

## 2022-03-15 DIAGNOSIS — M25511 Pain in right shoulder: Secondary | ICD-10-CM

## 2022-03-15 DIAGNOSIS — M6281 Muscle weakness (generalized): Secondary | ICD-10-CM

## 2022-03-15 NOTE — Therapy (Signed)
OUTPATIENT PHYSICAL THERAPY SHOULDER TREATMENT   Patient Name: Maureen Barnes MRN: [id number] DOB:[birth year], 61 y.o., female Today's Date: 03/15/2022   PT End of Session - 03/15/22 1104     Visit Number 6    Date for PT Re-Evaluation 04/26/22    PT Start Time 76    PT Stop Time 2951    PT Time Calculation (min) 45 min    Activity Tolerance Patient tolerated treatment well    Behavior During Therapy [facility] for tasks assessed/performed               Past Medical History:  Diagnosis Date   Anemia    Cervical disc disease    Chronic kidney disease (CKD), stage V ([place])    Diabetes mellitus    Hep C w/o coma, chronic ([place])    Hypertension    Lumbar disc disease    Peritoneal dialysis status ([place])    Renal disorder    Past Surgical History:  Procedure Laterality Date   TUBAL LIGATION     Patient Active Problem List   Diagnosis Date Noted   Anxiety 07/17/2018   ESRD on peritoneal dialysis ([place]) 07/17/2018   H/O cesarean section 07/17/2018   Evaluation by medical service required 04/02/2018   Pre-transplant evaluation for end stage renal disease 11/18/2017   Unilateral inguinal hernia 11/18/2017   Hyperlipidemia 01/22/2017   Palpitations 01/22/2017   Dyspnea on exertion 01/22/2017   CKD (chronic kidney disease) stage 3, GFR 30-59 ml/min ([facility]) 10/18/2016   Non-compliance 10/18/2016   Chronic ethmoidal sinusitis 10/18/2016   Hepatitis C, chronic ([place]) 05/15/2012   Diabetes mellitus type 2, insulin dependent ([place]) 05/15/2012   Hypertension 05/15/2012   Anemia 05/15/2012    PCP: Eldridge Abrahams  REFERRING PROVIDER: Earlie Server  REFERRING DIAG: s/p shoulder scope  THERAPY DIAG:  Stiffness of right shoulder, not elsewhere classified  Acute pain of right shoulder  Muscle weakness (generalized)  Rationale for Evaluation and Treatment Rehabilitation  ONSET DATE: 10/18/21  SUBJECTIVE:                                                                                                                                                                                       SUBJECTIVE STATEMENT: Shoulder is good, but still has some stiffness  PERTINENT HISTORY: R shoulder scope, DM, HTN, CKD  PAIN:  Are you having pain? Yes: NPRS scale: 8/10 Pain location: abdomen and back Pain description: sore Aggravating factors: opening doors, jars, bottles, reaching up, lifting objects Relieving factors: ice, heat, stretching, medications  PRECAUTIONS: None  WEIGHT BEARING RESTRICTIONS No  FALLS:  Has patient fallen in last 6 months? No  LIVING ENVIRONMENT: Lives with: lives alone Lives in: House/apartment Stairs: Yes: Internal: 14 steps; can reach both Has following equipment at home: Single point cane, Walker - 2 wheeled, and bed side commode  OCCUPATION: On disability   PLOF: Independent  PATIENT GOALS to be able to reach overhead, have no more pain   OBJECTIVE:   DIAGNOSTIC FINDINGS:  IMPRESSION: Calcific tendinosis of the distal supraspinatus and infraspinatus tendons with compact 2.2 cm calcific deposit along the bursal surface at the footprint and associated subacromial-subdeltoid bursitis. There is bursal sided fraying and probable low-grade tearing of the distal supraspinatus infraspinatus tendons at the footprint.   Mild AC joint arthropathy.  PATIENT SURVEYS:  FOTO 48/100  COGNITION:  Overall cognitive status: Within functional limits for tasks assessed     SENSATION: WFL  POSTURE: Rounded head, forward head   UPPER EXTREMITY ROM:   Active ROM Right eval Left eval  Shoulder flexion 120   Shoulder extension    Shoulder abduction 90 w/pain   Shoulder adduction    Shoulder internal rotation WFL pain at end range   Shoulder external rotation 25   Elbow flexion WNL   Elbow extension WNL   Wrist flexion    Wrist extension    Wrist ulnar deviation    Wrist radial deviation    Wrist pronation    Wrist supination     (Blank rows = not tested)  UPPER EXTREMITY MMT: pain with all on testing R side   MMT Right eval Left eval  Shoulder flexion 2- 3  Shoulder extension    Shoulder abduction 2- 3  Shoulder adduction    Shoulder internal rotation 2   Shoulder external rotation 2    Middle trapezius    Lower trapezius    Elbow flexion 4   Elbow extension 4    Wrist flexion    Wrist extension    Wrist ulnar deviation    Wrist radial deviation    Wrist pronation    Wrist supination    Grip strength (lbs)    (Blank rows = not tested)  SHOULDER SPECIAL TESTS:  Impingement tests: Neer impingement test: positive , Hawkins/Kennedy impingement test: positive , and Painful arc test: positive   Rotator cuff assessment: Empty can test: positive   Biceps assessment: Speed's test: positive   JOINT MOBILITY TESTING:  Sharp pain w/abd at 90d, increased muscle guarding with all PROM   PALPATION:  TTP R biceps, tight upper trap   TODAY'S TREATMENT:  03/15/22 UBE L1.6 x 3 min each Seated rows 10# 2x10 Lat pull downs 15# 2x10 Shrugs 2lb 2x10  ER red 2x10  Shoulder flex 2lb 2x10  Scaption 2lb 2x10 Triceps Ext 20lb 2x10 RUE IR red 2x10    03/13/22 UBE L1 x3 min each  Rows Green 2x10 Ext green 2x10  Seated rows 10# 2x10 Lat pull downs 15# 2x10 Shrugs 2lb 2x10 Shoulder ER red 2x1 Shoulder flex 2lb 2x10  Scaption 2lb 2x10 Biceps curls 2lb 2x10    03/09/22 Nustep L4 x91mns  redTB ER/IR 2x10 Seated rows 10# 2x10 Lat pull downs 15# 2x10 Standing flexion 2# 2x10 Shoulder extension 2# 2x10  Scaption with 2# 2x10  Lateral raises 1# 2x10  Shoulder shrugs 2# 2x10    03/05/22 UBE L1 x618ms PROM flexion/ext/IR/ER Supine flexion AAROM 1# WaTE 2x10 redTB rows 2x10 greenTB ext 2x10 redTB ER 2x10 OHP with yellow ball 2x10    02/26/22 NuStep with arms L3 x 6 minutes AAROM with dowell into  shoulder flexion, abd, IR/ER 10 each. Standing shouler ext, Rows, ER against Red Tband resistance 2 x  10 reps of each.  Eval and POC   PATIENT EDUCATION: Education details: POC Person educated: Patient Education method: Explanation Education comprehension: verbalized understanding   HOME EXERCISE PROGRAM: TBD  ASSESSMENT:  CLINICAL IMPRESSION: Pt continues to report increase lumbar and abdomen pain. Cue to control eccentric phase needed with seated rows. Shrugs did cause some shoulder discomfort. Some shoulder fatigue during second set of flexion.    OBJECTIVE IMPAIRMENTS decreased ROM, decreased strength, impaired UE functional use, postural dysfunction, and pain.   ACTIVITY LIMITATIONS carrying, lifting, dressing, reach over head, and hygiene/grooming  PARTICIPATION LIMITATIONS: cleaning, laundry, shopping, and yard work  [employer] POTENTIAL: Good  CLINICAL DECISION MAKING: Stable/uncomplicated  EVALUATION COMPLEXITY: Low   GOALS: Goals reviewed with patient? Yes  SHORT TERM GOALS: Target date: 03/29/22  Patient will be independent with initial HEP.  Goal status: Met   LONG TERM GOALS: Target date: 04/26/22  Patient will be independent with advanced/ongoing HEP to improve outcomes and carryover.  Goal status: INITIAL  2.  Patient will report <2/10 at worst in R shoulder pain to improve QOL.  Goal status: on going  3.  Patient to demonstrate improved upright posture with posterior shoulder girdle engaged to promote improved glenohumeral joint mobility. Goal status: INITIAL  4.  Patient to improve R shoulder AROM to [facility] without pain provocation to allow for increased ease with ADLs.  Goal status: INITIAL  5.  Patient will demonstrate improved functional UE strength by 1 grade in all weak muscle groups.  Goal status: INITIAL  6  Patient will report 23 on FOTO to demonstrate improved functional ability.  Baseline: 48 Goal status: INITIAL  PLAN: PT FREQUENCY: 1-2x/week  PT DURATION: 8 weeks  PLANNED INTERVENTIONS: Therapeutic exercises, Therapeutic activity,  Neuromuscular re-education, Balance training, Gait training, Patient/Family education, Self Care, Joint mobilization, Dry Needling, Electrical stimulation, Cryotherapy, Moist heat, Taping, Vasopneumatic device, Ultrasound, Ionotophoresis 63m/ml Dexamethasone, and Manual therapy  PLAN FOR NEXT SESSION: PROM, AAROM as tolerated, Vaso for pain management    MAndris Baumann DPT 03/15/2022, 11:05 AM

## 2022-03-20 ENCOUNTER — Ambulatory Visit: Payer: Medicare Other | Admitting: Physical Therapy

## 2022-03-20 ENCOUNTER — Encounter: Payer: Self-pay | Admitting: Physical Therapy

## 2022-03-20 DIAGNOSIS — M25511 Pain in right shoulder: Secondary | ICD-10-CM | POA: Diagnosis not present

## 2022-03-20 DIAGNOSIS — R293 Abnormal posture: Secondary | ICD-10-CM

## 2022-03-20 DIAGNOSIS — R600 Localized edema: Secondary | ICD-10-CM

## 2022-03-20 DIAGNOSIS — R279 Unspecified lack of coordination: Secondary | ICD-10-CM

## 2022-03-20 DIAGNOSIS — M25611 Stiffness of right shoulder, not elsewhere classified: Secondary | ICD-10-CM

## 2022-03-20 DIAGNOSIS — M6281 Muscle weakness (generalized): Secondary | ICD-10-CM

## 2022-03-20 NOTE — Therapy (Signed)
OUTPATIENT PHYSICAL THERAPY SHOULDER TREATMENT   Patient Name: Maureen Barnes MRN: [id number] DOB:August 31, 1960, 61 y.o., female Today's Date: 03/20/2022   PT End of Session - 03/20/22 1231     Visit Number 7    Date for PT Re-Evaluation 04/26/22    PT Start Time 5638    PT Stop Time 1310    PT Time Calculation (min) 40 min    Activity Tolerance Patient tolerated treatment well    Behavior During Therapy [facility] for tasks assessed/performed                Past Medical History:  Diagnosis Date   Anemia    Cervical disc disease    Chronic kidney disease (CKD), stage V ([place])    Diabetes mellitus    Hep C w/o coma, chronic ([place])    Hypertension    Lumbar disc disease    Peritoneal dialysis status ([place])    Renal disorder    Past Surgical History:  Procedure Laterality Date   TUBAL LIGATION     Patient Active Problem List   Diagnosis Date Noted   Anxiety 07/17/2018   ESRD on peritoneal dialysis ([place]) 07/17/2018   H/O cesarean section 07/17/2018   Evaluation by medical service required 04/02/2018   Pre-transplant evaluation for end stage renal disease 11/18/2017   Unilateral inguinal hernia 11/18/2017   Hyperlipidemia 01/22/2017   Palpitations 01/22/2017   Dyspnea on exertion 01/22/2017   CKD (chronic kidney disease) stage 3, GFR 30-59 ml/min ([place]) 10/18/2016   Non-compliance 10/18/2016   Chronic ethmoidal sinusitis 10/18/2016   Hepatitis C, chronic ([place]) 05/15/2012   Diabetes mellitus type 2, insulin dependent ([place]) 05/15/2012   Hypertension 05/15/2012   Anemia 05/15/2012    PCP: Eldridge Abrahams  REFERRING PROVIDER: Earlie Server  REFERRING DIAG: s/p shoulder scope  THERAPY DIAG:  Stiffness of right shoulder, not elsewhere classified  Acute pain of right shoulder  Muscle weakness (generalized)  Abnormal posture  Localized edema  Unspecified lack of coordination  Rationale for Evaluation and Treatment Rehabilitation  ONSET DATE:  10/18/21  SUBJECTIVE:                                                                                                                                                                                      SUBJECTIVE STATEMENT: Shoulder is good. She has increased pain when she places her insulin pod on her R arm. She still has some pain in her back  PERTINENT HISTORY: R shoulder scope, DM, HTN, CKD  PAIN:  Are you having pain? Yes: NPRS scale: 8/10 Pain location: abdomen and back Pain description: sore Aggravating factors: opening doors, jars, bottles, reaching  up, lifting objects Relieving factors: ice, heat, stretching, medications  PRECAUTIONS: None  WEIGHT BEARING RESTRICTIONS No  FALLS:  Has patient fallen in last 6 months? No  LIVING ENVIRONMENT: Lives with: lives alone Lives in: House/apartment Stairs: Yes: Internal: 14 steps; can reach both Has following equipment at home: Single point cane, Walker - 2 wheeled, and bed side commode  OCCUPATION: On disability   PLOF: Independent  PATIENT GOALS to be able to reach overhead, have no more pain   OBJECTIVE:   DIAGNOSTIC FINDINGS:  IMPRESSION: Calcific tendinosis of the distal supraspinatus and infraspinatus tendons with compact 2.2 cm calcific deposit along the bursal surface at the footprint and associated subacromial-subdeltoid bursitis. There is bursal sided fraying and probable low-grade tearing of the distal supraspinatus infraspinatus tendons at the footprint.   Mild AC joint arthropathy.  PATIENT SURVEYS:  FOTO 48/100  COGNITION:  Overall cognitive status: Within functional limits for tasks assessed     SENSATION: WFL  POSTURE: Rounded head, forward head   UPPER EXTREMITY ROM:   Active ROM Right eval Left eval  Shoulder flexion 120   Shoulder extension    Shoulder abduction 90 w/pain   Shoulder adduction    Shoulder internal rotation WFL pain at end range   Shoulder external rotation 25    Elbow flexion WNL   Elbow extension WNL   Wrist flexion    Wrist extension    Wrist ulnar deviation    Wrist radial deviation    Wrist pronation    Wrist supination    (Blank rows = not tested)  UPPER EXTREMITY MMT: pain with all on testing R side   MMT Right eval Left eval  Shoulder flexion 2- 3  Shoulder extension    Shoulder abduction 2- 3  Shoulder adduction    Shoulder internal rotation 2   Shoulder external rotation 2    Middle trapezius    Lower trapezius    Elbow flexion 4   Elbow extension 4    Wrist flexion    Wrist extension    Wrist ulnar deviation    Wrist radial deviation    Wrist pronation    Wrist supination    Grip strength (lbs)    (Blank rows = not tested)  SHOULDER SPECIAL TESTS:  Impingement tests: Neer impingement test: positive , Hawkins/Kennedy impingement test: positive , and Painful arc test: positive   Rotator cuff assessment: Empty can test: positive   Biceps assessment: Speed's test: positive   JOINT MOBILITY TESTING:  Sharp pain w/abd at 90d, increased muscle guarding with all PROM   PALPATION:  TTP R biceps, tight upper trap   TODAY'S TREATMENT:  03/20/22 UBE L1.6 x 3 minutes each way. Supine/hooklying bridge with pelvic tilt, 10 reps Supine pelvic tilt with march 5 each leg. Tight ITB-ITB stretch B HS stretch, figure 4 stretch B side step against Red Tband at the knees, 2 x 10 each direction Shoulder ext, rows, ER with Red Tband resistance 2 x 10 reps each   03/15/22 UBE L1.6 x 3 min each Seated rows 10# 2x10 Lat pull downs 15# 2x10 Shrugs 2lb 2x10  ER red 2x10  Shoulder flex 2lb 2x10  Scaption 2lb 2x10 Triceps Ext 20lb 2x10 RUE IR red 2x10    03/13/22 UBE L1 x3 min each  Rows Green 2x10 Ext green 2x10  Seated rows 10# 2x10 Lat pull downs 15# 2x10 Shrugs 2lb 2x10 Shoulder ER red 2x1 Shoulder flex 2lb 2x10  Scaption 2lb 2x10 Biceps  curls 2lb 2x10    03/09/22 Nustep L4 x29mns  redTB ER/IR 2x10 Seated rows  10# 2x10 Lat pull downs 15# 2x10 Standing flexion 2# 2x10 Shoulder extension 2# 2x10  Scaption with 2# 2x10  Lateral raises 1# 2x10  Shoulder shrugs 2# 2x10    03/05/22 UBE L1 x648ms PROM flexion/ext/IR/ER Supine flexion AAROM 1# WaTE 2x10 redTB rows 2x10 greenTB ext 2x10 redTB ER 2x10 OHP with yellow ball 2x10    02/26/22 NuStep with arms L3 x 6 minutes AAROM with dowell into shoulder flexion, abd, IR/ER 10 each. Standing shouler ext, Rows, ER against Red Tband resistance 2 x 10 reps of each.  Eval and POC   PATIENT EDUCATION: Education details: POC Person educated: Patient Education method: Explanation Education comprehension: verbalized understanding   HOME EXERCISE PROGRAM: [id number]  ASSESSMENT:  CLINICAL IMPRESSION: Pt with multiple areas of pain including low back, neck, shoulder. ITB very tight and painful B. Moved through stretching and strengthening for trunk and postural control to help address overall instability.   OBJECTIVE IMPAIRMENTS decreased ROM, decreased strength, impaired UE functional use, postural dysfunction, and pain.   ACTIVITY LIMITATIONS carrying, lifting, dressing, reach over head, and hygiene/grooming  PARTICIPATION LIMITATIONS: cleaning, laundry, shopping, and yard work  [employer]OTENTIAL: Good  CLINICAL DECISION MAKING: Stable/uncomplicated  EVALUATION COMPLEXITY: Low   GOALS: Goals reviewed with patient? Yes  SHORT TERM GOALS: Target date: 03/29/22  Patient will be independent with initial HEP.  Goal status: Met   LONG TERM GOALS: Target date: 04/26/22  Patient will be independent with advanced/ongoing HEP to improve outcomes and carryover.  Goal status: INITIAL  2.  Patient will report <2/10 at worst in R shoulder pain to improve QOL.  Goal status: on going  3.  Patient to demonstrate improved upright posture with posterior shoulder girdle engaged to promote improved glenohumeral joint mobility. Goal status:  INITIAL  4.  Patient to improve R shoulder [facility] pain provocation to allow for increased ease with ADLs.  Goal status: INITIAL  5.  Patient will demonstrate improved functional UE strength by 1 grade in all weak muscle groups.  Goal status: INITIAL  6  Patient will report 6288n FOTO to demonstrate improved functional ability.  Baseline: 48 Goal status: INITIAL  PLAN: PT FREQUENCY: 1-2x/week  PT DURATION: 8 weeks  PLANNED INTERVENTIONS: Therapeutic exercises, Therapeutic activity, Neuromuscular re-education, Balance training, Gait training, Patient/Family education, Self Care, Joint mobilization, Dry Needling, Electrical stimulation, Cryotherapy, Moist heat, Taping, Vasopneumatic device, Ultrasound, Ionotophoresis 57m67ml Dexamethasone, and Manual therapy  PLAN FOR NEXT SESSION: Assess HEP tolerance, add shoulder strengthening.   SusEthel RanaT 03/20/22 1:13 PM  03/20/2022, 1:13 PM

## 2022-03-21 ENCOUNTER — Ambulatory Visit (HOSPITAL_COMMUNITY): Payer: Medicare Other | Attending: Cardiology

## 2022-03-21 ENCOUNTER — Ambulatory Visit (HOSPITAL_BASED_OUTPATIENT_CLINIC_OR_DEPARTMENT_OTHER): Payer: Medicare Other

## 2022-03-21 ENCOUNTER — Ambulatory Visit: Payer: Medicare Other | Admitting: Cardiovascular Disease

## 2022-03-21 DIAGNOSIS — R0609 Other forms of dyspnea: Secondary | ICD-10-CM

## 2022-03-21 DIAGNOSIS — R072 Precordial pain: Secondary | ICD-10-CM

## 2022-03-21 LAB — MYOCARDIAL PERFUSION IMAGING
Base ST Depression (mm): 0 mm
LV dias vol: 57 mL (ref 46–106)
LV sys vol: 18 mL
Nuc Stress EF: 68 %
Peak HR: 100 {beats}/min
Rest HR: 69 {beats}/min
Rest Nuclear Isotope Dose: 10 mCi
SDS: 0
SRS: 0
SSS: 0
ST Depression (mm): 0 mm
Stress Nuclear Isotope Dose: 31.8 mCi
TID: 0.91

## 2022-03-21 LAB — ECHOCARDIOGRAM COMPLETE
Area-P 1/2: 3.33 cm2
Height: 63 in
P 1/2 time: 475 msec
S' Lateral: 2.8 cm
Weight: 2832 oz

## 2022-03-21 MED ORDER — TECHNETIUM TC 99M TETROFOSMIN IV KIT
31.8000 | PACK | Freq: Once | INTRAVENOUS | Status: AC | PRN
  Administered 2022-03-21: 31.8 via INTRAVENOUS

## 2022-03-21 MED ORDER — REGADENOSON 0.4 MG/5ML IV SOLN
0.4000 mg | Freq: Once | INTRAVENOUS | Status: AC
  Administered 2022-03-21: 0.4 mg via INTRAVENOUS

## 2022-03-21 MED ORDER — TECHNETIUM TC 99M TETROFOSMIN IV KIT
10.0000 | PACK | Freq: Once | INTRAVENOUS | Status: AC | PRN
  Administered 2022-03-21: 10 via INTRAVENOUS

## 2022-03-22 ENCOUNTER — Encounter: Payer: Self-pay | Admitting: Physical Therapy

## 2022-03-22 ENCOUNTER — Ambulatory Visit: Payer: Medicare Other | Admitting: Physical Therapy

## 2022-03-22 ENCOUNTER — Encounter (HOSPITAL_COMMUNITY): Payer: Self-pay

## 2022-03-22 DIAGNOSIS — M25511 Pain in right shoulder: Secondary | ICD-10-CM | POA: Diagnosis not present

## 2022-03-22 DIAGNOSIS — M6281 Muscle weakness (generalized): Secondary | ICD-10-CM

## 2022-03-22 DIAGNOSIS — R293 Abnormal posture: Secondary | ICD-10-CM

## 2022-03-22 DIAGNOSIS — M25611 Stiffness of right shoulder, not elsewhere classified: Secondary | ICD-10-CM

## 2022-03-22 NOTE — Therapy (Signed)
OUTPATIENT PHYSICAL THERAPY SHOULDER TREATMENT   Patient Name: Maureen Barnes MRN: [id number] DOB:[birth year], 61 y.o., female Today's Date: 03/22/2022   PT End of Session - 03/22/22 1302     Visit Number 8    Date for PT Re-Evaluation 04/26/22    PT Start Time 1300    PT Stop Time 2778    PT Time Calculation (min) 45 min    Activity Tolerance Patient tolerated treatment well    Behavior During Therapy [facility] for tasks assessed/performed                Past Medical History:  Diagnosis Date   Anemia    Cervical disc disease    Chronic kidney disease (CKD), stage V ([place])    Diabetes mellitus    Hep C w/o coma, chronic ([place])    Hypertension    Lumbar disc disease    Peritoneal dialysis status ([place])    Renal disorder    Past Surgical History:  Procedure Laterality Date   TUBAL LIGATION     Patient Active Problem List   Diagnosis Date Noted   Anxiety 07/17/2018   ESRD on peritoneal dialysis ([place]) 07/17/2018   H/O cesarean section 07/17/2018   Evaluation by medical service required 04/02/2018   Pre-transplant evaluation for end stage renal disease 11/18/2017   Unilateral inguinal hernia 11/18/2017   Hyperlipidemia 01/22/2017   Palpitations 01/22/2017   Dyspnea on exertion 01/22/2017   CKD (chronic kidney disease) stage 3, GFR 30-59 ml/min ([place]) 10/18/2016   Non-compliance 10/18/2016   Chronic ethmoidal sinusitis 10/18/2016   Hepatitis C, chronic ([place]) 05/15/2012   Diabetes mellitus type 2, insulin dependent ([place]) 05/15/2012   Hypertension 05/15/2012   Anemia 05/15/2012    PCP: Eldridge Abrahams  REFERRING PROVIDER: Earlie Server  REFERRING DIAG: s/p shoulder scope  THERAPY DIAG:  Stiffness of right shoulder, not elsewhere classified  Acute pain of right shoulder  Muscle weakness (generalized)  Abnormal posture  Rationale for Evaluation and Treatment Rehabilitation  ONSET DATE: 10/18/21  SUBJECTIVE:                                                                                                                                                                                       SUBJECTIVE STATEMENT: Back is a little than last week, shoulder is doing good she thinks.  PERTINENT HISTORY: R shoulder scope, DM, HTN, CKD  PAIN:  Are you having pain? Yes: NPRS scale: 0/10 Pain location: abdomen and back Pain description: sore Aggravating factors: opening doors, jars, bottles, reaching up, lifting objects Relieving factors: ice, heat, stretching, medications  PRECAUTIONS: None  WEIGHT BEARING RESTRICTIONS No  FALLS:  Has patient fallen in last 6 months? No  LIVING ENVIRONMENT: Lives with: lives alone Lives in: House/apartment Stairs: Yes: Internal: 14 steps; can reach both Has following equipment at home: Single point cane, Walker - 2 wheeled, and bed side commode  OCCUPATION: On disability   PLOF: Independent  PATIENT GOALS to be able to reach overhead, have no more pain   OBJECTIVE:   DIAGNOSTIC FINDINGS:  IMPRESSION: Calcific tendinosis of the distal supraspinatus and infraspinatus tendons with compact 2.2 cm calcific deposit along the bursal surface at the footprint and associated subacromial-subdeltoid bursitis. There is bursal sided fraying and probable low-grade tearing of the distal supraspinatus infraspinatus tendons at the footprint.   Mild AC joint arthropathy.  PATIENT SURVEYS:  FOTO 48/100  COGNITION:  Overall cognitive status: Within functional limits for tasks assessed     SENSATION: WFL  POSTURE: Rounded head, forward head   UPPER EXTREMITY ROM:   Active ROM Right eval Left eval  Shoulder flexion 120   Shoulder extension    Shoulder abduction 90 w/pain   Shoulder adduction    Shoulder internal rotation WFL pain at end range   Shoulder external rotation 25   Elbow flexion WNL   Elbow extension WNL   Wrist flexion    Wrist extension    Wrist ulnar deviation    Wrist radial  deviation    Wrist pronation    Wrist supination    (Blank rows = not tested)  UPPER EXTREMITY MMT: pain with all on testing R side   MMT Right eval Left eval  Shoulder flexion 2- 3  Shoulder extension    Shoulder abduction 2- 3  Shoulder adduction    Shoulder internal rotation 2   Shoulder external rotation 2    Middle trapezius    Lower trapezius    Elbow flexion 4   Elbow extension 4    Wrist flexion    Wrist extension    Wrist ulnar deviation    Wrist radial deviation    Wrist pronation    Wrist supination    Grip strength (lbs)    (Blank rows = not tested)  SHOULDER SPECIAL TESTS:  Impingement tests: Neer impingement test: positive , Hawkins/Kennedy impingement test: positive , and Painful arc test: positive   Rotator cuff assessment: Empty can test: positive   Biceps assessment: Speed's test: positive   JOINT MOBILITY TESTING:  Sharp pain w/abd at 90d, increased muscle guarding with all PROM   PALPATION:  TTP R biceps, tight upper trap   TODAY'S TREATMENT:  03/22/22 UBE 1.5 x 3 min each  Seated ER red 2x10 Horiz Abd yellow 2x10 Flexion 2lb 2x10 Shoulder abd 1lb 2x10  Rows 10lb 2x10 Lats 15lb 2x10 Shoulder Ext 5lb 2x10  03/20/22 UBE L1.6 x 3 minutes each way. Supine/hooklying bridge with pelvic tilt, 10 reps Supine pelvic tilt with march 5 each leg. Tight ITB-ITB stretch B HS stretch, figure 4 stretch B side step against Red Tband at the knees, 2 x 10 each direction Shoulder ext, rows, ER with Red Tband resistance 2 x 10 reps each   03/15/22 UBE L1.6 x 3 min each Seated rows 10# 2x10 Lat pull downs 15# 2x10 Shrugs 2lb 2x10  ER red 2x10  Shoulder flex 2lb 2x10  Scaption 2lb 2x10 Triceps Ext 20lb 2x10 RUE IR red 2x10    03/13/22 UBE L1 x3 min each  Rows Green 2x10 Ext green 2x10  Seated rows 10# 2x10 Lat pull downs 15# 2x10 Shrugs  2lb 2x10 Shoulder ER red 2x1 Shoulder flex 2lb 2x10  Scaption 2lb 2x10 Biceps curls 2lb  2x10    03/09/22 Nustep L4 x26mns  redTB ER/IR 2x10 Seated rows 10# 2x10 Lat pull downs 15# 2x10 Standing flexion 2# 2x10 Shoulder extension 2# 2x10  Scaption with 2# 2x10  Lateral raises 1# 2x10  Shoulder shrugs 2# 2x10   PATIENT EDUCATION: Education details: POC Person educated: Patient Education method: Explanation Education comprehension: verbalized understanding   HOME EXERCISE PROGRAM: [id number]  ASSESSMENT:  CLINICAL IMPRESSION: Pt enters with reports of improvement overall. Pt stated that she is having less pan in her low back and shoulder Session continued with a progression with postural strength. Some shoulder fatigue reported with horizontal abduction. Cues to keep elbows to side with ER. Some weakness present with shoulder Ext.   OBJECTIVE IMPAIRMENTS decreased ROM, decreased strength, impaired UE functional use, postural dysfunction, and pain.   ACTIVITY LIMITATIONS carrying, lifting, dressing, reach over head, and hygiene/grooming  PARTICIPATION LIMITATIONS: cleaning, laundry, shopping, and yard work  [employer]OTENTIAL: Good  CLINICAL DECISION MAKING: Stable/uncomplicated  EVALUATION COMPLEXITY: Low   GOALS: Goals reviewed with patient? Yes  SHORT TERM GOALS: Target date: 03/29/22  Patient will be independent with initial HEP.  Goal status: Met   LONG TERM GOALS: Target date: 04/26/22  Patient will be independent with advanced/ongoing HEP to improve outcomes and carryover.  Goal status: INITIAL  2.  Patient will report <2/10 at worst in R shoulder pain to improve QOL.  Goal status: on going  3.  Patient to demonstrate improved upright posture with posterior shoulder girdle engaged to promote improved glenohumeral joint mobility. Goal status: INITIAL  4.  Patient to improve R shoulder [facility] prov[facility] to allow for increased ease with ADLs.  Goal status: INITIAL  5.  Patient will demonstrate improved functional UE strength  by 1 grade in all weak muscle groups.  Goal status: INITIAL  6  Patient will report 647on FOTO to demonstrate improved functional ability.  Baseline: 48 Goal status: INITIAL  PLAN: PT FREQUENCY: 1-2x/week  PT DURATION: 8 weeks  PLANNED INTERVENTIONS: Therapeutic exercises, Therapeutic activity, Neuromuscular re-education, Balance training, Gait training, Patient/Family education, Self Care, Joint mobilization, Dry Needling, Electrical stimulation, Cryotherapy, Moist heat, Taping, Vasopneumatic device, Ultrasound, Ionotophoresis 475mml Dexamethasone, and Manual therapy  PLAN FOR NEXT SESSION: Assess HEP tolerance, add shoulder strengthening.   SuEthel RanaPT 03/22/22 1:03 PM  03/22/2022, 1:03 PM

## 2022-03-27 ENCOUNTER — Ambulatory Visit (HOSPITAL_BASED_OUTPATIENT_CLINIC_OR_DEPARTMENT_OTHER): Payer: Medicare Other | Admitting: Internal Medicine

## 2022-04-02 ENCOUNTER — Ambulatory Visit: Payer: Medicaid Other | Attending: Orthopedic Surgery | Admitting: Physical Therapy

## 2022-04-02 ENCOUNTER — Encounter: Payer: Self-pay | Admitting: Physical Therapy

## 2022-04-02 DIAGNOSIS — R293 Abnormal posture: Secondary | ICD-10-CM | POA: Diagnosis present

## 2022-04-02 DIAGNOSIS — M25511 Pain in right shoulder: Secondary | ICD-10-CM | POA: Insufficient documentation

## 2022-04-02 DIAGNOSIS — M6281 Muscle weakness (generalized): Secondary | ICD-10-CM | POA: Diagnosis present

## 2022-04-02 DIAGNOSIS — M25611 Stiffness of right shoulder, not elsewhere classified: Secondary | ICD-10-CM | POA: Diagnosis present

## 2022-04-02 DIAGNOSIS — R600 Localized edema: Secondary | ICD-10-CM | POA: Diagnosis present

## 2022-04-02 NOTE — Therapy (Signed)
OUTPATIENT PHYSICAL THERAPY SHOULDER TREATMENT   Patient Name: Maureen Barnes MRN: [id number] DOB:March 23, 1961, 61 y.o., female Today's Date: 04/02/2022   PT End of Session - 04/02/22 1150     Visit Number 9    Date for PT Re-Evaluation 04/26/22    PT Start Time 1150    PT Stop Time 1230    PT Time Calculation (min) 40 min    Activity Tolerance Patient tolerated treatment well    Behavior During Therapy [facility] for tasks assessed/performed                Past Medical History:  Diagnosis Date   Anemia    Cervical disc disease    Chronic kidney disease (CKD), stage V ([place])    Diabetes mellitus    Hep C w/o coma, chronic ([place])    Hypertension    Lumbar disc disease    Peritoneal dialysis status ([place])    Renal disorder    Past Surgical History:  Procedure Laterality Date   TUBAL LIGATION     Patient Active Problem List   Diagnosis Date Noted   Anxiety 07/17/2018   ESRD on peritoneal dialysis ([place]) 07/17/2018   H/O cesarean section 07/17/2018   Evaluation by medical service required 04/02/2018   Pre-transplant evaluation for end stage renal disease 11/18/2017   Unilateral inguinal hernia 11/18/2017   Hyperlipidemia 01/22/2017   Palpitations 01/22/2017   Dyspnea on exertion 01/22/2017   CKD (chronic kidney disease) stage 3, GFR 30-59 ml/min ([facility]) 10/18/2016   Non-compliance 10/18/2016   Chronic ethmoidal sinusitis 10/18/2016   Hepatitis C, chronic ([place]) 05/15/2012   Diabetes mellitus type 2, insulin dependent ([place]) 05/15/2012   Hypertension 05/15/2012   Anemia 05/15/2012    PCP: Eldridge Abrahams  REFERRING PROVIDER: Earlie Server  REFERRING DIAG: s/p shoulder scope  THERAPY DIAG:  Stiffness of right shoulder, not elsewhere classified  Acute pain of right shoulder  Muscle weakness (generalized)  Abnormal posture  Localized edema  Rationale for Evaluation and Treatment Rehabilitation  ONSET DATE: 10/18/21  SUBJECTIVE:                                                                                                                                                                                       SUBJECTIVE STATEMENT: Doing ok  PERTINENT HISTORY: R shoulder scope, DM, HTN, CKD  PAIN:  Are you having pain? Yes: NPRS scale: 0/10 Pain location: abdomen and back Pain description: sore Aggravating factors: opening doors, jars, bottles, reaching up, lifting objects Relieving factors: ice, heat, stretching, medications  PRECAUTIONS: None  WEIGHT BEARING RESTRICTIONS No  FALLS:  Has patient fallen in last 6 months? No  LIVING ENVIRONMENT: Lives with: lives alone Lives in: House/apartment Stairs: Yes: Internal: 14 steps; can reach both Has following equipment at home: Single point cane, Walker - 2 wheeled, and bed side commode  OCCUPATION: On disability   PLOF: Independent  PATIENT GOALS to be able to reach overhead, have no more pain   OBJECTIVE:   DIAGNOSTIC FINDINGS:  IMPRESSION: Calcific tendinosis of the distal supraspinatus and infraspinatus tendons with compact 2.2 cm calcific deposit along the bursal surface at the footprint and associated subacromial-subdeltoid bursitis. There is bursal sided fraying and probable low-grade tearing of the distal supraspinatus infraspinatus tendons at the footprint.   Mild AC joint arthropathy.  PATIENT SURVEYS:  FOTO 48/100  COGNITION:  Overall cognitive status: Within functional limits for tasks assessed     SENSATION: WFL  POSTURE: Rounded head, forward head   UPPER EXTREMITY ROM:   Active ROM Right eval Right UE 04/02/22  Shoulder flexion 120 138  Shoulder extension    Shoulder abduction 90 w/pain 105  Shoulder adduction    Shoulder internal rotation WFL pain at end range   Shoulder external rotation 25 60  Elbow flexion WNL   Elbow extension WNL   Wrist flexion    Wrist extension    Wrist ulnar deviation    Wrist radial deviation    Wrist pronation     Wrist supination    (Blank rows = not tested)  UPPER EXTREMITY MMT: pain with all on testing R side   MMT Right eval Left eval  Shoulder flexion 2- 3  Shoulder extension    Shoulder abduction 2- 3  Shoulder adduction    Shoulder internal rotation 2   Shoulder external rotation 2    Middle trapezius    Lower trapezius    Elbow flexion 4   Elbow extension 4    Wrist flexion    Wrist extension    Wrist ulnar deviation    Wrist radial deviation    Wrist pronation    Wrist supination    Grip strength (lbs)    (Blank rows = not tested)  SHOULDER SPECIAL TESTS:  Impingement tests: Neer impingement test: positive , Hawkins/Kennedy impingement test: positive , and Painful arc test: positive   Rotator cuff assessment: Empty can test: positive   Biceps assessment: Speed's test: positive   JOINT MOBILITY TESTING:  Sharp pain w/abd at 90d, increased muscle guarding with all PROM   PALPATION:  TTP R biceps, tight upper trap   TODAY'S TREATMENT:  04/02/22 UBE L2.9 x3 min each  Rows 15lb 2x10  Lats 15lb 2x10 Shoulder Ext 5lb 2x10 ER red 2x10 Shoulder Flex 2lb 2x10 Shoulder Abd 1lb 2x10 OHP 1lb WaTE 2x10   03/22/22 UBE 1.5 x 3 min each  Seated ER red 2x10 Horiz Abd yellow 2x10 Flexion 2lb 2x10 Shoulder abd 1lb 2x10  Rows 10lb 2x10 Lats 15lb 2x10 Shoulder Ext 5lb 2x10  03/20/22 UBE L1.6 x 3 minutes each way. Supine/hooklying bridge with pelvic tilt, 10 reps Supine pelvic tilt with march 5 each leg. Tight ITB-ITB stretch B HS stretch, figure 4 stretch B side step against Red Tband at the knees, 2 x 10 each direction Shoulder ext, rows, ER with Red Tband resistance 2 x 10 reps each   03/15/22 UBE L1.6 x 3 min each Seated rows 10# 2x10 Lat pull downs 15# 2x10 Shrugs 2lb 2x10  ER red 2x10  Shoulder flex 2lb 2x10  Scaption 2lb 2x10 Triceps Ext 20lb 2x10 RUE IR red 2x10  PATIENT EDUCATION: Education details: POC Person educated: Patient Education method:  Explanation Education comprehension: verbalized understanding   HOME EXERCISE PROGRAM: [id number]  ASSESSMENT:  CLINICAL IMPRESSION: Pt enters with reports of improvement overall. She has progressed meeting most of her LTG's. Pt stated that she is having less pan in her low back and shoulder Session continued with a progression with postural strength. Some R shoulder popping reported with seated rows and standing OHP.    OBJECTIVE IMPAIRMENTS decreased ROM, decreased strength, impaired UE functional use, postural dysfunction, and pain.   ACTIVITY LIMITATIONS carrying, lifting, dressing, reach over head, and hygiene/grooming  PARTICIPATION LIMITATIONS: cleaning, laundry, shopping, and yard work  [employer] POTENTIAL: Good  CLINICAL DECISION MAKING: Stable/uncomplicated  EVALUATION COMPLEXITY: Low   GOALS: Goals reviewed with patient? Yes  SHORT TERM GOALS: Target date: 03/29/22  Patient will be independent with initial HEP.  Goal status: Met   LONG TERM GOALS: Target date: 04/26/22  Patient will be independent with advanced/ongoing HEP to improve outcomes and carryover.  Goal status: Met  2.  Patient will report <2/10 at worst in R shoulder pain to improve QOL.  Goal status: Met  3.  Patient to demonstrate improved upright posture with posterior shoulder girdle engaged to promote improved glenohumeral joint mobility. Goal status: Met  4.  Patient to improve R shoulder AROM to [facility] without pain provocation to allow for increased ease with ADLs.  Goal status: INITIAL  5.  Patient will demonstrate improved functional UE strength by 1 grade in all weak muscle groups.  Goal status: on going  6  Patient will report 59 on FOTO to demonstrate improved functional ability.  Baseline: 48 Goal status: Met  PLAN: PT FREQUENCY: 1-2x/week  PT DURATION: 8 weeks  PLANNED INTERVENTIONS: Therapeutic exercises, Therapeutic activity, Neuromuscular re-education, Balance training, Gait  training, Patient/Family education, Self Care, Joint mobilization, Dry Needling, Electrical stimulation, Cryotherapy, Moist heat, Taping, Vasopneumatic device, Ultrasound, Ionotophoresis 67m/ml Dexamethasone, and Manual therapy  PLAN FOR NEXT SESSION: Assess HEP tolerance, add shoulder strengthening.   SEthel RanaDPT 04/02/22 11:51 AM  04/02/2022, 11:51 AM

## 2022-04-05 ENCOUNTER — Ambulatory Visit: Payer: Medicaid Other | Admitting: Physical Therapy

## 2022-04-10 ENCOUNTER — Ambulatory Visit: Payer: Medicaid Other | Admitting: Physical Therapy

## 2022-04-12 ENCOUNTER — Ambulatory Visit: Payer: Medicaid Other | Admitting: Physical Therapy

## 2022-04-16 ENCOUNTER — Ambulatory Visit: Payer: Medicaid Other | Admitting: Physical Therapy

## 2022-04-17 ENCOUNTER — Ambulatory Visit: Payer: Medicaid Other | Attending: Physician Assistant | Admitting: Physician Assistant

## 2022-04-17 ENCOUNTER — Encounter: Payer: Self-pay | Admitting: Physician Assistant

## 2022-04-17 VITALS — BP 126/72 | HR 85 | Ht 63.0 in | Wt 182.6 lb

## 2022-04-17 DIAGNOSIS — R0789 Other chest pain: Secondary | ICD-10-CM

## 2022-04-17 DIAGNOSIS — Z940 Kidney transplant status: Secondary | ICD-10-CM

## 2022-04-17 DIAGNOSIS — E119 Type 2 diabetes mellitus without complications: Secondary | ICD-10-CM

## 2022-04-17 DIAGNOSIS — I10 Essential (primary) hypertension: Secondary | ICD-10-CM

## 2022-04-17 DIAGNOSIS — E785 Hyperlipidemia, unspecified: Secondary | ICD-10-CM

## 2022-04-17 NOTE — Patient Instructions (Signed)
Medication Instructions:  Your physician recommends that you continue on your current medications as directed. Please refer to the Current Medication list given to you today.  *If you need a refill on your cardiac medications before your next appointment, please call your pharmacy*   Lab Work: NONE  If you have labs (blood work) drawn today and your tests are completely normal, you will receive your results only by: [place] (if you have MyChart) OR A paper copy in the mail If you have any lab test that is abnormal or we need to change your treatment, we will call you to review the results.   Testing/Procedures: NONE   Follow-Up: At [facility], you and your health needs are our priority.  As part of our continuing mission to provide you with exceptional heart care, we have created designated Provider Care Teams.  These Care Teams include your primary Cardiologist (physician) and Advanced Practice Providers (APPs -  Physician Assistants and Nurse Practitioners) who all work together to provide you with the care you need, when you need it.  We recommend signing up for the patient portal called "MyChart".  Sign up information is provided on this After Visit Summary.  MyChart is used to connect with patients for Virtual Visits (Telemedicine).  Patients are able to view lab/test results, encounter notes, upcoming appointments, etc.  Non-urgent messages can be sent to your provider as well.   To learn more about what you can do with MyChart, go to [email or web address].    Other Instructions Please keep upcoming appointment with Dr.Hilty   Important Information About [place]

## 2022-04-17 NOTE — Progress Notes (Signed)
Cardiology Office Note:    Date:  04/19/2022   ID:  Maureen Barnes, DOB 1961/04/21, MRN [id number]  PCP:  Berkley Harvey, NP   [facility] Cardiologist:  Quay Burow, MD     Referring MD: Berkley Harvey, NP   Chief Complaint  Patient presents with   Follow-up    Seen for Dr. Gwenlyn Found    History of Present Illness:    Maureen Barnes is a 61 y.o. female with a hx of HTN, HLD, DM 2, ESRD s/p renal transplant 02/28/2019 on chronic immunosuppression with Prograf and prednisone, hepatitis C s/p treatment in 2012 and palpitation.  Echocardiogram obtained in July 2018 showed EF 60 to 65%, no regional wall motion abnormality, grade 2 DD, mild MR.  She had significant hypotension on carvedilol in the past and was switched to atenolol.  Echocardiogram obtained on 01/20/2020 showed EF 60 to 65%, normal LV wall thickness, no significant valve issue.  I last saw the patient on 01/06/2019 for evaluation palpitation.  She typically has pounding sensation in the morning when she wake up, I increased her Toprol-XL to 50 mg a.m. and 25 mg p.m.  3-day heart monitor shows normal sinus rhythm without significant irregular rhythm.  PVC burden less than 1%.  Patient was later referred to Dr. Debara Pickett for evaluation of hyperlipidemia.  Zetia was recommended, however she did not never started this medication due to concern for GI side effects.  More recently, patient went to [facility] urgent care on 02/19/2022 for evaluation of dyspnea and palpitation.  She also complained of heartburn and right shoulder pain.  She was instructed to seek urgent medical attention in the emergency room, however she did not go.  I last saw the patient on 03/06/2022, I recommended echocardiogram and Myoview.  Echocardiogram obtained on 03/21/2022 showed EF 55 to 60%, no regional wall motion abnormality, grade 1 DD, RVSP 32.4 mmHg, trivial AI.  Myoview obtained on 03/21/2022 showed EF 68%, no evidence of ischemia or infarction.   Overall low risk study.  Patient presents today for follow-up.  She is doing quite well without any chest pain or worsening dyspnea.  She does not appears to be volume overloaded.  After I reduced her amlodipine to 2.5 mg daily during the last office visit, her blood pressure has improved and that she is feeling well.  I recommended continue on the current therapy.  Past Medical History:  Diagnosis Date   Anemia    Cervical disc disease    Chronic kidney disease (CKD), stage V (HCC)    Diabetes mellitus    Hep C w/o coma, chronic (HCC)    Hypertension    Lumbar disc disease    Peritoneal dialysis status ([place])    Renal disorder     Past Surgical History:  Procedure Laterality Date   TUBAL LIGATION      Current Medications: Current Meds  Medication Sig   amLODipine (NORVASC) 2.5 MG tablet Take 1 tablet (2.5 mg total) by mouth daily.   aspirin EC 81 MG tablet Take 81 mg by mouth daily.   atorvastatin (LIPITOR) 20 MG tablet Take 1 tablet by mouth at bedtime.   b complex-vitamin c-folic acid (NEPHRO-VITE) 0.8 MG TABS tablet Take 1 tablet by mouth daily.   chlorthalidone (HYGROTON) 25 MG tablet Take 12.5 mg by mouth daily.   ciclopirox (PENLAC) 8 % solution Apply topically at bedtime. Apply over nail and surrounding skin. Apply daily over previous coat. After  seven (7) days, may remove with alcohol and continue cycle.   Continuous Blood Gluc Receiver (DEXCOM G6 RECEIVER) DEVI Use as directed for continuous glucose monitoring.   Continuous Blood Gluc Sensor (DEXCOM G6 SENSOR) MISC Inject 1 sensor to the skin every 10 days for continuous glucose monitoring.   Continuous Blood Gluc Transmit (DEXCOM G6 TRANSMITTER) MISC Use as directed for continuous glucose monitoring. Reuse transmitter for 90 days then discard and replace.   diphenhydrAMINE (BENADRYL) 25 MG tablet Take 1 tablet (25 mg total) by mouth at bedtime as needed.   docusate sodium (COLACE) 100 MG capsule Take 100 mg by mouth 2  (two) times daily.   ENVARSUS XR 4 MG TB24 Take 2 tablets by mouth daily.   famotidine (PEPCID) 10 MG tablet Take 10 mg by mouth 2 (two) times daily.   fluticasone (FLONASE) 50 MCG/ACT nasal spray Place 2 sprays into both nostrils daily.   HUMALOG 100 UNIT/ML injection 100 Units.   hydrOXYzine (ATARAX) 10 MG tablet Take 10 mg by mouth 3 (three) times daily as needed.   insulin glargine (LANTUS) 100 UNIT/ML injection Inject 20 Units into the skin daily. As Needed   K Phos Mono-Sod Phos Di & Mono (K-PHOS-NEUTRAL) [phone] MG TABS Take 1 tablet by mouth daily.   loratadine (CLARITIN) 10 MG tablet Take 10 mg by mouth as directed.   magnesium oxide (MAG-OX) 400 MG tablet Take 2 tablets by mouth 2 (two) times daily.   methocarbamol (ROBAXIN) 500 MG tablet Take 500 mg by mouth every 8 (eight) hours as needed.   methocarbamol (ROBAXIN) 750 MG tablet Take by mouth.   metoprolol succinate (TOPROL-XL) 50 MG 24 hr tablet Take 1 tablet ('50mg'$ ) twice daily.   mycophenolate (MYFORTIC) 180 MG EC tablet Take 180 mg by mouth in the morning, at noon, and at bedtime.   omeprazole (PRILOSEC) 20 MG capsule Take 20 mg by mouth 2 (two) times daily.   omeprazole (PRILOSEC) 40 MG capsule Take 20 mg by mouth daily. Takes 20 MG daily   ondansetron (ZOFRAN) 4 MG tablet Take 1 tablet (4 mg total) by mouth every 8 (eight) hours as needed for nausea or vomiting.   predniSONE (DELTASONE) 5 MG tablet Take 5 mg by mouth daily.   simethicone (MYLICON) 80 MG chewable tablet Chew 80 mg by mouth every 6 (six) hours as needed for flatulence.   sulfamethoxazole-trimethoprim (BACTRIM) 400-80 MG tablet Take 1 tablet by mouth 3 (three) times a week.   tiZANidine (ZANAFLEX) 2 MG tablet Take by mouth.   TYLENOL 500 MG tablet Take 1,000 mg by mouth 3 (three) times daily as needed.     Allergies:   Peanut oil, Shellfish-derived products, Iodine, Peanut-containing drug products, and Shellfish allergy   Social History   Socioeconomic  History   Marital status: Single    Spouse name: Not on file   Number of children: Not on file   Years of education: Not on file   Highest education level: Not on file  Occupational History   Not on file  Tobacco Use   Smoking status: Never   Smokeless tobacco: Never  Substance and Sexual Activity   Alcohol use: No   Drug use: No   Sexual activity: Not Currently    Birth control/protection: Surgical  Other Topics Concern   Not on file  Social History Narrative   Not on file   Social Determinants of Health   Financial Resource Strain: Not on file  Food Insecurity: Not on  file  Transportation Needs: Not on file  Physical Activity: Not on file  Stress: Not on file  Social Connections: Not on file     Family History: The patient's family history includes Breast cancer in her mother; Cancer in her mother; Diabetes in her father; Hypertension in her father and mother.  ROS:   Please see the history of present illness.     All other systems reviewed and are negative.  EKGs/Labs/Other Studies Reviewed:    The following studies were reviewed today:  Echo 03/21/2022 1. Left ventricular ejection fraction, by estimation, is 55 to 60%. The  left ventricle has normal function. The left ventricle has no regional  wall motion abnormalities. There is mild left ventricular hypertrophy.  Left ventricular diastolic parameters  are consistent with Grade I diastolic dysfunction (impaired relaxation).   2. Right ventricular systolic function is normal. The right ventricular  size is normal. There is normal pulmonary artery systolic pressure. The  estimated right ventricular systolic pressure is 35.3 mmHg.   3. The mitral valve is normal in structure. No evidence of mitral valve  regurgitation. No evidence of mitral stenosis.   4. The aortic valve is tricuspid. Aortic valve regurgitation is trivial.  No aortic stenosis is present.   5. The inferior vena cava is normal in size with  greater than 50%  respiratory variability, suggesting right atrial pressure of 3 mmHg.   EKG:  EKG is not ordered today.   Recent Labs: No results found for requested labs within last 365 days.  Recent Lipid Panel    Component Value Date/Time   CHOL 186 11/01/2021 0826   TRIG 84 11/01/2021 0826   HDL 62 11/01/2021 0826   CHOLHDL 3.0 11/01/2021 0826   LDLCALC 109 (H) 11/01/2021 0826     Risk Assessment/Calculations:           Physical Exam:    VS:  BP 126/72   Pulse 85   Ht '5\' 3"'$  (1.6 m)   Wt 182 lb 9.6 oz (82.8 kg)   LMP 06/26/2011   SpO2 98%   BMI 32.35 kg/m        Wt Readings from Last 3 Encounters:  04/17/22 182 lb 9.6 oz (82.8 kg)  03/21/22 177 lb (80.3 kg)  03/06/22 177 lb 12.8 oz (80.6 kg)     GEN:  Well nourished, well developed in no acute distress HEENT: Normal NECK: No JVD; No carotid bruits LYMPHATICS: No lymphadenopathy CARDIAC: RRR, no murmurs, rubs, gallops RESPIRATORY:  Clear to auscultation without rales, wheezing or rhonchi  ABDOMEN: Soft, non-tender, non-distended MUSCULOSKELETAL:  No edema; No deformity  SKIN: Warm and dry NEUROLOGIC:  Alert and oriented x 3 PSYCHIATRIC:  Normal affect   ASSESSMENT:    1. Atypical chest pain   2. Primary hypertension   3. Hyperlipidemia LDL goal <70   4. Controlled type 2 diabetes mellitus without complication, without long-term current use of insulin ([place])   5. Renal transplant recipient    PLAN:    In order of problems listed above:  Atypical chest pain: Recent echocardiogram and Myoview were reassuring.  No additional work-up is needed at this time  Hypertension: Blood pressure was low during the last office visit, I reduced her amlodipine to 2.5 mg daily, blood pressure has normalized.  Hyperlipidemia: On Lipitor  DM2: Managed by primary care provider  History of renal transplant: Followed by nephrology service.           Medication Adjustments/Labs and Tests  Ordered: Current  medicines are reviewed at length with the patient today.  Concerns regarding medicines are outlined above.  No orders of the defined types were placed in this encounter.  No orders of the defined types were placed in this encounter.   Patient Instructions  Medication Instructions:  Your physician recommends that you continue on your current medications as directed. Please refer to the Current Medication list given to you today.  *If you need a refill on your cardiac medications before your next appointment, please call your pharmacy*   Lab Work: NONE  If you have labs (blood work) drawn today and your tests are completely normal, you will receive your results only by: [place] (if you have MyChart) OR A paper copy in the mail If you have any lab test that is abnormal or we need to change your treatment, we will call you to review the results.   Testing/Procedures: NONE   Follow-Up: At [facility], you and your health needs are our priority.  As part of our continuing mission to provide you with exceptional heart care, we have created designated Provider Care Teams.  These Care Teams include your primary Cardiologist (physician) and Advanced Practice Providers (APPs -  Physician Assistants and Nurse Practitioners) who all work together to provide you with the care you need, when you need it.  We recommend signing up for the patient portal called "MyChart".  Sign up information is provided on this After Visit Summary.  MyChart is used to connect with patients for Virtual Visits (Telemedicine).  Patients are able to view lab/test results, encounter notes, upcoming appointments, etc.  Non-urgent messages can be sent to your provider as well.   To learn more about what you can do with MyChart, go to [email or web address].    Other Instructions Please keep upcoming appointment with Dr.Hilty   Important Information About Sugar         Hilbert Corrigan, [place]   04/19/2022 11:19 PM    [place]

## 2022-04-19 ENCOUNTER — Ambulatory Visit: Payer: Medicaid Other | Admitting: Physical Therapy

## 2022-04-19 ENCOUNTER — Encounter: Payer: Self-pay | Admitting: Physical Therapy

## 2022-04-19 ENCOUNTER — Encounter: Payer: Self-pay | Admitting: Physician Assistant

## 2022-04-19 DIAGNOSIS — M6281 Muscle weakness (generalized): Secondary | ICD-10-CM

## 2022-04-19 DIAGNOSIS — M25611 Stiffness of right shoulder, not elsewhere classified: Secondary | ICD-10-CM

## 2022-04-19 DIAGNOSIS — M25511 Pain in right shoulder: Secondary | ICD-10-CM

## 2022-04-19 DIAGNOSIS — R293 Abnormal posture: Secondary | ICD-10-CM

## 2022-04-19 NOTE — Addendum Note (Signed)
Addended byEulas Post, Leela Vanbrocklin on: 04/19/2022 11:20 PM   Modules accepted: Level of Service

## 2022-04-19 NOTE — Therapy (Signed)
OUTPATIENT PHYSICAL THERAPY SHOULDER TREATMENT   Patient Name: Maureen Barnes MRN: [id number] DOB:11-Jul-1961, 61 y.o., female Today's Date: 04/19/2022   PT End of Session - 04/19/22 1344     Visit Number 10    Number of Visits 14    Date for PT Re-Evaluation 05/24/22    Authorization Type CCME    Authorization Time Period reauth for 4 more visits submitted 9/28, 9/28 to 35/0 for MD cert    PT Start Time 1318    PT Stop Time 1356    PT Time Calculation (min) 38 min    Activity Tolerance Patient tolerated treatment well    Behavior During Therapy [facility] for tasks assessed/performed                 Past Medical History:  Diagnosis Date   Anemia    Cervical disc disease    Chronic kidney disease (CKD), stage V ([place])    Diabetes mellitus    Hep C w/o coma, chronic ([place])    Hypertension    Lumbar disc disease    Peritoneal dialysis status ([place])    Renal disorder    Past Surgical History:  Procedure Laterality Date   TUBAL LIGATION     Patient Active Problem List   Diagnosis Date Noted   Anxiety 07/17/2018   ESRD on peritoneal dialysis ([place]) 07/17/2018   H/O cesarean section 07/17/2018   Evaluation by medical service required 04/02/2018   Pre-transplant evaluation for end stage renal disease 11/18/2017   Unilateral inguinal hernia 11/18/2017   Hyperlipidemia 01/22/2017   Palpitations 01/22/2017   Dyspnea on exertion 01/22/2017   CKD (chronic kidney disease) stage 3, GFR 30-59 ml/min (HCC) 10/18/2016   Non-compliance 10/18/2016   Chronic ethmoidal sinusitis 10/18/2016   Hepatitis C, chronic ([place]) 05/15/2012   Diabetes mellitus type 2, insulin dependent ([place]) 05/15/2012   Hypertension 05/15/2012   Anemia 05/15/2012    PCP: Eldridge Abrahams  REFERRING PROVIDER: Earlie Server  REFERRING DIAG: s/p shoulder scope  THERAPY DIAG:  Stiffness of right shoulder, not elsewhere classified - Plan: PT plan of care cert/re-cert  Acute pain of right shoulder - Plan: PT  plan of care cert/re-cert  Muscle weakness (generalized) - Plan: PT plan of care cert/re-cert  Abnormal posture - Plan: PT plan of care cert/re-cert  Rationale for Evaluation and Treatment Rehabilitation  ONSET DATE: 10/18/21  SUBJECTIVE:                                                                                                                                                                                      SUBJECTIVE STATEMENT:  Shoulder is back and forth between  ache and stiffness here and there, elbow is the worst. Having trouble with opening/lifting stuff. Ache here, stiff there, everything is ongoing.   PERTINENT HISTORY: R shoulder scope, DM, HTN, CKD  PAIN:  Are you having pain? No   PRECAUTIONS: None  WEIGHT BEARING RESTRICTIONS No  FALLS:  Has patient fallen in last 6 months? No  LIVING ENVIRONMENT: Lives with: lives alone Lives in: House/apartment Stairs: Yes: Internal: 14 steps; can reach both Has following equipment at home: Single point cane, Walker - 2 wheeled, and bed side commode  OCCUPATION: On disability   PLOF: Independent  PATIENT GOALS to be able to reach overhead, have no more pain   OBJECTIVE:   DIAGNOSTIC FINDINGS:  IMPRESSION: Calcific tendinosis of the distal supraspinatus and infraspinatus tendons with compact 2.2 cm calcific deposit along the bursal surface at the footprint and associated subacromial-subdeltoid bursitis. There is bursal sided fraying and probable low-grade tearing of the distal supraspinatus infraspinatus tendons at the footprint.   Mild AC joint arthropathy.  PATIENT SURVEYS:  FOTO 48/100  COGNITION:  Overall cognitive status: Within functional limits for tasks assessed     SENSATION: WFL  POSTURE: Rounded head, forward head   UPPER EXTREMITY ROM:   Active ROM Right eval Right UE 04/02/22 R UE 04/19/22  Shoulder flexion 120 138 128  Shoulder extension     Shoulder abduction 90 w/pain 105 114   Shoulder adduction     Shoulder internal rotation WFL pain at end range  FIR L4   Shoulder external rotation 25 60 FER top of shoulder, PT able to move arm into more motion easily   Elbow flexion WNL    Elbow extension WNL    Wrist flexion     Wrist extension     Wrist ulnar deviation     Wrist radial deviation     Wrist pronation     Wrist supination     (Blank rows = not tested)  UPPER EXTREMITY MMT: pain with all on testing R side   MMT Right eval Left eval R 9/28 L 9/28  Shoulder flexion 2- '3 3 3  ' Shoulder extension   2 2  Shoulder abduction 2- 3 3+ 3+  Shoulder adduction      Shoulder internal rotation 2  4+ 4+  Shoulder external rotation '2   3 3  ' Middle trapezius      Lower trapezius      Elbow flexion 4  4+ 4+  Elbow extension '4   3 3  ' Wrist flexion      Wrist extension      Wrist ulnar deviation      Wrist radial deviation      Wrist pronation      Wrist supination      Grip strength (lbs)      (Blank rows = not tested)  SHOULDER SPECIAL TESTS:  Impingement tests: Neer impingement test: positive , Hawkins/Kennedy impingement test: positive , and Painful arc test: positive   Rotator cuff assessment: Empty can test: positive   Biceps assessment: Speed's test: positive   JOINT MOBILITY TESTING:  Sharp pain w/abd at 90d, increased muscle guarding with all PROM   PALPATION:  TTP R biceps, tight upper trap   TODAY'S TREATMENT:   04/19/22  Objective measures/education as appropriate and goal review/POC   TherEx  UBE L2 x3 minutes forward/3 minutes backward  R Shoulder flexion 3# 2x10  R Shoulder ABD 3# 2x10  Shoulder flexion stretch on door 10x5  seconds with pillowcase    04/02/22 UBE L2.9 x3 min each  Rows 15lb 2x10  Lats 15lb 2x10 Shoulder Ext 5lb 2x10 ER red 2x10 Shoulder Flex 2lb 2x10 Shoulder Abd 1lb 2x10 OHP 1lb WaTE 2x10   03/22/22 UBE 1.5 x 3 min each  Seated ER red 2x10 Horiz Abd yellow 2x10 Flexion 2lb 2x10 Shoulder abd 1lb 2x10   Rows 10lb 2x10 Lats 15lb 2x10 Shoulder Ext 5lb 2x10  03/20/22 UBE L1.6 x 3 minutes each way. Supine/hooklying bridge with pelvic tilt, 10 reps Supine pelvic tilt with march 5 each leg. Tight ITB-ITB stretch B HS stretch, figure 4 stretch B side step against Red Tband at the knees, 2 x 10 each direction Shoulder ext, rows, ER with Red Tband resistance 2 x 10 reps each   03/15/22 UBE L1.6 x 3 min each Seated rows 10# 2x10 Lat pull downs 15# 2x10 Shrugs 2lb 2x10  ER red 2x10  Shoulder flex 2lb 2x10  Scaption 2lb 2x10 Triceps Ext 20lb 2x10 RUE IR red 2x10   PATIENT EDUCATION: Education details: POC Person educated: Patient Education method: Explanation Education comprehension: verbalized understanding   HOME EXERCISE PROGRAM: [id number]  ASSESSMENT:  CLINICAL IMPRESSION:  Noella arrives today doing OK, we got objective measures which showed mixed levels of progress but she does seem to be getting stronger. There seem to be multiple sources of pain and it was unclear as to what's causing her pain- surgical pain vs pain of other origin. She's requesting to just finish a few more sessions before DC, will resubmit to [facility] today.    OBJECTIVE IMPAIRMENTS decreased ROM, decreased strength, impaired UE functional use, postural dysfunction, and pain.   ACTIVITY LIMITATIONS carrying, lifting, dressing, reach over head, and hygiene/grooming  PARTICIPATION LIMITATIONS: cleaning, laundry, shopping, and yard work  [employer] POTENTIAL: Good  CLINICAL DECISION MAKING: Stable/uncomplicated  EVALUATION COMPLEXITY: Low   GOALS: Goals reviewed with patient? Yes  SHORT TERM GOALS: Target date: 03/29/22  Patient will be independent with initial HEP.  Goal status: 9/28- MET 1x/day    LONG TERM GOALS: Target date: 04/26/22  Patient will be independent with advanced/ongoing HEP to improve outcomes and carryover.  Goal status: Met  2.  Patient will report <2/10 at worst in R shoulder  pain to improve QOL.  Goal status: 9/28- ONGOING no more than 3-4/10  3.  Patient to demonstrate improved upright posture with posterior shoulder girdle engaged to promote improved glenohumeral joint mobility. Goal status: 9/28- ONGOING poor posture   4.  Patient to improve R shoulder AROM to [facility] without pain provocation to allow for increased ease with ADLs.  Goal status: IN PROGRESS 9/28 ROM still limited   5.  Patient will demonstrate improved functional UE strength by 1 grade in all weak muscle groups.  Goal status: 9/28- ONGOING   6  Patient will report 17 on FOTO to demonstrate improved functional ability.  Baseline: 48 Goal status: Met  PLAN: PT FREQUENCY: 1x/week  PT DURATION: 4 weeks  PLANNED INTERVENTIONS: Therapeutic exercises, Therapeutic activity, Neuromuscular re-education, Balance training, Gait training, Patient/Family education, Self Care, Joint mobilization, Dry Needling, Electrical stimulation, Cryotherapy, Moist heat, Taping, Vasopneumatic device, Ultrasound, Ionotophoresis 67m/ml Dexamethasone, and Manual therapy  PLAN FOR NEXT SESSION: Assess HEP tolerance, add shoulder strengthening. 4 more visits then DC per her request    KAnn LionsPT DPT PN2  04/19/2022, 1:59 PM

## 2022-05-02 ENCOUNTER — Encounter: Payer: Self-pay | Admitting: Physical Therapy

## 2022-05-02 ENCOUNTER — Ambulatory Visit: Payer: Medicaid Other | Attending: Orthopedic Surgery | Admitting: Physical Therapy

## 2022-05-02 DIAGNOSIS — M6281 Muscle weakness (generalized): Secondary | ICD-10-CM

## 2022-05-02 DIAGNOSIS — M25611 Stiffness of right shoulder, not elsewhere classified: Secondary | ICD-10-CM

## 2022-05-02 DIAGNOSIS — R293 Abnormal posture: Secondary | ICD-10-CM

## 2022-05-02 DIAGNOSIS — M25511 Pain in right shoulder: Secondary | ICD-10-CM | POA: Diagnosis present

## 2022-05-02 NOTE — Therapy (Signed)
OUTPATIENT PHYSICAL THERAPY SHOULDER TREATMENT   Patient Name: Maureen Barnes MRN: [id number] DOB:09-08-1960, 61 y.o., female Today's Date: 05/02/2022   PT End of Session - 05/02/22 1343     Visit Number 11    Date for PT Re-Evaluation 05/24/22    PT Start Time 1345    PT Stop Time 1430    PT Time Calculation (min) 45 min    Activity Tolerance Patient tolerated treatment well    Behavior During Therapy [facility] for tasks assessed/performed                 Past Medical History:  Diagnosis Date   Anemia    Cervical disc disease    Chronic kidney disease (CKD), stage V ([place])    Diabetes mellitus    Hep C w/o coma, chronic ([place])    Hypertension    Lumbar disc disease    Peritoneal dialysis status ([place])    Renal disorder    Past Surgical History:  Procedure Laterality Date   TUBAL LIGATION     Patient Active Problem List   Diagnosis Date Noted   Anxiety 07/17/2018   ESRD on peritoneal dialysis ([place]) 07/17/2018   H/O cesarean section 07/17/2018   Evaluation by medical service required 04/02/2018   Pre-transplant evaluation for end stage renal disease 11/18/2017   Unilateral inguinal hernia 11/18/2017   Hyperlipidemia 01/22/2017   Palpitations 01/22/2017   Dyspnea on exertion 01/22/2017   CKD (chronic kidney disease) stage 3, GFR 30-59 ml/min (HCC) 10/18/2016   Non-compliance 10/18/2016   Chronic ethmoidal sinusitis 10/18/2016   Hepatitis C, chronic ([place]) 05/15/2012   Diabetes mellitus type 2, insulin dependent ([place]) 05/15/2012   Hypertension 05/15/2012   Anemia 05/15/2012    PCP: Eldridge Abrahams  REFERRING PROVIDER: Earlie Server  REFERRING DIAG: s/p shoulder scope  THERAPY DIAG:  Stiffness of right shoulder, not elsewhere classified  Acute pain of right shoulder  Muscle weakness (generalized)  Abnormal posture  Rationale for Evaluation and Treatment Rehabilitation  ONSET DATE: 10/18/21  SUBJECTIVE:                                                                                                                                                                                       SUBJECTIVE STATEMENT:  "Everything has been all right, but I think I messed up this collar bone over reaching" She she reaches her L arm up and back down is having pain. R elbows is still hurting trouble lifting or opening things.  PERTINENT HISTORY: R shoulder scope, DM, HTN, CKD  PAIN:  Are you having pain? 3/10 L collar bone, 4/10 R elbow   PRECAUTIONS: None  WEIGHT BEARING RESTRICTIONS No  FALLS:  Has patient fallen in last 6 months? No  LIVING ENVIRONMENT: Lives with: lives alone Lives in: House/apartment Stairs: Yes: Internal: 14 steps; can reach both Has following equipment at home: Single point cane, Walker - 2 wheeled, and bed side commode  OCCUPATION: On disability   PLOF: Independent  PATIENT GOALS to be able to reach overhead, have no more pain   OBJECTIVE:   DIAGNOSTIC FINDINGS:  IMPRESSION: Calcific tendinosis of the distal supraspinatus and infraspinatus tendons with compact 2.2 cm calcific deposit along the bursal surface at the footprint and associated subacromial-subdeltoid bursitis. There is bursal sided fraying and probable low-grade tearing of the distal supraspinatus infraspinatus tendons at the footprint.   Mild AC joint arthropathy.  PATIENT SURVEYS:  FOTO 48/100  COGNITION:  Overall cognitive status: Within functional limits for tasks assessed     SENSATION: WFL  POSTURE: Rounded head, forward head   UPPER EXTREMITY ROM:   Active ROM Right eval Right UE 04/02/22 R UE 04/19/22  Shoulder flexion 120 138 128  Shoulder extension     Shoulder abduction 90 w/pain 105 114  Shoulder adduction     Shoulder internal rotation WFL pain at end range  FIR L4   Shoulder external rotation 25 60 FER top of shoulder, PT able to move arm into more motion easily   Elbow flexion WNL    Elbow extension WNL    Wrist  flexion     Wrist extension     Wrist ulnar deviation     Wrist radial deviation     Wrist pronation     Wrist supination     (Blank rows = not tested)  UPPER EXTREMITY MMT: pain with all on testing R side   MMT Right eval Left eval R 9/28 L 9/28  Shoulder flexion 2- _0 Shoulder extension   2 2  Shoulder abduction 2- 3 3+ 3+  Shoulder adduction      Shoulder internal rotation 2  4+ 4+  Shoulder external rotation _1 Middle trapezius      Lower trapezius      Elbow flexion 4  4+ 4+  Elbow extension _2 Wrist flexion      Wrist extension      Wrist ulnar deviation      Wrist radial deviation      Wrist pronation      Wrist supination      Grip strength (lbs)      (Blank rows = not tested)  SHOULDER SPECIAL TESTS:  Impingement tests: Neer impingement test: positive , Hawkins/Kennedy impingement test: positive , and Painful arc test: positive   Rotator cuff assessment: Empty can test: positive   Biceps assessment: Speed's test: positive   JOINT MOBILITY TESTING:  Sharp pain w/abd at 90d, increased muscle guarding with all PROM   PALPATION:  TTP R biceps, tight upper trap   TODAY'S TREATMENT:  05/02/22 UBE L2 x3 minutes forward/3 minutes backward  Seated Rows 20lb 2x10 Seated lats 20lb 2x10 Chest press 10lb x5, 5lb x10 Shoulder Ext 5lb 2x10  Shoulder ER red 2x10 Shoulder Flex 2lb 2x10  04/19/22  Objective measures/education as appropriate and goal review/POC   TherEx  UBE L2 x3 minutes forward/3 minutes backward  R Shoulder flexion 3# 2x10  R Shoulder ABD 3# 2x10  Shoulder flexion stretch on door 10x5 seconds with pillowcase    04/02/22 UBE L2.9  x3 min each  Rows 15lb 2x10  Lats 15lb 2x10 Shoulder Ext 5lb 2x10 ER red 2x10 Shoulder Flex 2lb 2x10 Shoulder Abd 1lb 2x10 OHP 1lb WaTE 2x10   03/22/22 UBE 1.5 x 3 min each  Seated ER red 2x10 Horiz Abd yellow 2x10 Flexion 2lb 2x10 Shoulder abd 1lb 2x10  Rows 10lb 2x10 Lats 15lb  2x10 Shoulder Ext 5lb 2x10  03/20/22 UBE L1.6 x 3 minutes each way. Supine/hooklying bridge with pelvic tilt, 10 reps Supine pelvic tilt with march 5 each leg. Tight ITB-ITB stretch B HS stretch, figure 4 stretch B side step against Red Tband at the knees, 2 x 10 each direction Shoulder ext, rows, ER with Red Tband resistance 2 x 10 reps each   03/15/22 UBE L1.6 x 3 min each Seated rows 10# 2x10 Lat pull downs 15# 2x10 Shrugs 2lb 2x10  ER red 2x10  Shoulder flex 2lb 2x10  Scaption 2lb 2x10 Triceps Ext 20lb 2x10 RUE IR red 2x10   PATIENT EDUCATION: Education details: POC Person educated: Patient Education method: Explanation Education comprehension: verbalized understanding   HOME EXERCISE PROGRAM: [id number]  ASSESSMENT:  CLINICAL IMPRESSION:  Noretta arrives today doing OK, She continues to report multiple sources of pain and it was unclear as to what's causing her pain- surgical pain vs pain of other origin. Interventions focused on postural and shoulder strength. Some discomfort reported with external rotation and chest press.    OBJECTIVE IMPAIRMENTS decreased ROM, decreased strength, impaired UE functional use, postural dysfunction, and pain.   ACTIVITY LIMITATIONS carrying, lifting, dressing, reach over head, and hygiene/grooming  PARTICIPATION LIMITATIONS: cleaning, laundry, shopping, and yard work  [employer] POTENTIAL: Good  CLINICAL DECISION MAKING: Stable/uncomplicated  EVALUATION COMPLEXITY: Low   GOALS: Goals reviewed with patient? Yes  SHORT TERM GOALS: Target date: 03/29/22  Patient will be independent with initial HEP.  Goal status: 9/28- MET 1x/day    LONG TERM GOALS: Target date: 04/26/22  Patient will be independent with advanced/ongoing HEP to improve outcomes and carryover.  Goal status: Met  2.  Patient will report <2/10 at worst in R shoulder pain to improve QOL.  Goal status: 9/28- ONGOING no more than 3-4/10  3.  Patient to  demonstrate improved upright posture with posterior shoulder girdle engaged to promote improved glenohumeral joint mobility. Goal status: 9/28- ONGOING poor posture   4.  Patient to improve R shoulder AROM to [facility] without pain provocation to allow for increased ease with ADLs.  Goal status: IN PROGRESS 9/28 ROM still limited   5.  Patient will demonstrate improved functional UE strength by 1 grade in all weak muscle groups.  Goal status: 9/28- ONGOING   6  Patient will report 29 on FOTO to demonstrate improved functional ability.  Baseline: 48 Goal status: Met  PLAN: PT FREQUENCY: 1x/week  PT DURATION: 4 weeks  PLANNED INTERVENTIONS: Therapeutic exercises, Therapeutic activity, Neuromuscular re-education, Balance training, Gait training, Patient/Family education, Self Care, Joint mobilization, Dry Needling, Electrical stimulation, Cryotherapy, Moist heat, Taping, Vasopneumatic device, Ultrasound, Ionotophoresis 27m/ml Dexamethasone, and Manual therapy  PLAN FOR NEXT SESSION: Assess HEP tolerance, add shoulder strengthening. 4 more visits then DC per her request    RCheri Fowler PTA 05/02/2022, 1:45 PM

## 2022-05-09 ENCOUNTER — Encounter: Payer: Self-pay | Admitting: Physical Therapy

## 2022-05-09 ENCOUNTER — Ambulatory Visit: Payer: Medicaid Other | Admitting: Physical Therapy

## 2022-05-09 DIAGNOSIS — M25511 Pain in right shoulder: Secondary | ICD-10-CM

## 2022-05-09 DIAGNOSIS — M25611 Stiffness of right shoulder, not elsewhere classified: Secondary | ICD-10-CM

## 2022-05-09 DIAGNOSIS — R293 Abnormal posture: Secondary | ICD-10-CM

## 2022-05-09 DIAGNOSIS — M6281 Muscle weakness (generalized): Secondary | ICD-10-CM

## 2022-05-09 NOTE — Therapy (Signed)
OUTPATIENT PHYSICAL THERAPY SHOULDER TREATMENT   Patient Name: Maureen Barnes MRN: [id number] DOB:[birth year], 61 y.o., female Today's Date: 05/09/2022   PT End of Session - 05/09/22 1259     Visit Number 12    Date for PT Re-Evaluation 05/24/22    PT Start Time 1300    PT Stop Time 1345    PT Time Calculation (min) 45 min    Activity Tolerance Patient tolerated treatment well    Behavior During Therapy [facility] for tasks assessed/performed                 Past Medical History:  Diagnosis Date   Anemia    Cervical disc disease    Chronic kidney disease (CKD), stage V ([place])    Diabetes mellitus    Hep C w/o coma, chronic ([place])    Hypertension    Lumbar disc disease    Peritoneal dialysis status ([place])    Renal disorder    Past Surgical History:  Procedure Laterality Date   TUBAL LIGATION     Patient Active Problem List   Diagnosis Date Noted   Anxiety 07/17/2018   ESRD on peritoneal dialysis ([place]) 07/17/2018   H/O cesarean section 07/17/2018   Evaluation by medical service required 04/02/2018   Pre-transplant evaluation for end stage renal disease 11/18/2017   Unilateral inguinal hernia 11/18/2017   Hyperlipidemia 01/22/2017   Palpitations 01/22/2017   Dyspnea on exertion 01/22/2017   CKD (chronic kidney disease) stage 3, GFR 30-59 ml/min (HCC) 10/18/2016   Non-compliance 10/18/2016   Chronic ethmoidal sinusitis 10/18/2016   Hepatitis C, chronic ([place]) 05/15/2012   Diabetes mellitus type 2, insulin dependent ([place]) 05/15/2012   Hypertension 05/15/2012   Anemia 05/15/2012    PCP: Eldridge Abrahams  REFERRING PROVIDER: Earlie Server  REFERRING DIAG: s/p shoulder scope  THERAPY DIAG:  Stiffness of right shoulder, not elsewhere classified  Acute pain of right shoulder  Muscle weakness (generalized)  Abnormal posture  Rationale for Evaluation and Treatment Rehabilitation  ONSET DATE: 10/18/21  SUBJECTIVE:                                                                                                                                                                                       SUBJECTIVE STATEMENT:  "Not too bad, not the other side that hurts" Pt reports carrying something over her shoulder Saturday night  PERTINENT HISTORY: R shoulder scope, DM, HTN, CKD  PAIN:  Are you having pain? 5/10 L collar bone, 4/10 R elbow   PRECAUTIONS: None  WEIGHT BEARING RESTRICTIONS No  FALLS:  Has patient fallen in last 6 months? No  LIVING ENVIRONMENT: Lives with:  lives alone Lives in: House/apartment Stairs: Yes: Internal: 14 steps; can reach both Has following equipment at home: Single point cane, Walker - 2 wheeled, and bed side commode  OCCUPATION: On disability   PLOF: Independent  PATIENT GOALS to be able to reach overhead, have no more pain   OBJECTIVE:   DIAGNOSTIC FINDINGS:  IMPRESSION: Calcific tendinosis of the distal supraspinatus and infraspinatus tendons with compact 2.2 cm calcific deposit along the bursal surface at the footprint and associated subacromial-subdeltoid bursitis. There is bursal sided fraying and probable low-grade tearing of the distal supraspinatus infraspinatus tendons at the footprint.   Mild AC joint arthropathy.  PATIENT SURVEYS:  FOTO 48/100   POSTURE: Rounded head, forward head   UPPER EXTREMITY ROM:   Active ROM Right eval Right UE 04/02/22 R UE 04/19/22 RUE 05/09/22 LUE  05/09/22  Shoulder flexion 120 138 128 140 154  Shoulder extension       Shoulder abduction 90 w/pain 105 114 131 144  Shoulder adduction       Shoulder internal rotation WFL pain at end range  FIR L4     Shoulder external rotation 25 60 FER top of shoulder, PT able to move arm into more motion easily     Elbow flexion WNL      Elbow extension WNL      Wrist flexion       Wrist extension       Wrist ulnar deviation       Wrist radial deviation       Wrist pronation       Wrist supination       (Blank  rows = not tested)  UPPER EXTREMITY MMT: pain with all on testing R side   MMT Right eval Left eval R 9/28 L 9/28  Shoulder flexion 2- _0 Shoulder extension   2 2  Shoulder abduction 2- 3 3+ 3+  Shoulder adduction      Shoulder internal rotation 2  4+ 4+  Shoulder external rotation _1 Middle trapezius      Lower trapezius      Elbow flexion 4  4+ 4+  Elbow extension _2 Wrist flexion      Wrist extension      Wrist ulnar deviation      Wrist radial deviation      Wrist pronation      Wrist supination      Grip strength (lbs)      (Blank rows = not tested)  SHOULDER SPECIAL TESTS:  Impingement tests: Neer impingement test: positive , Hawkins/Kennedy impingement test: positive , and Painful arc test: positive   Rotator cuff assessment: Empty can test: positive   Biceps assessment: Speed's test: positive   JOINT MOBILITY TESTING:  Sharp pain w/abd at 90d, increased muscle guarding with all PROM   PALPATION:  TTP R biceps, tight upper trap   TODAY'S TREATMENT:  05/09/22 UBE L2 x3 minutes forward/3 minutes backward  IR towel stretch RUE 4x10'' Triceps Ext 20lb 2x10 Seated rows & lats 20lb 2x10 Chest press 10lb 2x10 Shoulder Er red 2x10   05/02/22 UBE L2 x3 minutes forward/3 minutes backward  Seated Rows 20lb 2x10 Seated lats 20lb 2x10 Chest press 10lb x5, 5lb x10 Shoulder Ext 5lb 2x10  Shoulder ER red 2x10 Shoulder Flex 2lb 2x10  04/19/22  Objective measures/education as appropriate and goal review/POC   TherEx  UBE L2 x3 minutes forward/3  minutes backward  R Shoulder flexion 3# 2x10  R Shoulder ABD 3# 2x10  Shoulder flexion stretch on door 10x5 seconds with pillowcase    04/02/22 UBE L2.9 x3 min each  Rows 15lb 2x10  Lats 15lb 2x10 Shoulder Ext 5lb 2x10 ER red 2x10 Shoulder Flex 2lb 2x10 Shoulder Abd 1lb 2x10 OHP 1lb WaTE 2x10   03/22/22 UBE 1.5 x 3 min each  Seated ER red 2x10 Horiz Abd yellow 2x10 Flexion 2lb 2x10 Shoulder  abd 1lb 2x10  Rows 10lb 2x10 Lats 15lb 2x10 Shoulder Ext 5lb 2x10    PATIENT EDUCATION: Education details: POC Person educated: Patient Education method: Explanation Education comprehension: verbalized understanding   HOME EXERCISE PROGRAM: [id number]  ASSESSMENT:  CLINICAL IMPRESSION:  Abraham arrives today doing OK, She continues to report multiple sources of pain and it was unclear as to what's causing her pain- surgical pain vs pain of other origin. Interventions focused on postural and shoulder strength. Tactile cues required to keep UE by her side with ER.    OBJECTIVE IMPAIRMENTS decreased ROM, decreased strength, impaired UE functional use, postural dysfunction, and pain.   ACTIVITY LIMITATIONS carrying, lifting, dressing, reach over head, and hygiene/grooming  PARTICIPATION LIMITATIONS: cleaning, laundry, shopping, and yard work  [employer] POTENTIAL: Good  CLINICAL DECISION MAKING: Stable/uncomplicated  EVALUATION COMPLEXITY: Low   GOALS: Goals reviewed with patient? Yes  SHORT TERM GOALS: Target date: 03/29/22  Patient will be independent with initial HEP.  Goal status: 9/28- MET 1x/day    LONG TERM GOALS: Target date: 04/26/22  Patient will be independent with advanced/ongoing HEP to improve outcomes and carryover.  Goal status: Met  2.  Patient will report <2/10 at worst in R shoulder pain to improve QOL.  Goal status: 9/28- ONGOING no more than 3-4/10  3.  Patient to demonstrate improved upright posture with posterior shoulder girdle engaged to promote improved glenohumeral joint mobility. Goal status: 9/28- ONGOING poor posture   4.  Patient to improve R shoulder AROM to [facility] without pain provocation to allow for increased ease with ADLs.  Goal status: IN PROGRESS 9/28 ROM still limited   5.  Patient will demonstrate improved functional UE strength by 1 grade in all weak muscle groups.  Goal status: 9/28- ONGOING   6  Patient will report 28 on FOTO  to demonstrate improved functional ability.  Baseline: 48 Goal status: Met  PLAN: PT FREQUENCY: 1x/week  PT DURATION: 4 weeks  PLANNED INTERVENTIONS: Therapeutic exercises, Therapeutic activity, Neuromuscular re-education, Balance training, Gait training, Patient/Family education, Self Care, Joint mobilization, Dry Needling, Electrical stimulation, Cryotherapy, Moist heat, Taping, Vasopneumatic device, Ultrasound, Ionotophoresis 44m/ml Dexamethasone, and Manual therapy  PLAN FOR NEXT SESSION: Assess HEP tolerance, add shoulder strengthening. 4 more visits then DC per her request    RCheri Fowler PTA 05/09/2022, 1:00 PM

## 2022-05-16 ENCOUNTER — Encounter: Payer: Self-pay | Admitting: Physical Therapy

## 2022-05-16 ENCOUNTER — Ambulatory Visit: Payer: Medicaid Other | Admitting: Physical Therapy

## 2022-05-16 DIAGNOSIS — M6281 Muscle weakness (generalized): Secondary | ICD-10-CM

## 2022-05-16 DIAGNOSIS — M25611 Stiffness of right shoulder, not elsewhere classified: Secondary | ICD-10-CM | POA: Diagnosis not present

## 2022-05-16 DIAGNOSIS — M25511 Pain in right shoulder: Secondary | ICD-10-CM

## 2022-05-16 DIAGNOSIS — R293 Abnormal posture: Secondary | ICD-10-CM

## 2022-05-16 NOTE — Therapy (Signed)
OUTPATIENT PHYSICAL THERAPY SHOULDER TREATMENT   Patient Name: Maureen Barnes MRN: [id number] DOB:[birth year], 61 y.o., female Today's Date: 05/16/2022   PT End of Session - 05/16/22 1258     Visit Number 13    Date for PT Re-Evaluation 05/24/22    PT Start Time 1300    PT Stop Time 1345    PT Time Calculation (min) 45 min    Activity Tolerance Patient tolerated treatment well    Behavior During Therapy [facility] for tasks assessed/performed                 Past Medical History:  Diagnosis Date   Anemia    Cervical disc disease    Chronic kidney disease (CKD), stage V ([place])    Diabetes mellitus    Hep C w/o coma, chronic ([place])    Hypertension    Lumbar disc disease    Peritoneal dialysis status ([place])    Renal disorder    Past Surgical History:  Procedure Laterality Date   TUBAL LIGATION     Patient Active Problem List   Diagnosis Date Noted   Anxiety 07/17/2018   ESRD on peritoneal dialysis ([place]) 07/17/2018   H/O cesarean section 07/17/2018   Evaluation by medical service required 04/02/2018   Pre-transplant evaluation for end stage renal disease 11/18/2017   Unilateral inguinal hernia 11/18/2017   Hyperlipidemia 01/22/2017   Palpitations 01/22/2017   Dyspnea on exertion 01/22/2017   CKD (chronic kidney disease) stage 3, GFR 30-59 ml/min (HCC) 10/18/2016   Non-compliance 10/18/2016   Chronic ethmoidal sinusitis 10/18/2016   Hepatitis C, chronic ([place]) 05/15/2012   Diabetes mellitus type 2, insulin dependent ([place]) 05/15/2012   Hypertension 05/15/2012   Anemia 05/15/2012    PCP: Eldridge Abrahams  REFERRING PROVIDER: Earlie Server  REFERRING DIAG: s/p shoulder scope  THERAPY DIAG:  Stiffness of right shoulder, not elsewhere classified  Muscle weakness (generalized)  Acute pain of right shoulder  Abnormal posture  Rationale for Evaluation and Treatment Rehabilitation  ONSET DATE: 10/18/21  SUBJECTIVE:                                                                                                                                                                                       SUBJECTIVE STATEMENT: "OK" Still having pain in the R elbow from picking up stuff with one arm at home. She reports overreaching with her L arm and also carrying a bag over her L shoulder causing more pain, making it difficulty to use.  PERTINENT HISTORY: R shoulder scope, DM, HTN, CKD  PAIN:  Are you having pain? 2/10 L collar bone, 2/10 R elbow   PRECAUTIONS:  None  WEIGHT BEARING RESTRICTIONS No  FALLS:  Has patient fallen in last 6 months? No  LIVING ENVIRONMENT: Lives with: lives alone Lives in: House/apartment Stairs: Yes: Internal: 14 steps; can reach both Has following equipment at home: Single point cane, Walker - 2 wheeled, and bed side commode  OCCUPATION: On disability   PLOF: Independent  PATIENT GOALS to be able to reach overhead, have no more pain   OBJECTIVE:   DIAGNOSTIC FINDINGS:  IMPRESSION: Calcific tendinosis of the distal supraspinatus and infraspinatus tendons with compact 2.2 cm calcific deposit along the bursal surface at the footprint and associated subacromial-subdeltoid bursitis. There is bursal sided fraying and probable low-grade tearing of the distal supraspinatus infraspinatus tendons at the footprint.   Mild AC joint arthropathy.  PATIENT SURVEYS:  FOTO 48/100   POSTURE: Rounded head, forward head   UPPER EXTREMITY ROM:   Active ROM Right eval Right UE 04/02/22 R UE 04/19/22 RUE 05/09/22 LUE  05/09/22 RUE 05/16/22 LUE  05/16/22  Shoulder flexion 120 138 128 140 154 155 165  Shoulder extension         Shoulder abduction 90 w/pain 105 114 131 144 145 148  Shoulder adduction         Shoulder internal rotation WFL pain at end range  FIR L4       Shoulder external rotation 25 60 FER top of shoulder, PT able to move arm into more motion easily       Elbow flexion WNL        Elbow extension WNL         Wrist flexion         Wrist extension         Wrist ulnar deviation         Wrist radial deviation         Wrist pronation         Wrist supination         (Blank rows = not tested)  UPPER EXTREMITY MMT: pain with all on testing R side   MMT Right eval Left eval R 9/28 L 9/28  Shoulder flexion 2- _0 Shoulder extension   2 2  Shoulder abduction 2- 3 3+ 3+  Shoulder adduction      Shoulder internal rotation 2  4+ 4+  Shoulder external rotation _1 Middle trapezius      Lower trapezius      Elbow flexion 4  4+ 4+  Elbow extension _2 Wrist flexion      Wrist extension      Wrist ulnar deviation      Wrist radial deviation      Wrist pronation      Wrist supination      Grip strength (lbs)      (Blank rows = not tested)  SHOULDER SPECIAL TESTS:  Impingement tests: Neer impingement test: positive , Hawkins/Kennedy impingement test: positive , and Painful arc test: positive   Rotator cuff assessment: Empty can test: positive   Biceps assessment: Speed's test: positive   JOINT MOBILITY TESTING:  Sharp pain w/abd at 90d, increased muscle guarding with all PROM   PALPATION:  TTP R biceps, tight upper trap   TODAY'S TREATMENT:  05/16/22 UBE L2.5 x3 min each Seated rows & lats 20lb 2x10 Shoulder Ext 5lb 2x10 ER yellow 2x10 Triceps Ext 15lb 2x10 Chest press 10lb 2x10     05/09/22 UBE L2  x3 minutes forward/3 minutes backward  IR towel stretch RUE 4x10'' Triceps Ext 20lb 2x10 Seated rows & lats 20lb 2x10 Chest press 10lb 2x10 Shoulder Er red 2x10   05/02/22 UBE L2 x3 minutes forward/3 minutes backward  Seated Rows 20lb 2x10 Seated lats 20lb 2x10 Chest press 10lb x5, 5lb x10 Shoulder Ext 5lb 2x10  Shoulder ER red 2x10 Shoulder Flex 2lb 2x10  04/19/22  Objective measures/education as appropriate and goal review/POC   TherEx  UBE L2 x3 minutes forward/3 minutes backward  R Shoulder flexion 3# 2x10  R Shoulder ABD 3# 2x10  Shoulder  flexion stretch on door 10x5 seconds with pillowcase    04/02/22 UBE L2.9 x3 min each  Rows 15lb 2x10  Lats 15lb 2x10 Shoulder Ext 5lb 2x10 ER red 2x10 Shoulder Flex 2lb 2x10 Shoulder Abd 1lb 2x10 OHP 1lb WaTE 2x10   03/22/22 UBE 1.5 x 3 min each  Seated ER red 2x10 Horiz Abd yellow 2x10 Flexion 2lb 2x10 Shoulder abd 1lb 2x10  Rows 10lb 2x10 Lats 15lb 2x10 Shoulder Ext 5lb 2x10    PATIENT EDUCATION: Education details: POC Person educated: Patient Education method: Explanation Education comprehension: verbalized understanding   HOME EXERCISE PROGRAM: [id number]  ASSESSMENT:  CLINICAL IMPRESSION:  Jetta arrives today doing OK, She continues to report multiple sources of pain and it was unclear as to what's causing her pain- surgical pain vs pain of other origin. Interventions focused on postural and shoulder strength. Pt advised to return to MD about the multiple areas of pain and agreed to be discharged at this time    OBJECTIVE IMPAIRMENTS decreased ROM, decreased strength, impaired UE functional use, postural dysfunction, and pain.   ACTIVITY LIMITATIONS carrying, lifting, dressing, reach over head, and hygiene/grooming  PARTICIPATION LIMITATIONS: cleaning, laundry, shopping, and yard work  [employer] POTENTIAL: Good  CLINICAL DECISION MAKING: Stable/uncomplicated  EVALUATION COMPLEXITY: Low   GOALS: Goals reviewed with patient? Yes  SHORT TERM GOALS: Target date: 03/29/22  Patient will be independent with initial HEP.  Goal status: 9/28- MET 1x/day    LONG TERM GOALS: Target date: 04/26/22  Patient will be independent with advanced/ongoing HEP to improve outcomes and carryover.  Goal status: Met  2.  Patient will report <2/10 at worst in R shoulder pain to improve QOL.  Goal status: 9/28- ONGOING no more than 3-4/10  3.  Patient to demonstrate improved upright posture with posterior shoulder girdle engaged to promote improved glenohumeral joint  mobility. Goal status: Met 05/16/22  4.  Patient to improve R shoulder AROM to [facility] without pain provocation to allow for increased ease with ADLs.  Goal status: IN PROGRESS Met 05/16/22  5.  Patient will demonstrate improved functional UE strength by 1 grade in all weak muscle groups.  Goal status: 9/28- ONGOING   6  Patient will report 21 on FOTO to demonstrate improved functional ability.  Baseline: 48 Goal status: Met  PLAN: PT FREQUENCY: 1x/week  PT DURATION: 4 weeks  PLANNED INTERVENTIONS: Therapeutic exercises, Therapeutic activity, Neuromuscular re-education, Balance training, Gait training, Patient/Family education, Self Care, Joint mobilization, Dry Needling, Electrical stimulation, Cryotherapy, Moist heat, Taping, Vasopneumatic device, Ultrasound, Ionotophoresis 30m/ml Dexamethasone, and Manual therapy  PLAN FOR NEXT SESSION: Assess HEP tolerance, add shoulder strengthening. 4 more visits then DC per her request   PHYSICAL THERAPY DISCHARGE SUMMARY  Visits from Start of Care: 13   Patient agrees to discharge. Patient goals were partially met. Patient is being discharged due to maximized rehab potential.   RCheri Fowler  PTA 05/16/2022, 12:59 PM

## 2022-07-06 LAB — LIPID PANEL
Chol/HDL Ratio: 2.7 ratio (ref 0.0–4.4)
Cholesterol, Total: 159 mg/dL (ref 100–199)
HDL: 59 mg/dL (ref >39)
LDL Chol Calc (NIH): 86 mg/dL (ref 0–99)
Triglycerides: 70 mg/dL (ref 0–149)
VLDL Cholesterol Cal: 14 mg/dL (ref 5–40)

## 2022-07-10 ENCOUNTER — Encounter (HOSPITAL_BASED_OUTPATIENT_CLINIC_OR_DEPARTMENT_OTHER): Payer: Self-pay | Admitting: Internal Medicine

## 2022-07-10 ENCOUNTER — Ambulatory Visit (HOSPITAL_BASED_OUTPATIENT_CLINIC_OR_DEPARTMENT_OTHER): Payer: Medicaid Other | Admitting: Internal Medicine

## 2022-07-10 VITALS — BP 138/82 | HR 88 | Ht 63.0 in | Wt 184.0 lb

## 2022-07-10 DIAGNOSIS — E119 Type 2 diabetes mellitus without complications: Secondary | ICD-10-CM

## 2022-07-10 DIAGNOSIS — Z940 Kidney transplant status: Secondary | ICD-10-CM

## 2022-07-10 DIAGNOSIS — E785 Hyperlipidemia, unspecified: Secondary | ICD-10-CM

## 2022-07-10 MED ORDER — ATORVASTATIN CALCIUM 40 MG PO TABS: 40.0000 mg | ORAL_TABLET | Freq: Every day | ORAL | 3 refills | Status: DC

## 2022-07-10 NOTE — Patient Instructions (Signed)
Medication Instructions:  INCREASE atorvastatin to '40mg'$  daily   Your goal LDL is less than 70   *If you need a refill on your cardiac medications before your next appointment, please call your pharmacy*   Lab Work: FASTING lab work to check cholesterol in 6 months   If you have labs (blood work) drawn today and your tests are completely normal, you will receive your results only by: [facility] (if you have MyChart) OR A paper copy in the mail If you have any lab test that is abnormal or we need to change your treatment, we will call you to review the results.   Testing/Procedures: NONE   Follow-Up: At [facility], you and your health needs are our priority.  As part of our continuing mission to provide you with exceptional heart care, we have created designated Provider Care Teams.  These Care Teams include your primary Cardiologist (physician) and Advanced Practice Providers (APPs -  Physician Assistants and Nurse Practitioners) who all work together to provide you with the care you need, when you need it.  We recommend signing up for the patient portal called "MyChart".  Sign up information is provided on this After Visit Summary.  MyChart is used to connect with patients for Virtual Visits (Telemedicine).  Patients are able to view lab/test results, encounter notes, upcoming appointments, etc.  Non-urgent messages can be sent to your provider as well.   To learn more about what you can do with MyChart, go to [email or web address].    Your next appointment:   6 month(s)  The format for your next appointment:   In Person  Provider:   Lyman Bishop MD - lipid clinic

## 2022-07-10 NOTE — Progress Notes (Signed)
LIPID CLINIC CONSULT NOTE  Chief Complaint:  Follow-up dyslipidemia  Primary Care Physician: Berkley Harvey, NP  Primary Cardiologist:  Quay Burow, MD  HPI:  Maureen Barnes is a 61 y.o. female who is being seen today for the evaluation of dyslipidemia at the request of Berkley Harvey, NP. this is a pleasant 61 year old female kindly referred by Dr. Gwenlyn Found for evaluation management of dyslipidemia.  She has a history of type 2 diabetes, chronic hepatitis C, hypertension and end-stage renal disease likely secondary to these risk factors and is status post renal transplant in 2020.  She is immunosuppressed.  I think she is known to have any coronary disease however has had elevated cholesterol.  Particularly her triglycerides have been high in the past.  This could be driven by immunosuppressive medications as well.  Unfortunately she cannot come off these medicines.  Her LDL when she was seen by Dr. Gwenlyn Found was 116 however recently repeated labs showed total cholesterol 167, triglycerides 137, HDL 49 and LDL of 96.  She is on 20 mg of atorvastatin but she reported after increasing atorvastatin from 20 to 40 mg at night that she had side effects.  Ideally, would like to target her LDL less than 70 given diabetes with complications.  11/09/2021  Maureen Barnes returns today for follow-up of her dyslipidemia.  When I last saw her I recommended starting ezetimibe 10 mg daily in addition to low-dose atorvastatin.  This is because she reported she cannot take increased doses of statin.  Unfortunately, she never started the ezetimibe due to concerns about GI side effects and she has underlying gastroparesis.  Cholesterol has increased since her last visit now total 186, triglycerides 84, HDL 62 and LDL 109 (up from 96).  07/10/2022  Maureen Barnes returns today for follow-up.  She has had further improvement in her lipids but did not tolerate ezetimibe.  She decided to increase her atorvastatin from  10 mg slowly up to 30 mg daily.  Total cholesterol now 159 (down from 186), LDL is now at 86 (down from 109).  Her target LDL is less than 70.  She seems to be tolerating the 30 mg atorvastatin dose.  PMHx:  Past Medical History:  Diagnosis Date   Anemia    Cervical disc disease    Chronic kidney disease (CKD), stage V (HCC)    Diabetes mellitus    Hep C w/o coma, chronic (HCC)    Hypertension    Lumbar disc disease    Peritoneal dialysis status ([place])    Renal disorder     Past Surgical History:  Procedure Laterality Date   TUBAL LIGATION      FAMHx:  Family History  Problem Relation Age of Onset   Hypertension Mother    Cancer Mother    Breast cancer Mother    Diabetes Father    Hypertension Father     SOCHx:   reports that she has never smoked. She has never used smokeless tobacco. She reports that she does not drink alcohol and does not use drugs.  ALLERGIES:  Allergies  Allergen Reactions   Peanut Oil Anaphylaxis, Itching and Swelling    Tree nuts   Shellfish-Derived Products     Lips and thraot swell   Iodine Itching and Swelling    Shellfish-lips swell itching.   Peanut-Containing Drug Products Itching and Swelling   Shellfish Allergy Itching and Swelling    ROS: Pertinent items noted in HPI and remainder of  comprehensive ROS otherwise negative.  HOME MEDS: Current Outpatient Medications on File Prior to Visit  Medication Sig Dispense Refill   amLODipine (NORVASC) 5 MG tablet Take 1 tablet by mouth daily.     aspirin EC 81 MG tablet Take 81 mg by mouth daily.     atorvastatin (LIPITOR) 20 MG tablet Take 30 mg by mouth at bedtime.     b complex-vitamin c-folic acid (NEPHRO-VITE) 0.8 MG TABS tablet Take 1 tablet by mouth daily.  6   chlorthalidone (HYGROTON) 25 MG tablet Take 12.5 mg by mouth daily.     ciclopirox (PENLAC) 8 % solution Apply topically at bedtime. Apply over nail and surrounding skin. Apply daily over previous coat. After seven (7) days,  may remove with alcohol and continue cycle. 6.6 mL 4   Continuous Blood Gluc Receiver (DEXCOM G6 RECEIVER) DEVI Use as directed for continuous glucose monitoring.     Continuous Blood Gluc Sensor (DEXCOM G6 SENSOR) MISC Inject 1 sensor to the skin every 10 days for continuous glucose monitoring.     Continuous Blood Gluc Transmit (DEXCOM G6 TRANSMITTER) MISC Use as directed for continuous glucose monitoring. Reuse transmitter for 90 days then discard and replace.     diphenhydrAMINE (BENADRYL) 25 MG tablet Take 1 tablet (25 mg total) by mouth at bedtime as needed. 30 tablet 0   docusate sodium (COLACE) 100 MG capsule Take 100 mg by mouth 2 (two) times daily.     ENVARSUS XR 4 MG TB24 Take 2 tablets by mouth daily.     famotidine (PEPCID) 10 MG tablet Take 10 mg by mouth daily as needed.     fluticasone (FLONASE) 50 MCG/ACT nasal spray Place 2 sprays into both nostrils daily as needed.     HUMALOG 100 UNIT/ML injection 100 Units.     hydrOXYzine (ATARAX) 10 MG tablet Take 10 mg by mouth 3 (three) times daily as needed.     insulin glargine (LANTUS) 100 UNIT/ML injection Inject 20 Units into the skin daily. As Needed     K Phos Mono-Sod Phos Di & Mono (K-PHOS-NEUTRAL) [phone] MG TABS Take 1 tablet by mouth daily.     loratadine (CLARITIN) 10 MG tablet Take 10 mg by mouth as directed.     magnesium oxide (MAG-OX) 400 MG tablet Take 2 tablets by mouth 2 (two) times daily.     methocarbamol (ROBAXIN) 500 MG tablet Take 500 mg by mouth every 8 (eight) hours as needed.     metoprolol succinate (TOPROL-XL) 50 MG 24 hr tablet Take 1 tablet ('50mg'$ ) twice daily. (Patient taking differently: 75 mg daily. Take 50 mg in the morning and 25 mg in the evening) 270 tablet 1   mycophenolate (MYFORTIC) 180 MG EC tablet Take 180 mg by mouth in the morning, at noon, and at bedtime.     omeprazole (PRILOSEC) 20 MG capsule Take 20 mg by mouth 2 (two) times daily.     ondansetron (ZOFRAN) 4 MG tablet Take 1 tablet (4  mg total) by mouth every 8 (eight) hours as needed for nausea or vomiting. 45 tablet 1   predniSONE (DELTASONE) 5 MG tablet Take 5 mg by mouth daily.     simethicone (MYLICON) 80 MG chewable tablet Chew 80 mg by mouth every 6 (six) hours as needed for flatulence.     sulfamethoxazole-trimethoprim (BACTRIM) 400-80 MG tablet Take 1 tablet by mouth 3 (three) times a week.     tiZANidine (ZANAFLEX) 2 MG tablet Take by mouth.  TYLENOL 500 MG tablet Take 1,000 mg by mouth 3 (three) times daily as needed.     No current facility-administered medications on file prior to visit.    LABS/IMAGING: No results found for this or any previous visit (from the past 48 hour(s)). No results found.  LIPID PANEL:    Component Value Date/Time   CHOL 159 07/06/2022 0843   TRIG 70 07/06/2022 0843   HDL 59 07/06/2022 0843   CHOLHDL 2.7 07/06/2022 0843   LDLCALC 86 07/06/2022 0843    WEIGHTS: Wt Readings from Last 3 Encounters:  07/10/22 184 lb (83.5 kg)  04/17/22 182 lb 9.6 oz (82.8 kg)  03/21/22 177 lb (80.3 kg)    VITALS: BP 138/82   Pulse 88   Ht '5\' 3"'$  (1.6 m)   Wt 184 lb (83.5 kg)   LMP 06/26/2011   BMI 32.59 kg/m   EXAM: Deferred  EKG: Deferred  ASSESSMENT: Mixed dyslipidemia, goal LDL less than 70 Type 2 diabetes with complications Hypertension End-stage renal disease status post renal transplant-immunosuppressed History of palpitations  PLAN: 1.   Maureen Barnes has had improvement in her lipids with a combination of diet and increase in her statin.  I recommend further increase of atorvastatin up to 40 mg daily which I think will be well-tolerated and hopefully get her to target LDL less than 70.  Lan repeat lipids and follow-up with me in 6 months or sooner as necessary.  Pixie Casino, MD, [facility], [place] Director of the Advanced Lipid Disorders &  Cardiovascular Risk Reduction Clinic Diplomate of the American Board of Clinical  Lipidology Attending Cardiologist  Direct Dial: [phone]  Fax: [phone]  Website:  [email or web address] [email or web address] Ersa Delaney 07/10/2022, 1:23 PM

## 2022-08-29 ENCOUNTER — Ambulatory Visit
Admission: RE | Admit: 2022-08-29 | Discharge: 2022-08-29 | Disposition: A | Discharge status: Home / Self Care | Payer: Medicaid Other | Source: Ambulatory Visit | Attending: Urgent Care | Admitting: Urgent Care

## 2022-08-29 VITALS — BP 148/92 | HR 83 | Temp 98.5°F | Resp 20

## 2022-08-29 DIAGNOSIS — N186 End stage renal disease: Secondary | ICD-10-CM | POA: Diagnosis not present

## 2022-08-29 DIAGNOSIS — D84821 Immunodeficiency due to drugs: Secondary | ICD-10-CM | POA: Insufficient documentation

## 2022-08-29 DIAGNOSIS — Z992 Dependence on renal dialysis: Secondary | ICD-10-CM | POA: Insufficient documentation

## 2022-08-29 DIAGNOSIS — Z1152 Encounter for screening for COVID-19: Secondary | ICD-10-CM | POA: Diagnosis not present

## 2022-08-29 DIAGNOSIS — E1122 Type 2 diabetes mellitus with diabetic chronic kidney disease: Secondary | ICD-10-CM | POA: Diagnosis not present

## 2022-08-29 DIAGNOSIS — Z79624 Long term (current) use of inhibitors of nucleotide synthesis: Secondary | ICD-10-CM | POA: Diagnosis not present

## 2022-08-29 DIAGNOSIS — B349 Viral infection, unspecified: Secondary | ICD-10-CM

## 2022-08-29 DIAGNOSIS — I120 Hypertensive chronic kidney disease with stage 5 chronic kidney disease or end stage renal disease: Secondary | ICD-10-CM | POA: Insufficient documentation

## 2022-08-29 DIAGNOSIS — Z940 Kidney transplant status: Secondary | ICD-10-CM

## 2022-08-29 DIAGNOSIS — Z7952 Long term (current) use of systemic steroids: Secondary | ICD-10-CM | POA: Diagnosis not present

## 2022-08-29 DIAGNOSIS — E119 Type 2 diabetes mellitus without complications: Secondary | ICD-10-CM | POA: Diagnosis not present

## 2022-08-29 DIAGNOSIS — Z794 Long term (current) use of insulin: Secondary | ICD-10-CM | POA: Diagnosis not present

## 2022-08-29 MED ORDER — ONDANSETRON 8 MG PO TBDP: 8.0000 mg | ORAL_TABLET | Freq: Three times a day (TID) | ORAL | 0 refills | Status: AC | PRN

## 2022-08-29 MED ORDER — LOPERAMIDE HCL 2 MG PO CAPS: 2.0000 mg | ORAL_CAPSULE | Freq: Two times a day (BID) | ORAL | 0 refills | Status: AC | PRN

## 2022-08-29 MED ORDER — PROMETHAZINE-DM 6.25-15 MG/5ML PO SYRP: 2.5000 mL | ORAL_SOLUTION | Freq: Three times a day (TID) | ORAL | 0 refills | Status: AC | PRN

## 2022-08-29 NOTE — Discharge Instructions (Signed)
We will notify you of your test results as they arrive and may take between about 24 hours.  I encourage you to sign up for MyChart if you have not already done so as this can be the easiest way for [place] to communicate results to you online or through a phone app.  Generally, we only contact you if it is a positive test result.  In the meantime, if you develop worsening symptoms including fever, chest pain, shortness of breath despite our current treatment plan then please report to the emergency room as this may be a sign of worsening status from possible viral infection.  Otherwise, we will manage this as a viral syndrome. For sore throat or cough try using a honey-based tea. Use 3 teaspoons of honey with juice squeezed from half lemon. Place shaved pieces of ginger into 1/2-1 cup of water and warm over stove top. Then mix the ingredients and repeat every 4 hours as needed. Please take Tylenol 500mg -650mg  every 6 hours for aches and pains, fevers. Hydrate very well with at least 2 liters of water. Eat light meals such as soups to replenish electrolytes and soft fruits, veggies. Start an antihistamine like Zyrtec for postnasal drainage, sinus congestion.  You can take this together with pseudoephedrine (Sudafed) at a dose of 30 mg 2-3 times a day as needed for the same kind of congestion.  Use the cough medications as needed.

## 2022-08-29 NOTE — ED Provider Notes (Signed)
[facility]  Note:  This document was prepared using Systems analyst and may include unintentional dictation errors.  MRN: [id number] DOB: 03/01/1961  Subjective:   Maureen Barnes is a 62 y.o. female presenting for 3-day history of acute onset runny and stuffy nose, sinus headache, congestion, nausea without vomiting, upper body pains, bloating, belly pain, gassiness.  Started having diarrhea last night into today.  She is staying hydrated.  Patient is s/p renal transplant 02/28/2019. Last renal check in 02/2022 was within normal limits. Patient is taking Myfortic and prednisone daily for immunosuppression.  Has type 2 diabetes treated with insulin.  No current facility-administered medications for this encounter.  Current Outpatient Medications:    amLODipine (NORVASC) 5 MG tablet, Take 1 tablet by mouth daily., Disp: , Rfl:    aspirin EC 81 MG tablet, Take 81 mg by mouth daily., Disp: , Rfl:    atorvastatin (LIPITOR) 40 MG tablet, Take 1 tablet (40 mg total) by mouth at bedtime., Disp: 90 tablet, Rfl: 3   b complex-vitamin c-folic acid (NEPHRO-VITE) 0.8 MG TABS tablet, Take 1 tablet by mouth daily., Disp: , Rfl: 6   chlorthalidone (HYGROTON) 25 MG tablet, Take 12.5 mg by mouth daily., Disp: , Rfl:    ciclopirox (PENLAC) 8 % solution, Apply topically at bedtime. Apply over nail and surrounding skin. Apply daily over previous coat. After seven (7) days, may remove with alcohol and continue cycle., Disp: 6.6 mL, Rfl: 4   Continuous Blood Gluc Receiver (DEXCOM G6 RECEIVER) DEVI, Use as directed for continuous glucose monitoring., Disp: , Rfl:    Continuous Blood Gluc Sensor (DEXCOM G6 SENSOR) MISC, Inject 1 sensor to the skin every 10 days for continuous glucose monitoring., Disp: , Rfl:    Continuous Blood Gluc Transmit (DEXCOM G6 TRANSMITTER) MISC, Use as directed for continuous glucose monitoring. Reuse transmitter for 90 days then discard and  replace., Disp: , Rfl:    diphenhydrAMINE (BENADRYL) 25 MG tablet, Take 1 tablet (25 mg total) by mouth at bedtime as needed., Disp: 30 tablet, Rfl: 0   docusate sodium (COLACE) 100 MG capsule, Take 100 mg by mouth 2 (two) times daily., Disp: , Rfl:    ENVARSUS XR 4 MG TB24, Take 2 tablets by mouth daily., Disp: , Rfl:    famotidine (PEPCID) 10 MG tablet, Take 10 mg by mouth daily as needed., Disp: , Rfl:    fluticasone (FLONASE) 50 MCG/ACT nasal spray, Place 2 sprays into both nostrils daily as needed., Disp: , Rfl:    HUMALOG 100 UNIT/ML injection, 100 Units., Disp: , Rfl:    hydrOXYzine (ATARAX) 10 MG tablet, Take 10 mg by mouth 3 (three) times daily as needed., Disp: , Rfl:    insulin glargine (LANTUS) 100 UNIT/ML injection, Inject 20 Units into the skin daily. As Needed, Disp: , Rfl:    K Phos Mono-Sod Phos Di & Mono (K-PHOS-NEUTRAL) [phone] MG TABS, Take 1 tablet by mouth daily., Disp: , Rfl:    loratadine (CLARITIN) 10 MG tablet, Take 10 mg by mouth as directed., Disp: , Rfl:    magnesium oxide (MAG-OX) 400 MG tablet, Take 2 tablets by mouth 2 (two) times daily., Disp: , Rfl:    methocarbamol (ROBAXIN) 500 MG tablet, Take 500 mg by mouth every 8 (eight) hours as needed., Disp: , Rfl:    metoprolol succinate (TOPROL-XL) 50 MG 24 hr tablet, Take 1 tablet ('50mg'$ ) twice daily. (Patient taking differently: 75 mg daily. Take 50  mg in the morning and 25 mg in the evening), Disp: 270 tablet, Rfl: 1   mycophenolate (MYFORTIC) 180 MG EC tablet, Take 180 mg by mouth in the morning, at noon, and at bedtime., Disp: , Rfl:    omeprazole (PRILOSEC) 20 MG capsule, Take 20 mg by mouth 2 (two) times daily., Disp: , Rfl:    ondansetron (ZOFRAN) 4 MG tablet, Take 1 tablet (4 mg total) by mouth every 8 (eight) hours as needed for nausea or vomiting., Disp: 45 tablet, Rfl: 1   predniSONE (DELTASONE) 5 MG tablet, Take 5 mg by mouth daily., Disp: , Rfl:    simethicone (MYLICON) 80 MG chewable tablet, Chew 80 mg  by mouth every 6 (six) hours as needed for flatulence., Disp: , Rfl:    sulfamethoxazole-trimethoprim (BACTRIM) 400-80 MG tablet, Take 1 tablet by mouth 3 (three) times a week., Disp: , Rfl:    tiZANidine (ZANAFLEX) 2 MG tablet, Take by mouth., Disp: , Rfl:    TYLENOL 500 MG tablet, Take 1,000 mg by mouth 3 (three) times daily as needed., Disp: , Rfl:    Allergies  Allergen Reactions   Peanut Oil Anaphylaxis, Itching and Swelling    Tree nuts   Shellfish-Derived Products     Lips and thraot swell   Iodine Itching and Swelling    Shellfish-lips swell itching.   Peanut-Containing Drug Products Itching and Swelling   Shellfish Allergy Itching and Swelling    Past Medical History:  Diagnosis Date   Anemia    Cervical disc disease    Chronic kidney disease (CKD), stage V (HCC)    Diabetes mellitus    Hep C w/o coma, chronic (HCC)    Hypertension    Lumbar disc disease    Peritoneal dialysis status ([place])    Renal disorder      Past Surgical History:  Procedure Laterality Date   TUBAL LIGATION      Family History  Problem Relation Age of Onset   Hypertension Mother    Cancer Mother    Breast cancer Mother    Diabetes Father    Hypertension Father     Social History   Tobacco Use   Smoking status: Never   Smokeless tobacco: Never  Vaping Use   Vaping Use: Never used  Substance Use Topics   Alcohol use: No   Drug use: No    ROS   Objective:   Vitals: BP (!) 163/94 (BP Location: Left Arm)   Pulse 83   Temp 98.5 F (36.9 C) (Oral)   Resp 20   LMP 06/26/2011   SpO2 97%   Physical Exam Constitutional:      General: She is not in acute distress.    Appearance: Normal appearance. She is well-developed and normal weight. She is not ill-appearing, toxic-appearing or diaphoretic.  HENT:     Head: Normocephalic and atraumatic.     Right Ear: Tympanic membrane, ear canal and external ear normal. No drainage or tenderness. No middle ear effusion. There is no  impacted cerumen. Tympanic membrane is not erythematous or bulging.     Left Ear: Tympanic membrane, ear canal and external ear normal. No drainage or tenderness.  No middle ear effusion. There is no impacted cerumen. Tympanic membrane is not erythematous or bulging.     Nose: Congestion present. No rhinorrhea.     Mouth/Throat:     Mouth: Mucous membranes are moist. No oral lesions.     Pharynx: No pharyngeal swelling, oropharyngeal  exudate, posterior oropharyngeal erythema or uvula swelling.     Tonsils: No tonsillar exudate or tonsillar abscesses. 0 on the right. 0 on the left.  Eyes:     General: No scleral icterus.       Right eye: No discharge.        Left eye: No discharge.     Extraocular Movements: Extraocular movements intact.     Right eye: Normal extraocular motion.     Left eye: Normal extraocular motion.     Conjunctiva/sclera: Conjunctivae normal.  Cardiovascular:     Rate and Rhythm: Normal rate and regular rhythm.     Heart sounds: Normal heart sounds. No murmur heard.    No friction rub. No gallop.  Pulmonary:     Effort: Pulmonary effort is normal. No respiratory distress.     Breath sounds: No stridor. No wheezing, rhonchi or rales.  Chest:     Chest wall: No tenderness.  Abdominal:     General: Bowel sounds are normal. There is no distension.     Palpations: Abdomen is soft. There is no mass.     Tenderness: There is no abdominal tenderness. There is no right CVA tenderness, left CVA tenderness, guarding or rebound.  Musculoskeletal:     Cervical back: Normal range of motion and neck supple.  Lymphadenopathy:     Cervical: No cervical adenopathy.  Skin:    General: Skin is warm and dry.  Neurological:     General: No focal deficit present.     Mental Status: She is alert and oriented to person, place, and time.  Psychiatric:        Mood and Affect: Mood normal.        Behavior: Behavior normal.        Thought Content: Thought content normal.         Judgment: Judgment normal.     Assessment and Plan :   PDMP not reviewed this encounter.  1. Acute viral syndrome   2. Renal transplant recipient   3. Type 2 diabetes mellitus treated with insulin (HCC)     No signs of an acute abdomen.  Patient is high risk given her diabetes and immunosuppression from being a renal transplant recipient.  At this stage, we will hold off on antibiotic use. Will manage for viral illness such as viral URI, viral syndrome, viral rhinitis, COVID-19. Recommended supportive care. Offered scripts for symptomatic relief. Testing is pending. Counseled patient on potential for adverse effects with medications prescribed/recommended today, ER and return-to-clinic precautions discussed, patient verbalized understanding.   If patient test positive for COVID-19, recommend she undergo Paxlovid.   Jaynee Eagles, [place] 08/29/22 1241

## 2022-08-29 NOTE — ED Triage Notes (Signed)
Pt c/o generalized abd pain, bloating, gas, diarrhea started last night-nausea x 3 days-NAD-steady gait

## 2022-08-30 LAB — SARS CORONAVIRUS 2 (TAT 6-24 HRS): SARS Coronavirus 2: NEGATIVE

## 2022-10-05 ENCOUNTER — Other Ambulatory Visit (HOSPITAL_BASED_OUTPATIENT_CLINIC_OR_DEPARTMENT_OTHER): Payer: Self-pay

## 2022-10-05 ENCOUNTER — Encounter (HOSPITAL_COMMUNITY): Payer: Self-pay

## 2022-10-05 MED ORDER — COVID-19 MRNA 2023-2024 VACCINE (COMIRNATY) 0.3 ML INJECTION
INTRAMUSCULAR | 0 refills | Status: AC
  Filled 2022-10-05 (×2): qty 0.3, 1d supply, fill #0

## 2022-12-14 ENCOUNTER — Other Ambulatory Visit: Payer: Self-pay | Admitting: Nurse Practitioner

## 2022-12-14 DIAGNOSIS — Z1231 Encounter for screening mammogram for malignant neoplasm of breast: Secondary | ICD-10-CM

## 2023-01-02 ENCOUNTER — Other Ambulatory Visit: Payer: Self-pay

## 2023-01-02 MED ORDER — METOPROLOL SUCCINATE ER 50 MG PO TB24: ORAL_TABLET | ORAL | 1 refills | Status: DC

## 2023-01-03 ENCOUNTER — Ambulatory Visit
Admission: RE | Admit: 2023-01-03 | Discharge: 2023-01-03 | Disposition: A | Discharge status: Home / Self Care | Payer: Medicaid Other | Source: Ambulatory Visit | Attending: Nurse Practitioner | Admitting: Nurse Practitioner

## 2023-01-03 DIAGNOSIS — Z1231 Encounter for screening mammogram for malignant neoplasm of breast: Secondary | ICD-10-CM

## 2023-04-09 ENCOUNTER — Encounter (HOSPITAL_COMMUNITY): Payer: Self-pay

## 2023-04-12 LAB — LIPID PANEL
Chol/HDL Ratio: 2.9 ratio
Cholesterol, Total: 183 mg/dL
HDL: 63 mg/dL (ref >39)
LDL Chol Calc (NIH): 105 mg/dL
Triglycerides: 80 mg/dL
VLDL Cholesterol Cal: 15 mg/dL (ref 5–40)

## 2023-04-16 ENCOUNTER — Encounter: Payer: Self-pay | Admitting: Internal Medicine

## 2023-04-16 ENCOUNTER — Ambulatory Visit: Payer: Medicaid Other | Attending: Cardiovascular Disease | Admitting: Internal Medicine

## 2023-04-16 VITALS — BP 132/84 | Ht 63.0 in | Wt 178.0 lb

## 2023-04-16 DIAGNOSIS — Z940 Kidney transplant status: Secondary | ICD-10-CM | POA: Diagnosis present

## 2023-04-16 DIAGNOSIS — E119 Type 2 diabetes mellitus without complications: Secondary | ICD-10-CM | POA: Insufficient documentation

## 2023-04-16 DIAGNOSIS — E785 Hyperlipidemia, unspecified: Secondary | ICD-10-CM | POA: Diagnosis present

## 2023-04-16 MED ORDER — ATORVASTATIN CALCIUM 20 MG PO TABS: ORAL_TABLET | ORAL | 3 refills | Status: DC

## 2023-04-16 NOTE — Patient Instructions (Signed)
  Medication Instructions:  TAKE atorvastatin 20mg  alternating with 40mg  every other day   *If you need a refill on your cardiac medications before your next appointment, please call your pharmacy*   Lab Work: FASTING lipid panel in 4 months  If you have labs (blood work) drawn today and your tests are completely normal, you will receive your results only by: MyChart Message (if you have MyChart) OR A paper copy in the mail If you have any lab test that is abnormal or we need to change your treatment, we will call you to review the results.   Follow-Up: At [facility], you and your health needs are our priority.  As part of our continuing mission to provide you with exceptional heart care, we have created designated Provider Care Teams.  These Care Teams include your primary Cardiologist (physician) and Advanced Practice Providers (APPs -  Physician Assistants and Nurse Practitioners) who all work together to provide you with the care you need, when you need it.  We recommend signing up for the patient portal called "MyChart".  Sign up information is provided on this After Visit Summary.  MyChart is used to connect with patients for Virtual Visits (Telemedicine).  Patients are able to view lab/test results, encounter notes, upcoming appointments, etc.  Non-urgent messages can be sent to your provider as well.   To learn more about what you can do with MyChart, go to [email or web address].    Your next appointment:   4 months with Dr. Rennis Golden -- lipid clinic

## 2023-04-16 NOTE — Addendum Note (Signed)
Addended by: Lindell Spar on: 04/16/2023 09:08 AM   Modules accepted: Orders

## 2023-04-16 NOTE — Progress Notes (Signed)
Virtual Visit via Video Note   Because of Maureen Barnes's co-morbid illnesses, she is at least at moderate risk for complications without adequate follow up.  This format is felt to be most appropriate for this patient at this time.  All issues noted in this document were discussed and addressed.  A limited physical exam was performed with this format.  Please refer to the patient's chart for her consent to telehealth for [facility].      Date:  04/16/2023   ID:  Maureen Barnes, DOB 1961-02-06, MRN [id number] The patient was identified using 2 identifiers.  Evaluation Performed:  Follow-Up Visit  Patient Location:  [address] [place] [address]  Provider location:   [address], [address] [place], [place] [address]  PCP:  Iona Hansen, NP  Cardiologist:  Nanetta Batty, MD Electrophysiologist:  None   Chief Complaint:  Follow-up dyslipidemia  History of Present Illness:    Maureen Barnes is a 62 y.o. female who presents via audio/video conferencing for a telehealth visit today.  this is a pleasant 62 year old female kindly referred by Dr. Allyson Sabal for evaluation management of dyslipidemia.  She has a history of type 2 diabetes, chronic hepatitis C, hypertension and end-stage renal disease likely secondary to these risk factors and is status post renal transplant in 2020.  She is immunosuppressed.  I think she is known to have any coronary disease however has had elevated cholesterol.  Particularly her triglycerides have been high in the past.  This could be driven by immunosuppressive medications as well.  Unfortunately she cannot come off these medicines.  Her LDL when she was seen by Dr. Allyson Sabal was 116 however recently repeated labs showed total cholesterol 167, triglycerides 137, HDL 49 and LDL of 96.  She is on 20 mg of atorvastatin but she reported after increasing atorvastatin from 20 to 40 mg at night that she had side effects.  Ideally, would  like to target her LDL less than 70 given diabetes with complications.   11/09/2021   Maureen Barnes returns today for follow-up of her dyslipidemia.  When I last saw her I recommended starting ezetimibe 10 mg daily in addition to low-dose atorvastatin.  This is because she reported she cannot take increased doses of statin.  Unfortunately, she never started the ezetimibe due to concerns about GI side effects and she has underlying gastroparesis.  Cholesterol has increased since her last visit now total 186, triglycerides 84, HDL 62 and LDL 109 (up from 96).   07/10/2022   Maureen Barnes returns today for follow-up.  She has had further improvement in her lipids but did not tolerate ezetimibe.  She decided to increase her atorvastatin from 10 mg slowly up to 30 mg daily.  Total cholesterol now 159 (down from 186), LDL is now at 86 (down from 109).  Her target LDL is less than 70.  She seems to be tolerating the 30 mg atorvastatin dose.  04/16/2023  Maureen Barnes returns today for follow-up. She has reported some myalgias on the 40 mg daily dose. She has reduced the dose to 20 mg daily and the cholesterol. With this, her cholesterol has gone up to TC 183, TG 80, HDL 63, and LDL 105. Sometimes she takes 30 mg.  Prior CV studies:   The following studies were reviewed today:  Chart reviewed  PMHx:  Past Medical History:  Diagnosis Date   Anemia    Cervical disc  disease    Chronic kidney disease (CKD), stage V (HCC)    Diabetes mellitus    Hep C w/o coma, chronic (HCC)    Hypertension    Lumbar disc disease    Peritoneal dialysis status (HCC)    Renal disorder     Past Surgical History:  Procedure Laterality Date   TUBAL LIGATION      FAMHx:  Family History  Problem Relation Age of Onset   Hypertension Mother    Cancer Mother    Breast cancer Mother    Diabetes Father    Hypertension Father     SOCHx:   reports that she has never smoked. She has never used smokeless tobacco. She  reports that she does not drink alcohol and does not use drugs.  ALLERGIES:  Allergies  Allergen Reactions   Peanut Oil Anaphylaxis, Itching and Swelling    Tree nuts   Shellfish-Derived Products     Lips and thraot swell   Iodine Itching and Swelling    Shellfish-lips swell itching.   Peanut-Containing Drug Products Itching and Swelling   Shellfish Allergy Itching and Swelling    MEDS:  Current Meds  Medication Sig   amLODipine (NORVASC) 5 MG tablet Take 1 tablet by mouth daily.   aspirin EC 81 MG tablet Take 81 mg by mouth daily.   atorvastatin (LIPITOR) 40 MG tablet Take 1 tablet (40 mg total) by mouth at bedtime.   b complex-vitamin c-folic acid (NEPHRO-VITE) 0.8 MG TABS tablet Take 1 tablet by mouth daily.   chlorthalidone (HYGROTON) 25 MG tablet Take 12.5 mg by mouth daily.   ciclopirox (PENLAC) 8 % solution Apply topically at bedtime. Apply over nail and surrounding skin. Apply daily over previous coat. After seven (7) days, may remove with alcohol and continue cycle.   Continuous Blood Gluc Receiver (DEXCOM G6 RECEIVER) DEVI Use as directed for continuous glucose monitoring.   Continuous Blood Gluc Sensor (DEXCOM G6 SENSOR) MISC Inject 1 sensor to the skin every 10 days for continuous glucose monitoring.   Continuous Blood Gluc Transmit (DEXCOM G6 TRANSMITTER) MISC Use as directed for continuous glucose monitoring. Reuse transmitter for 90 days then discard and replace.   diphenhydrAMINE (BENADRYL) 25 MG tablet Take 1 tablet (25 mg total) by mouth at bedtime as needed.   docusate sodium (COLACE) 100 MG capsule Take 100 mg by mouth 2 (two) times daily.   ENVARSUS XR 4 MG TB24 Take 2 tablets by mouth daily.   famotidine (PEPCID) 10 MG tablet Take 10 mg by mouth daily as needed.   fluticasone (FLONASE) 50 MCG/ACT nasal spray Place 2 sprays into both nostrils daily as needed.   HUMALOG 100 UNIT/ML injection 100 Units.   hydrOXYzine (ATARAX) 10 MG tablet Take 10 mg by mouth 3  (three) times daily as needed.   insulin glargine (LANTUS) 100 UNIT/ML injection Inject 20 Units into the skin daily. As Needed   K Phos Mono-Sod Phos Di & Mono (K-PHOS-NEUTRAL) [phone] MG TABS Take 1 tablet by mouth daily.   loperamide (IMODIUM) 2 MG capsule Take 1 capsule (2 mg total) by mouth 2 (two) times daily as needed for diarrhea or loose stools.   loratadine (CLARITIN) 10 MG tablet Take 10 mg by mouth as directed.   magnesium oxide (MAG-OX) 400 MG tablet Take 2 tablets by mouth 2 (two) times daily.   methocarbamol (ROBAXIN) 500 MG tablet Take 500 mg by mouth every 8 (eight) hours as needed.   metoprolol succinate (TOPROL-XL)  50 MG 24 hr tablet Take 1 tablet (50mg ) twice daily.   mycophenolate (MYFORTIC) 180 MG EC tablet Take 180 mg by mouth in the morning, at noon, and at bedtime.   omeprazole (PRILOSEC) 20 MG capsule Take 20 mg by mouth 2 (two) times daily.   ondansetron (ZOFRAN-ODT) 8 MG disintegrating tablet Take 1 tablet (8 mg total) by mouth every 8 (eight) hours as needed for nausea or vomiting.   predniSONE (DELTASONE) 5 MG tablet Take 5 mg by mouth daily.   promethazine-dextromethorphan (PROMETHAZINE-DM) 6.25-15 MG/5ML syrup Take 2.5 mLs by mouth 3 (three) times daily as needed for cough.   simethicone (MYLICON) 80 MG chewable tablet Chew 80 mg by mouth every 6 (six) hours as needed for flatulence.   sulfamethoxazole-trimethoprim (BACTRIM) 400-80 MG tablet Take 1 tablet by mouth 3 (three) times a week.   tiZANidine (ZANAFLEX) 2 MG tablet Take by mouth.   TYLENOL 500 MG tablet Take 1,000 mg by mouth 3 (three) times daily as needed.     ROS: Pertinent items noted in HPI and remainder of comprehensive ROS otherwise negative.  Labs/Other Tests and Data Reviewed:    Recent Labs: No results found for requested labs within last 365 days.   Recent Lipid Panel Lab Results  Component Value Date/Time   CHOL 183 04/11/2023 12:00 AM   TRIG 80 04/11/2023 12:00 AM   HDL 63  04/11/2023 12:00 AM   CHOLHDL 2.9 04/11/2023 12:00 AM   LDLCALC 105 04/11/2023 12:00 AM    Wt Readings from Last 3 Encounters:  04/16/23 178 lb (80.7 kg)  07/10/22 184 lb (83.5 kg)  04/17/22 182 lb 9.6 oz (82.8 kg)     Exam:    Vital Signs:  BP 132/84   Ht 5\' 3"  (1.6 m)   Wt 178 lb (80.7 kg)   LMP 06/26/2011   BMI 31.53 kg/m    General appearance: alert and no distress Lungs: no respiratory difficulty Abdomen: mildly obese Extremities: extremities normal, atraumatic, no cyanosis or edema Neurologic: Grossly normal  ASSESSMENT & PLAN:    Mixed dyslipidemia, goal LDL less than 70 Type 2 diabetes with complications Hypertension End-stage renal disease status post renal transplant-immunosuppressed History of palpitations  Cholesterol has gone up - she has been using a lower dose of her statin. She is willing to try an alternative regimen of 20 mg / 40 mg every other day.  Will prescribe the 20 mg tablets for her. Repeat lipid in 3-4 months and follow-up.  Patient Risk:   After full review of this patients clinical status, I feel that they are at least moderate risk at this time.  Time:   Today, I have spent 15 minutes with the patient with telehealth technology discussing dyslipidemia.     Medication Adjustments/Labs and Tests Ordered: Current medicines are reviewed at length with the patient today.  Concerns regarding medicines are outlined above.   Tests Ordered: No orders of the defined types were placed in this encounter.   Medication Changes: No orders of the defined types were placed in this encounter.   Disposition:  in 4 month(s)  Chrystie Nose, MD, [facility], FACP  [place]  [facility]  Medical Director of the Advanced Lipid Disorders &  Cardiovascular Risk Reduction Clinic Diplomate of the American Board of Clinical Lipidology Attending Cardiologist  Direct Dial: [phone]  Fax: [phone]  Website:  [email or web address]  Chrystie Nose, MD  04/16/2023 8:55 AM

## 2023-05-07 ENCOUNTER — Ambulatory Visit: Payer: Medicaid Other | Attending: Cardiovascular Disease | Admitting: Cardiovascular Disease

## 2023-05-07 ENCOUNTER — Encounter: Payer: Self-pay | Admitting: Cardiovascular Disease

## 2023-05-07 VITALS — BP 120/66 | HR 78 | Ht 63.0 in | Wt 178.0 lb

## 2023-05-07 DIAGNOSIS — E782 Mixed hyperlipidemia: Secondary | ICD-10-CM | POA: Diagnosis not present

## 2023-05-07 DIAGNOSIS — I10 Essential (primary) hypertension: Secondary | ICD-10-CM | POA: Insufficient documentation

## 2023-05-07 DIAGNOSIS — R002 Palpitations: Secondary | ICD-10-CM | POA: Insufficient documentation

## 2023-05-07 NOTE — Progress Notes (Signed)
05/07/2023 Maureen Barnes   May 27, 1961  [id number]  Primary Physician Iona Hansen, NP Primary Cardiologist: Runell Gess MD Nicholes Calamity, [place]  HPI:  Maureen Barnes is a 62 y.o. female moderately overweight single African-American female mother of 3 children referred by Dr. Donette Larry  for cardiovascular evaluation because of symptomatic palpitations.  I last saw her in the office 03/03/2021.  She has a history of true hypertension, hyperlipidemia and diabetes. She has never smoked. 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 She's had chronic renal insufficiency for years with anticipation of needing an AV fistula in the near future. She does complain of dyspnea on exertion and some atypical chest pain as well as symptomatic palpitations. She has chronic hepatitis C    She did get a renal transplant at [facility] in 2020.  She saw Azalee Course PA-C in the office for palpitations.  An event monitor showed PVCs.  She did complain of chest pain and shortness of breath and had a normal 2D echo and Myoview stress test.  Since I saw her 2 years ago otherwise she has remained stable.  Her renal function has normalized after her renal transplant.  She denies chest pain or shortness of breath.  She has seen Dr. Rennis Golden for hyperlipidemia.   Current Meds  Medication Sig   amLODipine (NORVASC) 5 MG tablet Take 1 tablet by mouth daily.   aspirin EC 81 MG tablet Take 81 mg by mouth daily.   atorvastatin (LIPITOR) 20 MG tablet Take 1 tablet (20mg ) by mouth alternating with 2 tablets (40mg ) daily   b complex-vitamin c-folic acid (NEPHRO-VITE) 0.8 MG TABS tablet Take 1 tablet by mouth daily.   chlorthalidone (HYGROTON) 25 MG tablet Take 12.5 mg by mouth daily.   ciclopirox (PENLAC) 8 % solution Apply topically at bedtime. Apply over nail and surrounding skin. Apply daily over previous coat. After seven (7) days, may remove with alcohol  and continue cycle.   Continuous Blood Gluc Receiver (DEXCOM G6 RECEIVER) DEVI Use as directed for continuous glucose monitoring.   Continuous Blood Gluc Sensor (DEXCOM G6 SENSOR) MISC Inject 1 sensor to the skin every 10 days for continuous glucose monitoring.   Continuous Blood Gluc Transmit (DEXCOM G6 TRANSMITTER) MISC Use as directed for continuous glucose monitoring. Reuse transmitter for 90 days then discard and replace.   COVID-19 mRNA vaccine 2023-2024 (COMIRNATY) SUSP injection Inject into the muscle.   diphenhydrAMINE (BENADRYL) 25 MG tablet Take 1 tablet (25 mg total) by mouth at bedtime as needed.   docusate sodium (COLACE) 100 MG capsule Take 100 mg by mouth 2 (two) times daily.   ENVARSUS XR 4 MG TB24 Take 2 tablets by mouth daily.   famotidine (PEPCID) 10 MG tablet Take 10 mg by mouth daily as needed.   fluticasone (FLONASE) 50 MCG/ACT nasal spray Place 2 sprays into both nostrils daily as needed.   HUMALOG 100 UNIT/ML injection 100 Units.   hydrOXYzine (ATARAX) 10 MG tablet Take 10 mg by mouth 3 (three) times daily as needed.   insulin glargine (LANTUS) 100 UNIT/ML injection Inject 20 Units into the skin daily. As Needed   K Phos Mono-Sod Phos Di & Mono (K-PHOS-NEUTRAL) [phone] MG TABS Take 1 tablet by mouth daily.   loperamide (IMODIUM) 2 MG capsule Take 1 capsule (2 mg total) by mouth 2 (two) times daily as needed for diarrhea or loose stools.   loratadine (  CLARITIN) 10 MG tablet Take 10 mg by mouth as directed.   magnesium oxide (MAG-OX) 400 MG tablet Take 2 tablets by mouth 2 (two) times daily.   methocarbamol (ROBAXIN) 500 MG tablet Take 500 mg by mouth every 8 (eight) hours as needed.   metoprolol succinate (TOPROL-XL) 50 MG 24 hr tablet Take 1 tablet (50mg ) twice daily.   mycophenolate (MYFORTIC) 180 MG EC tablet Take 180 mg by mouth in the morning, at noon, and at bedtime.   omeprazole (PRILOSEC) 20 MG capsule Take 20 mg by mouth 2 (two) times daily.   ondansetron  (ZOFRAN-ODT) 8 MG disintegrating tablet Take 1 tablet (8 mg total) by mouth every 8 (eight) hours as needed for nausea or vomiting.   predniSONE (DELTASONE) 5 MG tablet Take 5 mg by mouth daily.   promethazine-dextromethorphan (PROMETHAZINE-DM) 6.25-15 MG/5ML syrup Take 2.5 mLs by mouth 3 (three) times daily as needed for cough.   simethicone (MYLICON) 80 MG chewable tablet Chew 80 mg by mouth every 6 (six) hours as needed for flatulence.   sulfamethoxazole-trimethoprim (BACTRIM) 400-80 MG tablet Take 1 tablet by mouth 3 (three) times a week.   tiZANidine (ZANAFLEX) 2 MG tablet Take by mouth.   TYLENOL 500 MG tablet Take 1,000 mg by mouth 3 (three) times daily as needed.     Allergies  Allergen Reactions   Peanut Oil Anaphylaxis, Itching and Swelling    Tree nuts   Shellfish-Derived Products     Lips and thraot swell   Iodine Itching and Swelling    Shellfish-lips swell itching.   Peanut-Containing Drug Products Itching and Swelling   Shellfish Allergy Itching and Swelling    Social History   Socioeconomic History   Marital status: Single    Spouse name: Not on file   Number of children: Not on file   Years of education: Not on file   Highest education level: Not on file  Occupational History   Not on file  Tobacco Use   Smoking status: Never   Smokeless tobacco: Never  Vaping Use   Vaping status: Never Used  Substance and Sexual Activity   Alcohol use: No   Drug use: No   Sexual activity: Not Currently    Birth control/protection: Surgical  Other Topics Concern   Not on file  Social History Narrative   Not on file   Social Determinants of Health   Financial Resource Strain: Not on file  Food Insecurity: Low Risk  (04/26/2023)   Received from [facility]   Hunger Vital Sign    Worried About Running Out of Food in the Last Year: Never true    Ran Out of Food in the Last Year: Never true  Transportation Needs: No Transportation Needs (04/26/2023)   Received from  [facility]    In the past 12 months, has lack of reliable transportation kept you from medical appointments, meetings, work or from getting things needed for daily living? : No  Physical Activity: Not on file  Stress: Not on file  Social Connections: Not on file  Intimate Partner Violence: Not on file     Review of Systems: General: negative for chills, fever, night sweats or weight changes.  Cardiovascular: negative for chest pain, dyspnea on exertion, edema, orthopnea, palpitations, paroxysmal nocturnal dyspnea or shortness of breath Dermatological: negative for rash Respiratory: negative for cough or wheezing Urologic: negative for hematuria Abdominal: negative for nausea, vomiting, diarrhea, bright red blood per rectum, melena, or hematemesis  Neurologic: negative for visual changes, syncope, or dizziness All other systems reviewed and are otherwise negative except as noted above.    Blood pressure 120/66, pulse 78, height 5\' 3"  (1.6 m), weight 178 lb (80.7 kg), last menstrual period 06/26/2011, SpO2 99%.  General appearance: alert and no distress Neck: no adenopathy, no carotid bruit, no JVD, supple, symmetrical, trachea midline, and thyroid not enlarged, symmetric, no tenderness/mass/nodules Lungs: clear to auscultation bilaterally Heart: regular rate and rhythm, S1, S2 normal, no murmur, click, rub or gallop Extremities: extremities normal, atraumatic, no cyanosis or edema Pulses: 2+ and symmetric Skin: Skin color, texture, turgor normal. No rashes or lesions Neurologic: Grossly normal  EKG EKG Interpretation Date/Time:  Tuesday May 07 2023 14:08:51 EDT Ventricular Rate:  78 PR Interval:  130 QRS Duration:  88 QT Interval:  368 QTC Calculation: 419 R Axis:   -10  Text Interpretation: Normal sinus rhythm Possible Anterior infarct , age undetermined When compared with ECG of 25-Dec-2014 09:37, No significant change was found Confirmed by Nanetta Batty [phone]) on 05/07/2023 2:20:09 PM    ASSESSMENT AND PLAN:   Hypertension History of essential hypertension blood pressure measured today at 120/66.  She is on amlodipine, chlorthalidone and metoprolol.  Hyperlipidemia History of hyperlipidemia followed by Dr. Rennis Golden with lipid profile performed 04/11/2023 revealing total cholesterol 183, LDL of 105 HDL of 63 on atorvastatin.  Palpitations History of palpitations with an event monitor that showed PVCs controlled on beta-blockade.     Runell Gess MD FACP,FACC,FAHA, [facility] 05/07/2023 2:32 PM

## 2023-05-07 NOTE — Assessment & Plan Note (Signed)
History of essential hypertension blood pressure measured today at 120/66.  She is on amlodipine, chlorthalidone and metoprolol.

## 2023-05-07 NOTE — Patient Instructions (Signed)
Medication Instructions:  Your physician recommends that you continue on your current medications as directed. Please refer to the Current Medication list given to you today.  *If you need a refill on your cardiac medications before your next appointment, please call your pharmacy*  Follow-Up: At [facility], you and your health needs are our priority.  As part of our continuing mission to provide you with exceptional heart care, we have created designated Provider Care Teams.  These Care Teams include your primary Cardiologist (physician) and Advanced Practice Providers (APPs -  Physician Assistants and Nurse Practitioners) who all work together to provide you with the care you need, when you need it.  Your next appointment:   12 month(s)  Provider:   Nanetta Batty, MD

## 2023-05-07 NOTE — Assessment & Plan Note (Signed)
History of hyperlipidemia followed by Dr. Rennis Golden with lipid profile performed 04/11/2023 revealing total cholesterol 183, LDL of 105 HDL of 63 on atorvastatin.

## 2023-05-07 NOTE — Assessment & Plan Note (Signed)
History of palpitations with an event monitor that showed PVCs controlled on beta-blockade.

## 2023-08-09 LAB — LIPID PANEL
Chol/HDL Ratio: 3.4 {ratio} (ref 0.0–4.4)
Cholesterol, Total: 187 mg/dL (ref 100–199)
HDL: 55 mg/dL (ref >39)
LDL Chol Calc (NIH): 113 mg/dL — ABNORMAL HIGH (ref 0–99)
Triglycerides: 108 mg/dL (ref 0–149)
VLDL Cholesterol Cal: 19 mg/dL (ref 5–40)

## 2023-08-13 ENCOUNTER — Encounter: Payer: Self-pay | Admitting: Internal Medicine

## 2023-08-13 ENCOUNTER — Other Ambulatory Visit (HOSPITAL_COMMUNITY): Payer: Self-pay

## 2023-08-13 ENCOUNTER — Encounter: Payer: Self-pay | Admitting: *Deleted

## 2023-08-13 ENCOUNTER — Telehealth: Payer: Self-pay | Admitting: Pharmacy Technician

## 2023-08-13 ENCOUNTER — Ambulatory Visit: Payer: Medicaid Other | Attending: Internal Medicine | Admitting: Internal Medicine

## 2023-08-13 VITALS — Ht 63.0 in | Wt 176.0 lb

## 2023-08-13 DIAGNOSIS — E119 Type 2 diabetes mellitus without complications: Secondary | ICD-10-CM | POA: Insufficient documentation

## 2023-08-13 DIAGNOSIS — E782 Mixed hyperlipidemia: Secondary | ICD-10-CM | POA: Insufficient documentation

## 2023-08-13 DIAGNOSIS — E785 Hyperlipidemia, unspecified: Secondary | ICD-10-CM | POA: Diagnosis present

## 2023-08-13 MED ORDER — ATORVASTATIN CALCIUM 20 MG PO TABS: 20.0000 mg | ORAL_TABLET | Freq: Every day | ORAL | 3 refills | Status: AC

## 2023-08-13 NOTE — Progress Notes (Signed)
Virtual Visit via Video Note   Because of Maureen Barnes's co-morbid illnesses, she is at least at moderate risk for complications without adequate follow up.  This format is felt to be most appropriate for this patient at this time.  All issues noted in this document were discussed and addressed.  A limited physical exam was performed with this format.  Please refer to the patient's chart for her consent to telehealth for [facility].      Date:  08/13/2023   ID:  Maureen Barnes, DOB 1961-06-28, MRN [id number] The patient was identified using 2 identifiers.  Evaluation Performed:  Barnes Visit  Patient Location:  [address] [place] [address]  Provider location:   [address] [place], [place] [address]  PCP:  Iona Hansen, NP  Cardiologist:  Nanetta Batty, MD Electrophysiologist:  None   Chief Complaint:  Barnes dyslipidemia  History of Present Illness:    Maureen Barnes who presents via audio/video conferencing for a telehealth visit today.  this is a pleasant 63 year old Barnes kindly referred by Dr. Allyson Sabal for evaluation management of dyslipidemia.  She has a history of type 2 diabetes, chronic hepatitis C, hypertension and end-stage renal disease likely secondary to these risk factors and is status post renal transplant in 2020.  She is immunosuppressed.  I think she is known to have any coronary disease however has had elevated cholesterol.  Particularly her triglycerides have been high in the past.  This could be driven by immunosuppressive medications as well.  Unfortunately she cannot come off these medicines.  Her LDL when she was seen by Dr. Allyson Sabal was 116 however recently repeated labs showed total cholesterol 167, triglycerides 137, HDL 49 and LDL of 96.  She is on 20 mg of atorvastatin but she reported after increasing atorvastatin from 20 to 40 mg at night that she had side effects.  Ideally,  would like to target her LDL less than 70 given diabetes with complications.   11/09/2021   Maureen Barnes of her dyslipidemia.  When I last saw her I recommended starting ezetimibe 10 mg daily in addition to low-dose atorvastatin.  This is because she reported she cannot take increased doses of statin.  Unfortunately, she never started the ezetimibe due to concerns about GI side effects and she has underlying gastroparesis.  Cholesterol has increased since her last visit now total 186, triglycerides 84, HDL 62 and LDL 109 (up from 96).   07/10/2022   Maureen Barnes.  She has had further improvement in her lipids but did not tolerate ezetimibe.  She decided to increase her atorvastatin from 10 mg slowly up to 30 mg daily.  Total cholesterol now 159 (down from 186), LDL is now at 86 (down from 109).  Her target LDL is less than 70.  She seems to be tolerating the 30 mg atorvastatin dose.  04/16/2023  Maureen Barnes. She has reported some myalgias on the 40 mg daily dose. She has reduced the dose to 20 mg daily and the cholesterol. With this, her cholesterol has gone up to TC 183, TG 80, HDL 63, and LDL 105. Sometimes she takes 30 mg.  08/13/2023  Maureen Barnes.  She had been trying alternating doses of atorvastatin 20/40 mg every other day.  She reports she could not tolerate the 40  mg dosing.  She had previously tried ezetimibe but because of history of gastroparesis, had difficulty tolerating the GI side effects.  Her cholesterol is now higher with total 187, triglycerides 108, HDL 55 and LDL 113.  Her target LDL is less than 70 given type 2 diabetes.  Prior CV studies:   The following studies were reviewed today:  Chart reviewed  PMHx:  Past Medical History:  Diagnosis Date   Anemia    Cervical disc disease    Chronic kidney disease (CKD), stage V (HCC)    Diabetes mellitus    Hep C  w/o coma, chronic (HCC)    Hypertension    Lumbar disc disease    Peritoneal dialysis status (HCC)    Renal disorder     Past Surgical History:  Procedure Laterality Date   TUBAL LIGATION      FAMHx:  Family History  Problem Relation Age of Onset   Hypertension Mother    Cancer Mother    Breast cancer Mother    Diabetes Father    Hypertension Father     SOCHx:   reports that she has never smoked. She has never used smokeless tobacco. She reports that she does not drink alcohol and does not use drugs.  ALLERGIES:  Allergies  Allergen Reactions   Peanut Oil Anaphylaxis, Itching and Swelling    Tree nuts   Shellfish-Derived Products     Lips and thraot swell   Iodine Itching and Swelling    Shellfish-lips swell itching.   Peanut-Containing Drug Products Itching and Swelling   Shellfish Allergy Itching and Swelling    MEDS:  Current Meds  Medication Sig   amLODipine (NORVASC) 5 MG tablet Take 1 tablet by mouth daily.   aspirin EC 81 MG tablet Take 81 mg by mouth daily.   b complex-vitamin c-folic acid (NEPHRO-VITE) 0.8 MG TABS tablet Take 1 tablet by mouth daily.   chlorthalidone (HYGROTON) 25 MG tablet Take 12.5 mg by mouth daily.   ciclopirox (PENLAC) 8 % solution Apply topically at bedtime. Apply over nail and surrounding skin. Apply daily over previous coat. After seven (7) days, may remove with alcohol and continue cycle.   Continuous Blood Gluc Receiver (DEXCOM G6 RECEIVER) DEVI Use as directed for continuous glucose monitoring.   Continuous Blood Gluc Sensor (DEXCOM G6 SENSOR) MISC Inject 1 sensor to the skin every 10 days for continuous glucose monitoring.   Continuous Blood Gluc Transmit (DEXCOM G6 TRANSMITTER) MISC Use as directed for continuous glucose monitoring. Reuse transmitter for 90 days then discard and replace.   diphenhydrAMINE (BENADRYL) 25 MG tablet Take 1 tablet (25 mg total) by mouth at bedtime as needed.   docusate sodium (COLACE) 100 MG capsule  Take 100 mg by mouth 2 (two) times daily.   ENVARSUS XR 4 MG TB24 Take 2 tablets by mouth daily.   famotidine (PEPCID) 10 MG tablet Take 10 mg by mouth daily as needed.   fluticasone (FLONASE) 50 MCG/ACT nasal spray Place 2 sprays into both nostrils daily as needed.   HUMALOG 100 UNIT/ML injection 100 Units.   hydrOXYzine (ATARAX) 10 MG tablet Take 10 mg by mouth 3 (three) times daily as needed.   insulin glargine (LANTUS) 100 UNIT/ML injection Inject 20 Units into the skin daily. As Needed   K Phos Mono-Sod Phos Di & Mono (K-PHOS-NEUTRAL) [phone] MG TABS Take 1 tablet by mouth daily.   loperamide (IMODIUM) 2 MG capsule Take 1 capsule (2 mg total) by mouth 2 (  two) times daily as needed for diarrhea or loose stools.   loratadine (CLARITIN) 10 MG tablet Take 10 mg by mouth as directed.   magnesium oxide (MAG-OX) 400 MG tablet Take 2 tablets by mouth 2 (two) times daily.   methocarbamol (ROBAXIN) 500 MG tablet Take 500 mg by mouth every 8 (eight) hours as needed.   metoprolol succinate (TOPROL-XL) 50 MG 24 hr tablet Take 1 tablet (50mg ) twice daily.   mycophenolate (MYFORTIC) 180 MG EC tablet Take 180 mg by mouth in the morning, at noon, and at bedtime.   omeprazole (PRILOSEC) 20 MG capsule Take 20 mg by mouth 2 (two) times daily.   ondansetron (ZOFRAN-ODT) 8 MG disintegrating tablet Take 1 tablet (8 mg total) by mouth every 8 (eight) hours as needed for nausea or vomiting.   predniSONE (DELTASONE) 5 MG tablet Take 5 mg by mouth daily.   promethazine-dextromethorphan (PROMETHAZINE-DM) 6.25-15 MG/5ML syrup Take 2.5 mLs by mouth 3 (three) times daily as needed for cough.   simethicone (MYLICON) 80 MG chewable tablet Chew 80 mg by mouth every 6 (six) hours as needed for flatulence.   sulfamethoxazole-trimethoprim (BACTRIM) 400-80 MG tablet Take 1 tablet by mouth 3 (three) times a week.   tiZANidine (ZANAFLEX) 2 MG tablet Take by mouth.   TYLENOL 500 MG tablet Take 1,000 mg by mouth 3 (three) times  daily as needed.   [DISCONTINUED] atorvastatin (LIPITOR) 20 MG tablet Take 1 tablet (20mg ) by mouth alternating with 2 tablets (40mg ) daily     ROS: Pertinent items noted in HPI and remainder of comprehensive ROS otherwise negative.  Labs/Other Tests and Data Reviewed:    Recent Labs: No results found for requested labs within last 365 days.   Recent Lipid Panel Lab Results  Component Value Date/Time   CHOL 187 08/08/2023 10:31 AM   TRIG 108 08/08/2023 10:31 AM   HDL 55 08/08/2023 10:31 AM   CHOLHDL 3.4 08/08/2023 10:31 AM   LDLCALC 113 (H) 08/08/2023 10:31 AM    Wt Readings from Last 3 Encounters:  08/13/23 176 lb (79.8 kg)  05/07/23 178 lb (80.7 kg)  04/16/23 178 lb (80.7 kg)     Exam:    Vital Signs:  Ht 5\' 3"  (1.6 m)   Wt 176 lb (79.8 kg)   LMP 06/26/2011   BMI 31.18 kg/m    General appearance: alert and no distress Lungs: no respiratory difficulty Abdomen: mildly obese Extremities: extremities normal, atraumatic, no cyanosis or edema Neurologic: Grossly normal  ASSESSMENT & PLAN:    Mixed dyslipidemia, goal 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  I advised that she remain on the 20 mg daily but will need additional therapy.  She cannot tolerate Zetia having tried that before and had worsening side effects including GI issues as she does have underlying gastroparesis.  There are few additional options for her but the best option may be a PCSK9 inhibitor.  Based on the trial data from 2015, she is a good candidate as a diabetic with a target LDL less than 70 on maximally tolerated statin therapy.  Will reach out for prior authorization for Repatha.  Plan Barnes with repeat lipids in 3 to 4  months.  Patient Risk:   After full review  of this patients clinical status, I feel that they are at least moderate risk at this time.  Time:   Today, I have spent 25 minutes with the patient with telehealth technology discussing dyslipidemia.     Medication Adjustments/Labs and Tests Ordered: Current medicines are reviewed at length with the patient today.  Concerns regarding medicines are outlined above.   Tests Ordered: Orders Placed This Encounter  Procedures   NMR, lipoprofile    Medication Changes: Meds ordered this encounter  Medications   atorvastatin (LIPITOR) 20 MG tablet    Sig: Take 1 tablet (20 mg total) by mouth daily.    Dispense:  90 tablet    Refill:  3    Disposition:  in 4 month(s)  Chrystie Nose, MD, [facility], FACP  [place]  [facility]  Medical Director of the Advanced Lipid Disorders &  Cardiovascular Risk Reduction Clinic Diplomate of the American Board of Clinical Lipidology Attending Cardiologist  Direct Dial: [phone]  Fax: [phone]  Website:  [email or web address]  Chrystie Nose, MD  08/13/2023 10:08 AM

## 2023-08-13 NOTE — Patient Instructions (Signed)
Medication Instructions:   CHANGE ATORVASTATIN TO 20 MG ONCE DAILY  *If you need a refill on your cardiac medications before your next appointment, please call your pharmacy*   Lab Work:  Your physician recommends that you return for lab work in: 3-4 MO-FASTING  If you have labs (blood work) drawn today and your tests are completely normal, you will receive your results only by: MyChart Message (if you have MyChart) OR A paper copy in the mail If you have any lab test that is abnormal or we need to change your treatment, we will call you to review the results.   Follow-Up: At [facility], you and your health needs are our priority.  As part of our continuing mission to provide you with exceptional heart care, we have created designated Provider Care Teams.  These Care Teams include your primary Cardiologist (physician) and Advanced Practice Providers (APPs -  Physician Assistants and Nurse Practitioners) who all work together to provide you with the care you need, when you need it.  We recommend signing up for the patient portal called "MyChart".  Sign up information is provided on this After Visit Summary.  MyChart is used to connect with patients for Virtual Visits (Telemedicine).  Patients are able to view lab/test results, encounter notes, upcoming appointments, etc.  Non-urgent messages can be sent to your provider as well.   To learn more about what you can do with MyChart, go to [email or web address].    Your next appointment:   4 month(s)  Provider:   Zoila Shutter

## 2023-08-13 NOTE — Telephone Encounter (Signed)
-----   Message from Nurse Victorio Palm sent at 08/13/2023  9:40 AM EST ----- Needs prior auth for repatha-medicaid-thanks

## 2023-08-13 NOTE — Telephone Encounter (Signed)
Pharmacy Patient Advocate Encounter   Received notification from Physician's Office that prior authorization for repatha is required/requested.   Insurance verification completed.   The patient is insured through  [place]  .   Per test claim: PA required; PA submitted to above mentioned insurance via Fax Key/confirmation #/EOC Faxed   Status is pending

## 2023-08-20 ENCOUNTER — Other Ambulatory Visit (HOSPITAL_COMMUNITY): Payer: Self-pay

## 2023-08-20 NOTE — Telephone Encounter (Signed)
Left message for patient of approval

## 2023-08-20 NOTE — Telephone Encounter (Signed)
Per test claim: PA approved 2ml for 28 days is $4.00. will update dates once approval letter arrives

## 2023-12-11 ENCOUNTER — Ambulatory Visit: Payer: Medicaid Other | Admitting: Internal Medicine

## 2024-01-17 ENCOUNTER — Other Ambulatory Visit: Payer: Self-pay | Admitting: Nurse Practitioner

## 2024-01-17 DIAGNOSIS — Z1231 Encounter for screening mammogram for malignant neoplasm of breast: Secondary | ICD-10-CM

## 2024-02-11 ENCOUNTER — Ambulatory Visit
Admission: RE | Admit: 2024-02-11 | Discharge: 2024-02-11 | Disposition: A | Discharge status: Home / Self Care | Source: Ambulatory Visit | Attending: Nurse Practitioner | Admitting: Nurse Practitioner

## 2024-02-11 DIAGNOSIS — Z1231 Encounter for screening mammogram for malignant neoplasm of breast: Secondary | ICD-10-CM

## 2024-02-21 ENCOUNTER — Ambulatory Visit: Admitting: Internal Medicine

## 2024-04-17 ENCOUNTER — Other Ambulatory Visit: Payer: Self-pay | Admitting: Cardiovascular Disease

## 2024-05-07 NOTE — Progress Notes (Signed)
 [facility]  - Family Medicine Myra Master  Date of Service: 05/07/2024 Patient Name: Maureen Barnes Patient DOB: [birth year]    Subjective:   Neck Pain .   HPI Patient comes in for Neck Pain  Patient here for left shoulder and neck pain. Patient has had pain for several years and in physical therapy. She was told to come back to orthopedic and have a MRI. Had MRI 2023 showing mod to severe C5-7 stenosis.   Thinks flare up of her neck/ shoulder pain. A year ago, noted stretching and injury of left shoulder.  Saw specialist but didn't return Walking dog and pulling on leash she thinks has flared up her pain. Also sitting in recliner. Discussed pillow/ posture. stretching  Thinks this is more flared now. Lays down and muscle pain left side.  Pain left shoulder to hand. Sometimes feels she can't hold items in left hand as well. Dropping more items.  Gets headaches back of head from neck. Has neurology appt sch for Nov  Review of Systems Pertinent ROS items are noted in HPI.  Constitutional symptoms: negative Eyes:  negative Ear, nose, throat:  has had recent sinus/ pressure, facial discomfort. Used claritin, sinus med which has helped a little. Started back on flonase. No fever or cough, has had congestion at times. Non smoker Cardiovascular:  negative Respiratory:  negative Gastrointestinal:  negative Genitourinary:  negative stable labs with nephrology.  Skin:  left foot under great toe burning like skin injury. No swelling. Noted a couple of days Neurological:  negative Musculoskeletal:  left shoulder and neck pain Psychiatric:  negative Endocrine:  last AIC 7.1, is on prednisone  5 mg daily for her transplant history.  Hematological:  negative Allergic:  negative   The following portions of the patient's history were reviewed and updated as appropriate: allergies, current medications, PMH/PSH, past social history and problem  list.   Past Medical/Surgical History:   Medical History[1] Surgical History[2]  Family History:   Family History[3]  Social History:   Social History[4] Tobacco Use History[5]   Allergies:   Peanut, Peanut oil, Shellfish containing products, and Iodine  Current Medications:   Current Medications[6]   Objective:   Vital Signs BP 133/78 (BP Location: Left arm, Patient Position: Sitting)   Pulse 89   Temp 97.1 F (36.2 C) (Temporal)   Ht 1.6 m (5' 3)   Wt 82.7 kg (182 lb 6 oz)   SpO2 99%   Breastfeeding No   BMI 32.31 kg/m   BP Readings from Last 3 Encounters:  05/07/24 133/78  03/05/24 131/72  02/18/24 139/78   Wt Readings from Last 3 Encounters:  05/07/24 82.7 kg (182 lb 6 oz)  03/05/24 82.1 kg (181 lb)  02/18/24 82.4 kg (181 lb 11.2 oz)   No LMP recorded. Patient is postmenopausal.  Physical Exam  Constitutional.  Well appearing 63 y.o. female, well developed, well nourished, no acute distress. Weight stable, VSS HEENT: throat normal. Bil nares: enlarged bil turbinates boggy appearing. TM's clear bil,  Respiratory.  Clear bilaterally, breathsounds equal, respirations unlabored. Cardiovascular.  Regular, nl S1, S2; no murmurs, gallops or rubs.  No lower extremity edema, 2+ peripheral pulses. Neuro: alert, oriented x 3, CN 2-12 intact bil, no neuro deficits.  Skin: warm and dry, left foot great toe area and foot without open sores. No redness or swelling noted. Reassured. Keep area with lotion Psych: cooperative, pleasant.   Assessment/Plan:    Caro was seen today  for neck pain.  Diagnoses and all orders for this visit:  Chronic neck and back pain -     Discontinue: methocarbamoL (ROBAXIN) 500 mg tablet; Take 1 tablet (500 mg total) by mouth 3 (three) times a day as needed for muscle spasms. -     Ambulatory referral to Physical Therapy; Future -     Ambulatory Referral to Orthopedics; Future -     methocarbamoL (ROBAXIN) 500 mg tablet; Take 1  tablet (500 mg total) by mouth 3 (three) times a day as needed for muscle spasms.  Spinal stenosis, cervical region -     Ambulatory Referral to Orthopedics; Future  Radicular pain in left arm -     Ambulatory Referral to Orthopedics; Future  Seasonal allergic rhinitis, unspecified trigger -     Discontinue: fluticasone propionate (FLONASE) 50 mcg/spray nasal spray; Administer 1 spray into each nostril 2 (two) times a day. -     fluticasone propionate (FLONASE) 50 mcg/spray nasal spray; Administer 1 spray into each nostril 2 (two) times a day.  Refer to ortho spine/ refer to PT.  Discussed exercising, strengthening. Neck pillow. Heat. Etc.  Refilled robaxin, use tylenol  arthritis prn.  Refilled flonase, use mucinex / allergy med prn. If not improving or worsening over next  2 weeks, to follow up  Patient verbalizes understanding and in agreement with the above plan. All questions answered.    Medication side effects discussed with patient. Advised patient to call clinic or return for visit if these symptoms occur.   Goals of care discussed with patient including med compliance and adequate follow up.  Return for as scheduled.    This document serves as a record of services personally performed by Santana Molt, FN.  It was created on their behalf by Seldon GORMAN Ann, CMA, a trained medical scribe, and Certified Medical Assistant (CMA). During the course of documenting the history, physical exam and medical decision making, I was functioning as a Stage manager. The creation of this record is the provider's dictation and/or activities during the visit.  Electronically signed by Seldon GORMAN Ann, CMA 05/07/2024 10:29 AM    This document was created using the aid of voice recognition Dragon dictation software.   Santana Tarry Molt, FNP       [1] Past Medical History: Diagnosis Date  . Anemia   . Anxiety   . Chronic ethmoidal sinusitis   . Chronic hepatitis C    (CMD)   . CKD  (chronic kidney disease), stage V    (CMD)   . DDD (degenerative disc disease), cervical   . DDD (degenerative disc disease), lumbar   . Diabetes mellitus   . Dysmenorrhea   . ESRD on peritoneal dialysis    (CMD)   . Essential hypertension   . H/O cesarean section   . Hepatitis C    treated 2012  . HLD (hyperlipidemia)   . Hypertension   . Non-recurrent unilateral inguinal hernia without obstruction or gangrene 04/02/2018   Added automatically from request for surgery [phone]  . Palpitations    racing  . Pre-transplant evaluation for end stage renal disease 11/18/2017  . Pre-transplant evaluation for end stage renal disease 11/18/2017  . Sciatic nerve pain    RIGHT SIDE OF BACK  . Shortness of breath on exertion   . Type 2 diabetes mellitus with renal complication    (CMD)   . Unilateral inguinal hernia 11/18/2017  [2] Past Surgical History: Procedure Laterality Date  . CESAREAN SECTION, UNSPECIFIED  Procedure: CESAREAN SECTION  . DILATION AND CURETTAGE OF UTERUS     Procedure: DILATION AND CURETTAGE OF UTERUS  . INGUINAL HERNIA REPAIR Right 04/17/2018   Procedure: INGUINAL HERNIA REPAIR ROBOTIC ASSISTED;  Surgeon: Bethann Nettles, MD;  Location: [facility] MAIN OR;  Service: General;  Laterality: Right;  . KIDNEY TRANSPLANT N/A 02/28/2019   Procedure: KIDNEY TRANSPLANT CADAVERIC, REPERFUSION BIOPSY AND  REMOVAL OF [id number] CATHETHER.;  Surgeon: Reyes Gosling, MD;  Location: [facility] MAIN OR;  Service: Transplant;  Laterality: N/A;  UNOS #: [id number]  kidney in room @ 1654  anastomosis start @ 1920  anastomosis end @ 1942  . LAPAROSCOPIC INSERTION PERITONEAL CATHETER N/A 04/26/2017   Procedure: PERITONEAL DIALYSIS CATHETER INSERT LAPAROSCOPIC, OMENTOPEXY,;  Surgeon: Bethann Nettles, MD;  Location: [facility] MAIN OR;  Service: General;  Laterality: N/A;  . LAPAROSCOPIC INSERTION PERITONEAL CATHETER N/A 07/04/2017   Procedure: PERITONEAL DIALYSIS CATHETER INSERT LAPAROSCOPIC;  Surgeon: Bethann Nettles,  MD;  Location: [facility] MAIN OR;  Service: General;  Laterality: N/A;  . ROTATOR CUFF REPAIR Right    Procedure: ROTATOR CUFF REPAIR  . TUBAL LIGATION     Procedure: TUBAL LIGATION  [3] Family History Problem Relation Name Age of Onset  . Hypertension Mother    . Cancer Mother         breast and bone metastases  . Cirrhosis Mother    . Breast cancer Mother    . Diabetes Father    . Hypertension Father    . Diabetes Sister    . Hypertension Brother    . Hypertension Brother    . Hypertension Brother    . Hypertension Brother    [4] Social History Socioeconomic History  . Marital status: Single  Tobacco Use  . Smoking status: Never    Passive exposure: Never  . Smokeless tobacco: Never  Substance and Sexual Activity  . Alcohol use: No  . Drug use: No   Social Drivers of Health   Food Insecurity: Medium Risk (03/05/2024)   Food vital sign   . Within the past 12 months, you worried that your food would run out before you got money to buy more: Sometimes true   . Within the past 12 months, the food you bought just didn't last and you didn't have money to get more: Sometimes true  Transportation Needs: No Transportation Needs (03/05/2024)   Transportation   . In the past 12 months, has lack of reliable transportation kept you from medical appointments, meetings, work or from getting things needed for daily living? : No  Safety: Low Risk  (03/05/2024)   Safety   . How often does anyone, including family and friends, physically hurt you?: Never   . How often does anyone, including family and friends, insult or talk down to you?: Never   . How often does anyone, including family and friends, threaten you with harm?: Never   . How often does anyone, including family and friends, scream or curse at you?: Never  Living Situation: Low Risk  (03/05/2024)   Living Situation   . What is your living situation today?: I have a steady place to live   . Think about the place you live. Do you  have problems with any of the following? Choose all that apply:: None/None on this list  [5] Social History Tobacco Use  Smoking Status Never  . Passive exposure: Never  Smokeless Tobacco Never  [6] Current Outpatient Medications  Medication Sig Dispense Refill  . acetaminophen  (TYLENOL ) 500  mg tablet Take 1,000 mg by mouth every 6 (six) hours as needed.    . amLODIPine  (NORVASC ) 5 mg tablet Take 1 tablet (5 mg total) by mouth daily. 90 tablet 0  . aspirin 81 mg EC tablet Take 81 mg by mouth Once Daily.    . atorvastatin  (LIPITOR) 20 mg tablet Take 1 tablet (20 mg total) by mouth nightly. 90 tablet 1  . bifidobacterium infantis (Align) 4 mg capsule Take 1 capsule (4 mg total) by mouth daily.    . blood-glucose meter,continuous (Dexcom G6 Receiver) misc Use as directed for continuous glucose monitoring. 1 each 0  . blood-glucose sensor Inject 1 sensor to the skin every 10 days for continuous glucose monitoring. 9 each 3  . blood-glucose transmitter device (Dexcom G6 Transmitter) Use as directed for continuous glucose monitoring. Reuse transmitter for 90 days then discard and replace. 1 each 3  . blood-glucose transmitter device Use as directed for continuous glucose monitoring. Reuse transmitter for 90 days then discard and replace. 1 each 3  . chlorthalidone (HYGROTON) 25 mg tablet Take 0.5 tablets (12.5 mg total) by mouth daily. 45 tablet 5  . ciclopirox  (PENLAC ) 8 % solution APPLY TOPICALLY AT BEDTIME. APPLY OVER NAIL AND SURROUNDING SKIN, APPLY DAILY OVER PREVIOUS COAT. AFTER 7 DAYS, MAY REMOVE WITH ALCOHOL AND CONTINUE CYCLE. 6.6 mL 1  . diclofenac sodium (VOLTAREN) 1 % gel Apply 1 g topically 4 (four) times a day as needed (on neck as needed for mild/moderate pain.). 20 g 0  . dicyclomine (BENTYL) 10 mg capsule Take 1 capsule (10 mg total) by mouth 3 (three) times a day as needed (spasms). 90 capsule 3  . hydroquinone 4 % cream Apply once daily for 3 months 28.35 g 5  . hydrOXYzine  (ATARAX) 10 mg tablet Take 1 tablet (10 mg total) by mouth 3 (three) times a day as needed for anxiety Indications: anxious. 90 tablet 1  . insulin glargine (Lantus Solostar U-100 Insulin) 100 unit/mL (3 mL) pen Inject 20 Units under the skin in the morning. If insulin pump malfunction. 15 mL 3  . insulin lispro (HumaLOG KwikPen) 100 unit/mL KwikPen Use as directed if off insulin pump. MAX TDD 50 15 mL 5  . insulin lispro (HumaLOG) 100 unit/mL injection Use as directed via insulin pump. MAX TDD 80 units 60 mL 6  . insulin pump cart,cont inf,BT crtg REPLACE POD EVERY 72 HOURS AS DIRECTED 30 each 4  . insulin pump cartridge automated dosing (Omnipod 5 G6-G7 Pods, Gen 5,) crtg subcutaneous cartridge Change every 2 days 15 each 6  . ipratropium (ATROVENT) 21 mcg (0.03 %) nasal spray Administer 2 sprays into each nostril 2 (two) times a day. 30 mL 3  . lancing device with lancets kit 1 Device by miscellaneous route 4 (four) times a day. 200 each 5  . magnesium oxide 400 mg (241 mg magnesium) tab Take 2 tablets (800 mg total) by mouth 2 (two) times a day. 360 tablet 3  . metoprolol  tartrate (LOPRESSOR ) 100 mg tablet Take 50 mg by mouth 2 (two) times a day.    . mycophenolate (MYFORTIC) 180 mg TbEC DR tablet TAKE 3 TABLETS BY MOUTH 2 TIMES DAILY. 540 tablet 1  . omeprazole (PriLOSEC) 20 mg DR capsule TAKE 2 CAPSULES BY MOUTH DAILY. 60 capsule 2  . ondansetron  (ZOFRAN ) 4 mg tablet Take 1 tablet (4 mg total) by mouth every 12 (twelve) hours as needed for nausea or vomiting. 60 tablet 1  .  pen needle, diabetic 32 gauge x 5/32 ndle Use to administer 4 times daily 200 each 11  . predniSONE  (DELTASONE ) 5 mg tablet Take 5 mg by mouth Once Daily. 90 tablet 1  . SUMAtriptan (IMITREX) 50 mg tablet Take 1 tablet (50 mg total) by mouth once as needed for migraine. May repeat dose once in 2 hours if no relief.  Do not exceed 2 doses in 24 hours. 10 tablet 0  . tacrolimus  (Envarsus  XR) 1 mg Tb24 extended release tablet  Take 2 tablets (2 mg total) by mouth daily. 180 tablet 7  . tacrolimus  (Envarsus  XR) 4 mg Tb24 extended release tablet Take 1 tablet (4 mg total) by mouth daily. 120 tablet 5  . tretinoin (Retin-A) 0.025 % cream Apply topically at bedtime. 45 g 3  . fluticasone propionate (FLONASE) 50 mcg/spray nasal spray Administer 1 spray into each nostril 2 (two) times a day. 16 g 3  . methocarbamoL (ROBAXIN) 500 mg tablet Take 1 tablet (500 mg total) by mouth 3 (three) times a day as needed for muscle spasms. 60 tablet 0   No current facility-administered medications for this visit.

## 2024-05-08 LAB — NMR, LIPOPROFILE
Cholesterol, Total: 172 mg/dL (ref 100–199)
HDL Particle Number: 40.8 umol/L (ref >=30.5)
HDL-C: 62 mg/dL (ref >39)
LDL Particle Number: 1214 nmol/L — ABNORMAL HIGH (ref <1000)
LDL Size: 21 nm (ref >20.5)
LDL-C (NIH Calc): 99 mg/dL (ref 0–99)
LP-IR Score: 29 (ref <=45)
Small LDL Particle Number: 540 nmol/L — ABNORMAL HIGH (ref <=527)
Triglycerides: 55 mg/dL (ref 0–149)

## 2024-05-12 ENCOUNTER — Encounter: Payer: Self-pay | Admitting: Internal Medicine

## 2024-05-12 ENCOUNTER — Ambulatory Visit: Attending: Internal Medicine | Admitting: Internal Medicine

## 2024-05-12 VITALS — BP 130/80 | HR 79 | Ht 63.0 in | Wt 182.5 lb

## 2024-05-12 DIAGNOSIS — R002 Palpitations: Secondary | ICD-10-CM | POA: Diagnosis present

## 2024-05-12 DIAGNOSIS — E785 Hyperlipidemia, unspecified: Secondary | ICD-10-CM | POA: Diagnosis not present

## 2024-05-12 DIAGNOSIS — E782 Mixed hyperlipidemia: Secondary | ICD-10-CM | POA: Diagnosis not present

## 2024-05-12 DIAGNOSIS — I10 Essential (primary) hypertension: Secondary | ICD-10-CM | POA: Insufficient documentation

## 2024-05-12 MED ORDER — EZETIMIBE 10 MG PO TABS: 10.0000 mg | ORAL_TABLET | ORAL | 3 refills | Status: AC

## 2024-05-12 NOTE — Progress Notes (Signed)
 LIPID CLINIC CONSULT NOTE  Chief Complaint:  Follow-up dyslipidemia  Primary Care Physician: Joshua Santana CROME, NP  Primary Cardiologist:  Dorn Lesches, MD  HPI:  Maureen Barnes is a 63 y.o. female who is being seen today for the evaluation of dyslipidemia at the request of Joshua Santana CROME, NP. this is a pleasant 63 year old female kindly referred by Dr. Lesches for evaluation management of dyslipidemia.  She has a history of type 2 diabetes, chronic hepatitis C, hypertension and end-stage renal disease likely secondary to these risk factors and is status post renal transplant in 2020.  She is immunosuppressed.  I think she is known to have any coronary disease however has had elevated cholesterol.  Particularly her triglycerides have been high in the past.  This could be driven by immunosuppressive medications as well.  Unfortunately she cannot come off these medicines.  Her LDL when she was seen by Dr. Lesches was 116 however recently repeated labs showed total cholesterol 167, triglycerides 137, HDL 49 and LDL of 96.  She is on 20 mg of atorvastatin  but she reported after increasing atorvastatin  from 20 to 40 mg at night that she had side effects.  Ideally, would like to target her LDL less than 70 given diabetes with complications.  11/09/2021  Ms. Pulcini returns today for follow-up of her dyslipidemia.  When I last saw her I recommended starting ezetimibe  10 mg daily in addition to low-dose atorvastatin .  This is because she reported she cannot take increased doses of statin.  Unfortunately, she never started the ezetimibe  due to concerns about GI side effects and she has underlying gastroparesis.  Cholesterol has increased since her last visit now total 186, triglycerides 84, HDL 62 and LDL 109 (up from 96).  07/10/2022  Ms. Dohrmann returns today for follow-up.  She has had further improvement in her lipids but did not tolerate ezetimibe .  She decided to increase her atorvastatin  from  10 mg slowly up to 30 mg daily.  Total cholesterol now 159 (down from 186), LDL is now at 86 (down from 109).  Her target LDL is less than 70.  She seems to be tolerating the 30 mg atorvastatin  dose.  05/12/2024  Ms. Vicci is seen today in follow-up.  I last saw her via virtual visits.  We discussed adding Repatha as her cholesterol was not at goal but she was not interested in injections apparently.  She is try to work on diet and taking more atorvastatin .  Currently she is taking 20 mg alternating with 30 mg (for which she cuts the 20 mg tablet in half).  There is been a slight improvement in her lipids with total cholesterol 172, triglycerides 55, LDL 99 and HDL 62.  LDL particle numbers remain elevated.  She needs to reach a target LDL less than 70.  Previously she has tried ezetimibe  but said that she had concerns about GI side effects but never actually started the medicine.  PMHx:  Past Medical History:  Diagnosis Date   Anemia    Cervical disc disease    Chronic kidney disease (CKD), stage V (HCC)    Diabetes mellitus    Hep C w/o coma, chronic (HCC)    Hypertension    Lumbar disc disease    Peritoneal dialysis status    Renal disorder     Past Surgical History:  Procedure Laterality Date   TUBAL LIGATION      FAMHx:  Family History  Problem Relation Age  of Onset   Hypertension Mother    Cancer Mother    Breast cancer Mother    Diabetes Father    Hypertension Father     SOCHx:   reports that she has never smoked. She has never used smokeless tobacco. She reports that she does not drink alcohol and does not use drugs.  ALLERGIES:  Allergies  Allergen Reactions   Peanut Oil Anaphylaxis, Itching and Swelling    Tree nuts   Shellfish Protein-Containing Drug Products     Lips and thraot swell   Iodine Itching and Swelling    Shellfish-lips swell itching.   Peanut-Containing Drug Products Itching and Swelling   Shellfish Allergy Itching and Swelling     ROS: Pertinent items noted in HPI and remainder of comprehensive ROS otherwise negative.  HOME MEDS: Current Outpatient Medications on File Prior to Visit  Medication Sig Dispense Refill   amLODipine  (NORVASC ) 5 MG tablet Take 1 tablet by mouth daily.     aspirin EC 81 MG tablet Take 81 mg by mouth daily.     atorvastatin  (LIPITOR) 20 MG tablet Take 1 tablet (20 mg total) by mouth daily. 90 tablet 3   b complex-vitamin c-folic acid  (NEPHRO-VITE) 0.8 MG TABS tablet Take 1 tablet by mouth daily.  6   chlorthalidone (HYGROTON) 25 MG tablet Take 12.5 mg by mouth daily.     ciclopirox  (PENLAC ) 8 % solution Apply topically at bedtime. Apply over nail and surrounding skin. Apply daily over previous coat. After seven (7) days, may remove with alcohol and continue cycle. 6.6 mL 4   Continuous Blood Gluc Receiver (DEXCOM G6 RECEIVER) DEVI Use as directed for continuous glucose monitoring.     Continuous Blood Gluc Sensor (DEXCOM G6 SENSOR) MISC Inject 1 sensor to the skin every 10 days for continuous glucose monitoring.     Continuous Blood Gluc Transmit (DEXCOM G6 TRANSMITTER) MISC Use as directed for continuous glucose monitoring. Reuse transmitter for 90 days then discard and replace.     diphenhydrAMINE  (BENADRYL ) 25 MG tablet Take 1 tablet (25 mg total) by mouth at bedtime as needed. 30 tablet 0   docusate sodium (COLACE) 100 MG capsule Take 100 mg by mouth 2 (two) times daily. (Patient taking differently: Take 100 mg by mouth 2 (two) times daily as needed.)     ENVARSUS  XR 4 MG TB24 Take 2 tablets by mouth daily.     famotidine (PEPCID) 10 MG tablet Take 10 mg by mouth daily as needed.     fluticasone (FLONASE) 50 MCG/ACT nasal spray Place 2 sprays into both nostrils daily as needed.     HUMALOG 100 UNIT/ML injection 100 Units.     hydrOXYzine (ATARAX) 10 MG tablet Take 10 mg by mouth 3 (three) times daily as needed.     insulin glargine (LANTUS) 100 UNIT/ML injection Inject 20 Units into the  skin daily. As Needed     K Phos Mono-Sod Phos Di & Mono (K-PHOS-NEUTRAL) 155-852-130 MG TABS Take 1 tablet by mouth daily.     loperamide  (IMODIUM ) 2 MG capsule Take 1 capsule (2 mg total) by mouth 2 (two) times daily as needed for diarrhea or loose stools. 14 capsule 0   loratadine (CLARITIN) 10 MG tablet Take 10 mg by mouth as directed.     magnesium oxide (MAG-OX) 400 MG tablet Take 2 tablets by mouth 2 (two) times daily.     methocarbamol (ROBAXIN) 500 MG tablet Take 500 mg by mouth every 8 (eight)  hours as needed.     metoprolol  succinate (TOPROL -XL) 50 MG 24 hr tablet Take 1 tablet by mouth twice daily 180 tablet 0   mycophenolate (MYFORTIC) 180 MG EC tablet Take 180 mg by mouth in the morning, at noon, and at bedtime.     omeprazole (PRILOSEC) 20 MG capsule Take 20 mg by mouth 2 (two) times daily. (Patient taking differently: Take 40 mg by mouth daily.)     ondansetron  (ZOFRAN -ODT) 8 MG disintegrating tablet Take 1 tablet (8 mg total) by mouth every 8 (eight) hours as needed for nausea or vomiting. 20 tablet 0   predniSONE  (DELTASONE ) 5 MG tablet Take 5 mg by mouth daily.     promethazine -dextromethorphan (PROMETHAZINE -DM) 6.25-15 MG/5ML syrup Take 2.5 mLs by mouth 3 (three) times daily as needed for cough. 100 mL 0   simethicone (MYLICON) 80 MG chewable tablet Chew 80 mg by mouth every 6 (six) hours as needed for flatulence.     tiZANidine (ZANAFLEX) 2 MG tablet Take by mouth.     TYLENOL  500 MG tablet Take 1,000 mg by mouth 3 (three) times daily as needed.     COVID-19 mRNA vaccine 2023-2024 (COMIRNATY ) SUSP injection Inject into the muscle. 0.3 mL 0   sulfamethoxazole-trimethoprim (BACTRIM) 400-80 MG tablet Take 1 tablet by mouth 3 (three) times a week. (Patient not taking: Reported on 05/12/2024)     No current facility-administered medications on file prior to visit.    LABS/IMAGING: No results found for this or any previous visit (from the past 48 hours). No results  found.  LIPID PANEL:    Component Value Date/Time   CHOL 187 08/08/2023 1031   TRIG 108 08/08/2023 1031   HDL 55 08/08/2023 1031   CHOLHDL 3.4 08/08/2023 1031   LDLCALC 113 (H) 08/08/2023 1031    WEIGHTS: Wt Readings from Last 3 Encounters:  05/12/24 182 lb 8 oz (82.8 kg)  08/13/23 176 lb (79.8 kg)  05/07/23 178 lb (80.7 kg)    VITALS: BP 130/80   Pulse 79   Ht 5' 3 (1.6 m)   Wt 182 lb 8 oz (82.8 kg)   LMP 06/26/2011   SpO2 98%   BMI 32.33 kg/m   EXAM: Deferred  EKG: EKG Interpretation Date/Time:  Tuesday May 12 2024 09:46:10 EDT Ventricular Rate:  79 PR Interval:  130 QRS Duration:  92 QT Interval:  392 QTC Calculation: 449 R Axis:   1  Text Interpretation: Normal sinus rhythm Cannot rule out Anterior infarct (cited on or before 07-May-2023) When compared with ECG of 07-May-2023 14:08, No significant change was found Confirmed by Mona Kent [phone]) on 05/12/2024 10:02:14 AM    ASSESSMENT: Mixed dyslipidemia, goal LDL less than 70 Type 2 diabetes with complications Hypertension End-stage renal disease status post renal transplant-immunosuppressed History of palpitations  PLAN: 1.   Ms. Vicci still remains above target LDL less than 70 and is uninterested in trying Repatha.  We again discussed ezetimibe  which she had some concerns about in the past but never actually tried it.  I recommend starting 10 mg daily in addition to what ever statin dose she can tolerate.  Plan repeat lipids in about 3 months.  Kent KYM Mona, MD, [facility], FNLA, FACP  [place]  [facility]  Medical Director of the [facility]  Card[facility]ovascular Risk Reduction Clinic Diplomate of the American Board of Clinical Lipidology Attending Cardiologist  Direct Dial: [phone]  Fax: [phone]  Website:  [email or web address]   Kent  C Anita Mcadory 05/12/2024, 10:13 AM

## 2024-05-12 NOTE — Patient Instructions (Signed)
 Medication Instructions:  Zetia  10 mg daily *If you need a refill on your cardiac medications before your next appointment, please call your pharmacy*  Lab Work: NMR- Fasting 3 months If you have labs (blood work) drawn today and your tests are completely normal, you will receive your results only by: MyChart Message (if you have MyChart) OR A paper copy in the mail If you have any lab test that is abnormal or we need to change your treatment, we will call you to review the results.  Testing/Procedures: None ordered  Follow-Up: At [facility], you and your health needs are our priority.  As part of our continuing mission to provide you with exceptional heart care, our providers are all part of one team.  This team includes your primary Cardiologist (physician) and Advanced Practice Providers or APPs (Physician Assistants and Nurse Practitioners) who all work together to provide you with the care you need, when you need it.  Your next appointment:    As needed based on lab work  Provider:   Dr Mona  We recommend signing up for the patient portal called MyChart.  Sign up information is provided on this After Visit Summary.  MyChart is used to connect with patients for Virtual Visits (Telemedicine).  Patients are able to view lab/test results, encounter notes, upcoming appointments, etc.  Non-urgent messages can be sent to your provider as well.   To learn more about what you can do with MyChart, go to [email or web address].

## 2024-06-30 ENCOUNTER — Ambulatory Visit: Attending: Cardiovascular Disease | Admitting: Cardiovascular Disease

## 2024-06-30 ENCOUNTER — Encounter: Payer: Self-pay | Admitting: Cardiovascular Disease

## 2024-06-30 VITALS — BP 140/80 | HR 64 | Ht 63.0 in | Wt 180.0 lb

## 2024-06-30 DIAGNOSIS — I10 Essential (primary) hypertension: Secondary | ICD-10-CM

## 2024-06-30 DIAGNOSIS — R002 Palpitations: Secondary | ICD-10-CM

## 2024-06-30 DIAGNOSIS — E782 Mixed hyperlipidemia: Secondary | ICD-10-CM

## 2024-06-30 NOTE — Assessment & Plan Note (Signed)
 History of palpitations in the past found to be PVCs on event monitoring very sensitive to caffeine intake.  She currently denies palpitations.

## 2024-06-30 NOTE — Progress Notes (Signed)
 06/30/2024 Maureen Barnes   06/05/1961  [id number]  Primary Physician Joshua Santana CROME, NP Primary Cardiologist: Dorn JINNY Lesches MD GENI CODY MADEIRA, FSCAI  HPI:  Maureen Barnes is a 63 y.o.   female moderately overweight single African-American female mother of 3 children referred by Dr. Husain  for cardiovascular evaluation because of symptomatic palpitations.  I last saw her in the office 05/07/2023.  She has a history of true hypertension, hyperlipidemia and diabetes. She has never smoked. She currently does not work.  She's had chronic renal insufficiency for years with anticipation of needing an AV fistula in the near future. She does complain of dyspnea on exertion and some atypical chest pain as well as symptomatic palpitations. She has chronic hepatitis C    She did get a renal transplant at [facility] in 2020.  She saw Scot Ford PA-C in the office for palpitations.  An event monitor showed PVCs.  She did complain of chest pain and shortness of breath and had a normal 2D echo and Myoview  stress test.   Since I saw her a year ago she is remained stable.  Her renal transplant is functioning well and apparently she has normal renal function now on immunosuppressive therapy status post renal transplant.  She denies chest pain or shortness of breath.  She currently denies palpitations as well.  She has seen Dr. Mona in the lipid clinic.   Current Meds  Medication Sig   amLODipine  (NORVASC ) 5 MG tablet Take 1 tablet by mouth daily.   aspirin EC 81 MG tablet Take 81 mg by mouth daily.   atorvastatin  (LIPITOR) 20 MG tablet Take 1 tablet (20 mg total) by mouth daily.   b complex-vitamin c-folic acid  (NEPHRO-VITE) 0.8 MG TABS tablet Take 1 tablet by mouth daily.   ciclopirox  (PENLAC ) 8 % solution Apply topically at bedtime. Apply over nail and surrounding skin. Apply daily over previous coat. After seven (7) days, may remove with alcohol and continue cycle.    Continuous Blood Gluc Receiver (DEXCOM G6 RECEIVER) DEVI Use as directed for continuous glucose monitoring.   Continuous Blood Gluc Sensor (DEXCOM G6 SENSOR) MISC Inject 1 sensor to the skin every 10 days for continuous glucose monitoring.   Continuous Blood Gluc Transmit (DEXCOM G6 TRANSMITTER) MISC Use as directed for continuous glucose monitoring. Reuse transmitter for 90 days then discard and replace.   diphenhydrAMINE  (BENADRYL ) 25 MG tablet Take 1 tablet (25 mg total) by mouth at bedtime as needed.   docusate sodium (COLACE) 100 MG capsule Take 100 mg by mouth 2 (two) times daily. (Patient taking differently: Take 100 mg by mouth 2 (two) times daily as needed.)   ENVARSUS  XR 4 MG TB24 Take 2 tablets by mouth daily.   ezetimibe  (ZETIA ) 10 MG tablet Take 1 tablet (10 mg total) by mouth every morning.   famotidine (PEPCID) 10 MG tablet Take 10 mg by mouth daily as needed.   fluticasone (FLONASE) 50 MCG/ACT nasal spray Place 2 sprays into both nostrils daily as needed.   HUMALOG 100 UNIT/ML injection 100 Units.   hydrOXYzine (ATARAX) 10 MG tablet Take 10 mg by mouth 3 (three) times daily as needed.   insulin glargine (LANTUS) 100 UNIT/ML injection Inject 20 Units into the skin daily. As Needed   K Phos Mono-Sod Phos Di & Mono (K-PHOS-NEUTRAL) 155-852-130 MG TABS Take 1 tablet by mouth daily.   loperamide  (IMODIUM ) 2 MG capsule Take 1 capsule (2  mg total) by mouth 2 (two) times daily as needed for diarrhea or loose stools.   loratadine (CLARITIN) 10 MG tablet Take 10 mg by mouth as directed.   magnesium oxide (MAG-OX) 400 MG tablet Take 2 tablets by mouth 2 (two) times daily.   methocarbamol (ROBAXIN) 500 MG tablet Take 500 mg by mouth every 8 (eight) hours as needed.   metoprolol  succinate (TOPROL -XL) 50 MG 24 hr tablet Take 1 tablet by mouth twice daily   mycophenolate (MYFORTIC) 180 MG EC tablet Take 180 mg by mouth in the morning, at noon, and at bedtime.   omeprazole (PRILOSEC) 20 MG capsule  Take 20 mg by mouth 2 (two) times daily. (Patient taking differently: Take 40 mg by mouth daily.)   ondansetron  (ZOFRAN -ODT) 8 MG disintegrating tablet Take 1 tablet (8 mg total) by mouth every 8 (eight) hours as needed for nausea or vomiting.   predniSONE  (DELTASONE ) 5 MG tablet Take 5 mg by mouth daily.   promethazine -dextromethorphan (PROMETHAZINE -DM) 6.25-15 MG/5ML syrup Take 2.5 mLs by mouth 3 (three) times daily as needed for cough.   simethicone (MYLICON) 80 MG chewable tablet Chew 80 mg by mouth every 6 (six) hours as needed for flatulence.   tiZANidine (ZANAFLEX) 2 MG tablet Take by mouth.   TYLENOL  500 MG tablet Take 1,000 mg by mouth 3 (three) times daily as needed.     Allergies  Allergen Reactions   Peanut Oil Anaphylaxis, Itching and Swelling    Tree nuts   Shellfish Protein-Containing Drug Products     Lips and thraot swell   Iodine Itching and Swelling    Shellfish-lips swell itching.   Peanut-Containing Drug Products Itching and Swelling   Shellfish Allergy Itching and Swelling    Social History   Socioeconomic History   Marital status: Single    Spouse name: Not on file   Number of children: Not on file   Years of education: Not on file   Highest education level: Not on file  Occupational History   Not on file  Tobacco Use   Smoking status: Never   Smokeless tobacco: Never  Vaping Use   Vaping status: Never Used  Substance and Sexual Activity   Alcohol use: No   Drug use: No   Sexual activity: Not Currently    Birth control/protection: Surgical  Other Topics Concern   Not on file  Social History Narrative   Not on file   Social Drivers of Health   Financial Resource Strain: Not on file  Food Insecurity: Medium Risk (03/05/2024)   Received from [facility]   Hunger Vital Sign    Within the past 12 months, you worried that your food would run out before you got money to buy more: Sometimes true    Within the past 12 months, the food you bought  just didn't last and you didn't have money to get more. : Sometimes true  Transportation Needs: No Transportation Needs (03/05/2024)   Received from [facility]    In the past 12 months, has lack of reliable transportation kept you from medical appointments, meetings, work or from getting things needed for daily living? : No  Physical Activity: Not on file  Stress: Not on file  Social Connections: Not on file  Intimate Partner Violence: Not on file     Review of Systems: General: negative for chills, fever, night sweats or weight changes.  Cardiovascular: negative for chest pain, dyspnea on exertion, edema, orthopnea, palpitations,  paroxysmal nocturnal dyspnea or shortness of breath Dermatological: negative for rash Respiratory: negative for cough or wheezing Urologic: negative for hematuria Abdominal: negative for nausea, vomiting, diarrhea, bright red blood per rectum, melena, or hematemesis Neurologic: negative for visual changes, syncope, or dizziness All other systems reviewed and are otherwise negative except as noted above.    Blood pressure (!) 140/80, pulse 64, height 5' 3 (1.6 m), weight 180 lb (81.6 kg), last menstrual period 06/26/2011, SpO2 97%.  General appearance: alert and no distress Neck: no adenopathy, no carotid bruit, no JVD, supple, symmetrical, trachea midline, and thyroid  not enlarged, symmetric, no tenderness/mass/nodules Lungs: clear to auscultation bilaterally Heart: regular rate and rhythm, S1, S2 normal, no murmur, click, rub or gallop Extremities: extremities normal, atraumatic, no cyanosis or edema Pulses: 2+ and symmetric Skin: Skin color, texture, turgor normal. No rashes or lesions Neurologic: Grossly normal  EKG not performed today      ASSESSMENT AND PLAN:   Hypertension History of essential hypertension with blood pressure measured today at 140/80.  She is on amlodipine  5 mg a day, and Toprol .  She does complain of some  mild ankle edema and I told her that she could try cutting her amlodipine  in half to 2.5 mg and which may improve her edema but to monitor her blood pressure carefully.  Hyperlipidemia History of hyperlipidemia on atorvastatin  and intermittently on Zetia  depending on symptoms.  She is followed by Dr. Mona in the lipid clinic.  Her most recent lipid profile performed 05/07/2024 revealed total cholesterol 172, LDL of 99 and HDL of 62.  Palpitations History of palpitations in the past found to be PVCs on event monitoring very sensitive to caffeine intake.  She currently denies palpitations.     Dorn DOROTHA Lesches MD FACP,FACC,FAHA, [facility] 06/30/2024 9:40 AM

## 2024-06-30 NOTE — Assessment & Plan Note (Signed)
 History of hyperlipidemia on atorvastatin  and intermittently on Zetia  depending on symptoms.  She is followed by Dr. Mona in the lipid clinic.  Her most recent lipid profile performed 05/07/2024 revealed total cholesterol 172, LDL of 99 and HDL of 62.

## 2024-06-30 NOTE — Patient Instructions (Signed)

## 2024-06-30 NOTE — Assessment & Plan Note (Signed)
 History of essential hypertension with blood pressure measured today at 140/80.  She is on amlodipine  5 mg a day, and Toprol .  She does complain of some mild ankle edema and I told her that she could try cutting her amlodipine  in half to 2.5 mg and which may improve her edema but to monitor her blood pressure carefully.

## 2024-08-29 ENCOUNTER — Ambulatory Visit (HOSPITAL_BASED_OUTPATIENT_CLINIC_OR_DEPARTMENT_OTHER)
